# Patient Record
Sex: Male | Born: 1935 | Race: White | Hispanic: No | Marital: Married | Smoking: Former smoker
Health system: Southern US, Community
[De-identification: ages and names within clinical notes are randomized; demographics above are authoritative.]

## PROBLEM LIST (undated history)

## (undated) DIAGNOSIS — I350 Nonrheumatic aortic (valve) stenosis: Secondary | ICD-10-CM

## (undated) DIAGNOSIS — I2510 Atherosclerotic heart disease of native coronary artery without angina pectoris: Secondary | ICD-10-CM

## (undated) DIAGNOSIS — R011 Cardiac murmur, unspecified: Secondary | ICD-10-CM

## (undated) DIAGNOSIS — I10 Essential (primary) hypertension: Secondary | ICD-10-CM

## (undated) DIAGNOSIS — E785 Hyperlipidemia, unspecified: Secondary | ICD-10-CM

## (undated) DIAGNOSIS — I352 Nonrheumatic aortic (valve) stenosis with insufficiency: Secondary | ICD-10-CM

## (undated) DIAGNOSIS — R413 Other amnesia: Secondary | ICD-10-CM

## (undated) HISTORY — DX: Hyperlipidemia, unspecified: E78.5

## (undated) HISTORY — PX: CORONARY ANGIOPLASTY: SHX604

## (undated) HISTORY — DX: Cardiac murmur, unspecified: R01.1

## (undated) HISTORY — PX: CARDIAC CATHETERIZATION: SHX172

## (undated) HISTORY — DX: Nonrheumatic aortic (valve) stenosis with insufficiency: I35.2

## (undated) HISTORY — DX: Essential (primary) hypertension: I10

## (undated) HISTORY — PX: TONSILLECTOMY: SUR1361

## (undated) HISTORY — DX: Atherosclerotic heart disease of native coronary artery without angina pectoris: I25.10

## (undated) HISTORY — DX: Nonrheumatic aortic (valve) stenosis: I35.0

## (undated) HISTORY — DX: Other amnesia: R41.3

---

## 1998-03-23 ENCOUNTER — Encounter: Payer: Self-pay | Admitting: Emergency Medicine

## 1998-03-23 ENCOUNTER — Emergency Department (HOSPITAL_COMMUNITY): Admission: EM | Admit: 1998-03-23 | Discharge: 1998-03-23 | Payer: Self-pay | Admitting: Emergency Medicine

## 2002-07-22 ENCOUNTER — Encounter: Payer: Self-pay | Admitting: Orthopedic Surgery

## 2002-07-22 ENCOUNTER — Observation Stay (HOSPITAL_COMMUNITY): Admission: RE | Admit: 2002-07-22 | Discharge: 2002-07-23 | Payer: Self-pay | Admitting: Orthopedic Surgery

## 2002-09-04 ENCOUNTER — Ambulatory Visit (HOSPITAL_COMMUNITY): Admission: RE | Admit: 2002-09-04 | Discharge: 2002-09-04 | Payer: Self-pay | Admitting: Gastroenterology

## 2004-12-15 ENCOUNTER — Ambulatory Visit: Payer: Self-pay | Admitting: Pulmonary Disease

## 2004-12-30 ENCOUNTER — Ambulatory Visit: Payer: Self-pay | Admitting: Pulmonary Disease

## 2005-11-23 ENCOUNTER — Ambulatory Visit: Payer: Self-pay | Admitting: Pulmonary Disease

## 2007-02-13 ENCOUNTER — Encounter: Payer: Self-pay | Admitting: Pulmonary Disease

## 2007-05-23 ENCOUNTER — Encounter: Payer: Self-pay | Admitting: Pulmonary Disease

## 2007-08-13 ENCOUNTER — Encounter: Payer: Self-pay | Admitting: Pulmonary Disease

## 2007-10-17 ENCOUNTER — Encounter: Payer: Self-pay | Admitting: Pulmonary Disease

## 2007-11-21 ENCOUNTER — Encounter: Payer: Self-pay | Admitting: Pulmonary Disease

## 2008-02-11 ENCOUNTER — Encounter: Payer: Self-pay | Admitting: Pulmonary Disease

## 2008-02-14 ENCOUNTER — Encounter: Payer: Self-pay | Admitting: Pulmonary Disease

## 2008-02-27 ENCOUNTER — Ambulatory Visit: Payer: Self-pay | Admitting: Pulmonary Disease

## 2008-11-20 ENCOUNTER — Encounter: Admission: RE | Admit: 2008-11-20 | Discharge: 2008-11-20 | Payer: Self-pay | Admitting: Cardiology

## 2008-11-20 ENCOUNTER — Encounter: Payer: Self-pay | Admitting: Pulmonary Disease

## 2008-11-28 ENCOUNTER — Inpatient Hospital Stay (HOSPITAL_BASED_OUTPATIENT_CLINIC_OR_DEPARTMENT_OTHER): Admission: RE | Admit: 2008-11-28 | Discharge: 2008-11-28 | Payer: Self-pay | Admitting: Cardiology

## 2008-12-05 ENCOUNTER — Ambulatory Visit: Payer: Self-pay | Admitting: Thoracic Surgery (Cardiothoracic Vascular Surgery)

## 2008-12-15 ENCOUNTER — Ambulatory Visit: Payer: Self-pay | Admitting: Thoracic Surgery (Cardiothoracic Vascular Surgery)

## 2008-12-15 ENCOUNTER — Ambulatory Visit (HOSPITAL_COMMUNITY)
Admission: RE | Admit: 2008-12-15 | Discharge: 2008-12-15 | Payer: Self-pay | Admitting: Thoracic Surgery (Cardiothoracic Vascular Surgery)

## 2008-12-15 ENCOUNTER — Encounter: Payer: Self-pay | Admitting: Thoracic Surgery (Cardiothoracic Vascular Surgery)

## 2008-12-17 ENCOUNTER — Ambulatory Visit: Payer: Self-pay | Admitting: Thoracic Surgery (Cardiothoracic Vascular Surgery)

## 2008-12-17 ENCOUNTER — Encounter: Payer: Self-pay | Admitting: Thoracic Surgery (Cardiothoracic Vascular Surgery)

## 2008-12-17 ENCOUNTER — Inpatient Hospital Stay (HOSPITAL_COMMUNITY)
Admission: RE | Admit: 2008-12-17 | Discharge: 2008-12-21 | Payer: Self-pay | Admitting: Thoracic Surgery (Cardiothoracic Vascular Surgery)

## 2008-12-17 HISTORY — PX: AORTIC VALVE REPLACEMENT: SHX41

## 2009-01-12 ENCOUNTER — Encounter
Admission: RE | Admit: 2009-01-12 | Discharge: 2009-01-12 | Payer: Self-pay | Admitting: Thoracic Surgery (Cardiothoracic Vascular Surgery)

## 2009-01-12 ENCOUNTER — Ambulatory Visit: Payer: Self-pay | Admitting: Thoracic Surgery (Cardiothoracic Vascular Surgery)

## 2009-01-31 ENCOUNTER — Encounter (HOSPITAL_COMMUNITY): Admission: RE | Admit: 2009-01-31 | Discharge: 2009-05-01 | Payer: Self-pay | Admitting: Cardiology

## 2009-02-02 ENCOUNTER — Encounter: Payer: Self-pay | Admitting: Pulmonary Disease

## 2009-03-05 ENCOUNTER — Encounter: Payer: Self-pay | Admitting: Pulmonary Disease

## 2009-05-02 ENCOUNTER — Encounter (HOSPITAL_COMMUNITY): Admission: RE | Admit: 2009-05-02 | Discharge: 2009-06-01 | Payer: Self-pay | Admitting: Cardiology

## 2009-05-11 ENCOUNTER — Encounter: Payer: Self-pay | Admitting: Pulmonary Disease

## 2009-05-25 ENCOUNTER — Ambulatory Visit: Payer: Self-pay | Admitting: Thoracic Surgery (Cardiothoracic Vascular Surgery)

## 2009-05-25 ENCOUNTER — Encounter: Payer: Self-pay | Admitting: Pulmonary Disease

## 2009-05-25 ENCOUNTER — Encounter
Admission: RE | Admit: 2009-05-25 | Discharge: 2009-05-25 | Payer: Self-pay | Admitting: Thoracic Surgery (Cardiothoracic Vascular Surgery)

## 2009-07-28 ENCOUNTER — Encounter: Payer: Self-pay | Admitting: Pulmonary Disease

## 2009-12-04 ENCOUNTER — Ambulatory Visit: Payer: Self-pay | Admitting: Cardiology

## 2009-12-14 ENCOUNTER — Ambulatory Visit: Payer: Self-pay | Admitting: Cardiology

## 2010-03-04 ENCOUNTER — Encounter: Payer: Self-pay | Admitting: Pulmonary Disease

## 2010-03-04 NOTE — Letter (Signed)
Summary: [facility]  [facility]   Imported By: Sherian Rein 02/11/2009 14:51:24  _____________________________________________________________________  External Attachment:    Type:   Image     Comment:   External Document

## 2010-03-04 NOTE — Letter (Signed)
Summary: [facility]   Imported By: Lennie Odor 08/06/2009 14:48:40  _____________________________________________________________________  External Attachment:    Type:   Image     Comment:   External Document

## 2010-03-04 NOTE — Letter (Signed)
Summary: [facility]   Imported By: Lennie Odor 05/26/2009 14:41:20  _____________________________________________________________________  External Attachment:    Type:   Image     Comment:   External Document

## 2010-03-04 NOTE — Letter (Signed)
Summary: [facility]   Imported By: Sherian Rein 03/13/2009 12:36:04  _____________________________________________________________________  External Attachment:    Type:   Image     Comment:   External Document

## 2010-03-04 NOTE — Letter (Signed)
Summary: [facility]  [facility]   Imported By: Sherian Rein 06/05/2009 08:24:36  _____________________________________________________________________  External Attachment:    Type:   Image     Comment:   External Document

## 2010-03-24 NOTE — Consult Note (Signed)
Summary: [facility]   Imported By: Sherian Rein 03/15/2010 14:21:42  _____________________________________________________________________  External Attachment:    Type:   Image     Comment:   External Document

## 2010-03-24 NOTE — Letter (Signed)
Summary: [facility]   Imported By: Sherian Rein 03/15/2010 14:06:40  _____________________________________________________________________  External Attachment:    Type:   Image     Comment:   External Document

## 2010-04-22 ENCOUNTER — Other Ambulatory Visit: Payer: Self-pay | Admitting: *Deleted

## 2010-04-22 DIAGNOSIS — I350 Nonrheumatic aortic (valve) stenosis: Secondary | ICD-10-CM

## 2010-04-22 MED ORDER — METOPROLOL TARTRATE 25 MG PO TABS: ORAL_TABLET | ORAL | Status: DC

## 2010-04-22 NOTE — Telephone Encounter (Signed)
Refilled meds per fax request.  

## 2010-05-05 LAB — CBC
HCT: 25.2 % — ABNORMAL LOW (ref 39.0–52.0)
HCT: 31.8 % — ABNORMAL LOW (ref 39.0–52.0)
Hemoglobin: 11.1 g/dL — ABNORMAL LOW (ref 13.0–17.0)
Hemoglobin: 8.8 g/dL — ABNORMAL LOW (ref 13.0–17.0)
Hemoglobin: 9 g/dL — ABNORMAL LOW (ref 13.0–17.0)
MCHC: 34.7 g/dL (ref 30.0–36.0)
MCHC: 35 g/dL (ref 30.0–36.0)
MCHC: 35.3 g/dL (ref 30.0–36.0)
MCV: 100 fL (ref 78.0–100.0)
MCV: 98.2 fL (ref 78.0–100.0)
MCV: 98.3 fL (ref 78.0–100.0)
MCV: 98.4 fL (ref 78.0–100.0)
Platelets: 139 10*3/uL — ABNORMAL LOW (ref 150–400)
Platelets: 92 10*3/uL — ABNORMAL LOW (ref 150–400)
Platelets: 132 10*3/uL — ABNORMAL LOW (ref 150–400)
RBC: 2.52 MIL/uL — ABNORMAL LOW (ref 4.22–5.81)
RBC: 2.59 MIL/uL — ABNORMAL LOW (ref 4.22–5.81)
RBC: 2.77 MIL/uL — ABNORMAL LOW (ref 4.22–5.81)
RBC: 3.23 MIL/uL — ABNORMAL LOW (ref 4.22–5.81)
RDW: 12.8 % (ref 11.5–15.5)
RDW: 13.1 % (ref 11.5–15.5)
RDW: 13.4 % (ref 11.5–15.5)
RDW: 13.6 % (ref 11.5–15.5)
RDW: 12.8 % (ref 11.5–15.5)
WBC: 11.5 10*3/uL — ABNORMAL HIGH (ref 4.0–10.5)
WBC: 12.7 10*3/uL — ABNORMAL HIGH (ref 4.0–10.5)
WBC: 9.5 10*3/uL (ref 4.0–10.5)
WBC: 8 10*3/uL (ref 4.0–10.5)

## 2010-05-05 LAB — TYPE AND SCREEN: Antibody Screen: NEGATIVE

## 2010-05-05 LAB — MAGNESIUM: Magnesium: 2.6 mg/dL — ABNORMAL HIGH (ref 1.5–2.5)

## 2010-05-05 LAB — POCT I-STAT 4, (NA,K, GLUC, HGB,HCT)
Glucose, Bld: 109 mg/dL — ABNORMAL HIGH (ref 70–99)
Glucose, Bld: 119 mg/dL — ABNORMAL HIGH (ref 70–99)
Glucose, Bld: 99 mg/dL (ref 70–99)
HCT: 31 % — ABNORMAL LOW (ref 39.0–52.0)
Hemoglobin: 11.2 g/dL — ABNORMAL LOW (ref 13.0–17.0)
Hemoglobin: 7.8 g/dL — ABNORMAL LOW (ref 13.0–17.0)
Hemoglobin: 9.5 g/dL — ABNORMAL LOW (ref 13.0–17.0)
Potassium: 3.2 mEq/L — ABNORMAL LOW (ref 3.5–5.1)
Potassium: 4 mEq/L (ref 3.5–5.1)
Potassium: 5.9 mEq/L — ABNORMAL HIGH (ref 3.5–5.1)
Sodium: 134 mEq/L — ABNORMAL LOW (ref 135–145)
Sodium: 134 mEq/L — ABNORMAL LOW (ref 135–145)
Sodium: 140 mEq/L (ref 135–145)
Sodium: 141 mEq/L (ref 135–145)
Sodium: 143 mEq/L (ref 135–145)

## 2010-05-05 LAB — BLOOD GAS, ARTERIAL
Drawn by: 313941
FIO2: 0.21 %
Patient temperature: 98.6
TCO2: 28.6 mmol/L (ref 0–100)
pCO2 arterial: 42.8 mmHg (ref 35.0–45.0)
pH, Arterial: 7.42 (ref 7.350–7.450)

## 2010-05-05 LAB — BASIC METABOLIC PANEL
BUN: 18 mg/dL (ref 6–23)
CO2: 29 mEq/L (ref 19–32)
Calcium: 8.2 mg/dL — ABNORMAL LOW (ref 8.4–10.5)
Calcium: 8.5 mg/dL (ref 8.4–10.5)
Calcium: 8.6 mg/dL (ref 8.4–10.5)
Chloride: 112 mEq/L (ref 96–112)
Creatinine, Ser: 0.96 mg/dL (ref 0.4–1.5)
Creatinine, Ser: 1.11 mg/dL (ref 0.4–1.5)
Creatinine, Ser: 1.17 mg/dL (ref 0.4–1.5)
GFR calc Af Amer: >60 mL/min (ref >60)
GFR calc Af Amer: >60 mL/min (ref >60)
GFR calc non Af Amer: >60 mL/min (ref >60)
GFR calc non Af Amer: >60 mL/min (ref >60)
Glucose, Bld: 142 mg/dL — ABNORMAL HIGH (ref 70–99)
Sodium: 137 mEq/L (ref 135–145)

## 2010-05-05 LAB — POCT I-STAT 3, ART BLOOD GAS (G3+)
Acid-Base Excess: 1 mmol/L (ref 0.0–2.0)
Acid-Base Excess: 4 mmol/L — ABNORMAL HIGH (ref 0.0–2.0)
Bicarbonate: 24.2 mEq/L — ABNORMAL HIGH (ref 20.0–24.0)
Bicarbonate: 24.8 mEq/L — ABNORMAL HIGH (ref 20.0–24.0)
Bicarbonate: 28.4 mEq/L — ABNORMAL HIGH (ref 20.0–24.0)
O2 Saturation: 100 %
O2 Saturation: 100 %
Patient temperature: 34.3
TCO2: 25 mmol/L (ref 0–100)
TCO2: 26 mmol/L (ref 0–100)
TCO2: 30 mmol/L (ref 0–100)
pCO2 arterial: 34 mmHg — ABNORMAL LOW (ref 35.0–45.0)
pCO2 arterial: 42.2 mmHg (ref 35.0–45.0)
pH, Arterial: 7.371 (ref 7.350–7.450)
pH, Arterial: 7.494 — ABNORMAL HIGH (ref 7.350–7.450)

## 2010-05-05 LAB — PROTIME-INR
Prothrombin Time: 17.8 seconds — ABNORMAL HIGH (ref 11.6–15.2)
Prothrombin Time: 13.7 seconds (ref 11.6–15.2)

## 2010-05-05 LAB — GLUCOSE, CAPILLARY
Glucose-Capillary: 111 mg/dL — ABNORMAL HIGH (ref 70–99)
Glucose-Capillary: 124 mg/dL — ABNORMAL HIGH (ref 70–99)
Glucose-Capillary: 125 mg/dL — ABNORMAL HIGH (ref 70–99)
Glucose-Capillary: 93 mg/dL (ref 70–99)

## 2010-05-05 LAB — URINALYSIS, ROUTINE W REFLEX MICROSCOPIC
Glucose, UA: NEGATIVE mg/dL
Protein, ur: NEGATIVE mg/dL
Specific Gravity, Urine: 1.008 (ref 1.005–1.030)
Urobilinogen, UA: 0.2 mg/dL (ref 0.0–1.0)

## 2010-05-05 LAB — PREPARE FRESH FROZEN PLASMA

## 2010-05-05 LAB — COMPREHENSIVE METABOLIC PANEL
AST: 23 U/L (ref 0–37)
Albumin: 4 g/dL (ref 3.5–5.2)
Alkaline Phosphatase: 57 U/L (ref 39–117)
BUN: 14 mg/dL (ref 6–23)
Chloride: 105 mEq/L (ref 96–112)
GFR calc Af Amer: >60 mL/min (ref >60)
Potassium: 4.2 mEq/L (ref 3.5–5.1)
Sodium: 137 mEq/L (ref 135–145)
Total Bilirubin: 0.9 mg/dL (ref 0.3–1.2)
Total Protein: 6.3 g/dL (ref 6.0–8.3)

## 2010-05-05 LAB — POCT I-STAT, CHEM 8
Creatinine, Ser: 0.9 mg/dL (ref 0.4–1.5)
Hemoglobin: 10.2 g/dL — ABNORMAL LOW (ref 13.0–17.0)
Potassium: 4.5 mEq/L (ref 3.5–5.1)
Sodium: 141 mEq/L (ref 135–145)

## 2010-05-05 LAB — ABO/RH: ABO/RH(D): O POS

## 2010-05-05 LAB — HEMOGLOBIN AND HEMATOCRIT, BLOOD: HCT: 24.8 % — ABNORMAL LOW (ref 39.0–52.0)

## 2010-05-05 LAB — PREPARE PLATELETS

## 2010-05-05 LAB — APTT: aPTT: 45 seconds — ABNORMAL HIGH (ref 24–37)

## 2010-05-05 LAB — PLATELET COUNT: Platelets: 90 10*3/uL — ABNORMAL LOW (ref 150–400)

## 2010-05-06 LAB — POCT I-STAT 3, VENOUS BLOOD GAS (G3P V)
Bicarbonate: 27.1 mEq/L — ABNORMAL HIGH (ref 20.0–24.0)
pH, Ven: 7.377 — ABNORMAL HIGH (ref 7.250–7.300)
pO2, Ven: 38 mmHg (ref 30.0–45.0)

## 2010-05-06 LAB — POCT I-STAT 3, ART BLOOD GAS (G3+)
Acid-Base Excess: 4 mmol/L — ABNORMAL HIGH (ref 0.0–2.0)
pH, Arterial: 7.428 (ref 7.350–7.450)

## 2010-05-31 ENCOUNTER — Other Ambulatory Visit: Payer: Self-pay | Admitting: Dermatology

## 2010-06-14 ENCOUNTER — Other Ambulatory Visit: Payer: Self-pay | Admitting: Cardiology

## 2010-06-14 DIAGNOSIS — E7800 Pure hypercholesterolemia, unspecified: Secondary | ICD-10-CM

## 2010-06-14 NOTE — Telephone Encounter (Signed)
escribe request  

## 2010-06-15 NOTE — Assessment & Plan Note (Signed)
OFFICE VISIT   Marvin Wagner, Marvin Wagner  DOB:  Mar 01, 1930                                        May 25, 2009  CHART #:  [id number]   HISTORY:  The patient returns to the office today for follow up status  post aortic valve replacement and coronary artery bypass grafting x2 on  December 17, 2008.  He was last seen here in our office on January 12, 2009.  Since then, he has continued to do remarkably well.  He has  completed the cardiac rehab program and reports excellent progression in  his exercise tolerance.  He states that he still does not feel quite as  strong as he did protect, particularly with respect to upper body  strength, but otherwise he feels terrific and is doing very well.  He  has no limitations whatsoever.  He has never had much trouble with any  pain in his chest, and he is back now enjoying essentially normal  physical activity.  He continues to follow up routinely with Dr.  Patty Sermons and he is scheduled to see him later this summer, at which  time a followup echocardiogram has been scheduled.  He has no  complaints.  The remainder of his review of systems is unremarkable and  notable for the absence of any exertional shortness of breath, chest  pain, tachy palpitations, or dizzy spells.  The remainder of his past  medical history is unchanged.   CURRENT MEDICATIONS:  Aspirin, Lipitor, metoprolol, multivitamin,  calcium and magnesium supplement, vitamin D, vitamin C.   PHYSICAL EXAMINATION:  Notable for a well-appearing gentleman with blood  pressure 115/62, pulse 59 and regular, oxygen saturation 95% on room  air.  Examination of the chest reveals median sternotomy scar that has  healed completely.  The sternum is stable on palpation.  Auscultation  reveals clear breath sounds that are symmetrical bilaterally.  No  wheezes, rales, or rhonchi are noted.  Cardiovascular exam is notable  for regular rate and rhythm.  No murmurs, rubs, or  gallops are  appreciated.  The abdomen is soft, nontender.  The extremities are warm  and well perfused.  There is no lower extremity edema.   IMPRESSION:  The patient had done quite well following aortic valve  replacement and coronary artery bypass grafting.   PLAN:  In the future, the patient will call and return to see [place] as  needed.  All of his questions have been addressed.   Salvatore Decent. Cornelius Moras, M.D.  Electronically Signed   CHO/MEDQ  D:  05/25/2009  T:  05/26/2009  Job:  [id number]   cc:   Cassell Clement, M.D.  Peter M. Swaziland, M.D.  Lonzo Cloud. Kriste Basque, MD

## 2010-06-15 NOTE — Assessment & Plan Note (Signed)
OFFICE VISIT   HAYVEN, FATIMA  DOB:  [birth year]                                        December 15, 2008  CHART #:  [id number]   The patient returns to the office today for further followup with  tentative plans to proceed with elective aortic valve replacement and  coronary artery bypass grafting on Wednesday, December 17, 2008.  He was  originally seen in consultation on December 05, 2008, and a full history  and physical exam and consultation report were dictated at that time.  Since then, he has remained entirely stable clinically and he is now  eager to proceed with surgery.  We again reviewed the indications,  risks, and potential benefits of surgery.  All of his questions have  been addressed.  We plan for surgery first case Wednesday morning.   Salvatore Decent. Cornelius Moras, M.D.  Electronically Signed   CHO/MEDQ  D:  12/15/2008  T:  12/16/2008  Job:  [id number]

## 2010-06-15 NOTE — H&P (Signed)
HISTORY AND PHYSICAL EXAMINATION   December 05, 2008   Re:  Marvin Wagner, Marvin Wagner           DOB:  February 09, 1930   REASON FOR CONSULTATION:  Severe aortic stenosis.   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  He states  that a heart murmur was first noted on physical exam in 1986 when he  underwent routine physical exam at the time of his discharge from the [employer].  Ultimately, he relocated to [place], [place], and  for the last 12 years or so he has been followed by Dr. Ronny Flurry.  The patient has remained otherwise remarkably healthy and physically  active all of his life.  He does admit that over the last year or so he  may have noticed a very slight decrease in his exercise tolerance with  tendency towards mild exertional shortness of breath when he pushes  himself strenuously.  Approximately 2 months ago, while exercising on a  stationary bike, he states that his pulse rate became rapid and  persisted for several hours, to the point where he felt poorly and tired  the rest of the day.  He returned to see Dr. Patty Sermons in followup and  underwent a repeat 2-D echocardiogram on November 20, 2008.  This reveals  severe aortic stenosis with peak and mean transvalvular gradients  estimated 140 and 88 mmHg respectively.  The peak velocity across the  valve was measured 5.92 meters per second with estimated aortic valve  area 0.63 cm2.  There remained normal left ventricular systolic function  with moderate left ventricular hypertrophy.  There was moderate aortic  regurgitation.  During the last year, there was significant increase in  the peak velocity across the aortic valve appreciated.  The patient  subsequently underwent elective left and right heart catheterization by  Dr. Peter Swaziland on November 28, 2008.  This confirmed the presence of  severe aortic stenosis  with mean and peak-to-peak gradient across the  valve measured 58 and 82 mmHg respectively.  The aortic valve area was  estimated 0.42 cm2 based upon thermodilution cardiac output of 2.7 L per  minute.  Cardiac output was estimated 5.2 L per minute using the Fick  method, and aortic valve area was subsequently estimated 0.8 cm2.  Coronary arteriography demonstrated the presence of significant single-  vessel coronary artery disease with 75% stenosis of the proximal left  anterior descending coronary artery involving bifurcation with a large  diagonal branch.  There is normal left ventricular systolic function.  Pulmonary artery pressures were measured 19/6 with pulmonary capillary  wedge pressure 7.  The patient has been referred to consider elective  aortic valve replacement and coronary artery bypass grafting.   REVIEW OF SYSTEMS:  GENERAL:  The patient reports normal appetite.  He  has not been gaining nor losing weight recently.  VITAL SIGNS:  He is 5 feet 10 inches tall and weighs approximately 162  pounds.  CARDIAC:  The patient describes any history of chest pain, chest  tightness, chest pressure either with activity or at rest.  The patient  exercises regularly and has really only noticed a very slight decrease  in his exercise tolerance over the last year.  He denies PND, orthopnea,  or lower extremity edema.  He has not had palpitations or syncope.  RESPIRATORY:  Negative.  The patient denies productive cough,  hemoptysis, or wheezing.  GASTROINTESTINAL:  Negative.  The patient has no difficulty swallowing.  He denies hematochezia, hematemesis, or melena. Bowel function is  regular.  GENITOURINARY:  Negative.  MUSCULOSKELETAL:  Negative.  NEUROLOGIC:  Negative.  HEENT:  Negative.  The patient has good dentition and sees his dentist  on a regular basis.  HEMATOLOGIC:  Negative.   PAST MEDICAL HISTORY:  1. Aortic stenosis.  2. Paroxysmal supraventricular tachycardia.  3.  Diverticulosis.  4. Mitral valve prolapse.  5. Spinal stenosis.   PAST SURGICAL HISTORY:  1. Tonsillectomy at age 40.  2. Back surgery for ruptured disk, L4-5 level in 2004.   FAMILY HISTORY:  Noncontributory.   SOCIAL HISTORY:  The patient is married and lives with his wife here in  [place].  They have 2 grown children and several grandchildren.  The  patient has a remote history of tobacco use, but he quit smoking in  1962.  He denies alcohol consumption.  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  He remains quite active physically and has no  significant limitations.   CURRENT MEDICATIONS:  1. Multivitamin daily.  2. Aspirin 81 mg daily.  3. Lipitor 10 mg daily.  4. Calcium 2 tablets daily.  5. Vitamin D 2000 units daily.  6. Vitamin C 500 mg daily.  7. Psyllium fiber tablets daily.   DRUG ALLERGIES:  Penicillin.   PHYSICAL EXAMINATION:  General:  The patient is a well appearing, thin,  white male who appears somewhat younger than stated age, in no acute  distress.  Vital Signs:  Blood pressure 127/64, pulse 66, and oxygen  saturation 98% on room air.  HEENT:  Unrevealing.  Neck:  Supple.  There  is no cervical nor supraclavicular lymphadenopathy.  There is no jugular  venous distention.  No carotid bruits noted.  Auscultation of the chest  demonstrates clear breath sounds which are symmetrical bilaterally.  No  wheezes or rhonchi are noted.  Cardiovascular:  Notable for regular rate  and rhythm.  There is crescendo-decrescendo grade 4/6 systolic murmur  heard best along the sternal border with radiation up towards the neck  and all across the precordium.  No diastolic murmurs are noted.  Abdomen:  Soft, nondistended, and nontender.  Bowel sounds are present.  Extremities: Warm and well perfused.  There is no lower extremity edema.  There is no sign of venous insufficiency.  Pulses are intact and  palpable bilaterally.  Rectal and GU:  Both deferred.  Neurologic:   Grossly nonfocal and symmetrical throughout.   DIAGNOSTIC TESTS:  Report of 2-D echocardiogram performed at [facility] on November 20, 2008 is reviewed.  By report, this  exam demonstrated the presence of severe aortic stenosis with a peak  velocity across the valve measured 5.92 meters per second.  The peak and  mean transvalvular gradients were 140 and 88 mmHg respectively.  There  is normal left ventricular systolic function with moderate left  ventricular hypertrophy.  There is evidence for only trace mitral  regurgitation.  No other significant abnormalities are noted other than  moderate aortic insufficiency.   Left and right heart catheterization performed by Dr. Peter Swaziland on  November 28, 2008, is reviewed.  This confirms the presence of severe  aortic stenosis with hemodynamic data as discussed previously.  In  addition, there is single-vessel coronary artery disease with a shelf-  like plaque and 70-80% stenosis of the proximal left anterior descending  coronary  artery arriving at takeoff of a large diagonal branch.  There  are otherwise no significant flow-limiting lesions in the coronary  arteries.  There is codominant coronary circulation.  Left ventricular  systolic function appears normal.  There is some mitral regurgitation on  left ventricular ejection.   IMPRESSION:  Severe aortic stenosis with single-vessel coronary artery  disease.  The patient has had very few symptoms, although he does  describe mild exertional fatigue and shortness of breath that has  developed over the last year.  In addition, he had an episode of  prolonged tachy palpitation associated with feeling poorly a couple of  months ago.  By both echo and cath, he has extremely severe aortic  stenosis.  I think it makes sense to proceed with surgery at this  juncture.   PLAN:  I have discussed the options at length with the patient and his  wife here in the office today.   Alternative treatment strategies have  been discussed.  The rationale for proceeding with surgery at this  juncture versus continuing medical therapy has been discussed in detail.  Because of the presence of significant coronary artery disease, I do not  favor use of minimally invasive approach for his surgery.  We have also  discussed alternatives with respect to replacing his aortic valve, and  after considerable discussion, the patient agrees that he would probably  best be treated with bioprosthetic tissue valve.  He understands that  this will come with a small risk for late structural valve deterioration  and failure depending upon his longevity.  However, based upon his age,  I suspect that a tissue valve clearly makes a sense under the  circumstances.  All of his questions have been addressed.  They  understand and accept all potential associated risks of surgery  including, but not limited to risk of death, stroke, myocardial  infarction, congestive heart failure, respiratory failure, pneumonia,  bleeding requiring blood transfusion, arrhythmia, heart block with  bradycardia requiring permanent pacemaker, late complications related to  valve replacement, and late recurrence of coronary artery disease.  All  of his questions have been addressed.  We tentatively plan to proceed  with surgery on Wednesday, December 17, 2008.  The patient and his wife  will return for further followup in consultation on Monday, December 15, 2008 prior to surgery.   Salvatore Decent. Cornelius Moras, M.D.  Electronically Signed   CHO/MEDQ  D:  12/05/2008  T:  12/06/2008  Job:  [id number]   cc:   Cassell Clement, M.D.  Peter M. Swaziland, M.D.

## 2010-06-15 NOTE — Assessment & Plan Note (Signed)
OFFICE VISIT   Marvin Wagner, Marvin Wagner  DOB:  [birth year]                                        January 12, 2009  CHART #:  [id number]   HISTORY:  The patient is a 77 year old gentleman who is seen in routine  office visit following his aortic valve replacement and coronary artery  bypass grafting x2 by Dr. Cornelius Moras on December 17, 2008, for severe aortic  stenosis as well as single-vessel coronary artery disease.  Currently,  he reports that he is overall feeling well.  He does have some  difficulty with his appetite, but he states his weight is holding steady  and he is using some nutritional supplements.  Mostly, he just feels  that things do not taste very good.  He denies shortness of breath or  chest pain.  He denies dizziness or syncopal episodes.  He has been  increasing his activity, primarily walking around the house.  He is  tentatively scheduled to start the cardiac rehab program soon, but he is  uncertain if you wants to do this.  He will go to their orientation this  week.  Overall, he feels as though his progress has been pretty steady.   DIAGNOSTIC TESTS:  Chest x-ray was obtained on today's date; however,  the computer system is having some difficulty and I am currently unable  to evaluate this.   PHYSICAL EXAMINATION:  Vital Signs:  Blood pressure 127/78, pulse 92,  respirations 18, oxygen saturation is 97% on room air.  General:  Well-  developed adult male in no acute distress.  He appears younger than his  stated age.  Pulmonary:  Clear lungs throughout.  Cardiac:  Regular rate  and rhythm.  Soft systolic aortic flow murmur.  Extremities:  No edema.  Incisions are all healing well without evidence of infection.   ASSESSMENT:  The patient was making excellent ongoing progress.  We  discussed resumption of driving and parameters for this.  I also  encouraged him to attend the cardiac rehab program  and he is considering this as described above.   We will see him again in  the office in 4 months and p.r.n.   Rowe Clack, P.A.-C.   Marvin Wagner  D:  01/12/2009  T:  01/13/2009  Job:  [id number]   cc:   Cassell Clement, M.D.  Peter M. Swaziland, M.D.

## 2010-06-18 NOTE — Op Note (Signed)
NAME:  BRENNER, VISCONTI                           ACCOUNT NO.:  [id number]   MEDICAL RECORD NO.:  [id number]                   PATIENT TYPE:  AMB   LOCATION:  DAY                                  FACILITY:  [facility]   PHYSICIAN:  Georges Lynch. Gioffre, M.D.             DATE OF BIRTH:  12-23-1930   DATE OF PROCEDURE:  07/22/2002  DATE OF DISCHARGE:                                 OPERATIVE REPORT   Mr. Anglin is entered with a chief complaint of pain in his anterior right  thigh and weakness of his hip flexors on the right.   PREOPERATIVE DIAGNOSES:  1. Spinal stenosis, L4-5 on the right.  2. Herniated disk at L4-5 on the right with a foraminal component.   POSTOPERATIVE DIAGNOSES:  1. Spinal stenosis, L4-5 on the right.  2. Herniated disk at L4-5 on the right with a foraminal component.   OPERATION:  1. Hemilaminectomy and facetectomy at L4-5 on the right.  2. Microdiskectomy at L4-5 on the right and decompression of the lateral     recess as well.   SURGEON:  Georges Lynch. Darrelyn Hillock, M.D.   ASSISTANT:  Ronnell Guadalajara, M.D.   PROCEDURE:  Under general anesthesia with the patient in a spinal frame, a  sterile prep and draping of the back carried out.  An x-ray was taken to  verify our position.  We had two needles in place.  Once we identified the  L4-5 space, an incision was made on the right paralumbar region.  The  incision was carried down to the lamina between L4 and L5, bleeders  identified and cauterized.  I then utilized the bur to bur down the inferior  part of the lamina of L4 because of the severe growth and shingling effect  of the lamina.  We took another x-ray prior to doing that to verify our  space.  Once this was done we went out and burred out laterally because the  disk was herniated out into the foramen.  He had such a marked overgrowth of  bone and stenosis that we had to utilize the bur to go out and do a partial  facetectomy as well.  Once we were out laterally, we  cauterized lateral  recess veins.  Great care was taken not to injure the L4-5 root.  We had a  nice decompression of the area, and the D'Errico retractor was used to  gently retract the dura.  We identified the root above.  We made sure we  were free from the root below as well.  A cruciate incision was made in the  posterior longitudinal ligament.  We did a complete diskectomy.  We then  went out far laterally because of the foraminal component and utilized a  nerve hook to tease another piece of disk material out from the foramen.  We  thoroughly decompressed the root.  We did a  good foraminotomy as well and we  had good motion of the nerve roots.  Thoroughly irrigated out the area and  some thrombin-soaked Gelfoam was loosely applied for hemostasis purposes.  We had good control of the bleeding.  I then closed the wound in layers in  the  usual fashion, but the superior and inferior part of the deep portions of  the wound were not closed tightly so that if we had any bleeding, we would  have good drainage.  The skin was closed with metal staples.  A sterile  Neosporin dressing was applied.  He had 1 g of IV Ancef preop.                                               Ronald A. Darrelyn Hillock, M.D.    RAG/MEDQ  D:  07/22/2002  T:  07/23/2002  Job:  [id number]

## 2010-06-25 ENCOUNTER — Other Ambulatory Visit: Payer: Self-pay | Admitting: *Deleted

## 2010-06-29 ENCOUNTER — Other Ambulatory Visit: Payer: Self-pay | Admitting: *Deleted

## 2010-06-29 DIAGNOSIS — E7800 Pure hypercholesterolemia, unspecified: Secondary | ICD-10-CM

## 2010-06-30 ENCOUNTER — Encounter: Payer: Self-pay | Admitting: Cardiology

## 2010-06-30 ENCOUNTER — Other Ambulatory Visit (INDEPENDENT_AMBULATORY_CARE_PROVIDER_SITE_OTHER): Payer: Medicare Other | Admitting: *Deleted

## 2010-06-30 DIAGNOSIS — E7800 Pure hypercholesterolemia, unspecified: Secondary | ICD-10-CM

## 2010-06-30 LAB — BASIC METABOLIC PANEL
CO2: 27 mEq/L (ref 19–32)
Chloride: 103 mEq/L (ref 96–112)
Creatinine, Ser: 1 mg/dL (ref 0.4–1.5)
Potassium: 4.4 mEq/L (ref 3.5–5.1)
Sodium: 138 mEq/L (ref 135–145)

## 2010-06-30 LAB — LIPID PANEL
LDL Cholesterol: 42 mg/dL (ref 0–99)
Total CHOL/HDL Ratio: 2
Triglycerides: 56 mg/dL (ref 0.0–149.0)

## 2010-06-30 LAB — HEPATIC FUNCTION PANEL
ALT: 22 U/L (ref 0–53)
Alkaline Phosphatase: 46 U/L (ref 39–117)
Bilirubin, Direct: 0.3 mg/dL (ref 0.0–0.3)
Total Bilirubin: 0.8 mg/dL (ref 0.3–1.2)
Total Protein: 6.4 g/dL (ref 6.0–8.3)

## 2010-07-01 ENCOUNTER — Ambulatory Visit (INDEPENDENT_AMBULATORY_CARE_PROVIDER_SITE_OTHER): Payer: Medicare Other | Admitting: Cardiology

## 2010-07-01 ENCOUNTER — Encounter: Payer: Self-pay | Admitting: Cardiology

## 2010-07-01 DIAGNOSIS — T8201XA Breakdown (mechanical) of heart valve prosthesis, initial encounter: Secondary | ICD-10-CM

## 2010-07-01 DIAGNOSIS — E785 Hyperlipidemia, unspecified: Secondary | ICD-10-CM

## 2010-07-01 DIAGNOSIS — I119 Hypertensive heart disease without heart failure: Secondary | ICD-10-CM

## 2010-07-01 DIAGNOSIS — T8209XA Other mechanical complication of heart valve prosthesis, initial encounter: Secondary | ICD-10-CM

## 2010-07-01 DIAGNOSIS — Z951 Presence of aortocoronary bypass graft: Secondary | ICD-10-CM | POA: Insufficient documentation

## 2010-07-01 DIAGNOSIS — Z9889 Other specified postprocedural states: Secondary | ICD-10-CM

## 2010-07-01 NOTE — Progress Notes (Signed)
Unknown Marvin Wagner Date of Birth:  [birth year] [facility] [address]   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  He has been feeling well since last visit.  He has a previous history of severe aortic stenosis and on 12/17/08 underwent aortic valve replacement using a #25 Edwards pericardial tissue valve.  His surgeon is Dr. Cornelius Moras.  The patient also underwent single vessel coronary bypass graft surgery at the time of his aortic valve replacement postoperatively he finished out the [facility] cardiac rehabilitation program and now exercises faithfully at home uses a stationary bike as well as free weights.  He's had no palpitations no chest pain no shortness of breath no dizziness or syncope.  A post operative echocardiogram done on 07/28/09 showed normal aortic valve prosthetic function with trivial aortic insufficiency and showed moderate LVH with normal systolic and diastolic function and normal pulmonary artery pressure.  His ejection fraction was 55-60%.  Current Outpatient Prescriptions  Medication Sig Dispense Refill  . acetaminophen (TYLENOL) 500 MG tablet Take 500 mg by mouth every 6 (six) hours as needed.        Marland Kitchen aspirin 325 MG tablet Take 325 mg by mouth daily.        . calcium carbonate (OS-CAL) 600 MG TABS Take 600 mg by mouth 2 (two) times daily with a meal.        . Cholecalciferol (VITAMIN D) 2000 UNITS tablet Take 2,000 Units by mouth daily.        Marland Kitchen FIBER PO Take by mouth 2 (two) times daily.        . IRON PO Take by mouth every other day.        Marland Kitchen LIPITOR 10 MG tablet TAKE 1 TABLET BY MOUTH DAILY  90 tablet  2  . metoprolol tartrate (LOPRESSOR) 25 MG tablet Take 1/2 tab by mouth daily   90 tablet  3  . Multiple Vitamin (MULTIVITAMIN) tablet Take 1 tablet by mouth daily.        . Protein POWD  Take by mouth daily.        . vitamin C (ASCORBIC ACID) 500 MG tablet Take 500 mg by mouth daily.          Allergies  Allergen Reactions  . Penicillins     Patient Active Problem List  Diagnoses  . Prosthetic heart valve failure  . Hx of CABG  . Hyperlipidemia  . Benign hypertensive heart disease without heart failure    History  Smoking status  . Former Smoker  . Quit date: 06/30/2006  Smokeless tobacco  . Not on file    History  Alcohol Use No    No family history on file.  Review of Systems: Constitutional: no fever chills diaphoresis or fatigue or change in weight.  Head and neck: no hearing loss, no epistaxis, no photophobia or visual disturbance. Respiratory: No cough, shortness of breath or wheezing. Cardiovascular: No chest pain peripheral edema, palpitations. Gastrointestinal: No abdominal distention, no abdominal pain, no change in bowel habits hematochezia or melena. Genitourinary: No dysuria, no frequency, no urgency, no nocturia. Musculoskeletal:No arthralgias, no back pain, no gait disturbance or myalgias. Neurological: No dizziness, no headaches, no numbness, no seizures, no syncope, no weakness, no tremors. Hematologic: No lymphadenopathy, no easy bruising. Psychiatric:  No confusion, no hallucinations, no sleep disturbance.    Physical Exam: Filed Vitals:   07/01/10 1025  BP: 120/70  Pulse: 52  The general appearance reveals a tall erect gentleman in no distress.Pupils equal and reactive.   Extraocular Movements are full.  There is no scleral icterus.  The mouth and pharynx are normal.  The neck is supple.  The carotids reveal no bruits.  The jugular venous pressure is normal.  The thyroid is not enlarged.  There is no lymphadenopathy.The chest is clear to percussion and auscultation. There are no rales or rhonchi. Expansion of the chest is symmetrical.The heart reveals a soft systolic flow murmur across the prosthetic aortic valve.The abdomen is soft  and nontender. Bowel sounds are normal. The liver and spleen are not enlarged. There Are no abdominal masses. There are no bruits.  Normal extremity without phlebitis or edema.  Pedal pulses are good.The skin is warm and dry.  There is no rash.Strength is normal and symmetrical in all extremities.  There is no lateralizing weakness.  There are no sensory deficits.   Assessment / Plan: Continue on same medication.  Recheck in 6 months for office visit and he'll come in ahead of time for fasting lipid panel hepatic function panel and Basal metabolic panel

## 2010-07-01 NOTE — Assessment & Plan Note (Signed)
The patient has a past history of a bioprosthetic aortic valve replacement on 12/17/08 for severe aortic stenosis.  Dr. Cornelius Moras was his surgeon.  The patient is doing very well postop.  He is not having any chest pain or shortness of breath.  He is very Faithful about exercising regularlyAnd he makes sure that his weight does not go up.He has not been having any dizziness or syncope.  No palpitations.

## 2010-07-01 NOTE — Assessment & Plan Note (Signed)
The patient has had no recurrence of angina pectoris since his coronary bypass graft surgery which was done at the time of his aortic valve replacement on 12/17/08

## 2010-07-01 NOTE — Assessment & Plan Note (Signed)
The patient has been watching his dietary salt intake he has not been having any problems with high blood pressure.He is tolerating his small dose of Lopressor without side effects.

## 2011-02-17 ENCOUNTER — Other Ambulatory Visit: Payer: Self-pay | Admitting: Dermatology

## 2011-02-17 DIAGNOSIS — D485 Neoplasm of uncertain behavior of skin: Secondary | ICD-10-CM | POA: Diagnosis not present

## 2011-02-17 DIAGNOSIS — Z85828 Personal history of other malignant neoplasm of skin: Secondary | ICD-10-CM | POA: Diagnosis not present

## 2011-02-17 DIAGNOSIS — L570 Actinic keratosis: Secondary | ICD-10-CM | POA: Diagnosis not present

## 2011-02-17 DIAGNOSIS — H61009 Unspecified perichondritis of external ear, unspecified ear: Secondary | ICD-10-CM | POA: Diagnosis not present

## 2011-06-03 ENCOUNTER — Telehealth: Payer: Self-pay | Admitting: Cardiology

## 2011-06-03 DIAGNOSIS — E7800 Pure hypercholesterolemia, unspecified: Secondary | ICD-10-CM

## 2011-06-03 NOTE — Telephone Encounter (Signed)
Needs labs, orders put in for visit next week

## 2011-06-03 NOTE — Telephone Encounter (Signed)
New problem:  Patient calling to see if he need lab work prior to appt on 5/10.

## 2011-06-10 ENCOUNTER — Ambulatory Visit (INDEPENDENT_AMBULATORY_CARE_PROVIDER_SITE_OTHER): Payer: Medicare Other | Admitting: Cardiology

## 2011-06-10 ENCOUNTER — Encounter: Payer: Self-pay | Admitting: Cardiology

## 2011-06-10 VITALS — BP 114/64 | HR 60 | Ht 70.0 in | Wt 159.0 lb

## 2011-06-10 DIAGNOSIS — E785 Hyperlipidemia, unspecified: Secondary | ICD-10-CM

## 2011-06-10 DIAGNOSIS — Z951 Presence of aortocoronary bypass graft: Secondary | ICD-10-CM

## 2011-06-10 DIAGNOSIS — E7800 Pure hypercholesterolemia, unspecified: Secondary | ICD-10-CM | POA: Diagnosis not present

## 2011-06-10 DIAGNOSIS — T8201XA Breakdown (mechanical) of heart valve prosthesis, initial encounter: Secondary | ICD-10-CM

## 2011-06-10 DIAGNOSIS — Z954 Presence of other heart-valve replacement: Secondary | ICD-10-CM

## 2011-06-10 DIAGNOSIS — I119 Hypertensive heart disease without heart failure: Secondary | ICD-10-CM

## 2011-06-10 DIAGNOSIS — Z952 Presence of prosthetic heart valve: Secondary | ICD-10-CM

## 2011-06-10 LAB — BASIC METABOLIC PANEL
BUN: 20 mg/dL (ref 6–23)
Calcium: 9.2 mg/dL (ref 8.4–10.5)
GFR: 79.44 mL/min (ref >60.00)
Glucose, Bld: 89 mg/dL (ref 70–99)
Sodium: 139 mEq/L (ref 135–145)

## 2011-06-10 LAB — HEPATIC FUNCTION PANEL
ALT: 18 U/L (ref 0–53)
AST: 23 U/L (ref 0–37)
Bilirubin, Direct: 0.2 mg/dL (ref 0.0–0.3)
Total Bilirubin: 1.1 mg/dL (ref 0.3–1.2)
Total Protein: 6.5 g/dL (ref 6.0–8.3)

## 2011-06-10 LAB — LIPID PANEL
Cholesterol: 112 mg/dL (ref 0–200)
HDL: 64.5 mg/dL (ref >39.00)
VLDL: 10.6 mg/dL (ref 0.0–40.0)

## 2011-06-10 NOTE — Assessment & Plan Note (Signed)
The patient denies any chest pain shortness of breath or palpitations.  No headaches or dizziness

## 2011-06-10 NOTE — Patient Instructions (Signed)
Will obtain labs today and call you with the results  Your physician recommends that you continue on your current medications as directed. Please refer to the Current Medication list given to you today.  Your physician wants you to follow-up in: 6 months You will receive a reminder letter in the mail two months in advance. If you don't receive a letter, please call our office to schedule the follow-up appointment.   

## 2011-06-10 NOTE — Progress Notes (Signed)
Unknown Jim Date of Birth:  03-06-1930 [facility] [address] [place], [place]  [address] [phone]         Fax   [phone]  History of Present Illness: This pleasant 78 year old gentleman is seen for a six-month followup office visit.  He has a past history of severe aortic stenosis.  On 12/17/08 he underwent aortic valve replacement with a pericardial tissue valve by Dr. Cornelius Moras.  He also underwent single-vessel CABG at the time of his aortic valve replacement.  The patient has been doing well.  He exercises on his own.  He rides an exercise bike 6 miles 3 times a week.  He also does other exercises and he does a lot of yard work.  Has been feeling well with no new cardiac symptoms.  Current Outpatient Prescriptions  Medication Sig Dispense Refill  . acetaminophen (TYLENOL) 500 MG tablet Take 500 mg by mouth every 6 (six) hours as needed.        Marland Kitchen aspirin 325 MG tablet Take 325 mg by mouth daily.        . calcium carbonate (OS-CAL) 600 MG TABS Take 600 mg by mouth 2 (two) times daily with a meal.        . Cholecalciferol (VITAMIN D) 2000 UNITS tablet Take 2,000 Units by mouth daily.        Marland Kitchen FIBER PO Take by mouth 2 (two) times daily.        . IRON PO Take by mouth every other day.        Marland Kitchen LIPITOR 10 MG tablet TAKE 1 TABLET BY MOUTH DAILY  90 tablet  2  . metoprolol tartrate (LOPRESSOR) 25 MG tablet Take 1/2 tab by mouth daily   90 tablet  3  . Multiple Vitamin (MULTIVITAMIN) tablet Take 1 tablet by mouth daily.        . Protein POWD Take by mouth daily.        . vitamin C (ASCORBIC ACID) 500 MG tablet Take 500 mg by mouth daily.          Allergies  Allergen Reactions  . Penicillins     Patient Active Problem List  Diagnoses  . Prosthetic heart valve failure  . Hx of CABG  . Hyperlipidemia  . Benign hypertensive heart disease without heart failure    History  Smoking status  . Former Smoker  . Quit date: 06/30/2006  Smokeless tobacco  .  Not on file    History  Alcohol Use No    No family history on file.  Review of Systems: Constitutional: no fever chills diaphoresis or fatigue or change in weight.  Head and neck: no hearing loss, no epistaxis, no photophobia or visual disturbance. Respiratory: No cough, shortness of breath or wheezing. Cardiovascular: No chest pain peripheral edema, palpitations. Gastrointestinal: No abdominal distention, no abdominal pain, no change in bowel habits hematochezia or melena. Genitourinary: No dysuria, no frequency, no urgency, no nocturia. Musculoskeletal:No arthralgias, no back pain, no gait disturbance or myalgias. Neurological: No dizziness, no headaches, no numbness, no seizures, no syncope, no weakness, no tremors. Hematologic: No lymphadenopathy, no easy bruising. Psychiatric: No confusion, no hallucinations, no sleep disturbance.    Physical Exam: Filed Vitals:   06/10/11 1417  BP: 114/64  Pulse: 60   the general appearance reveals a tall  gentleman in no distress.Pupils equal and reactive.   Extraocular Movements are full.  There is no scleral icterus.  The mouth and pharynx  are normal.  The neck is supple.  The carotids reveal no bruits.  The jugular venous pressure is normal.  The thyroid is not enlarged.  There is no lymphadenopathy.  The chest is clear to percussion and auscultation. There are no rales or rhonchi. Expansion of the chest is symmetrical.  Heart reveals a soft systolic ejection murmur across the prosthetic aortic valve.  No diastolic murmur.  No gallop or rub. The abdomen is soft and nontender. Bowel sounds are normal. The liver and spleen are not enlarged. There Are no abdominal masses. There are no bruits.  The pedal pulses are good.  There is no phlebitis or edema.  There is no cyanosis or clubbing. Strength is normal and symmetrical in all extremities.  There is no lateralizing weakness.  There are no sensory deficits.  EKG today shows sinus  bradycardia with first degree AV block and nonspecific T-wave abnormalities.   Assessment / Plan: Continue same medication.  Blood work today pending.  Recheck in 6 months for office visit and fasting lab work

## 2011-06-10 NOTE — Assessment & Plan Note (Signed)
The patient has not been having a symptoms of congestive heart failure.  We talked today about the need for SBE prophylaxis for dental work.

## 2011-06-10 NOTE — Assessment & Plan Note (Signed)
Patient is on Lipitor 10 mg daily for hypercholesterolemia.  He is having no side effects.

## 2011-06-11 NOTE — Progress Notes (Signed)
Quick Note:  Please report to patient. The recent labs are stable. Continue same medication and careful diet. ______ 

## 2011-06-14 ENCOUNTER — Telehealth: Payer: Self-pay | Admitting: *Deleted

## 2011-06-14 NOTE — Telephone Encounter (Signed)
Message copied by Burnell Blanks on Tue Jun 14, 2011  9:18 AM ------      Message from: Cassell Clement      Created: Sat Jun 11, 2011  5:34 PM       Please report to patient.  The recent labs are stable. Continue same medication and careful diet.

## 2011-06-14 NOTE — Telephone Encounter (Signed)
Advised of labs 

## 2011-07-22 ENCOUNTER — Other Ambulatory Visit: Payer: Self-pay | Admitting: Cardiology

## 2011-09-01 DIAGNOSIS — H2510 Age-related nuclear cataract, unspecified eye: Secondary | ICD-10-CM | POA: Diagnosis not present

## 2011-09-30 DIAGNOSIS — Z23 Encounter for immunization: Secondary | ICD-10-CM | POA: Diagnosis not present

## 2011-10-25 ENCOUNTER — Other Ambulatory Visit: Payer: Self-pay | Admitting: Cardiology

## 2011-11-14 ENCOUNTER — Other Ambulatory Visit: Payer: Medicare Other

## 2011-12-14 ENCOUNTER — Other Ambulatory Visit (INDEPENDENT_AMBULATORY_CARE_PROVIDER_SITE_OTHER): Payer: Medicare Other

## 2011-12-14 DIAGNOSIS — E7800 Pure hypercholesterolemia, unspecified: Secondary | ICD-10-CM | POA: Diagnosis not present

## 2011-12-14 LAB — HEPATIC FUNCTION PANEL
Alkaline Phosphatase: 43 U/L (ref 39–117)
Bilirubin, Direct: 0.2 mg/dL (ref 0.0–0.3)
Total Bilirubin: 1.1 mg/dL (ref 0.3–1.2)

## 2011-12-14 LAB — LIPID PANEL
HDL: 60.6 mg/dL (ref >39.00)
LDL Cholesterol: 48 mg/dL (ref 0–99)
Total CHOL/HDL Ratio: 2
Triglycerides: 56 mg/dL (ref 0.0–149.0)
VLDL: 11.2 mg/dL (ref 0.0–40.0)

## 2011-12-14 LAB — BASIC METABOLIC PANEL
Calcium: 9.3 mg/dL (ref 8.4–10.5)
Creatinine, Ser: 1.1 mg/dL (ref 0.4–1.5)
Sodium: 137 mEq/L (ref 135–145)

## 2011-12-14 NOTE — Progress Notes (Signed)
Quick Note:  Please make copy of labs for patient visit. ______ 

## 2011-12-16 ENCOUNTER — Encounter: Payer: Self-pay | Admitting: Cardiology

## 2011-12-16 ENCOUNTER — Ambulatory Visit (INDEPENDENT_AMBULATORY_CARE_PROVIDER_SITE_OTHER): Payer: Medicare Other | Admitting: Cardiology

## 2011-12-16 VITALS — BP 136/70 | HR 76 | Resp 19 | Ht 70.0 in | Wt 157.8 lb

## 2011-12-16 DIAGNOSIS — E7800 Pure hypercholesterolemia, unspecified: Secondary | ICD-10-CM | POA: Diagnosis not present

## 2011-12-16 DIAGNOSIS — Z951 Presence of aortocoronary bypass graft: Secondary | ICD-10-CM

## 2011-12-16 DIAGNOSIS — Z952 Presence of prosthetic heart valve: Secondary | ICD-10-CM

## 2011-12-16 DIAGNOSIS — Z954 Presence of other heart-valve replacement: Secondary | ICD-10-CM

## 2011-12-16 DIAGNOSIS — E785 Hyperlipidemia, unspecified: Secondary | ICD-10-CM

## 2011-12-16 DIAGNOSIS — I119 Hypertensive heart disease without heart failure: Secondary | ICD-10-CM | POA: Diagnosis not present

## 2011-12-16 NOTE — Assessment & Plan Note (Signed)
The patient denies any dizziness or syncope.

## 2011-12-16 NOTE — Patient Instructions (Signed)
Your physician recommends that you continue on your current medications as directed. Please refer to the Current Medication list given to you today.  Your physician wants you to follow-up in: 6 months with fasting labs (lp/bmet/hfp)  You will receive a reminder letter in the mail two months in advance. If you don't receive a letter, please call our office to schedule the follow-up appointment.  

## 2011-12-16 NOTE — Progress Notes (Signed)
Unknown Marvin Wagner Date of Birth:  [birth year] [facility] [address] [place], [place]  [address] [phone]         Fax   [phone]  History of Present Illness: This pleasant 78 year old gentleman is seen for a six-month followup office visit. He has a past history of severe aortic stenosis. On 12/17/08 he underwent aortic valve replacement with a pericardial tissue valve by Dr. Cornelius Moras. He also underwent single-vessel CABG at the time of his aortic valve replacement. The patient has been doing well. He exercises on his own. He rides an exercise bike 6 miles 3 times a week. He also does other exercises and he does a lot of yard work. Has been feeling well with no new cardiac symptoms.  He does a lot of deep knee bends and has strengthened his legs.   Current Outpatient Prescriptions  Medication Sig Dispense Refill  . acetaminophen (TYLENOL) 500 MG tablet Take 500 mg by mouth every 6 (six) hours as needed.        Marland Kitchen aspirin 325 MG tablet Take 325 mg by mouth daily.        . calcium carbonate (OS-CAL) 600 MG TABS Take 600 mg by mouth 2 (two) times daily with a meal.        . Cholecalciferol (VITAMIN D) 2000 UNITS tablet Take 2,000 Units by mouth daily.        Marland Kitchen FIBER PO Take by mouth 2 (two) times daily.        . IRON PO Take by mouth every other day.        Marland Kitchen LIPITOR 10 MG tablet TAKE 1 TABLET BY MOUTH DAILY  90 tablet  2  . metoprolol tartrate (LOPRESSOR) 25 MG tablet TAKE 1/2 TAB BY MOUTH DAILY  90 tablet  0  . Multiple Vitamin (MULTIVITAMIN) tablet Take 1 tablet by mouth daily.        . Protein POWD Take by mouth daily.        . vitamin C (ASCORBIC ACID) 500 MG tablet Take 500 mg by mouth daily.          Allergies  Allergen Reactions  . Penicillins     Patient Active Problem List  Diagnosis  . Hx of CABG  . Hyperlipidemia  . Benign hypertensive heart disease without heart failure    History  Smoking status  . Former Smoker  . Quit date: 06/30/2006    Smokeless tobacco  . Not on file    History  Alcohol Use No    No family history on file.  Review of Systems: Constitutional: no fever chills diaphoresis or fatigue or change in weight.  Head and neck: no hearing loss, no epistaxis, no photophobia or visual disturbance. Respiratory: No cough, shortness of breath or wheezing. Cardiovascular: No chest pain peripheral edema, palpitations. Gastrointestinal: No abdominal distention, no abdominal pain, no change in bowel habits hematochezia or melena. Genitourinary: No dysuria, no frequency, no urgency, no nocturia. Musculoskeletal:No arthralgias, no back pain, no gait disturbance or myalgias. Neurological: No dizziness, no headaches, no numbness, no seizures, no syncope, no weakness, no tremors. Hematologic: No lymphadenopathy, no easy bruising. Psychiatric: No confusion, no hallucinations, no sleep disturbance.    Physical Exam: Filed Vitals:   12/16/11 1019  BP: 136/70  Pulse: 76  Resp: 19   the general appearance reveals a lean elderly gentleman in no distress.The head and neck exam reveals pupils equal and reactive.  Extraocular movements are full.  There  is no scleral icterus.  The mouth and pharynx are normal.  The neck is supple.  The carotids reveal no bruits.  The jugular venous pressure is normal.  The  thyroid is not enlarged.  There is no lymphadenopathy.  The chest is clear to percussion and auscultation.  There are no rales or rhonchi.  Expansion of the chest is symmetrical.  The precordium is quiet.  The first heart sound is normal.  The second heart sound is physiologically split.  There is a soft systolic ejection murmur across the prosthetic aortic valve.  There is no diastolic murmur. There is no abnormal lift or heave.  The abdomen is soft and nontender.  The bowel sounds are normal.  The liver and spleen are not enlarged.  There are no abdominal masses.  There are no abdominal bruits.  Extremities reveal good pedal  pulses.  There is no phlebitis or edema.  There is no cyanosis or clubbing.  Strength is normal and symmetrical in all extremities.  There is no lateralizing weakness.  There are no sensory deficits.  The skin is warm and dry.  There is no rash.     Assessment / Plan:  We reviewed his labs from last week which are excellent.  He will continue same medication.  Recheck in 6 months for followup office visit EKG lipid panel hepatic function panel and basal metabolic panel.  Continue regular exercise

## 2011-12-16 NOTE — Assessment & Plan Note (Signed)
The patient has not been experiencing any chest pain or angina.  No palpitations or shortness of breath.

## 2011-12-16 NOTE — Assessment & Plan Note (Signed)
The patient has not been experiencing any myalgias from the statin therapy.  Blood work remains excellent.  He has not had to really change his diet and he still has a small dose of ice cream each evening

## 2012-01-12 ENCOUNTER — Other Ambulatory Visit: Payer: Self-pay

## 2012-01-12 MED ORDER — METOPROLOL TARTRATE 25 MG PO TABS: ORAL_TABLET | ORAL | Status: DC

## 2012-01-13 ENCOUNTER — Other Ambulatory Visit: Payer: Self-pay

## 2012-01-13 MED ORDER — ATORVASTATIN CALCIUM 10 MG PO TABS: 10.0000 mg | ORAL_TABLET | Freq: Every day | ORAL | Status: DC

## 2012-01-13 MED ORDER — METOPROLOL TARTRATE 25 MG PO TABS: ORAL_TABLET | ORAL | Status: DC

## 2012-01-17 ENCOUNTER — Other Ambulatory Visit: Payer: Self-pay | Admitting: Cardiology

## 2012-01-17 MED ORDER — METOPROLOL TARTRATE 25 MG PO TABS: 12.5000 mg | ORAL_TABLET | Freq: Every day | ORAL | Status: DC

## 2012-02-08 ENCOUNTER — Telehealth: Payer: Self-pay | Admitting: Cardiology

## 2012-02-08 NOTE — Telephone Encounter (Signed)
Pt has a request for a referral to dr Lesia Sago for a mental health evaluation

## 2012-02-08 NOTE — Telephone Encounter (Signed)
Discussed with patient and  Dr. Patty Sermons will have patient come for ov next week. Patient thinks he is getting a little forgetful

## 2012-02-14 ENCOUNTER — Encounter: Payer: Self-pay | Admitting: Cardiology

## 2012-02-14 ENCOUNTER — Ambulatory Visit (INDEPENDENT_AMBULATORY_CARE_PROVIDER_SITE_OTHER): Payer: Medicare Other | Admitting: Cardiology

## 2012-02-14 VITALS — BP 124/70 | HR 77 | Resp 18 | Ht 69.0 in | Wt 158.0 lb

## 2012-02-14 DIAGNOSIS — Z951 Presence of aortocoronary bypass graft: Secondary | ICD-10-CM

## 2012-02-14 DIAGNOSIS — I119 Hypertensive heart disease without heart failure: Secondary | ICD-10-CM

## 2012-02-14 DIAGNOSIS — R413 Other amnesia: Secondary | ICD-10-CM

## 2012-02-14 NOTE — Assessment & Plan Note (Signed)
Blood pressure was remaining stable on current therapy.  No symptoms of CHF.  No dizziness or syncope 

## 2012-02-14 NOTE — Assessment & Plan Note (Signed)
The patient has not had any recurrent chest pain or angina. 

## 2012-02-14 NOTE — Patient Instructions (Addendum)
Will arrange for you to see Dr Anne Hahn, will call you with appointment date and time  Your physician recommends that you continue on your current medications as directed. Please refer to the Current Medication list given to you today.  Keep your regularly scheduled appointment

## 2012-02-14 NOTE — Progress Notes (Signed)
Unknown Jim Date of Birth:  [birth year] [facility] [address] [place], [place]  [address] [phone]         Fax   [phone]  History of Present Illness: This pleasant 79 year old gentleman is seen for a work in office visit. He has a past history of severe aortic stenosis. On 12/17/08 he underwent aortic valve replacement with a pericardial tissue valve by Dr. Cornelius Moras. He also underwent single-vessel CABG at the time of his aortic valve replacement. The patient has been doing well. He exercises on his own. He rides an exercise bike 6 miles 3 times a week. He also does other exercises and he does a lot of yard work. Has been feeling well with no new cardiac symptoms. He does a lot of deep knee bends and has strengthened his legs. He comes in today because of concern about his memory.  It turns out that his wife has developed memory problems and the patient is concerned that he also is having difficulty with his memory he would like to be seen by a neurologist.  He has noted some decrease in short-term memory and he also has been troubled with not being able to call up names of people that he meets on the street that he should know.  He has not been having any problems with confusion.  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  Current Outpatient Prescriptions  Medication Sig Dispense Refill  . aspirin 325 MG tablet Take 325 mg by mouth daily.        Marland Kitchen atorvastatin (LIPITOR) 10 MG tablet Take 1 tablet (10 mg total) by mouth daily.  90 tablet  2  . calcium carbonate (OS-CAL) 600 MG TABS Take 600 mg by mouth 2 (two) times daily with a meal.        . Cholecalciferol (VITAMIN D) 2000 UNITS tablet Take 2,000 Units by mouth daily.        Marland Kitchen FIBER PO Take by mouth 2 (two) times daily.        . IRON PO Take by mouth every other day.        . metoprolol tartrate (LOPRESSOR) 25 MG tablet Take 0.5 tablets (12.5  mg total) by mouth daily.  90 tablet  1  . Multiple Vitamin (MULTIVITAMIN) tablet Take 1 tablet by mouth daily.        . Protein POWD Take by mouth daily.        . vitamin C (ASCORBIC ACID) 500 MG tablet Take 500 mg by mouth daily.        Marland Kitchen acetaminophen (TYLENOL) 500 MG tablet Take 500 mg by mouth every 6 (six) hours as needed.          Allergies  Allergen Reactions  . Penicillins     Patient Active Problem List  Diagnosis  . Hx of CABG  . Hyperlipidemia  . Benign hypertensive heart disease without heart failure    History  Smoking status  . Former Smoker  . Quit date: 06/30/2006  Smokeless tobacco  . Not on file    History  Alcohol Use No    No family history on file.  Review of Systems: Constitutional: no fever chills diaphoresis or fatigue or change in weight.  Head and neck: no hearing loss, no epistaxis, no photophobia or visual disturbance. Respiratory: No cough, shortness of breath or wheezing. Cardiovascular: No chest  pain peripheral edema, palpitations. Gastrointestinal: No abdominal distention, no abdominal pain, no change in bowel habits hematochezia or melena. Genitourinary: No dysuria, no frequency, no urgency, no nocturia. Musculoskeletal:No arthralgias, no back pain, no gait disturbance or myalgias. Neurological: No dizziness, no headaches, no numbness, no seizures, no syncope, no weakness, no tremors. Hematologic: No lymphadenopathy, no easy bruising. Psychiatric: No confusion, no hallucinations, no sleep disturbance.    Physical Exam: Filed Vitals:   02/14/12 1459  BP: 124/70  Pulse: 77  Resp: 18     Assessment / Plan:

## 2012-02-14 NOTE — Assessment & Plan Note (Signed)
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  He is very anxious to be checked so that the problem can be caught early and treated if necessary and would like to have a neurologist check him.  We were referred to Dr. Anne Hahn who will also be seeing the patient's wife soon.

## 2012-02-15 ENCOUNTER — Other Ambulatory Visit: Payer: Self-pay | Admitting: Dermatology

## 2012-02-15 DIAGNOSIS — D044 Carcinoma in situ of skin of scalp and neck: Secondary | ICD-10-CM | POA: Diagnosis not present

## 2012-02-15 DIAGNOSIS — D0420 Carcinoma in situ of skin of unspecified ear and external auricular canal: Secondary | ICD-10-CM | POA: Diagnosis not present

## 2012-02-29 ENCOUNTER — Other Ambulatory Visit: Payer: Self-pay | Admitting: Pulmonary Disease

## 2012-02-29 ENCOUNTER — Other Ambulatory Visit (INDEPENDENT_AMBULATORY_CARE_PROVIDER_SITE_OTHER): Payer: Medicare Other

## 2012-02-29 DIAGNOSIS — F411 Generalized anxiety disorder: Secondary | ICD-10-CM

## 2012-02-29 DIAGNOSIS — E538 Deficiency of other specified B group vitamins: Secondary | ICD-10-CM

## 2012-02-29 DIAGNOSIS — D518 Other vitamin B12 deficiency anemias: Secondary | ICD-10-CM | POA: Insufficient documentation

## 2012-02-29 DIAGNOSIS — F419 Anxiety disorder, unspecified: Secondary | ICD-10-CM

## 2012-02-29 DIAGNOSIS — R413 Other amnesia: Secondary | ICD-10-CM | POA: Diagnosis not present

## 2012-03-01 ENCOUNTER — Other Ambulatory Visit: Payer: Self-pay | Admitting: Neurology

## 2012-03-01 DIAGNOSIS — R413 Other amnesia: Secondary | ICD-10-CM

## 2012-03-08 ENCOUNTER — Ambulatory Visit
Admission: RE | Admit: 2012-03-08 | Discharge: 2012-03-08 | Disposition: A | Discharge status: Home / Self Care | Payer: Medicare Other | Source: Ambulatory Visit | Attending: Neurology | Admitting: Neurology

## 2012-03-08 DIAGNOSIS — R413 Other amnesia: Secondary | ICD-10-CM | POA: Diagnosis not present

## 2012-04-12 ENCOUNTER — Other Ambulatory Visit: Payer: Self-pay | Admitting: *Deleted

## 2012-04-12 MED ORDER — ATORVASTATIN CALCIUM 10 MG PO TABS: 10.0000 mg | ORAL_TABLET | Freq: Every day | ORAL | Status: DC

## 2012-06-11 ENCOUNTER — Other Ambulatory Visit (INDEPENDENT_AMBULATORY_CARE_PROVIDER_SITE_OTHER): Payer: Medicare Other

## 2012-06-11 DIAGNOSIS — E7800 Pure hypercholesterolemia, unspecified: Secondary | ICD-10-CM

## 2012-06-11 LAB — BASIC METABOLIC PANEL
CO2: 30 mEq/L (ref 19–32)
Calcium: 9 mg/dL (ref 8.4–10.5)
Chloride: 102 mEq/L (ref 96–112)
Creatinine, Ser: 0.9 mg/dL (ref 0.4–1.5)
Sodium: 139 mEq/L (ref 135–145)

## 2012-06-11 LAB — HEPATIC FUNCTION PANEL
Albumin: 3.9 g/dL (ref 3.5–5.2)
Alkaline Phosphatase: 42 U/L (ref 39–117)
Total Protein: 6.5 g/dL (ref 6.0–8.3)

## 2012-06-11 LAB — LIPID PANEL
Cholesterol: 117 mg/dL (ref 0–200)
HDL: 60.4 mg/dL (ref >39.00)
LDL Cholesterol: 46 mg/dL (ref 0–99)
Total CHOL/HDL Ratio: 2
Triglycerides: 53 mg/dL (ref 0.0–149.0)

## 2012-06-11 NOTE — Progress Notes (Signed)
Quick Note:  Please make copy of labs for patient visit. ______ 

## 2012-06-13 ENCOUNTER — Ambulatory Visit (INDEPENDENT_AMBULATORY_CARE_PROVIDER_SITE_OTHER): Payer: Medicare Other | Admitting: Cardiology

## 2012-06-13 ENCOUNTER — Encounter: Payer: Self-pay | Admitting: Cardiology

## 2012-06-13 VITALS — BP 116/58 | HR 58 | Ht 69.0 in | Wt 157.6 lb

## 2012-06-13 DIAGNOSIS — E785 Hyperlipidemia, unspecified: Secondary | ICD-10-CM

## 2012-06-13 DIAGNOSIS — I119 Hypertensive heart disease without heart failure: Secondary | ICD-10-CM | POA: Diagnosis not present

## 2012-06-13 DIAGNOSIS — E7800 Pure hypercholesterolemia, unspecified: Secondary | ICD-10-CM | POA: Diagnosis not present

## 2012-06-13 DIAGNOSIS — Z954 Presence of other heart-valve replacement: Secondary | ICD-10-CM | POA: Diagnosis not present

## 2012-06-13 DIAGNOSIS — Z952 Presence of prosthetic heart valve: Secondary | ICD-10-CM | POA: Insufficient documentation

## 2012-06-13 NOTE — Assessment & Plan Note (Signed)
We reviewed his recent labs which show excellent results from his low cholesterol regimen.  He is on low-dose  atorvastatin 10 mg daily and is not having any myalgias.

## 2012-06-13 NOTE — Patient Instructions (Addendum)
Your physician recommends that you continue on your current medications as directed. Please refer to the Current Medication list given to you today.  Your physician wants you to follow-up in: 6 months with fasting labs (lp/bmet/hfp)  You will receive a reminder letter in the mail two months in advance. If you don't receive a letter, please call our office to schedule the follow-up appointment.  

## 2012-06-13 NOTE — Progress Notes (Signed)
Unknown Jim Date of Birth:  01/12/1931 [facility] [address] [place], [place]  [address] [phone]  Fax   [phone]  HPI: This pleasant 79 year old gentleman is seen for a six-month followup office visit. He has a past history of severe aortic stenosis. On 12/17/08 he underwent aortic valve replacement with a pericardial tissue valve by Dr. Cornelius Moras. He also underwent single-vessel CABG at the time of his aortic valve replacement. The patient has been doing well. He exercises on his own. He rides an exercise bike 6 miles 3 times a week. He also does other exercises and he does a lot of yard work. Has been feeling well with no new cardiac symptoms. He does a lot of deep knee bends and has strengthened his legs.  Since last visit he has had no new cardiac symptoms.  Specifically he denies chest pain or shortness of breath.   Current Outpatient Prescriptions  Medication Sig Dispense Refill  . acetaminophen (TYLENOL) 500 MG tablet Take 500 mg by mouth every 6 (six) hours as needed.        Marland Kitchen aspirin 325 MG tablet Take 325 mg by mouth daily.        Marland Kitchen atorvastatin (LIPITOR) 10 MG tablet Take 1 tablet (10 mg total) by mouth daily.  90 tablet  2  . calcium carbonate (OS-CAL) 600 MG TABS Take 600 mg by mouth 2 (two) times daily with a meal.        . Cholecalciferol (VITAMIN D) 2000 UNITS tablet Take 2,000 Units by mouth daily.        Marland Kitchen FIBER PO Take by mouth 2 (two) times daily.        . IRON PO Take by mouth every other day.        . metoprolol tartrate (LOPRESSOR) 25 MG tablet Take 0.5 tablets (12.5 mg total) by mouth daily.  90 tablet  1  . Multiple Vitamin (MULTIVITAMIN) tablet Take 1 tablet by mouth daily.        . Protein POWD Take by mouth daily.        . vitamin C (ASCORBIC ACID) 500 MG tablet Take 500 mg by mouth daily.         No current facility-administered medications for this visit.    Allergies  Allergen Reactions  . Penicillins     Patient Active  Problem List   Diagnosis Date Noted  . Hyperlipidemia 07/01/2010    Priority: High  . Benign hypertensive heart disease without heart failure 07/01/2010    Priority: Medium  . S/P AVR 06/13/2012  . Memory disorder 02/14/2012  . Hx of CABG 07/01/2010    History  Smoking status  . Former Smoker  . Quit date: 06/30/2006  Smokeless tobacco  . Not on file    History  Alcohol Use No    No family history on file.  Review of Systems: The patient denies any heat or cold intolerance.  No weight gain or weight loss.  The patient denies headaches or blurry vision.  There is no cough or sputum production.  The patient denies dizziness.  There is no hematuria or hematochezia.  The patient denies any muscle aches or arthritis.  The patient denies any rash.  The patient denies frequent falling or instability.  There is no history of depression or anxiety.  All other systems were reviewed and are negative.   Physical Exam: Filed Vitals:   06/13/12 0925  BP: 116/58  Pulse: 58  the general appearance reveals a very fit gentleman in no distress.The head and neck exam reveals pupils equal and reactive.  Extraocular movements are full.  There is no scleral icterus.  The mouth and pharynx are normal.  The neck is supple.  The carotids reveal no bruits.  The jugular venous pressure is normal.  The  thyroid is not enlarged.  There is no lymphadenopathy.  The chest is clear to percussion and auscultation.  There are no rales or rhonchi.  Expansion of the chest is symmetrical.  The precordium is quiet.  The first heart sound is normal.  The second heart sound is physiologically split.  There is no gallop rub or click.  There is a soft flow murmur across the prosthetic aortic valve.  There is no diastolic murmur.  There is no abnormal lift or heave.  The abdomen is soft and nontender.  The bowel sounds are normal.  The liver and spleen are not enlarged.  There are no abdominal masses.  There are no abdominal  bruits.  Extremities reveal good pedal pulses.  There is no phlebitis or edema.  There is no cyanosis or clubbing.  Strength is normal and symmetrical in all extremities.  There is no lateralizing weakness.  There are no sensory deficits.  The skin is warm and dry.  There is no rash.      Assessment / Plan:  Continue on same medication.  Recheck in 6 months for office visit lipid panel hepatic function panel and basal metabolic panel.

## 2012-06-13 NOTE — Assessment & Plan Note (Signed)
Blood pressure is remaining stable on current therapy. 

## 2012-06-13 NOTE — Assessment & Plan Note (Signed)
The patient is not having any exertional chest discomfort or dyspnea.  His energy remains excellent.  He has had no fever or chills or constitutional symptoms.Marland Kitchen

## 2012-08-10 DIAGNOSIS — Z86010 Personal history of colonic polyps: Secondary | ICD-10-CM | POA: Diagnosis not present

## 2012-08-10 DIAGNOSIS — Z09 Encounter for follow-up examination after completed treatment for conditions other than malignant neoplasm: Secondary | ICD-10-CM | POA: Diagnosis not present

## 2012-08-10 DIAGNOSIS — K5730 Diverticulosis of large intestine without perforation or abscess without bleeding: Secondary | ICD-10-CM | POA: Diagnosis not present

## 2012-08-28 ENCOUNTER — Other Ambulatory Visit: Payer: Self-pay | Admitting: Dermatology

## 2012-08-28 DIAGNOSIS — C4492 Squamous cell carcinoma of skin, unspecified: Secondary | ICD-10-CM | POA: Diagnosis not present

## 2012-08-28 DIAGNOSIS — C44221 Squamous cell carcinoma of skin of unspecified ear and external auricular canal: Secondary | ICD-10-CM | POA: Diagnosis not present

## 2012-09-04 ENCOUNTER — Encounter: Payer: Self-pay | Admitting: Neurology

## 2012-09-04 DIAGNOSIS — D518 Other vitamin B12 deficiency anemias: Secondary | ICD-10-CM

## 2012-09-05 ENCOUNTER — Ambulatory Visit: Payer: Self-pay | Admitting: Neurology

## 2012-09-13 DIAGNOSIS — H2510 Age-related nuclear cataract, unspecified eye: Secondary | ICD-10-CM | POA: Diagnosis not present

## 2012-09-26 ENCOUNTER — Other Ambulatory Visit: Payer: Self-pay | Admitting: Dermatology

## 2012-09-26 DIAGNOSIS — C44221 Squamous cell carcinoma of skin of unspecified ear and external auricular canal: Secondary | ICD-10-CM | POA: Diagnosis not present

## 2012-10-03 DIAGNOSIS — Z23 Encounter for immunization: Secondary | ICD-10-CM | POA: Diagnosis not present

## 2012-10-11 ENCOUNTER — Encounter: Payer: Self-pay | Admitting: Pulmonary Disease

## 2012-10-22 DIAGNOSIS — C44221 Squamous cell carcinoma of skin of unspecified ear and external auricular canal: Secondary | ICD-10-CM | POA: Diagnosis not present

## 2012-11-30 ENCOUNTER — Other Ambulatory Visit: Payer: Self-pay | Admitting: Cardiology

## 2012-12-14 ENCOUNTER — Other Ambulatory Visit (INDEPENDENT_AMBULATORY_CARE_PROVIDER_SITE_OTHER): Payer: Medicare Other

## 2012-12-14 ENCOUNTER — Ambulatory Visit (INDEPENDENT_AMBULATORY_CARE_PROVIDER_SITE_OTHER): Payer: Medicare Other | Admitting: Cardiology

## 2012-12-14 ENCOUNTER — Encounter: Payer: Self-pay | Admitting: Cardiology

## 2012-12-14 VITALS — BP 108/64 | HR 62 | Ht 69.0 in | Wt 155.0 lb

## 2012-12-14 DIAGNOSIS — I119 Hypertensive heart disease without heart failure: Secondary | ICD-10-CM

## 2012-12-14 DIAGNOSIS — Z954 Presence of other heart-valve replacement: Secondary | ICD-10-CM | POA: Diagnosis not present

## 2012-12-14 DIAGNOSIS — I4892 Unspecified atrial flutter: Secondary | ICD-10-CM | POA: Diagnosis not present

## 2012-12-14 DIAGNOSIS — Z952 Presence of prosthetic heart valve: Secondary | ICD-10-CM

## 2012-12-14 DIAGNOSIS — Z951 Presence of aortocoronary bypass graft: Secondary | ICD-10-CM

## 2012-12-14 DIAGNOSIS — E7800 Pure hypercholesterolemia, unspecified: Secondary | ICD-10-CM | POA: Diagnosis not present

## 2012-12-14 DIAGNOSIS — Z79899 Other long term (current) drug therapy: Secondary | ICD-10-CM

## 2012-12-14 LAB — HEPATIC FUNCTION PANEL
ALT: 19 U/L (ref 0–53)
AST: 24 U/L (ref 0–37)
Albumin: 3.9 g/dL (ref 3.5–5.2)
Alkaline Phosphatase: 43 U/L (ref 39–117)
Bilirubin, Direct: 0.1 mg/dL (ref 0.0–0.3)
Total Protein: 6.5 g/dL (ref 6.0–8.3)

## 2012-12-14 LAB — BASIC METABOLIC PANEL
BUN: 20 mg/dL (ref 6–23)
Calcium: 9.5 mg/dL (ref 8.4–10.5)
Creatinine, Ser: 1 mg/dL (ref 0.4–1.5)

## 2012-12-14 LAB — LIPID PANEL
Cholesterol: 119 mg/dL (ref 0–200)
Triglycerides: 35 mg/dL (ref 0.0–149.0)

## 2012-12-14 LAB — T4, FREE: Free T4: 0.7 ng/dL (ref 0.60–1.60)

## 2012-12-14 MED ORDER — WARFARIN SODIUM 5 MG PO TABS: 5.0000 mg | ORAL_TABLET | Freq: Every day | ORAL | Status: DC

## 2012-12-14 NOTE — Patient Instructions (Signed)
You have been referred to Anticoagulation Clinic.  The patient will need weekly INRs for 4 WEEKS for pending DCCV.   Your physician has recommended you make the following change in your medication: START Coumadin (Warfarin) 5mg  take one by mouth every evening at the same time  Your physician has requested that you have an echocardiogram. Echocardiography is a painless test that uses sound waves to create images of your heart. It provides your doctor with information about the size and shape of your heart and how well your heart's chambers and valves are working. This procedure takes approximately one hour. There are no restrictions for this procedure.  Your physician recommends that you schedule a follow-up appointment in: 1 MONTH with Dr Patty Sermons for an EKG

## 2012-12-14 NOTE — Assessment & Plan Note (Signed)
Blood pressure was remaining stable on current therapy 

## 2012-12-14 NOTE — Progress Notes (Signed)
Unknown Marvin Wagner Date of Birth:  [birth year] [address] [place], [place]  [address] [phone]  Fax   [phone]  HPI: This pleasant 79 year old gentleman is seen for a six-month followup office visit. He has a past history of severe aortic stenosis. On 12/17/08 he underwent aortic valve replacement with a pericardial tissue valve by Dr. Cornelius Moras. He also underwent single-vessel CABG at the time of his aortic valve replacement. The patient has been doing well. He exercises on his own. He rides an exercise bike 6 miles 3 times a week. He also does other exercises and he does a lot of yard work. Has been feeling well with no new cardiac symptoms. He does a lot of deep knee bends and has strengthened his legs.  Since last visit he has had no new cardiac symptoms.  Specifically he denies chest pain or shortness of breath.  He has not had any TIA symptoms.  He has not been aware of any palpitations or irregular heartbeat.  He does not have any past history of atrial flutter fibrillation.   Current Outpatient Prescriptions  Medication Sig Dispense Refill  . acetaminophen (TYLENOL) 500 MG tablet Take 500 mg by mouth every 6 (six) hours as needed.        Marland Kitchen aspirin 325 MG tablet Take 325 mg by mouth daily.        Marland Kitchen atorvastatin (LIPITOR) 10 MG tablet Take 1 tablet (10 mg total) by mouth daily.  90 tablet  2  . calcium carbonate (OS-CAL) 600 MG TABS Take 600 mg by mouth 2 (two) times daily with a meal.        . Cholecalciferol (VITAMIN D) 2000 UNITS tablet Take 2,000 Units by mouth daily.        Marland Kitchen FIBER PO Take by mouth 2 (two) times daily.        . metoprolol tartrate (LOPRESSOR) 25 MG tablet Take 0.5 tablets (12.5 mg total) by mouth daily.  90 tablet  1  . Multiple Vitamin (MULTIVITAMIN) tablet Take 1 tablet by mouth daily.        . Protein POWD Take by mouth daily.        . vitamin C (ASCORBIC ACID) 500 MG tablet Take 500 mg by mouth daily.        Marland Kitchen warfarin (COUMADIN) 5 MG tablet  Take 1 tablet (5 mg total) by mouth daily with supper.  30 tablet  1   No current facility-administered medications for this visit.    Allergies  Allergen Reactions  . Penicillins     Patient Active Problem List   Diagnosis Date Noted  . Hyperlipidemia 07/01/2010    Priority: High  . Benign hypertensive heart disease without heart failure 07/01/2010    Priority: Medium  . Atrial flutter 12/14/2012  . S/P AVR 06/13/2012  . Other vitamin B12 deficiency anemia 02/29/2012  . Memory disorder 02/14/2012  . Hx of CABG 07/01/2010    History  Smoking status  . Former Smoker  . Quit date: 06/30/2006  Smokeless tobacco  . Not on file    History  Alcohol Use No    Family History  Problem Relation Age of Onset  . Ankylosing spondylitis Brother     Review of Systems: The patient denies any heat or cold intolerance.  No weight gain or weight loss.  The patient denies headaches or blurry vision.  There is no cough or sputum production.  The patient denies dizziness.  There is no  hematuria or hematochezia.  The patient denies any muscle aches or arthritis.  The patient denies any rash.  The patient denies frequent falling or instability.  There is no history of depression or anxiety.  All other systems were reviewed and are negative.   Physical Exam: Filed Vitals:   12/14/12 0840  BP: 108/64  Pulse: 62   the general appearance reveals a very fit gentleman in no distress.The head and neck exam reveals pupils equal and reactive.  Extraocular movements are full.  There is no scleral icterus.  The mouth and pharynx are normal.  The neck is supple.  The carotids reveal no bruits.  The jugular venous pressure is normal.  The  thyroid is not enlarged.  There is no lymphadenopathy.  The chest is clear to percussion and auscultation.  There are no rales or rhonchi.  Expansion of the chest is symmetrical.  The precordium is quiet.  The first heart sound is normal.  The second heart sound is  physiologically split.  There is no gallop rub or click.  There is a soft flow murmur across the prosthetic aortic valve.  The pulse is irregular. There is no diastolic murmur.  There is no abnormal lift or heave.  The abdomen is soft and nontender.  The bowel sounds are normal.  The liver and spleen are not enlarged.  There are no abdominal masses.  There are no abdominal bruits.  Extremities reveal good pedal pulses.  There is no phlebitis or edema.  There is no cyanosis or clubbing.  Strength is normal and symmetrical in all extremities.  There is no lateralizing weakness.  There are no sensory deficits.  The skin is warm and dry.  There is no rash.  EKG shows atrial flutter with controlled ventricular response.    Assessment / Plan:  New atrial flutter, asymptomatic and of unknown duration.  Chadsvasc score of 3-4 Plan: Check thyroid function.  Update 2-D echo.  Start Coumadin 5 mg daily and referred to Coumadin clinic.  Consider outpatient cardioversion in about a month after suitable period of anticoagulation.  Office visit and EKG in one month.

## 2012-12-14 NOTE — Assessment & Plan Note (Signed)
The patient is not having any symptoms of CHF.  His exercise tolerance remains excellent.

## 2012-12-14 NOTE — Assessment & Plan Note (Signed)
EKG today shows atrial flutter which is new since January 2014.  The duration of his atrial flutter is not known.  He is asymptomatic and his ventricular response is normal at 62 per minute.  He has a Chadsvasc score of 3 or 4 if one counts hypertension.  We will start him on Coumadin 5 mg daily and referred to Coumadin clinic.  Once he is therapeutic on his Coumadin, his aspirin can be stopped.  We will consider outpatient cardioversion once he has had 4 weeks of adequate protimes.

## 2012-12-15 NOTE — Progress Notes (Signed)
Quick Note:  Please report to patient. The recent labs are stable. Continue same medication and careful diet. Thyroid normal. ______ 

## 2012-12-15 NOTE — Progress Notes (Signed)
Quick Note:  Please report to patient. The recent labs are stable. Continue same medication and careful diet. ______ 

## 2012-12-19 ENCOUNTER — Ambulatory Visit (INDEPENDENT_AMBULATORY_CARE_PROVIDER_SITE_OTHER): Payer: Medicare Other | Admitting: *Deleted

## 2012-12-19 DIAGNOSIS — I4892 Unspecified atrial flutter: Secondary | ICD-10-CM | POA: Diagnosis not present

## 2012-12-19 DIAGNOSIS — Z7901 Long term (current) use of anticoagulants: Secondary | ICD-10-CM | POA: Diagnosis not present

## 2012-12-19 DIAGNOSIS — Z952 Presence of prosthetic heart valve: Secondary | ICD-10-CM

## 2012-12-19 DIAGNOSIS — Z954 Presence of other heart-valve replacement: Secondary | ICD-10-CM

## 2012-12-19 DIAGNOSIS — Z5181 Encounter for therapeutic drug level monitoring: Secondary | ICD-10-CM | POA: Diagnosis not present

## 2012-12-19 LAB — POCT INR: INR: 1.5

## 2012-12-19 NOTE — Patient Instructions (Signed)

## 2012-12-20 ENCOUNTER — Other Ambulatory Visit: Payer: Self-pay

## 2012-12-20 MED ORDER — METOPROLOL TARTRATE 25 MG PO TABS: 12.5000 mg | ORAL_TABLET | Freq: Every day | ORAL | Status: DC

## 2012-12-26 ENCOUNTER — Ambulatory Visit (INDEPENDENT_AMBULATORY_CARE_PROVIDER_SITE_OTHER): Payer: Medicare Other | Admitting: Pharmacist

## 2012-12-26 ENCOUNTER — Other Ambulatory Visit: Payer: Self-pay

## 2012-12-26 DIAGNOSIS — Z7901 Long term (current) use of anticoagulants: Secondary | ICD-10-CM

## 2012-12-26 DIAGNOSIS — Z954 Presence of other heart-valve replacement: Secondary | ICD-10-CM | POA: Diagnosis not present

## 2012-12-26 DIAGNOSIS — I4892 Unspecified atrial flutter: Secondary | ICD-10-CM

## 2012-12-26 DIAGNOSIS — Z952 Presence of prosthetic heart valve: Secondary | ICD-10-CM

## 2012-12-26 LAB — POCT INR: INR: 4

## 2012-12-26 MED ORDER — METOPROLOL TARTRATE 25 MG PO TABS: 12.5000 mg | ORAL_TABLET | Freq: Every day | ORAL | Status: DC

## 2013-01-02 ENCOUNTER — Ambulatory Visit (INDEPENDENT_AMBULATORY_CARE_PROVIDER_SITE_OTHER): Payer: Medicare Other | Admitting: Pharmacist

## 2013-01-02 DIAGNOSIS — Z952 Presence of prosthetic heart valve: Secondary | ICD-10-CM

## 2013-01-02 DIAGNOSIS — Z7901 Long term (current) use of anticoagulants: Secondary | ICD-10-CM | POA: Diagnosis not present

## 2013-01-02 DIAGNOSIS — I4892 Unspecified atrial flutter: Secondary | ICD-10-CM

## 2013-01-02 DIAGNOSIS — Z954 Presence of other heart-valve replacement: Secondary | ICD-10-CM | POA: Diagnosis not present

## 2013-01-02 DIAGNOSIS — Z5181 Encounter for therapeutic drug level monitoring: Secondary | ICD-10-CM

## 2013-01-02 LAB — POCT INR: INR: 2.8

## 2013-01-09 ENCOUNTER — Ambulatory Visit (HOSPITAL_COMMUNITY): Payer: Medicare Other | Attending: Cardiovascular Disease | Admitting: Radiology

## 2013-01-09 ENCOUNTER — Encounter: Payer: Self-pay | Admitting: Cardiovascular Disease

## 2013-01-09 ENCOUNTER — Ambulatory Visit (INDEPENDENT_AMBULATORY_CARE_PROVIDER_SITE_OTHER): Payer: Medicare Other | Admitting: Pharmacist

## 2013-01-09 DIAGNOSIS — Z952 Presence of prosthetic heart valve: Secondary | ICD-10-CM | POA: Diagnosis not present

## 2013-01-09 DIAGNOSIS — I079 Rheumatic tricuspid valve disease, unspecified: Secondary | ICD-10-CM | POA: Diagnosis not present

## 2013-01-09 DIAGNOSIS — I4891 Unspecified atrial fibrillation: Secondary | ICD-10-CM | POA: Insufficient documentation

## 2013-01-09 DIAGNOSIS — Z7901 Long term (current) use of anticoagulants: Secondary | ICD-10-CM | POA: Diagnosis not present

## 2013-01-09 DIAGNOSIS — I359 Nonrheumatic aortic valve disorder, unspecified: Secondary | ICD-10-CM | POA: Insufficient documentation

## 2013-01-09 DIAGNOSIS — I379 Nonrheumatic pulmonary valve disorder, unspecified: Secondary | ICD-10-CM | POA: Diagnosis not present

## 2013-01-09 DIAGNOSIS — I2510 Atherosclerotic heart disease of native coronary artery without angina pectoris: Secondary | ICD-10-CM | POA: Diagnosis not present

## 2013-01-09 DIAGNOSIS — I4892 Unspecified atrial flutter: Secondary | ICD-10-CM

## 2013-01-09 DIAGNOSIS — E785 Hyperlipidemia, unspecified: Secondary | ICD-10-CM | POA: Diagnosis not present

## 2013-01-09 DIAGNOSIS — Z954 Presence of other heart-valve replacement: Secondary | ICD-10-CM | POA: Diagnosis not present

## 2013-01-09 DIAGNOSIS — Z951 Presence of aortocoronary bypass graft: Secondary | ICD-10-CM | POA: Diagnosis not present

## 2013-01-09 LAB — POCT INR: INR: 2.8

## 2013-01-09 NOTE — Progress Notes (Signed)
Echocardiogram performed.  

## 2013-01-16 ENCOUNTER — Ambulatory Visit (INDEPENDENT_AMBULATORY_CARE_PROVIDER_SITE_OTHER): Payer: Medicare Other | Admitting: *Deleted

## 2013-01-16 DIAGNOSIS — Z954 Presence of other heart-valve replacement: Secondary | ICD-10-CM

## 2013-01-16 DIAGNOSIS — I4892 Unspecified atrial flutter: Secondary | ICD-10-CM | POA: Diagnosis not present

## 2013-01-16 DIAGNOSIS — Z951 Presence of aortocoronary bypass graft: Secondary | ICD-10-CM

## 2013-01-16 DIAGNOSIS — Z5181 Encounter for therapeutic drug level monitoring: Secondary | ICD-10-CM

## 2013-01-16 DIAGNOSIS — Z952 Presence of prosthetic heart valve: Secondary | ICD-10-CM

## 2013-01-16 DIAGNOSIS — Z7901 Long term (current) use of anticoagulants: Secondary | ICD-10-CM

## 2013-01-16 MED ORDER — WARFARIN SODIUM 5 MG PO TABS: ORAL_TABLET | ORAL | Status: DC

## 2013-01-21 ENCOUNTER — Other Ambulatory Visit: Payer: Self-pay | Admitting: Cardiology

## 2013-01-22 ENCOUNTER — Ambulatory Visit (INDEPENDENT_AMBULATORY_CARE_PROVIDER_SITE_OTHER): Payer: Medicare Other | Admitting: *Deleted

## 2013-01-22 DIAGNOSIS — I4892 Unspecified atrial flutter: Secondary | ICD-10-CM

## 2013-01-22 DIAGNOSIS — Z954 Presence of other heart-valve replacement: Secondary | ICD-10-CM

## 2013-01-22 DIAGNOSIS — Z952 Presence of prosthetic heart valve: Secondary | ICD-10-CM

## 2013-01-22 DIAGNOSIS — Z7901 Long term (current) use of anticoagulants: Secondary | ICD-10-CM | POA: Diagnosis not present

## 2013-01-22 LAB — POCT INR: INR: 2.5

## 2013-01-23 ENCOUNTER — Ambulatory Visit (INDEPENDENT_AMBULATORY_CARE_PROVIDER_SITE_OTHER): Payer: Medicare Other | Admitting: Cardiology

## 2013-01-23 ENCOUNTER — Encounter: Payer: Self-pay | Admitting: Cardiology

## 2013-01-23 VITALS — BP 110/60 | HR 58 | Ht 70.0 in | Wt 157.0 lb

## 2013-01-23 DIAGNOSIS — Z954 Presence of other heart-valve replacement: Secondary | ICD-10-CM

## 2013-01-23 DIAGNOSIS — Z951 Presence of aortocoronary bypass graft: Secondary | ICD-10-CM

## 2013-01-23 DIAGNOSIS — I119 Hypertensive heart disease without heart failure: Secondary | ICD-10-CM

## 2013-01-23 DIAGNOSIS — Z952 Presence of prosthetic heart valve: Secondary | ICD-10-CM

## 2013-01-23 DIAGNOSIS — I4892 Unspecified atrial flutter: Secondary | ICD-10-CM | POA: Diagnosis not present

## 2013-01-23 NOTE — Assessment & Plan Note (Signed)
Blood pressure is remaining stable on current therapy.  No headaches.  No dizziness or syncope.

## 2013-01-23 NOTE — Assessment & Plan Note (Signed)
The patient is not having any symptoms of angina pectoris

## 2013-01-23 NOTE — Assessment & Plan Note (Signed)
The patient is not aware as to when he is in normal sinus rhythm and when he is in atrial flutter.  For this reason he will need to stay on lifelong anticoagulation.  Today he is in normal sinus rhythm

## 2013-01-23 NOTE — Patient Instructions (Signed)
Your physician recommends that you continue on your current medications as directed. Please refer to the Current Medication list given to you today.  Your physician wants you to follow-up in: 4 month ov/ekg  You will receive a reminder letter in the mail two months in advance. If you don't receive a letter, please call our office to schedule the follow-up appointment.  

## 2013-01-23 NOTE — Progress Notes (Signed)
Unknown Marvin Wagner Date of Birth:  04-Nov-1930 [address] [place], [place]  [address] [phone]  Fax   [phone]  HPI: This pleasant 79 year old gentleman is seen for a six-week followup office visit.  At his last office visit he was found to be in asymptomatic atrial flutter with controlled ventricular response.  He was started on warfarin at that time.  Our intent was to arrange for cardioversion at today's visit.  However his EKG today shows that he is back in normal sinus rhythm. He has a past history of severe aortic stenosis. On 12/17/08 he underwent aortic valve replacement with a pericardial tissue valve by Dr. Cornelius Moras. He also underwent single-vessel CABG at the time of his aortic valve replacement. The patient has been doing well. He exercises on his own. He rides an exercise bike 6 miles 3 times a week. He also does other exercises and he does a lot of yard work. Has been feeling well with no new cardiac symptoms. He does a lot of deep knee bends and has strengthened his legs.  Since last visit he has had no new cardiac symptoms.  Specifically he denies chest pain or shortness of breath.  He has not had any TIA symptoms.  He has not been aware of any palpitations or irregular heartbeat.  He does not have any past history of atrial flutter fibrillation.   Current Outpatient Prescriptions  Medication Sig Dispense Refill  . acetaminophen (TYLENOL) 500 MG tablet Take 500 mg by mouth every 6 (six) hours as needed.        Marland Kitchen atorvastatin (LIPITOR) 10 MG tablet TAKE 1 TABLET DAILY  90 tablet  1  . calcium carbonate (OS-CAL) 600 MG TABS Take 600 mg by mouth 2 (two) times daily with a meal.        . Cholecalciferol (VITAMIN D) 2000 UNITS tablet Take 2,000 Units by mouth daily.        Marland Kitchen FIBER PO Take by mouth 2 (two) times daily.        . metoprolol tartrate (LOPRESSOR) 25 MG tablet Take 0.5 tablets (12.5 mg total) by mouth daily.  90 tablet  1  . Multiple Vitamin (MULTIVITAMIN)  tablet Take 1 tablet by mouth daily.        . Protein POWD Take by mouth daily.        . vitamin C (ASCORBIC ACID) 500 MG tablet Take 500 mg by mouth daily.        Marland Kitchen warfarin (COUMADIN) 5 MG tablet Take as directed by coumadin clinic  40 tablet  3   No current facility-administered medications for this visit.    Allergies  Allergen Reactions  . Penicillins     Patient Active Problem List   Diagnosis Date Noted  . Hyperlipidemia 07/01/2010    Priority: High  . Benign hypertensive heart disease without heart failure 07/01/2010    Priority: Medium  . Long term (current) use of anticoagulants 12/19/2012  . Atrial flutter 12/14/2012  . S/P AVR 06/13/2012  . Other vitamin B12 deficiency anemia 02/29/2012  . Memory disorder 02/14/2012  . Hx of CABG 07/01/2010    History  Smoking status  . Former Smoker  . Quit date: 06/30/2006  Smokeless tobacco  . Not on file    History  Alcohol Use No    Family History  Problem Relation Age of Onset  . Ankylosing spondylitis Brother     Review of Systems: The patient denies any  heat or cold intolerance.  No weight gain or weight loss.  The patient denies headaches or blurry vision.  There is no cough or sputum production.  The patient denies dizziness.  There is no hematuria or hematochezia.  The patient denies any muscle aches or arthritis.  The patient denies any rash.  The patient denies frequent falling or instability.  There is no history of depression or anxiety.  All other systems were reviewed and are negative.   Physical Exam: Filed Vitals:   01/23/13 0902  BP: 110/60  Pulse: 58   the general appearance reveals a very fit gentleman in no distress.The head and neck exam reveals pupils equal and reactive.  Extraocular movements are full.  There is no scleral icterus.  The mouth and pharynx are normal.  The neck is supple.  The carotids reveal no bruits.  The jugular venous pressure is normal.  The  thyroid is not enlarged.   There is no lymphadenopathy.  The chest is clear to percussion and auscultation.  There are no rales or rhonchi.  Expansion of the chest is symmetrical.  The precordium is quiet.  The first heart sound is normal.  The second heart sound is physiologically split.  There is no gallop rub or click.  There is a soft flow murmur across the prosthetic aortic valve.  The pulse is irregular. There is no diastolic murmur.  There is no abnormal lift or heave.  The abdomen is soft and nontender.  The bowel sounds are normal.  The liver and spleen are not enlarged.  There are no abdominal masses.  There are no abdominal bruits.  Extremities reveal good pedal pulses.  There is no phlebitis or edema.  There is no cyanosis or clubbing.  Strength is normal and symmetrical in all extremities.  There is no lateralizing weakness.  There are no sensory deficits.  The skin is warm and dry.  There is no rash.  EKG shows normal sinus rhythm at 58 per minute with first degree AV block.  Since last tracing, atrial flutter is no longer seen.    Assessment / Plan:  Paroxysmal atrial flutter.  Presently in normal sinus rhythm again.  Chadsvasc score of 3-4.  He will need lifelong anticoagulation Plan: Recheck in 4 months for office visit and EKG.  Continue current medication.

## 2013-01-23 NOTE — Assessment & Plan Note (Signed)
The patient is not having any symptoms of CHF 

## 2013-01-29 ENCOUNTER — Ambulatory Visit (INDEPENDENT_AMBULATORY_CARE_PROVIDER_SITE_OTHER): Payer: Medicare Other | Admitting: Pharmacist

## 2013-01-29 DIAGNOSIS — Z7901 Long term (current) use of anticoagulants: Secondary | ICD-10-CM | POA: Diagnosis not present

## 2013-01-29 DIAGNOSIS — Z954 Presence of other heart-valve replacement: Secondary | ICD-10-CM

## 2013-01-29 DIAGNOSIS — I4892 Unspecified atrial flutter: Secondary | ICD-10-CM

## 2013-01-29 DIAGNOSIS — Z952 Presence of prosthetic heart valve: Secondary | ICD-10-CM

## 2013-01-29 DIAGNOSIS — Z5181 Encounter for therapeutic drug level monitoring: Secondary | ICD-10-CM | POA: Diagnosis not present

## 2013-01-29 LAB — POCT INR: INR: 2.4

## 2013-02-19 ENCOUNTER — Other Ambulatory Visit: Payer: Self-pay | Admitting: Dermatology

## 2013-02-19 DIAGNOSIS — L820 Inflamed seborrheic keratosis: Secondary | ICD-10-CM | POA: Diagnosis not present

## 2013-02-19 DIAGNOSIS — L570 Actinic keratosis: Secondary | ICD-10-CM | POA: Diagnosis not present

## 2013-02-19 DIAGNOSIS — D485 Neoplasm of uncertain behavior of skin: Secondary | ICD-10-CM | POA: Diagnosis not present

## 2013-02-26 ENCOUNTER — Ambulatory Visit (INDEPENDENT_AMBULATORY_CARE_PROVIDER_SITE_OTHER): Payer: Medicare Other | Admitting: *Deleted

## 2013-02-26 DIAGNOSIS — Z954 Presence of other heart-valve replacement: Secondary | ICD-10-CM

## 2013-02-26 DIAGNOSIS — Z7901 Long term (current) use of anticoagulants: Secondary | ICD-10-CM

## 2013-02-26 DIAGNOSIS — I4892 Unspecified atrial flutter: Secondary | ICD-10-CM | POA: Diagnosis not present

## 2013-02-26 DIAGNOSIS — Z952 Presence of prosthetic heart valve: Secondary | ICD-10-CM

## 2013-02-26 DIAGNOSIS — Z5181 Encounter for therapeutic drug level monitoring: Secondary | ICD-10-CM

## 2013-02-26 LAB — POCT INR: INR: 1.8

## 2013-03-12 ENCOUNTER — Ambulatory Visit (INDEPENDENT_AMBULATORY_CARE_PROVIDER_SITE_OTHER): Payer: Medicare Other | Admitting: *Deleted

## 2013-03-12 DIAGNOSIS — I4892 Unspecified atrial flutter: Secondary | ICD-10-CM

## 2013-03-12 DIAGNOSIS — Z7901 Long term (current) use of anticoagulants: Secondary | ICD-10-CM

## 2013-03-12 DIAGNOSIS — Z954 Presence of other heart-valve replacement: Secondary | ICD-10-CM | POA: Diagnosis not present

## 2013-03-12 DIAGNOSIS — Z5181 Encounter for therapeutic drug level monitoring: Secondary | ICD-10-CM | POA: Diagnosis not present

## 2013-03-12 DIAGNOSIS — Z952 Presence of prosthetic heart valve: Secondary | ICD-10-CM

## 2013-03-12 LAB — POCT INR: INR: 2

## 2013-04-04 ENCOUNTER — Ambulatory Visit (INDEPENDENT_AMBULATORY_CARE_PROVIDER_SITE_OTHER): Payer: Medicare Other | Admitting: Pharmacist

## 2013-04-04 DIAGNOSIS — I4892 Unspecified atrial flutter: Secondary | ICD-10-CM | POA: Diagnosis not present

## 2013-04-04 DIAGNOSIS — Z5181 Encounter for therapeutic drug level monitoring: Secondary | ICD-10-CM | POA: Diagnosis not present

## 2013-04-04 DIAGNOSIS — Z7901 Long term (current) use of anticoagulants: Secondary | ICD-10-CM | POA: Diagnosis not present

## 2013-04-04 DIAGNOSIS — Z952 Presence of prosthetic heart valve: Secondary | ICD-10-CM

## 2013-04-04 DIAGNOSIS — Z954 Presence of other heart-valve replacement: Secondary | ICD-10-CM

## 2013-04-04 LAB — POCT INR: INR: 4.6

## 2013-04-18 ENCOUNTER — Ambulatory Visit (INDEPENDENT_AMBULATORY_CARE_PROVIDER_SITE_OTHER): Payer: Medicare Other | Admitting: Pharmacist

## 2013-04-18 DIAGNOSIS — I4892 Unspecified atrial flutter: Secondary | ICD-10-CM | POA: Diagnosis not present

## 2013-04-18 DIAGNOSIS — Z954 Presence of other heart-valve replacement: Secondary | ICD-10-CM

## 2013-04-18 DIAGNOSIS — Z5181 Encounter for therapeutic drug level monitoring: Secondary | ICD-10-CM

## 2013-04-18 DIAGNOSIS — Z7901 Long term (current) use of anticoagulants: Secondary | ICD-10-CM | POA: Diagnosis not present

## 2013-04-18 DIAGNOSIS — Z952 Presence of prosthetic heart valve: Secondary | ICD-10-CM

## 2013-04-18 LAB — POCT INR: INR: 2.1

## 2013-04-25 ENCOUNTER — Ambulatory Visit (INDEPENDENT_AMBULATORY_CARE_PROVIDER_SITE_OTHER): Payer: Medicare Other | Admitting: Family Medicine

## 2013-04-25 ENCOUNTER — Encounter: Payer: Self-pay | Admitting: Family Medicine

## 2013-04-25 VITALS — BP 130/62 | HR 62 | Wt 160.0 lb

## 2013-04-25 DIAGNOSIS — I4892 Unspecified atrial flutter: Secondary | ICD-10-CM | POA: Diagnosis not present

## 2013-04-25 DIAGNOSIS — Z7901 Long term (current) use of anticoagulants: Secondary | ICD-10-CM

## 2013-04-25 DIAGNOSIS — E785 Hyperlipidemia, unspecified: Secondary | ICD-10-CM

## 2013-04-25 DIAGNOSIS — Z23 Encounter for immunization: Secondary | ICD-10-CM | POA: Diagnosis not present

## 2013-04-25 DIAGNOSIS — Z87898 Personal history of other specified conditions: Secondary | ICD-10-CM

## 2013-04-25 NOTE — Progress Notes (Signed)
Pre visit review using our clinic review tool, if applicable. No additional management support is needed unless otherwise documented below in the visit note. 

## 2013-04-25 NOTE — Progress Notes (Signed)
   Subjective:    Patient ID: Marvin Wagner, male    DOB: May 14, 1930, 80 y.o.   MRN: [id number]  HPI Patient seen to establish care. Past medical history reviewed. His chronic problems include history of CAD with previous coronary artery bypass graft. He's had aortic valve replacement reportedly secondary to aortic stenosis history. Other problems include history of hyperlipidemia, transient atrial flutter. He is maintained on chronic Coumadin. He is generally very active with things like gardening. 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 Nonsmoker.  Echocardiogram back in December ejection fraction 60-65%. Denies any recent chest pains. No dizziness. No bleeding complications. He had labs done last November with lipids and hepatic panel which were normal. Denies any recent falls.  He relates he's had elevated PSAs in the past with negative prostate biopsies.  Past Medical History  Diagnosis Date  . Aortic stenosis     SEVERE  . Heart murmur   . Hypertension   . Hyperlipidemia   . Aortic insufficiency and aortic stenosis   . Mild memory disturbance   . CAD (coronary artery disease)    Past Surgical History  Procedure Laterality Date  . Cardiac catheterization    . Coronary angioplasty      NORMAL LEFT VENTRICULAR SIZEAND CONTRACTILITY WITH NORMAL SYSTOLIC FUNCTION. EF 65%  . Aortic valve replacement  12/17/08  . Tonsillectomy      reports that he quit smoking about 6 years ago. He does not have any smokeless tobacco history on file. He reports that he does not drink alcohol or use illicit drugs. family history includes Ankylosing spondylitis in his brother. Allergies  Allergen Reactions  . Penicillins       Review of Systems  Constitutional: Negative for fever, chills, fatigue and unexpected weight change.  Eyes: Negative for visual disturbance.  Respiratory: Negative for cough, chest tightness and shortness of breath.     Cardiovascular: Negative for chest pain, palpitations and leg swelling.  Endocrine: Negative for polydipsia and polyuria.  Genitourinary: Negative for dysuria.  Neurological: Negative for dizziness, syncope, weakness, light-headedness and headaches.       Objective:   Physical Exam  Constitutional: He is oriented to person, place, and time. He appears well-developed and well-nourished.  Neck: Neck supple. No thyromegaly present.  Cardiovascular:  Occasional premature beats otherwise normal  Pulmonary/Chest: Effort normal and breath sounds normal. No respiratory distress. He has no wheezes. He has no rales.  Musculoskeletal: He exhibits no edema.  Lymphadenopathy:    He has no cervical adenopathy.  Neurological: He is alert and oriented to person, place, and time.  Psychiatric: He has a normal mood and affect. His behavior is normal.          Assessment & Plan:  #1 history of CAD with previous CABG and aortic valve replacement. He is followed regularly by cardiology. #2 history of hyperlipidemia- on atorvastatin. Recent lipids at goal #3 history of transient atrial fib/flutter. Currently appears to be in sinus rhythm #4 history of reported elevated PSA with negative biopsies. We've recommended no further PSAs at his age #5 health maintenance. Prevnar 13 given. Continue yearly flu vaccine

## 2013-05-09 ENCOUNTER — Ambulatory Visit (INDEPENDENT_AMBULATORY_CARE_PROVIDER_SITE_OTHER): Payer: Medicare Other | Admitting: Pharmacist Clinician (PhC)/ Clinical Pharmacy Specialist

## 2013-05-09 DIAGNOSIS — I4892 Unspecified atrial flutter: Secondary | ICD-10-CM

## 2013-05-09 DIAGNOSIS — Z5181 Encounter for therapeutic drug level monitoring: Secondary | ICD-10-CM | POA: Diagnosis not present

## 2013-05-09 DIAGNOSIS — Z954 Presence of other heart-valve replacement: Secondary | ICD-10-CM

## 2013-05-09 DIAGNOSIS — Z7901 Long term (current) use of anticoagulants: Secondary | ICD-10-CM | POA: Diagnosis not present

## 2013-05-09 DIAGNOSIS — Z952 Presence of prosthetic heart valve: Secondary | ICD-10-CM

## 2013-05-09 LAB — POCT INR: INR: 2.1

## 2013-05-22 ENCOUNTER — Other Ambulatory Visit: Payer: Self-pay | Admitting: Dermatology

## 2013-05-22 DIAGNOSIS — L723 Sebaceous cyst: Secondary | ICD-10-CM | POA: Diagnosis not present

## 2013-05-22 DIAGNOSIS — D485 Neoplasm of uncertain behavior of skin: Secondary | ICD-10-CM | POA: Diagnosis not present

## 2013-06-06 ENCOUNTER — Ambulatory Visit (INDEPENDENT_AMBULATORY_CARE_PROVIDER_SITE_OTHER): Payer: Medicare Other

## 2013-06-06 DIAGNOSIS — I4892 Unspecified atrial flutter: Secondary | ICD-10-CM | POA: Diagnosis not present

## 2013-06-06 DIAGNOSIS — Z954 Presence of other heart-valve replacement: Secondary | ICD-10-CM | POA: Diagnosis not present

## 2013-06-06 DIAGNOSIS — Z7901 Long term (current) use of anticoagulants: Secondary | ICD-10-CM

## 2013-06-06 DIAGNOSIS — Z952 Presence of prosthetic heart valve: Secondary | ICD-10-CM

## 2013-06-06 DIAGNOSIS — Z5181 Encounter for therapeutic drug level monitoring: Secondary | ICD-10-CM

## 2013-06-06 LAB — POCT INR: INR: 2.3

## 2013-06-12 ENCOUNTER — Other Ambulatory Visit: Payer: Self-pay | Admitting: Cardiology

## 2013-06-14 ENCOUNTER — Other Ambulatory Visit: Payer: Self-pay | Admitting: Cardiology

## 2013-07-18 ENCOUNTER — Ambulatory Visit (INDEPENDENT_AMBULATORY_CARE_PROVIDER_SITE_OTHER): Payer: Medicare Other | Admitting: *Deleted

## 2013-07-18 DIAGNOSIS — Z954 Presence of other heart-valve replacement: Secondary | ICD-10-CM | POA: Diagnosis not present

## 2013-07-18 DIAGNOSIS — Z7901 Long term (current) use of anticoagulants: Secondary | ICD-10-CM

## 2013-07-18 DIAGNOSIS — Z5181 Encounter for therapeutic drug level monitoring: Secondary | ICD-10-CM | POA: Diagnosis not present

## 2013-07-18 DIAGNOSIS — I4892 Unspecified atrial flutter: Secondary | ICD-10-CM

## 2013-07-18 DIAGNOSIS — Z952 Presence of prosthetic heart valve: Secondary | ICD-10-CM

## 2013-07-18 LAB — POCT INR: INR: 2.5

## 2013-08-02 ENCOUNTER — Other Ambulatory Visit: Payer: Self-pay | Admitting: Cardiology

## 2013-08-28 ENCOUNTER — Other Ambulatory Visit: Payer: Self-pay | Admitting: Cardiology

## 2013-08-29 ENCOUNTER — Ambulatory Visit (INDEPENDENT_AMBULATORY_CARE_PROVIDER_SITE_OTHER): Payer: Medicare Other | Admitting: *Deleted

## 2013-08-29 DIAGNOSIS — Z5181 Encounter for therapeutic drug level monitoring: Secondary | ICD-10-CM | POA: Diagnosis not present

## 2013-08-29 DIAGNOSIS — Z7901 Long term (current) use of anticoagulants: Secondary | ICD-10-CM | POA: Diagnosis not present

## 2013-08-29 DIAGNOSIS — Z954 Presence of other heart-valve replacement: Secondary | ICD-10-CM

## 2013-08-29 DIAGNOSIS — I4892 Unspecified atrial flutter: Secondary | ICD-10-CM | POA: Diagnosis not present

## 2013-08-29 DIAGNOSIS — Z952 Presence of prosthetic heart valve: Secondary | ICD-10-CM

## 2013-08-29 LAB — POCT INR: INR: 2.5

## 2013-09-11 ENCOUNTER — Other Ambulatory Visit: Payer: Self-pay | Admitting: Cardiology

## 2013-09-17 ENCOUNTER — Other Ambulatory Visit: Payer: Self-pay | Admitting: Cardiology

## 2013-09-17 NOTE — Telephone Encounter (Signed)
Refill has been done on August 12th and confirmed by Pharmacy

## 2013-09-26 DIAGNOSIS — H2510 Age-related nuclear cataract, unspecified eye: Secondary | ICD-10-CM | POA: Diagnosis not present

## 2013-10-04 DIAGNOSIS — Z23 Encounter for immunization: Secondary | ICD-10-CM | POA: Diagnosis not present

## 2013-10-10 ENCOUNTER — Ambulatory Visit (INDEPENDENT_AMBULATORY_CARE_PROVIDER_SITE_OTHER): Payer: Medicare Other | Admitting: *Deleted

## 2013-10-10 DIAGNOSIS — Z7901 Long term (current) use of anticoagulants: Secondary | ICD-10-CM

## 2013-10-10 DIAGNOSIS — Z954 Presence of other heart-valve replacement: Secondary | ICD-10-CM

## 2013-10-10 DIAGNOSIS — Z5181 Encounter for therapeutic drug level monitoring: Secondary | ICD-10-CM

## 2013-10-10 DIAGNOSIS — Z952 Presence of prosthetic heart valve: Secondary | ICD-10-CM

## 2013-10-10 DIAGNOSIS — I4892 Unspecified atrial flutter: Secondary | ICD-10-CM | POA: Diagnosis not present

## 2013-10-10 LAB — POCT INR: INR: 2.3

## 2013-10-17 ENCOUNTER — Ambulatory Visit (INDEPENDENT_AMBULATORY_CARE_PROVIDER_SITE_OTHER): Payer: Medicare Other | Admitting: Cardiology

## 2013-10-17 VITALS — BP 130/66 | HR 70 | Ht 69.0 in | Wt 147.0 lb

## 2013-10-17 DIAGNOSIS — I119 Hypertensive heart disease without heart failure: Secondary | ICD-10-CM | POA: Diagnosis not present

## 2013-10-17 DIAGNOSIS — Z954 Presence of other heart-valve replacement: Secondary | ICD-10-CM | POA: Diagnosis not present

## 2013-10-17 DIAGNOSIS — Z951 Presence of aortocoronary bypass graft: Secondary | ICD-10-CM

## 2013-10-17 DIAGNOSIS — Z952 Presence of prosthetic heart valve: Secondary | ICD-10-CM

## 2013-10-17 DIAGNOSIS — R634 Abnormal weight loss: Secondary | ICD-10-CM

## 2013-10-17 DIAGNOSIS — R413 Other amnesia: Secondary | ICD-10-CM

## 2013-10-17 DIAGNOSIS — I4892 Unspecified atrial flutter: Secondary | ICD-10-CM

## 2013-10-17 LAB — LIPID PANEL
CHOLESTEROL: 111 mg/dL (ref 0–200)
HDL: 43.1 mg/dL (ref >39.00)
LDL CALC: 53 mg/dL (ref 0–99)
NonHDL: 67.9
TRIGLYCERIDES: 75 mg/dL (ref 0.0–149.0)
Total CHOL/HDL Ratio: 3
VLDL: 15 mg/dL (ref 0.0–40.0)

## 2013-10-17 LAB — HEPATIC FUNCTION PANEL
ALK PHOS: 49 U/L (ref 39–117)
ALT: 15 U/L (ref 0–53)
AST: 26 U/L (ref 0–37)
Albumin: 3.7 g/dL (ref 3.5–5.2)
BILIRUBIN DIRECT: 0 mg/dL (ref 0.0–0.3)
TOTAL PROTEIN: 6.8 g/dL (ref 6.0–8.3)
Total Bilirubin: 0.5 mg/dL (ref 0.2–1.2)

## 2013-10-17 LAB — CBC WITH DIFFERENTIAL/PLATELET
Basophils Absolute: 0 10*3/uL (ref 0.0–0.1)
Basophils Relative: 0.4 % (ref 0.0–3.0)
EOS PCT: 0.5 % (ref 0.0–5.0)
Eosinophils Absolute: 0 10*3/uL (ref 0.0–0.7)
HCT: 37.1 % — ABNORMAL LOW (ref 39.0–52.0)
Hemoglobin: 12.6 g/dL — ABNORMAL LOW (ref 13.0–17.0)
LYMPHS PCT: 15.1 % (ref 12.0–46.0)
Lymphs Abs: 1.2 10*3/uL (ref 0.7–4.0)
MCHC: 34.1 g/dL (ref 30.0–36.0)
MCV: 99.4 fl (ref 78.0–100.0)
MONOS PCT: 6.8 % (ref 3.0–12.0)
Monocytes Absolute: 0.5 10*3/uL (ref 0.1–1.0)
NEUTROS PCT: 77.2 % — AB (ref 43.0–77.0)
Neutro Abs: 6.1 10*3/uL (ref 1.4–7.7)
Platelets: 216 10*3/uL (ref 150.0–400.0)
RBC: 3.73 Mil/uL — ABNORMAL LOW (ref 4.22–5.81)
RDW: 12.8 % (ref 11.5–15.5)
WBC: 8 10*3/uL (ref 4.0–10.5)

## 2013-10-17 LAB — BASIC METABOLIC PANEL
BUN: 21 mg/dL (ref 6–23)
CALCIUM: 8.9 mg/dL (ref 8.4–10.5)
CO2: 30 mEq/L (ref 19–32)
CREATININE: 0.9 mg/dL (ref 0.4–1.5)
Chloride: 102 mEq/L (ref 96–112)
GFR: 89.53 mL/min (ref >60.00)
GLUCOSE: 95 mg/dL (ref 70–99)
POTASSIUM: 4.3 meq/L (ref 3.5–5.1)
Sodium: 140 mEq/L (ref 135–145)

## 2013-10-17 LAB — TSH: TSH: 0.38 u[IU]/mL (ref 0.35–4.50)

## 2013-10-17 NOTE — Assessment & Plan Note (Addendum)
Patient has problems with his memory.  We discussed this with him in January 2014.  The plan was for him to have seen Dr. Jannifer Franklin who also sees his wife.

## 2013-10-17 NOTE — Assessment & Plan Note (Signed)
The patient has had unintentional weight loss of 10 pounds since we last saw him.  He has not had any change in bowel habits.  No melena.  No fever chills or night sweats.  No cough. He has not been using his exercise bicycle as regularly and his weight loss may be due in part to loss of muscle mass.  We are checking full lab work today including CBC and thyroid function.

## 2013-10-17 NOTE — Assessment & Plan Note (Signed)
Blood pressure is remaining stable on current medication.  The patient has not had any symptoms of heart failure.

## 2013-10-17 NOTE — Patient Instructions (Signed)
Will obtain labs today and call you with the results (LP/BMET/HFP/CBC/TSH)  Your physician recommends that you continue on your current medications as directed. Please refer to the Current Medication list given to you today.  Your physician wants you to follow-up in: [place] will receive a reminder letter in the mail two months in advance. If you don't receive a letter, please call our office to schedule the follow-up appointment.

## 2013-10-17 NOTE — Progress Notes (Signed)
Marvin Wagner Date of Birth:  January 10, 1931 [facility] [address] [place], [place]  [address] [phone]        Fax   [phone]   History of Present Illness: This pleasant 80 year old gentleman is seen for a scheduled followup office visit. He has a past history of severe aortic stenosis. On 12/17/08 he underwent aortic valve replacement with a pericardial tissue valve by Dr. Roxy Wagner. He also underwent single-vessel CABG at the time of his aortic valve replacement. The patient has been doing well. He exercises on his own. He rides an exercise bike 6 miles 3 times a week. He also does other exercises and he does a lot of yard work. Has been feeling well with no new cardiac symptoms. He does a lot of deep knee bends and has strengthened his legs. Since last visit he has had no new cardiac symptoms. Specifically he denies chest pain or shortness of breath. He has not had any TIA symptoms. He has not been aware of any palpitations or irregular heartbeat. He does not have any past history of atrial flutter fibrillation.  In November 2014 the patient was found to be in asymptomatic atrial flutter with controlled ventricular response and has been on warfarin since that time.  Outpatient cardioversion was planned.  However when the patient returned on 01/23/13 to discuss cardioversion it was noted that he had gone back into normal rhythm on his own.  He has continued on warfarin.  He himself is not aware of when he goes in and out of atrial flutter.   Current Outpatient Prescriptions  Medication Sig Dispense Refill  . acetaminophen (TYLENOL) 500 MG tablet Take 500 mg by mouth every 6 (six) hours as needed.        Marland Kitchen atorvastatin (LIPITOR) 10 MG tablet TAKE 1 TABLET DAILY (NEEDS APPOINTMENT WITH DOCTOR Marvin Wagner)  30 tablet  0  . calcium carbonate (OS-CAL) 600 MG TABS Take 600 mg by mouth 2 (two) times daily with a meal.        . Cholecalciferol (VITAMIN D) 2000 UNITS tablet Take  2,000 Units by mouth daily.        Marland Kitchen FIBER PO Take by mouth 2 (two) times daily.        . metoprolol tartrate (LOPRESSOR) 25 MG tablet Take 0.5 tablets (12.5 mg total) by mouth daily.  90 tablet  1  . Multiple Vitamin (MULTIVITAMIN) tablet Take 1 tablet by mouth daily.        . Protein POWD Take by mouth daily.        . vitamin C (ASCORBIC ACID) 500 MG tablet Take 500 mg by mouth daily.        Marland Kitchen warfarin (COUMADIN) 5 MG tablet Take as directed by Anticoagulation clinic.  Pt takes up to 1 1/2 tablets daily.  40 tablet  1   No current facility-administered medications for this visit.    Allergies  Allergen Reactions  . Penicillins     Patient Active Problem List   Diagnosis Date Noted  . Hyperlipidemia 07/01/2010    Priority: High  . Benign hypertensive heart disease without heart failure 07/01/2010    Priority: Medium  . Weight loss, unintentional 10/17/2013  . History of elevated PSA 04/25/2013  . Encounter for therapeutic drug monitoring 02/26/2013  . Long term (current) use of anticoagulants 12/19/2012  . Atrial flutter 12/14/2012  . S/P AVR 06/13/2012  . Other vitamin B12 deficiency anemia 02/29/2012  .  Memory disorder 02/14/2012  . Hx of CABG 07/01/2010    History  Smoking status  . Former Smoker  . Quit date: 06/30/2006  Smokeless tobacco  . Not on file    History  Alcohol Use No    Family History  Problem Relation Age of Onset  . Ankylosing spondylitis Brother     Review of Systems: Constitutional: no fever chills diaphoresis or fatigue or change in weight.  Head and neck: no hearing loss, no epistaxis, no photophobia or visual disturbance. Respiratory: No cough, shortness of breath or wheezing. Cardiovascular: No chest pain peripheral edema, palpitations. Gastrointestinal: No abdominal distention, no abdominal pain, no change in bowel habits hematochezia or melena. Genitourinary: No dysuria, no frequency, no urgency, no nocturia. Musculoskeletal:No  arthralgias, no back pain, no gait disturbance or myalgias. Neurological: No dizziness, no headaches, no numbness, no seizures, no syncope, no weakness, no tremors. Hematologic: No lymphadenopathy, no easy bruising. Psychiatric: No confusion, no hallucinations, no sleep disturbance.    Physical Exam: Filed Vitals:   10/17/13 1112  BP: 130/66  Pulse: 70  The patient appears to be in no distress.  Head and neck exam reveals that the pupils are equal and reactive.  The extraocular movements are full.  There is no scleral icterus.  Mouth and pharynx are benign.  No lymphadenopathy.  No carotid bruits.  The jugular venous pressure is normal.  Thyroid is not enlarged or tender.  Chest is clear to percussion and auscultation.  No rales or rhonchi.  Expansion of the chest is symmetrical.  Heart reveals no abnormal lift or heave.  First and second heart sounds are normal.  The pulse is regular.  There is no  gallop rub or click.  There is a faint systolic flow murmur across the aortic prosthetic valve.  No diastolic murmur.  The abdomen is soft and nontender.  Bowel sounds are normoactive.  There is no hepatosplenomegaly or mass.  There are no abdominal bruits.  Extremities reveal no phlebitis or edema.  Pedal pulses are good.  There is no cyanosis or clubbing.  Neurologic exam is normal strength and no lateralizing weakness.  No sensory deficits.  Decreased memory noted.  Integument reveals no rash    Assessment / Plan: 1.  Paroxysmal atrial flutter.  On long-term Coumadin.  Chadssvasc score of 3-4 2. hypertensive cardiovascular disease without heart failure. 3. history of CABG on 12/17/08.  No recurrent chest pain 4. history of aortic valve replacement with pericardial tissue valve on 12/17/08 5. unintentional weight loss 6. memory disorder  Disposition: Continue current medication.  We will get lab work to evaluate weight loss. Recheck in 4 months for office visit.

## 2013-10-17 NOTE — Assessment & Plan Note (Signed)
The patient has not been aware of any irregularity of his pulse.  He has not had any TIA symptoms.  He remains on warfarin.

## 2013-10-18 ENCOUNTER — Telehealth: Payer: Self-pay | Admitting: Cardiology

## 2013-10-18 NOTE — Telephone Encounter (Signed)
NA or voicemail

## 2013-10-18 NOTE — Progress Notes (Signed)
Quick Note:  Please report to patient. The recent labs are stable. Continue same medication and careful diet. ______ 

## 2013-10-18 NOTE — Telephone Encounter (Signed)
**Note De-Identified  Obfuscation** The pt is unclear about OV instructions from Dr Mare Ferrari on 9/17. I went over his instructions with him and he verbalized understanding. He is requesting a call back from [facility], Dr Bobbye Riggs nurse.

## 2013-10-18 NOTE — Telephone Encounter (Signed)
°

## 2013-10-21 NOTE — Telephone Encounter (Signed)
Left message to call back  

## 2013-10-21 NOTE — Telephone Encounter (Signed)
F/u ° ° °Pt returning your call °

## 2013-10-21 NOTE — Telephone Encounter (Signed)
Message copied by Earvin Hansen on Mon Oct 21, 2013 11:38 AM ------      Message from: Darlin Coco      Created: Fri Oct 18, 2013  2:23 PM       Please report to patient.  The recent labs are stable. Continue same medication and careful diet. ------

## 2013-10-21 NOTE — Telephone Encounter (Signed)
Advised patient of lab results and to continue same dose of medications

## 2013-10-30 ENCOUNTER — Other Ambulatory Visit: Payer: Self-pay | Admitting: Cardiology

## 2013-11-15 ENCOUNTER — Other Ambulatory Visit: Payer: Self-pay

## 2013-11-21 ENCOUNTER — Telehealth: Payer: Self-pay | Admitting: Family Medicine

## 2013-11-21 NOTE — Telephone Encounter (Signed)
Ok to have INR done at [place] [facility]. Pt needs to be scheduled for a coumadin appt for the same day
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
## 2013-11-22 ENCOUNTER — Ambulatory Visit (INDEPENDENT_AMBULATORY_CARE_PROVIDER_SITE_OTHER): Payer: Medicare Other | Admitting: Family

## 2013-11-22 ENCOUNTER — Ambulatory Visit (INDEPENDENT_AMBULATORY_CARE_PROVIDER_SITE_OTHER): Payer: Medicare Other | Admitting: Family Medicine

## 2013-11-22 ENCOUNTER — Encounter: Payer: Self-pay | Admitting: Family Medicine

## 2013-11-22 VITALS — BP 124/66 | HR 60 | Temp 98.0°F | Wt 146.0 lb

## 2013-11-22 DIAGNOSIS — R413 Other amnesia: Secondary | ICD-10-CM

## 2013-11-22 DIAGNOSIS — Z5181 Encounter for therapeutic drug level monitoring: Secondary | ICD-10-CM

## 2013-11-22 DIAGNOSIS — Z952 Presence of prosthetic heart valve: Secondary | ICD-10-CM

## 2013-11-22 DIAGNOSIS — I4892 Unspecified atrial flutter: Secondary | ICD-10-CM

## 2013-11-22 LAB — POCT INR: INR: 1.9

## 2013-11-22 MED ORDER — MEMANTINE HCL 28 X 5 MG & 21 X 10 MG PO TABS: ORAL_TABLET | ORAL | Status: DC

## 2013-11-22 NOTE — Patient Instructions (Signed)
Memantine Tablets  What is this medicine?  MEMANTINE (MEM an teen) is used to treat dementia caused by Alzheimer's disease.  This medicine may be used for other purposes; ask your health care provider or pharmacist if you have questions.  COMMON BRAND NAME(S): Namenda  What should I tell my health care provider before I take this medicine?  They need to know if you have any of these conditions:  -difficulty passing urine  -kidney disease  -liver disease  -seizures  -an unusual or allergic reaction to memantine, other medicines, foods, dyes, or preservatives  -pregnant or trying to get pregnant  -breast-feeding  How should I use this medicine?  Take this medicine by mouth with a glass of water. Follow the directions on the prescription label. You may take this medicine with or without food. Take your doses at regular intervals. Do not take your medicine more often than directed. Continue to take your medicine even if you feel better. Do not stop taking except on the advice of your doctor or health care professional.  Talk to your pediatrician regarding the use of this medicine in children. Special care may be needed.  Overdosage: If you think you have taken too much of this medicine contact a poison control center or emergency room at once.  NOTE: This medicine is only for you. Do not share this medicine with others.  What if I miss a dose?  If you miss a dose, take it as soon as you can. If it is almost time for your next dose, take only that dose. Do not take double or extra doses. If you do not take your medicine for several days, contact your health care provider. Your dose may need to be changed.  What may interact with this medicine?  -acetazolamide  -amantadine  -cimetidine  -dextromethorphan  -dofetilide  -hydrochlorothiazide  -ketamine  -metformin  -methazolamide  -quinidine  -ranitidine  -sodium bicarbonate  -triamterene  This list may not describe all possible interactions. Give your health care provider a  list of all the medicines, herbs, non-prescription drugs, or dietary supplements you use. Also tell them if you smoke, drink alcohol, or use illegal drugs. Some items may interact with your medicine.  What should I watch for while using this medicine?  Visit your doctor or health care professional for regular checks on your progress. Check with your doctor or health care professional if there is no improvement in your symptoms or if they get worse.  You may get drowsy or dizzy. Do not drive, use machinery, or do anything that needs mental alertness until you know how this drug affects you. Do not stand or sit up quickly, especially if you are an older patient. This reduces the risk of dizzy or fainting spells. Alcohol can make you more drowsy and dizzy. Avoid alcoholic drinks.  What side effects may I notice from receiving this medicine?  Side effects that you should report to your doctor or health care professional as soon as possible:  -allergic reactions like skin rash, itching or hives, swelling of the face, lips, or tongue  -agitation or a feeling of restlessness  -depressed mood  -dizziness  -hallucinations  -redness, blistering, peeling or loosening of the skin, including inside the mouth  -seizures  -vomiting  Side effects that usually do not require medical attention (report to your doctor or health care professional if they continue or are bothersome):  -constipation  -diarrhea  -headache  -nausea  -trouble sleeping  This   list may not describe all possible side effects. Call your doctor for medical advice about side effects. You may report side effects to FDA at 1-800-FDA-1088.  Where should I keep my medicine?  Keep out of the reach of children.  Store at room temperature between 15 degrees and 30 degrees C (59 degrees and 86 degrees F). Throw away any unused medicine after the expiration date.  NOTE: This sheet is a summary. It may not cover all possible information. If you have questions about this  medicine, talk to your doctor, pharmacist, or health care provider.  © 2015, [employer]. (2012-11-05 14:10:42)

## 2013-11-22 NOTE — Progress Notes (Signed)
Pre visit review using our clinic review tool, if applicable. No additional management support is needed unless otherwise documented below in the visit note. 

## 2013-11-22 NOTE — Progress Notes (Signed)
Subjective:    Patient ID: Marvin Wagner, male    DOB: [birth year], 80 y.o.   MRN: [id number]  HPI 80 year old male with past medical history pertinent for memory disorder and history of CABG presents today with his daughter for short term memory loss issues.  He says that he forgets things easily unless he writes them down.  Patient is still driving and states a few times he has needed to pull over on the side of the road to remember how to get to his destination.  States his appetite is still good and that he takes his medications as prescribed.  His daughter is worried about him driving, spending money online that he does not recall and remembering medication regimens.      Review of Systems  Constitutional: Negative for fever, chills, activity change, appetite change and fatigue.       Has lost weight in the past year.   Respiratory: Negative for cough and shortness of breath.   Cardiovascular: Negative for chest pain.  Gastrointestinal: Negative for nausea, vomiting, abdominal pain, diarrhea and constipation.  Musculoskeletal: Negative for arthralgias, gait problem and joint swelling.  Neurological: Negative for dizziness, speech difficulty, weakness, light-headedness, numbness and headaches.  Psychiatric/Behavioral: Positive for confusion. Negative for sleep disturbance.   Past Medical History  Diagnosis Date  . Aortic stenosis     SEVERE  . Heart murmur   . Hypertension   . Hyperlipidemia   . Aortic insufficiency and aortic stenosis   . Mild memory disturbance   . CAD (coronary artery disease)    Family History  Problem Relation Age of Onset  . Ankylosing spondylitis Brother    History   Social History  . Marital Status: Married    Spouse Name: N/A    Number of Children: 2  . Years of Education: Masters   Occupational History  . Retired    Social History Main Topics  . Smoking status: Former Smoker    Quit date: 06/30/2006  . Smokeless tobacco: Not on file  .  Alcohol Use: No  . Drug Use: No  . Sexual Activity:    Other Topics Concern  . Not on file   Social History Narrative  . No narrative on file       Objective:   Physical Exam  Nursing note and vitals reviewed. Constitutional: He is oriented to person, place, and time. He appears well-developed. No distress.  Thin appearing man for his tall height.   Cardiovascular: Normal rate, regular rhythm and normal heart sounds.   No murmur heard. Pulmonary/Chest: Effort normal and breath sounds normal. No respiratory distress. He has no wheezes.  Neurological: He is alert and oriented to person, place, and time. No cranial nerve deficit.  Skin: He is not diaphoretic.  Psychiatric: He has a normal mood and affect. His behavior is normal. Judgment and thought content normal.          Assessment & Plan:  Dementia- Patient was started on memantine tablet pack.  He has had slow progressing declines in memory.  He agrees that he would like to try a medication for his symptoms.  He has had an MRI in the past to rule out other causes.  He scored a 26/30 on MMS and was able to name 20 animals in 60 seconds (previousl scored 27/30 on MMS and named 20 animals).     Joseph Art PA-S  As above. Pt had workup per neurology in 2014 (reviewed)  with normal B12 and TSH and MRI brain no acute findings.  Gradual loss of short term memory and do suspect early Alzheimers.  Spoke with pt and daughter and  Have recommended trial of Namenda -starter pack and re-evaluate in one month.  They agree.   Family looking at possible other living arrangements for husband and wife in near future with recent health changes.  Carolann Littler MD

## 2013-11-22 NOTE — Patient Instructions (Signed)
Take an extra 1/2 tab today only. Then continue taking 1 tablet every day except 1.5 tablets on Wednesdays.    Anticoagulation Dose Instructions as of 11/22/2013     Dorene Grebe Tue Wed Thu Fri Sat   New Dose 5 mg 5 mg 5 mg 7.5 mg 5 mg 5 mg 5 mg    Description       Take an extra 1/2 tab today only. Then continue taking 1 tablet every day except 1.5 tablets on Wednesdays.

## 2013-11-22 NOTE — Telephone Encounter (Signed)
sd coum appt made.

## 2013-12-21 ENCOUNTER — Other Ambulatory Visit: Payer: Self-pay | Admitting: Cardiology

## 2013-12-23 ENCOUNTER — Ambulatory Visit (INDEPENDENT_AMBULATORY_CARE_PROVIDER_SITE_OTHER): Payer: Medicare Other | Admitting: Family Medicine

## 2013-12-23 ENCOUNTER — Ambulatory Visit (INDEPENDENT_AMBULATORY_CARE_PROVIDER_SITE_OTHER): Payer: Medicare Other | Admitting: Family

## 2013-12-23 ENCOUNTER — Encounter: Payer: Self-pay | Admitting: Family Medicine

## 2013-12-23 VITALS — BP 126/66 | HR 64 | Temp 97.4°F | Wt 149.0 lb

## 2013-12-23 DIAGNOSIS — Z954 Presence of other heart-valve replacement: Secondary | ICD-10-CM

## 2013-12-23 DIAGNOSIS — I4892 Unspecified atrial flutter: Secondary | ICD-10-CM | POA: Diagnosis not present

## 2013-12-23 DIAGNOSIS — Z5181 Encounter for therapeutic drug level monitoring: Secondary | ICD-10-CM | POA: Diagnosis not present

## 2013-12-23 DIAGNOSIS — R413 Other amnesia: Secondary | ICD-10-CM | POA: Diagnosis not present

## 2013-12-23 DIAGNOSIS — Z952 Presence of prosthetic heart valve: Secondary | ICD-10-CM

## 2013-12-23 LAB — POCT INR: INR: 1.2

## 2013-12-23 MED ORDER — MEMANTINE HCL 10 MG PO TABS: 10.0000 mg | ORAL_TABLET | Freq: Two times a day (BID) | ORAL | Status: DC

## 2013-12-23 NOTE — Patient Instructions (Signed)
Finish out starter pack for [employer] and then get prescription filled for Namenda 10 mg twice daily.

## 2013-12-23 NOTE — Progress Notes (Signed)
   Subjective:    Patient ID: Marvin Wagner, male    DOB: 1930/08/29, 80 y.o.   MRN: [id number]  HPI Patient seen for follow-up regarding cognitive impairment. Recent MMSE 26 out of 30 (27/30 several months prior). He had prior MRI scan as well as TSH and B12 were normal. We started Namenda starter pack but unfortunately was recently process of moving to assisted living facility and he has not taken his medicines consistently. Started back a few days ago. He denies any specific side effects. He did recently acquire pillbox to help with taking medications  Past Medical History  Diagnosis Date  . Aortic stenosis     SEVERE  . Heart murmur   . Hypertension   . Hyperlipidemia   . Aortic insufficiency and aortic stenosis   . Mild memory disturbance   . CAD (coronary artery disease)    Past Surgical History  Procedure Laterality Date  . Cardiac catheterization    . Coronary angioplasty      NORMAL LEFT VENTRICULAR SIZEAND CONTRACTILITY WITH NORMAL SYSTOLIC FUNCTION. EF 65%  . Aortic valve replacement  12/17/08  . Tonsillectomy      reports that he quit smoking about 7 years ago. He does not have any smokeless tobacco history on file. He reports that he does not drink alcohol or use illicit drugs. family history includes Ankylosing spondylitis in his brother. Allergies  Allergen Reactions  . Penicillins       Review of Systems  Constitutional: Negative for fever and chills.  Respiratory: Negative for cough and shortness of breath.        Objective:   Physical Exam  Constitutional: He appears well-developed and well-nourished. No distress.  Neck: Neck supple.  Cardiovascular: Normal rate and regular rhythm.   Pulmonary/Chest: Effort normal and breath sounds normal. No respiratory distress. He has no wheezes. He has no rales.          Assessment & Plan:  Cognitive impairment. Suspect early Alzheimer's dementia. Patient has not been taking Namenda regularly and will get  started back on his starter pack and then transition to Namenda 10 mg twice daily. We recommended a six-month follow-up and repeat MMSE at that point

## 2013-12-23 NOTE — Patient Instructions (Signed)
Take 10 mg today and tomorrow.  Then continue taking 1 tablet every day except 1.5 tablets on Wednesdays. Recheck in 10 days.   Anticoagulation Dose Instructions as of 12/23/2013      Marvin Wagner Tue Wed Thu Fri Sat   New Dose 5 mg 5 mg 5 mg 7.5 mg 5 mg 5 mg 5 mg    Description        Take 10 mg today and tomorrow.  Then continue taking 1 tablet every day except 1.5 tablets on Wednesdays. Recheck in 10 days.

## 2013-12-23 NOTE — Progress Notes (Signed)
Pre visit review using our clinic review tool, if applicable. No additional management support is needed unless otherwise documented below in the visit note. 

## 2013-12-25 ENCOUNTER — Ambulatory Visit: Payer: Medicare Other | Admitting: Family

## 2014-01-02 ENCOUNTER — Ambulatory Visit (INDEPENDENT_AMBULATORY_CARE_PROVIDER_SITE_OTHER): Payer: Medicare Other | Admitting: Family

## 2014-01-02 DIAGNOSIS — Z5181 Encounter for therapeutic drug level monitoring: Secondary | ICD-10-CM

## 2014-01-02 DIAGNOSIS — Z954 Presence of other heart-valve replacement: Secondary | ICD-10-CM | POA: Diagnosis not present

## 2014-01-02 DIAGNOSIS — I4892 Unspecified atrial flutter: Secondary | ICD-10-CM

## 2014-01-02 DIAGNOSIS — Z952 Presence of prosthetic heart valve: Secondary | ICD-10-CM

## 2014-01-02 LAB — POCT INR: INR: 1.1

## 2014-01-02 MED ORDER — WARFARIN SODIUM 5 MG PO TABS: ORAL_TABLET | ORAL | Status: DC

## 2014-01-02 NOTE — Patient Instructions (Signed)
Take 10 mg today and tomorrow.  Then continue taking 1 tablet every day except 1.5 tablets on Wednesdays and Fridays. Recheck in 2 week .   Anticoagulation Dose Instructions as of 01/02/2014      Dorene Grebe Tue Wed Thu Fri Sat   New Dose 5 mg 5 mg 5 mg 7.5 mg 5 mg 7.5 mg 5 mg    Description        Take 10 mg today and tomorrow.  Then continue taking 1 tablet every day except 1.5 tablets on Wednesdays and Fridays. Recheck in 2 week .

## 2014-01-16 ENCOUNTER — Ambulatory Visit (INDEPENDENT_AMBULATORY_CARE_PROVIDER_SITE_OTHER): Payer: Medicare Other | Admitting: Family

## 2014-01-16 DIAGNOSIS — Z5181 Encounter for therapeutic drug level monitoring: Secondary | ICD-10-CM

## 2014-01-16 DIAGNOSIS — I4892 Unspecified atrial flutter: Secondary | ICD-10-CM

## 2014-01-16 DIAGNOSIS — Z952 Presence of prosthetic heart valve: Secondary | ICD-10-CM

## 2014-01-16 DIAGNOSIS — Z954 Presence of other heart-valve replacement: Secondary | ICD-10-CM

## 2014-01-16 LAB — POCT INR: INR: 1.2

## 2014-01-16 NOTE — Patient Instructions (Signed)
Anticoagulation Dose Instructions as of 01/16/2014      Dorene Grebe Tue Wed Thu Fri Sat   New Dose 5 mg 5 mg 5 mg 7.5 mg 5 mg 7.5 mg 5 mg    Description        Take 10 mg today and tomorrow.  Then continue taking 1 tablet every day except 1.5 tablets on Wednesdays and Fridays. Recheck in 10 days

## 2014-01-27 ENCOUNTER — Ambulatory Visit (INDEPENDENT_AMBULATORY_CARE_PROVIDER_SITE_OTHER): Payer: Medicare Other | Admitting: Family

## 2014-01-27 DIAGNOSIS — Z5181 Encounter for therapeutic drug level monitoring: Secondary | ICD-10-CM

## 2014-01-27 DIAGNOSIS — Z954 Presence of other heart-valve replacement: Secondary | ICD-10-CM

## 2014-01-27 DIAGNOSIS — Z952 Presence of prosthetic heart valve: Secondary | ICD-10-CM

## 2014-01-27 LAB — POCT INR: INR: 2.1

## 2014-01-27 NOTE — Patient Instructions (Signed)
Continue taking 1 tablet every day except 1.5 tablets on Wednesdays and Fridays. Recheck in 3 weeks   Anticoagulation Dose Instructions as of 01/27/2014      Dorene Grebe Tue Wed Thu Fri Sat   New Dose 5 mg 5 mg 5 mg 7.5 mg 5 mg 7.5 mg 5 mg    Description        Continue taking 1 tablet every day except 1.5 tablets on Wednesdays and Fridays. Recheck in 3 weeks

## 2014-02-17 ENCOUNTER — Ambulatory Visit: Payer: Medicare Other | Admitting: Family

## 2014-02-20 ENCOUNTER — Ambulatory Visit (INDEPENDENT_AMBULATORY_CARE_PROVIDER_SITE_OTHER): Payer: Medicare Other | Admitting: Family

## 2014-02-20 DIAGNOSIS — I4892 Unspecified atrial flutter: Secondary | ICD-10-CM

## 2014-02-20 DIAGNOSIS — Z954 Presence of other heart-valve replacement: Secondary | ICD-10-CM

## 2014-02-20 DIAGNOSIS — Z952 Presence of prosthetic heart valve: Secondary | ICD-10-CM

## 2014-02-20 DIAGNOSIS — Z5181 Encounter for therapeutic drug level monitoring: Secondary | ICD-10-CM | POA: Diagnosis not present

## 2014-02-20 LAB — POCT INR: INR: 1.6

## 2014-02-20 MED ORDER — WARFARIN SODIUM 5 MG PO TABS: ORAL_TABLET | ORAL | Status: DC

## 2014-02-20 NOTE — Patient Instructions (Signed)
Today take 2 pills. Continue taking 1 tablet every day except 1.5 tablets on Wednesdays and Fridays. Recheck in 3 weeks   Anticoagulation Dose Instructions as of 02/20/2014      Dorene Grebe Tue Wed Thu Fri Sat   New Dose 5 mg 5 mg 5 mg 7.5 mg 5 mg 7.5 mg 5 mg    Description        Today take 2 pills. Continue taking 1 tablet every day except 1.5 tablets on Wednesdays and Fridays. Recheck in 3 weeks

## 2014-02-26 ENCOUNTER — Telehealth: Payer: Self-pay | Admitting: Family Medicine

## 2014-02-26 NOTE — Telephone Encounter (Signed)
Pt request refill of the following: warfarin (COUMADIN) 5 MG tablet  Pt said he does have enough med to last him till his next visit 03/13/14   Phamacy: [facility]

## 2014-02-26 NOTE — Telephone Encounter (Signed)
Wafarin was just filled on 02/20/14 with 2 refills

## 2014-02-26 NOTE — Telephone Encounter (Signed)
Left message that pt has refills left

## 2014-03-08 ENCOUNTER — Other Ambulatory Visit: Payer: Self-pay | Admitting: Cardiology

## 2014-03-13 ENCOUNTER — Other Ambulatory Visit: Payer: Self-pay | Admitting: General Practice

## 2014-03-13 ENCOUNTER — Ambulatory Visit: Payer: Medicare Other

## 2014-03-13 ENCOUNTER — Ambulatory Visit (INDEPENDENT_AMBULATORY_CARE_PROVIDER_SITE_OTHER): Payer: Medicare Other | Admitting: General Practice

## 2014-03-13 DIAGNOSIS — Z5181 Encounter for therapeutic drug level monitoring: Secondary | ICD-10-CM | POA: Diagnosis not present

## 2014-03-13 DIAGNOSIS — Z954 Presence of other heart-valve replacement: Secondary | ICD-10-CM | POA: Diagnosis not present

## 2014-03-13 DIAGNOSIS — Z952 Presence of prosthetic heart valve: Secondary | ICD-10-CM

## 2014-03-13 LAB — POCT INR: INR: 1.8

## 2014-03-13 MED ORDER — WARFARIN SODIUM 5 MG PO TABS: ORAL_TABLET | ORAL | Status: DC

## 2014-03-13 NOTE — Progress Notes (Signed)
Pre visit review using our clinic review tool, if applicable. No additional management support is needed unless otherwise documented below in the visit note. 

## 2014-03-31 ENCOUNTER — Ambulatory Visit: Payer: Medicare Other | Admitting: Cardiology

## 2014-04-03 ENCOUNTER — Ambulatory Visit: Payer: Medicare Other | Admitting: Family Medicine

## 2014-04-03 ENCOUNTER — Ambulatory Visit: Payer: Medicare Other

## 2014-04-03 ENCOUNTER — Encounter: Payer: Self-pay | Admitting: Cardiology

## 2014-05-01 ENCOUNTER — Ambulatory Visit (INDEPENDENT_AMBULATORY_CARE_PROVIDER_SITE_OTHER): Payer: Medicare Other | Admitting: General Practice

## 2014-05-01 DIAGNOSIS — Z5181 Encounter for therapeutic drug level monitoring: Secondary | ICD-10-CM

## 2014-05-01 DIAGNOSIS — Z952 Presence of prosthetic heart valve: Secondary | ICD-10-CM

## 2014-05-01 DIAGNOSIS — Z954 Presence of other heart-valve replacement: Secondary | ICD-10-CM | POA: Diagnosis not present

## 2014-05-01 LAB — POCT INR: INR: 1.4

## 2014-05-01 NOTE — Progress Notes (Signed)
Pre visit review using our clinic review tool, if applicable. No additional management support is needed unless otherwise documented below in the visit note. 

## 2014-05-15 ENCOUNTER — Ambulatory Visit: Payer: Medicare Other

## 2014-05-20 ENCOUNTER — Other Ambulatory Visit: Payer: Self-pay | Admitting: Cardiology

## 2014-05-21 ENCOUNTER — Other Ambulatory Visit: Payer: Self-pay | Admitting: Dermatology

## 2014-05-21 DIAGNOSIS — L570 Actinic keratosis: Secondary | ICD-10-CM | POA: Diagnosis not present

## 2014-05-21 DIAGNOSIS — D485 Neoplasm of uncertain behavior of skin: Secondary | ICD-10-CM | POA: Diagnosis not present

## 2014-05-21 DIAGNOSIS — D044 Carcinoma in situ of skin of scalp and neck: Secondary | ICD-10-CM | POA: Diagnosis not present

## 2014-05-21 DIAGNOSIS — D0460 Carcinoma in situ of skin of unspecified upper limb, including shoulder: Secondary | ICD-10-CM | POA: Diagnosis not present

## 2014-05-21 DIAGNOSIS — D0462 Carcinoma in situ of skin of left upper limb, including shoulder: Secondary | ICD-10-CM | POA: Diagnosis not present

## 2014-06-20 ENCOUNTER — Encounter: Payer: Self-pay | Admitting: Cardiology

## 2014-06-20 ENCOUNTER — Ambulatory Visit (INDEPENDENT_AMBULATORY_CARE_PROVIDER_SITE_OTHER): Payer: Medicare Other | Admitting: Cardiology

## 2014-06-20 VITALS — BP 128/64 | HR 69 | Ht 70.5 in | Wt 149.8 lb

## 2014-06-20 DIAGNOSIS — R002 Palpitations: Secondary | ICD-10-CM | POA: Diagnosis not present

## 2014-06-20 NOTE — Progress Notes (Signed)
Cardiology Office Note   Date:  06/20/2014   ID:  Marvin Wagner, DOB [birth year], MRN [id number]  PCP:  Eulas Post, MD  Cardiologist: Darlin Coco MD  No chief complaint on file.     History of Present Illness: Marvin Wagner is a 81 y.o. male who presents for a six-month follow-up office visit  This pleasant 81 year old gentleman is seen for a scheduled followup office visit. He has a past history of severe aortic stenosis. On 12/17/08 he underwent aortic valve replacement with a pericardial tissue valve by Dr. Roxy Manns. He also underwent single-vessel CABG at the time of his aortic valve replacement. The patient has been doing well. He exercises on his own. He rides an exercise bike 6 miles 3 times a week. Marland Kitchen Has been feeling well with no new cardiac symptoms. He does a lot of deep knee bends and has strengthened his legs. Since last visit he has had no new cardiac symptoms. Specifically he denies chest pain or shortness of breath. He has not had any TIA symptoms. He has not been aware of any palpitations or irregular heartbeat. He does not have any past history of atrial flutter fibrillation. In November 2014 the patient was found to be in asymptomatic atrial flutter with controlled ventricular response and has been on warfarin since that time. Outpatient cardioversion was planned. However when the patient returned on 01/23/13 to discuss cardioversion it was noted that he had gone back into normal rhythm on his own. He has continued on warfarin. He himself is not aware of when he goes in and out of atrial flutter. Since we last saw him his wife has had several falls.  They have moved to a retirement home on [address]. Since last visit the patient has not had any recurrence of his atrial fibrillation.  He remains on warfarin.  His warfarin is checked at Dr. Erick Blinks office.  Past Medical History  Diagnosis Date  . Aortic stenosis     SEVERE  . Heart murmur   .  Hypertension   . Hyperlipidemia   . Aortic insufficiency and aortic stenosis   . Mild memory disturbance   . CAD (coronary artery disease)     Past Surgical History  Procedure Laterality Date  . Cardiac catheterization    . Coronary angioplasty      NORMAL LEFT VENTRICULAR SIZEAND CONTRACTILITY WITH NORMAL SYSTOLIC FUNCTION. EF 65%  . Aortic valve replacement  12/17/08  . Tonsillectomy       Current Outpatient Prescriptions  Medication Sig Dispense Refill  . acetaminophen (TYLENOL) 500 MG tablet Take 500 mg by mouth every 6 (six) hours as needed (for pain).     Marland Kitchen atorvastatin (LIPITOR) 10 MG tablet Take 10 mg by mouth daily.    . calcium carbonate (OS-CAL) 600 MG TABS Take 600 mg by mouth 2 (two) times daily with a meal.      . Cholecalciferol (VITAMIN D) 2000 UNITS tablet Take 2,000 Units by mouth daily.      . memantine (NAMENDA TITRATION PAK) tablet pack 5 mg/day for =1 week; 5 mg twice daily for =1 week; 15 mg/day given in 5 mg and 10 mg separated doses for =1 week; then 10 mg twice daily 49 tablet 12  . metoprolol tartrate (LOPRESSOR) 25 MG tablet Take 0.5 tablets (12.5 mg total) by mouth daily. 45 tablet 0  . Multiple Vitamin (MULTIVITAMIN) tablet Take 1 tablet by mouth daily.      Marland Kitchen  vitamin C (ASCORBIC ACID) 500 MG tablet Take 500 mg by mouth daily.      Marland Kitchen warfarin (COUMADIN) 5 MG tablet Take as directed by anticoagulation clinic 120 tablet 0   No current facility-administered medications for this visit.    Allergies:   Penicillins    Social History:  The patient  reports that he quit smoking about 7 years ago. He does not have any smokeless tobacco history on file. He reports that he does not drink alcohol or use illicit drugs.   Family History:  The patient's family history includes Ankylosing spondylitis in his brother.    ROS:  Please see the history of present illness.   Otherwise, review of systems are positive for none.   All other systems are reviewed and  negative.    PHYSICAL EXAM: VS:  BP 128/64 mmHg  Pulse 69  Ht 5' 10.5" (1.791 m)  Wt 149 lb 12.8 oz (67.949 kg)  BMI 21.18 kg/m2 , BMI Body mass index is 21.18 kg/(m^2). GEN: Well nourished, well developed, in no acute distress HEENT: normal Neck: no JVD, carotid bruits, or masses Cardiac: RRR; no murmurs, rubs, or gallops,no edema  Respiratory:  clear to auscultation bilaterally, normal work of breathing GI: soft, nontender, nondistended, + BS MS: no deformity or atrophy Skin: warm and dry, no rash Neuro:  Strength and sensation are intact Psych: euthymic mood, full affect   EKG:  EKG is ordered today. The ekg ordered today demonstrates normal sinus rhythm with frequent PACs and PVCs.  Left axis deviation.  No ischemic changes.   Recent Labs: 10/17/2013: ALT 15; BUN 21; Creatinine 0.9; Hemoglobin 12.6*; Platelets 216.0; Potassium 4.3; Sodium 140; TSH 0.38    Lipid Panel    Component Value Date/Time   CHOL 111 10/17/2013 1202   TRIG 75.0 10/17/2013 1202   HDL 43.10 10/17/2013 1202   CHOLHDL 3 10/17/2013 1202   VLDL 15.0 10/17/2013 1202   LDLCALC 53 10/17/2013 1202      Wt Readings from Last 3 Encounters:  06/20/14 149 lb 12.8 oz (67.949 kg)  12/23/13 149 lb (67.586 kg)  11/22/13 146 lb (66.225 kg)      ASSESSMENT AND PLAN:  1. Paroxysmal atrial flutter. On long-term Coumadin. Chadssvasc score of 3-4 2. hypertensive cardiovascular disease without heart failure. 3. history of CABG on 12/17/08. No recurrent chest pain 4. history of aortic valve replacement with pericardial tissue valve on 12/17/08 5. unintentional weight loss, which has stabilized.  No change in weight since November 2015 6. memory disorder  Disposition: Continue current medication. Recheck in 6 months for office visit and EKG.   Current medicines are reviewed at length with the patient today.  The patient does not have concerns regarding medicines.  The following changes have been made:   no change  Labs/ tests ordered today include:   Orders Placed This Encounter  Procedures  . EKG 12-Lead    Orlan Leavens MD 06/20/2014 5:23 PM    [facility] [place], [place], [place]  [address] Phone: [phone]; Fax: [phone]

## 2014-06-20 NOTE — Patient Instructions (Signed)
Medication Instructions:  Your physician recommends that you continue on your current medications as directed. Please refer to the Current Medication list given to you today.  Labwork: NONE  Testing/Procedures: NONE  Follow-Up: Your physician wants you to follow-up in: 6 MONTH OV/EKG  You will receive a reminder letter in the mail two months in advance. If you don't receive a letter, please call our office to schedule the follow-up appointment.    

## 2014-06-23 ENCOUNTER — Ambulatory Visit (INDEPENDENT_AMBULATORY_CARE_PROVIDER_SITE_OTHER): Payer: Medicare Other | Admitting: Family Medicine

## 2014-06-23 ENCOUNTER — Encounter: Payer: Self-pay | Admitting: Family Medicine

## 2014-06-23 VITALS — BP 134/70 | HR 60 | Temp 97.6°F | Wt 148.0 lb

## 2014-06-23 DIAGNOSIS — Z7901 Long term (current) use of anticoagulants: Secondary | ICD-10-CM

## 2014-06-23 DIAGNOSIS — R413 Other amnesia: Secondary | ICD-10-CM | POA: Diagnosis not present

## 2014-06-23 LAB — POCT INR: INR: 2.1

## 2014-06-23 MED ORDER — DONEPEZIL HCL 5 MG PO TABS: 5.0000 mg | ORAL_TABLET | Freq: Every day | ORAL | Status: DC

## 2014-06-23 NOTE — Progress Notes (Signed)
Pre visit review using our clinic review tool, if applicable. No additional management support is needed unless otherwise documented below in the visit note. 

## 2014-06-23 NOTE — Progress Notes (Signed)
   Subjective:    Patient ID: Marvin Wagner, male    DOB: 07/29/1930, 81 y.o.   MRN: [id number]  HPI Patient has history of CAD, hyperlipidemia, atrial flutter on chronic Coumadin, and memory impairment. He is on Coumadin and no INR since and of March. No recent bleeding complications. Saw cardiologist recently. No change in medications. Denies any recent chest pains  Patient had 26 out of 30 Mini-Mental Status exam score last fall. We started Namenda but he does not have any good recollection and apparently is not taking this currently. He cannot recall whether this helped or not. He is currently living with his wife and assisted living complex. He has no specific complaints today. Previous MRI brain, B12, and TSH normal  Past Medical History  Diagnosis Date  . Aortic stenosis     SEVERE  . Heart murmur   . Hypertension   . Hyperlipidemia   . Aortic insufficiency and aortic stenosis   . Mild memory disturbance   . CAD (coronary artery disease)    Past Surgical History  Procedure Laterality Date  . Cardiac catheterization    . Coronary angioplasty      NORMAL LEFT VENTRICULAR SIZEAND CONTRACTILITY WITH NORMAL SYSTOLIC FUNCTION. EF 65%  . Aortic valve replacement  12/17/08  . Tonsillectomy      reports that he quit smoking about 7 years ago. He does not have any smokeless tobacco history on file. He reports that he does not drink alcohol or use illicit drugs. family history includes Ankylosing spondylitis in his brother. Allergies  Allergen Reactions  . Penicillins       Review of Systems  Constitutional: Negative for fatigue.  Eyes: Negative for visual disturbance.  Respiratory: Negative for cough, chest tightness and shortness of breath.   Cardiovascular: Negative for chest pain, palpitations and leg swelling.  Neurological: Negative for dizziness, syncope, weakness, light-headedness and headaches.       Objective:   Physical Exam  Constitutional: He appears  well-developed and well-nourished.  Neck: Neck supple. No thyromegaly present.  Cardiovascular: Normal rate.   Irregular rhythm, rate controlled  Pulmonary/Chest: Effort normal and breath sounds normal. No respiratory distress. He has no wheezes. He has no rales.  Musculoskeletal: He exhibits no edema.  Lymphadenopathy:    He has no cervical adenopathy.  Neurological: He is alert.          Assessment & Plan:  #1 cognitive impairment. Probable dementia. Repeat MMSE. Consider trial of Aricept- MMSE repeat 23 out of 30. Start Aricept 5 mg once daily. Reassess here 1 month. Titrate to 10 mg at that point if no side effects #2 history of atrial flutter on chronic Coumadin. Recheck INR

## 2014-06-30 ENCOUNTER — Other Ambulatory Visit: Payer: Self-pay | Admitting: Family Medicine

## 2014-07-01 ENCOUNTER — Other Ambulatory Visit: Payer: Self-pay | Admitting: General Practice

## 2014-07-01 MED ORDER — WARFARIN SODIUM 5 MG PO TABS: ORAL_TABLET | ORAL | Status: DC

## 2014-07-15 ENCOUNTER — Encounter: Payer: Self-pay | Admitting: Family Medicine

## 2014-07-15 ENCOUNTER — Ambulatory Visit (INDEPENDENT_AMBULATORY_CARE_PROVIDER_SITE_OTHER): Payer: Medicare Other | Admitting: Family Medicine

## 2014-07-15 VITALS — BP 120/74 | HR 84 | Temp 97.7°F | Wt 144.0 lb

## 2014-07-15 DIAGNOSIS — J029 Acute pharyngitis, unspecified: Secondary | ICD-10-CM | POA: Diagnosis not present

## 2014-07-15 MED ORDER — DONEPEZIL HCL 10 MG PO TABS: 10.0000 mg | ORAL_TABLET | Freq: Every day | ORAL | Status: DC

## 2014-07-15 NOTE — Patient Instructions (Signed)
Try over the counter Mucinex twice daily and stay well hydrated. Follow up for any fever or increased shortness of breath.

## 2014-07-15 NOTE — Progress Notes (Signed)
   Subjective:    Patient ID: Marvin Wagner, male    DOB: Feb 26, 1930, 81 y.o.   MRN: [id number]  HPI Patient seen with about 3 day history of some pressure behind his right ear, mild sore throat, postnasal drainage, and occasional cough. No fever. Sore throat symptoms are somewhat intermittent. No dysphagia.  He has dementia and we recently started Aricept 5 mg daily. Tolerating without side effects.  Past Medical History  Diagnosis Date  . Aortic stenosis     SEVERE  . Heart murmur   . Hypertension   . Hyperlipidemia   . Aortic insufficiency and aortic stenosis   . Mild memory disturbance   . CAD (coronary artery disease)    Past Surgical History  Procedure Laterality Date  . Cardiac catheterization    . Coronary angioplasty      NORMAL LEFT VENTRICULAR SIZEAND CONTRACTILITY WITH NORMAL SYSTOLIC FUNCTION. EF 65%  . Aortic valve replacement  12/17/08  . Tonsillectomy      reports that he quit smoking about 8 years ago. He does not have any smokeless tobacco history on file. He reports that he does not drink alcohol or use illicit drugs. family history includes Ankylosing spondylitis in his brother. Allergies  Allergen Reactions  . Penicillins       Review of Systems  Constitutional: Negative for fever.  HENT: Positive for congestion and sore throat. Negative for trouble swallowing.   Respiratory: Positive for cough.   Neurological: Negative for weakness.       Objective:   Physical Exam  Constitutional: He appears well-developed and well-nourished.  HENT:  Right Ear: External ear normal.  Left Ear: External ear normal.  Mouth/Throat: Oropharynx is clear and moist.  Neck: Neck supple.  Cardiovascular: Normal rate and regular rhythm.   Pulmonary/Chest: Effort normal and breath sounds normal. No respiratory distress. He has no wheezes. He has no rales.  Lymphadenopathy:    He has no cervical adenopathy.          Assessment & Plan:  #1 sore throat. Probably  postnasal drip related. Oropharynx appears normal. Try over-the-counter Mucinex and stay well-hydrated. Observe for now. Follow-up for fever or shortness of breath. #2-dementia.   In 2 weeks titrate Aricept to 10 mg daily. Prescription written. Reassess 2 months

## 2014-07-15 NOTE — Progress Notes (Signed)
Pre visit review using our clinic review tool, if applicable. No additional management support is needed unless otherwise documented below in the visit note. 

## 2014-07-16 ENCOUNTER — Other Ambulatory Visit: Payer: Self-pay

## 2014-07-16 MED ORDER — ATORVASTATIN CALCIUM 10 MG PO TABS: 10.0000 mg | ORAL_TABLET | Freq: Every day | ORAL | Status: DC

## 2014-07-16 MED ORDER — WARFARIN SODIUM 5 MG PO TABS: ORAL_TABLET | ORAL | Status: DC

## 2014-07-16 MED ORDER — METOPROLOL TARTRATE 25 MG PO TABS: 12.5000 mg | ORAL_TABLET | Freq: Every day | ORAL | Status: DC

## 2014-07-16 MED ORDER — DONEPEZIL HCL 10 MG PO TABS: 10.0000 mg | ORAL_TABLET | Freq: Every day | ORAL | Status: DC

## 2014-07-17 DIAGNOSIS — L570 Actinic keratosis: Secondary | ICD-10-CM | POA: Diagnosis not present

## 2014-07-17 DIAGNOSIS — D044 Carcinoma in situ of skin of scalp and neck: Secondary | ICD-10-CM | POA: Diagnosis not present

## 2014-07-17 DIAGNOSIS — D0460 Carcinoma in situ of skin of unspecified upper limb, including shoulder: Secondary | ICD-10-CM | POA: Diagnosis not present

## 2014-07-24 ENCOUNTER — Ambulatory Visit (INDEPENDENT_AMBULATORY_CARE_PROVIDER_SITE_OTHER): Payer: Medicare Other | Admitting: General Practice

## 2014-07-24 ENCOUNTER — Ambulatory Visit: Payer: Medicare Other | Admitting: Family Medicine

## 2014-07-24 DIAGNOSIS — Z5181 Encounter for therapeutic drug level monitoring: Secondary | ICD-10-CM | POA: Diagnosis not present

## 2014-07-24 DIAGNOSIS — I4892 Unspecified atrial flutter: Secondary | ICD-10-CM

## 2014-07-24 DIAGNOSIS — Z952 Presence of prosthetic heart valve: Secondary | ICD-10-CM

## 2014-07-24 LAB — POCT INR: INR: 4.5

## 2014-07-24 NOTE — Progress Notes (Signed)
Pre visit review using our clinic review tool, if applicable. No additional management support is needed unless otherwise documented below in the visit note. 

## 2014-07-25 ENCOUNTER — Other Ambulatory Visit: Payer: Self-pay | Admitting: Cardiology

## 2014-07-25 ENCOUNTER — Other Ambulatory Visit: Payer: Self-pay | Admitting: Family Medicine

## 2014-07-28 ENCOUNTER — Other Ambulatory Visit: Payer: Self-pay | Admitting: General Practice

## 2014-07-28 ENCOUNTER — Other Ambulatory Visit: Payer: Self-pay

## 2014-07-28 MED ORDER — WARFARIN SODIUM 5 MG PO TABS: ORAL_TABLET | ORAL | Status: DC

## 2014-08-07 ENCOUNTER — Ambulatory Visit (INDEPENDENT_AMBULATORY_CARE_PROVIDER_SITE_OTHER): Payer: Medicare Other | Admitting: General Practice

## 2014-08-07 DIAGNOSIS — Z952 Presence of prosthetic heart valve: Secondary | ICD-10-CM

## 2014-08-07 DIAGNOSIS — Z5181 Encounter for therapeutic drug level monitoring: Secondary | ICD-10-CM

## 2014-08-07 DIAGNOSIS — Z954 Presence of other heart-valve replacement: Secondary | ICD-10-CM | POA: Diagnosis not present

## 2014-08-07 LAB — POCT INR: INR: 1.2

## 2014-08-07 NOTE — Progress Notes (Signed)
Pre visit review using our clinic review tool, if applicable. No additional management support is needed unless otherwise documented below in the visit note. 

## 2014-08-11 ENCOUNTER — Ambulatory Visit: Payer: Medicare Other

## 2014-08-17 ENCOUNTER — Other Ambulatory Visit: Payer: Self-pay | Admitting: Cardiology

## 2014-08-18 ENCOUNTER — Ambulatory Visit (INDEPENDENT_AMBULATORY_CARE_PROVIDER_SITE_OTHER): Payer: Medicare Other | Admitting: General Practice

## 2014-08-18 DIAGNOSIS — I4892 Unspecified atrial flutter: Secondary | ICD-10-CM | POA: Diagnosis not present

## 2014-08-18 DIAGNOSIS — Z5181 Encounter for therapeutic drug level monitoring: Secondary | ICD-10-CM

## 2014-08-18 DIAGNOSIS — Z954 Presence of other heart-valve replacement: Secondary | ICD-10-CM | POA: Diagnosis not present

## 2014-08-18 DIAGNOSIS — Z952 Presence of prosthetic heart valve: Secondary | ICD-10-CM

## 2014-08-18 DIAGNOSIS — L570 Actinic keratosis: Secondary | ICD-10-CM | POA: Diagnosis not present

## 2014-08-18 LAB — POCT INR: INR: 1.6

## 2014-08-18 NOTE — Progress Notes (Signed)
Pre visit review using our clinic review tool, if applicable. No additional management support is needed unless otherwise documented below in the visit note. 

## 2014-09-08 ENCOUNTER — Ambulatory Visit (INDEPENDENT_AMBULATORY_CARE_PROVIDER_SITE_OTHER): Payer: Medicare Other | Admitting: General Practice

## 2014-09-08 ENCOUNTER — Other Ambulatory Visit: Payer: Self-pay | Admitting: Family Medicine

## 2014-09-08 DIAGNOSIS — I4892 Unspecified atrial flutter: Secondary | ICD-10-CM | POA: Diagnosis not present

## 2014-09-08 DIAGNOSIS — Z5181 Encounter for therapeutic drug level monitoring: Secondary | ICD-10-CM | POA: Diagnosis not present

## 2014-09-08 DIAGNOSIS — Z952 Presence of prosthetic heart valve: Secondary | ICD-10-CM

## 2014-09-08 DIAGNOSIS — Z954 Presence of other heart-valve replacement: Secondary | ICD-10-CM

## 2014-09-08 LAB — POCT INR: INR: 2

## 2014-09-08 NOTE — Progress Notes (Signed)
Pre visit review using our clinic review tool, if applicable. No additional management support is needed unless otherwise documented below in the visit note. 

## 2014-09-08 NOTE — Progress Notes (Signed)
Agree with recommendations.  

## 2014-09-15 ENCOUNTER — Telehealth: Payer: Self-pay | Admitting: Family Medicine

## 2014-09-15 MED ORDER — DONEPEZIL HCL 10 MG PO TABS: 10.0000 mg | ORAL_TABLET | Freq: Every day | ORAL | Status: DC

## 2014-09-15 NOTE — Telephone Encounter (Signed)
Rx sent to pharmacy   

## 2014-09-15 NOTE — Telephone Encounter (Signed)
Mr. Reasoner said he is out of Aricept.  He wants to know if Dr. Elease Hashimoto wants him to keep taking it, and if so, he needs it called in.   Pharmacy- [facility] in [place]

## 2014-09-23 ENCOUNTER — Encounter: Payer: Self-pay | Admitting: Family Medicine

## 2014-09-23 ENCOUNTER — Ambulatory Visit (INDEPENDENT_AMBULATORY_CARE_PROVIDER_SITE_OTHER): Payer: Medicare Other | Admitting: Family Medicine

## 2014-09-23 VITALS — BP 128/70 | HR 66 | Temp 97.6°F | Wt 141.0 lb

## 2014-09-23 DIAGNOSIS — R413 Other amnesia: Secondary | ICD-10-CM | POA: Diagnosis not present

## 2014-09-23 MED ORDER — MEMANTINE HCL 28 X 5 MG & 21 X 10 MG PO TABS: ORAL_TABLET | ORAL | Status: DC

## 2014-09-23 NOTE — Progress Notes (Signed)
   Subjective:    Patient ID: Marvin Wagner, male    DOB: Jun 24, 1930, 81 y.o.   MRN: [id number]  HPI Patient seen for follow-up regarding cognitive impairment. He has history of dementia and is currently on Aricept 10 mg daily. This was titrated up in May. He has not seen much improvement. Still struggles with short-term memory. He denies any side effects from medication. Previous B12 and thyroid function testing normal. He remains on Coumadin for atrial fibrillation and history of aortic valve replacement. Overall doing well. No chest pains. No shortness of breath. He and his wife are currently living in assisted living facility He is still driving and not having difficulty with short distances in familiar places  Past Medical History  Diagnosis Date  . Aortic stenosis     SEVERE  . Heart murmur   . Hypertension   . Hyperlipidemia   . Aortic insufficiency and aortic stenosis   . Mild memory disturbance   . CAD (coronary artery disease)    Past Surgical History  Procedure Laterality Date  . Cardiac catheterization    . Coronary angioplasty      NORMAL LEFT VENTRICULAR SIZEAND CONTRACTILITY WITH NORMAL SYSTOLIC FUNCTION. EF 65%  . Aortic valve replacement  12/17/08  . Tonsillectomy      reports that he quit smoking about 8 years ago. He does not have any smokeless tobacco history on file. He reports that he does not drink alcohol or use illicit drugs. family history includes Ankylosing spondylitis in his brother. Allergies  Allergen Reactions  . Penicillins       Review of Systems  Constitutional: Negative for appetite change, fatigue and unexpected weight change.  Eyes: Negative for visual disturbance.  Respiratory: Negative for cough, chest tightness and shortness of breath.   Cardiovascular: Negative for chest pain, palpitations and leg swelling.  Neurological: Negative for dizziness, syncope, weakness, light-headedness and headaches.       Objective:   Physical Exam    Constitutional: He is oriented to person, place, and time. He appears well-developed and well-nourished.  Cardiovascular:  Irregular heart rhythm rate controlled  Pulmonary/Chest: Effort normal and breath sounds normal. No respiratory distress. He has no wheezes. He has no rales.  Neurological: He is alert and oriented to person, place, and time. No cranial nerve deficit.          Assessment & Plan:  Cognitive impairment. Suspect Alzheimer's dementia. Add Namenda starter pack and reassess one month. Repeat MMSE at that time.

## 2014-09-23 NOTE — Progress Notes (Signed)
Pre visit review using our clinic review tool, if applicable. No additional management support is needed unless otherwise documented below in the visit note. 

## 2014-09-26 ENCOUNTER — Ambulatory Visit (INDEPENDENT_AMBULATORY_CARE_PROVIDER_SITE_OTHER): Payer: Medicare Other | Admitting: Family Medicine

## 2014-09-26 ENCOUNTER — Encounter: Payer: Self-pay | Admitting: Family Medicine

## 2014-09-26 VITALS — BP 130/68 | HR 58 | Temp 97.5°F | Wt 140.0 lb

## 2014-09-26 DIAGNOSIS — R634 Abnormal weight loss: Secondary | ICD-10-CM | POA: Diagnosis not present

## 2014-09-26 DIAGNOSIS — H1132 Conjunctival hemorrhage, left eye: Secondary | ICD-10-CM

## 2014-09-26 DIAGNOSIS — R1111 Vomiting without nausea: Secondary | ICD-10-CM | POA: Diagnosis not present

## 2014-09-26 LAB — HEPATIC FUNCTION PANEL
ALBUMIN: 4.1 g/dL (ref 3.5–5.2)
ALT: 14 U/L (ref 0–53)
AST: 18 U/L (ref 0–37)
Alkaline Phosphatase: 56 U/L (ref 39–117)
Bilirubin, Direct: 0.2 mg/dL (ref 0.0–0.3)
TOTAL PROTEIN: 6.8 g/dL (ref 6.0–8.3)
Total Bilirubin: 0.9 mg/dL (ref 0.2–1.2)

## 2014-09-26 LAB — CBC WITH DIFFERENTIAL/PLATELET
BASOS ABS: 0 10*3/uL (ref 0.0–0.1)
Basophils Relative: 0.2 % (ref 0.0–3.0)
Eosinophils Absolute: 0 10*3/uL (ref 0.0–0.7)
Eosinophils Relative: 0.3 % (ref 0.0–5.0)
HCT: 38.3 % — ABNORMAL LOW (ref 39.0–52.0)
Hemoglobin: 13 g/dL (ref 13.0–17.0)
LYMPHS ABS: 1.3 10*3/uL (ref 0.7–4.0)
Lymphocytes Relative: 15.9 % (ref 12.0–46.0)
MCHC: 33.9 g/dL (ref 30.0–36.0)
MCV: 96.2 fl (ref 78.0–100.0)
MONO ABS: 0.6 10*3/uL (ref 0.1–1.0)
MONOS PCT: 7.1 % (ref 3.0–12.0)
NEUTROS ABS: 6.3 10*3/uL (ref 1.4–7.7)
NEUTROS PCT: 76.5 % (ref 43.0–77.0)
Platelets: 138 10*3/uL — ABNORMAL LOW (ref 150.0–400.0)
RBC: 3.98 Mil/uL — AB (ref 4.22–5.81)
RDW: 13.5 % (ref 11.5–15.5)
WBC: 8.2 10*3/uL (ref 4.0–10.5)

## 2014-09-26 LAB — BASIC METABOLIC PANEL
BUN: 15 mg/dL (ref 6–23)
CHLORIDE: 100 meq/L (ref 96–112)
CO2: 35 meq/L — AB (ref 19–32)
Calcium: 9.5 mg/dL (ref 8.4–10.5)
Creatinine, Ser: 0.74 mg/dL (ref 0.40–1.50)
GFR: 107.67 mL/min (ref >60.00)
GLUCOSE: 114 mg/dL — AB (ref 70–99)
Potassium: 3.7 mEq/L (ref 3.5–5.1)
SODIUM: 140 meq/L (ref 135–145)

## 2014-09-26 LAB — LIPASE: LIPASE: 10 U/L — AB (ref 11.0–59.0)

## 2014-09-26 LAB — TSH: TSH: 1.62 u[IU]/mL (ref 0.35–4.50)

## 2014-09-26 NOTE — Progress Notes (Signed)
Subjective:    Patient ID: Marvin Wagner, male    DOB: [birth year], 81 y.o.   MRN: [id number]  HPI Patient seen as a work in with one-day history of vomiting without nausea. He has past history of coronary artery disease, hyperlipidemia, hypertension, cognitive impairment, history of aortic valve replacement, atrial flutter. Maintained on chronic Coumadin. He was seen here since a few days ago and we added Namenda to his Aricept for cognitive impairment. Yesterday morning he had one episode of vomiting without any preceding nausea without any abdominal pain whatsoever. He was then doing well until around noon today when he had second episode of vomiting again without nausea. He has been keeping down some fluids and crackers since then. He's not had any true diarrhea but has had some loose stools. No bloody stools. No abdominal distention.  He also apparently woke up this morning with left subconjunctival hemorrhage. No visual impairment. He does take Coumadin which has been therapeutic.  Past Medical History  Diagnosis Date  . Aortic stenosis     SEVERE  . Heart murmur   . Hypertension   . Hyperlipidemia   . Aortic insufficiency and aortic stenosis   . Mild memory disturbance   . CAD (coronary artery disease)    Past Surgical History  Procedure Laterality Date  . Cardiac catheterization    . Coronary angioplasty      NORMAL LEFT VENTRICULAR SIZEAND CONTRACTILITY WITH NORMAL SYSTOLIC FUNCTION. EF 65%  . Aortic valve replacement  12/17/08  . Tonsillectomy      reports that he quit smoking about 8 years ago. He does not have any smokeless tobacco history on file. He reports that he does not drink alcohol or use illicit drugs. family history includes Ankylosing spondylitis in his brother. Allergies  Allergen Reactions  . Penicillins      Review of Systems  Constitutional: Positive for appetite change.  HENT: Negative for trouble swallowing.   Respiratory: Negative for cough and  shortness of breath.   Cardiovascular: Negative for chest pain.  Gastrointestinal: Positive for vomiting. Negative for nausea, abdominal pain, constipation, blood in stool and abdominal distention.  Genitourinary: Negative for dysuria.  Neurological: Negative for headaches.  Hematological: Negative for adenopathy.  Psychiatric/Behavioral: Negative for confusion.       Objective:   Physical Exam  Constitutional: He appears well-developed and well-nourished.  HENT:  Mouth/Throat: Oropharynx is clear and moist.  Eyes:  He has prominent left subconjunctival hemorrhage. Pupils equal round reactive to light. Extraocular movements normal  Neck: Neck supple.  Cardiovascular: Normal rate.   Pulmonary/Chest: Effort normal and breath sounds normal. No respiratory distress. He has no wheezes. He has no rales.  Abdominal: Soft. Bowel sounds are normal. He exhibits no distension and no mass. There is no tenderness. There is no rebound and no guarding.  Musculoskeletal: He exhibits no edema.          Assessment & Plan:  #1 vomiting. He does not appear dehydrated. He does have some loose stool and this may be viral. Nonfocal exam. No evidence for bowel obstruction. Doubt pancreatitis. He does also have some weight loss gradually the past few months. Obtain further screening labs with hepatic panel, CBC, basic metabolic panel, TSH, lipase. Increase liquids. Advance diet slowly as tolerated. We do not suspect this is related to recent Namenda but he'll hold over the weekend until symptoms have fully resolved #2 left subconjunctival hemorrhage. Possibly related to recent vomiting. No visual changes. Reassurance and observe.  He does not have any evidence for other bleeding complications related to Coumadin

## 2014-09-26 NOTE — Patient Instructions (Signed)
Subconjunctival Hemorrhage A subconjunctival hemorrhage is a bright red patch covering a portion of the white of the eye. The white part of the eye is called the sclera, and it is covered by a thin membrane called the conjunctiva. This membrane is clear, except for tiny blood vessels that you can see with the naked eye. When your eye is irritated or inflamed and becomes red, it is because the vessels in the conjunctiva are swollen. Sometimes, a blood vessel in the conjunctiva can break and bleed. When this occurs, the blood builds up between the conjunctiva and the sclera, and spreads out to create a red area. The red spot may be very small at first. It may then spread to cover a larger part of the surface of the eye, or even all of the visible white part of the eye. In almost all cases, the blood will go away and the eye will become white again. Before completely dissolving, however, the red area may spread. It may also become brownish-yellow in color before going away. If a lot of blood collects under the conjunctiva, it may look like a bulge on the surface of the eye. This looks scary, but it will also eventually flatten out and go away. Subconjunctival hemorrhages do not cause pain, but if swollen, may cause a feeling of irritation. There is no effect on vision.  CAUSES   The most common cause is mild trauma (rubbing the eye, irritation).  Subconjunctival hemorrhages can happen because of coughing or straining (lifting heavy objects), vomiting, or sneezing.  In some cases, your doctor may want to check your blood pressure. High blood pressure can also cause a subconjunctival hemorrhage.  Severe trauma or blunt injuries.  Diseases that affect blood clotting (hemophilia, leukemia).  Abnormalities of blood vessels behind the eye (carotid cavernous sinus fistula).  Tumors behind the eye.  Certain drugs (aspirin, Coumadin, heparin).  Recent eye surgery. HOME CARE INSTRUCTIONS   Do not worry  about the appearance of your eye. You may continue your usual activities.  Often, follow-up is not necessary. SEEK MEDICAL CARE IF:   Your eye becomes painful.  The bleeding does not disappear within 3 weeks.  Bleeding occurs elsewhere, for example, under the skin, in the mouth, or in the other eye.  You have recurring subconjunctival hemorrhages. SEEK IMMEDIATE MEDICAL CARE IF:   Your vision changes or you have difficulty seeing.  You develop a severe headache, persistent vomiting, confusion, or abnormal drowsiness (lethargy).  Your eye seems to bulge or protrude from the eye socket.  You notice the sudden appearance of bruises or have spontaneous bleeding elsewhere on your body. Document Released: 01/17/2005 Document Revised: 06/03/2013 Document Reviewed: 12/15/2008 [facility] Patient Information 2015 [place], [place]. This information is not intended to replace advice given to you by your health care provider. Make sure you discuss any questions you have with your health care provider.  Continue with clear liquids and slowly advance diet as tolerated Hold Namenda until symptoms resolved -and then consider starting back next week if better.

## 2014-09-26 NOTE — Progress Notes (Signed)
Pre visit review using our clinic review tool, if applicable. No additional management support is needed unless otherwise documented below in the visit note. 

## 2014-10-03 DIAGNOSIS — Z23 Encounter for immunization: Secondary | ICD-10-CM | POA: Diagnosis not present

## 2014-10-09 ENCOUNTER — Ambulatory Visit: Payer: Medicare Other

## 2014-10-24 ENCOUNTER — Ambulatory Visit: Payer: Medicare Other | Admitting: Family Medicine

## 2014-10-27 ENCOUNTER — Other Ambulatory Visit: Payer: Self-pay | Admitting: General Practice

## 2014-10-27 ENCOUNTER — Ambulatory Visit (INDEPENDENT_AMBULATORY_CARE_PROVIDER_SITE_OTHER): Payer: Medicare Other | Admitting: General Practice

## 2014-10-27 ENCOUNTER — Encounter: Payer: Self-pay | Admitting: Family Medicine

## 2014-10-27 ENCOUNTER — Ambulatory Visit (INDEPENDENT_AMBULATORY_CARE_PROVIDER_SITE_OTHER): Payer: Medicare Other | Admitting: Family Medicine

## 2014-10-27 VITALS — BP 120/80 | HR 85 | Temp 98.1°F | Ht 70.5 in | Wt 140.5 lb

## 2014-10-27 DIAGNOSIS — R413 Other amnesia: Secondary | ICD-10-CM

## 2014-10-27 DIAGNOSIS — Z5181 Encounter for therapeutic drug level monitoring: Secondary | ICD-10-CM

## 2014-10-27 DIAGNOSIS — Z954 Presence of other heart-valve replacement: Secondary | ICD-10-CM | POA: Diagnosis not present

## 2014-10-27 DIAGNOSIS — Z952 Presence of prosthetic heart valve: Secondary | ICD-10-CM

## 2014-10-27 LAB — POCT INR: INR: 1.1

## 2014-10-27 MED ORDER — WARFARIN SODIUM 5 MG PO TABS: ORAL_TABLET | ORAL | Status: DC

## 2014-10-27 MED ORDER — MEMANTINE HCL 10 MG PO TABS
10.0000 mg | ORAL_TABLET | Freq: Two times a day (BID) | ORAL | Status: DC
Start: 2014-10-27 — End: 2015-03-11

## 2014-10-27 NOTE — Progress Notes (Signed)
Pre visit review using our clinic review tool, if applicable. No additional management support is needed unless otherwise documented below in the visit note. 

## 2014-10-27 NOTE — Progress Notes (Signed)
Agree with Coumadin management 

## 2014-10-27 NOTE — Progress Notes (Signed)
   Subjective:    Patient ID: Marvin Wagner, male    DOB: 1930-08-27, 81 y.o.   MRN: [id number]  HPI Patient seen for follow-up regarding dementia. He takes Aricept and we recently started Namenda. However, he is still using titration pack. He had developed GI bug with nausea and vomiting and stopped taking Namenda for some time. He states he is about halfway through the titration pack at this time. He has not had any obvious side effects. Appetite is stable and weight is stable. He is trying some nutritional supplements such as Boost.  Past Medical History  Diagnosis Date  . Aortic stenosis     SEVERE  . Heart murmur   . Hypertension   . Hyperlipidemia   . Aortic insufficiency and aortic stenosis   . Mild memory disturbance   . CAD (coronary artery disease)    Past Surgical History  Procedure Laterality Date  . Cardiac catheterization    . Coronary angioplasty      NORMAL LEFT VENTRICULAR SIZEAND CONTRACTILITY WITH NORMAL SYSTOLIC FUNCTION. EF 65%  . Aortic valve replacement  12/17/08  . Tonsillectomy      reports that he quit smoking about 8 years ago. He does not have any smokeless tobacco history on file. He reports that he does not drink alcohol or use illicit drugs. family history includes Ankylosing spondylitis in his brother. Allergies  Allergen Reactions  . Penicillins       Review of Systems  Constitutional: Negative for appetite change and unexpected weight change.  Respiratory: Negative for shortness of breath.   Cardiovascular: Negative for chest pain.  Neurological: Negative for dizziness and headaches.  Psychiatric/Behavioral: Negative for dysphoric mood and agitation.       Objective:   Physical Exam  Constitutional: He is oriented to person, place, and time. He appears well-developed and well-nourished.  Cardiovascular: Normal rate and regular rhythm.   Pulmonary/Chest: Effort normal and breath sounds normal. No respiratory distress. He has no wheezes.  He has no rales.  Neurological: He is alert and oriented to person, place, and time. No cranial nerve deficit.          Assessment & Plan:  Cognitive impairment with probable Alzheimer's dementia. Continue Aricept. We had planned MMSE today after getting on to [facility] but at this point he still titrating up. Finish out titration pack and start Namenda 10 mg twice daily. Reassess 2 months and plan repeat mental status evaluation then

## 2014-11-03 ENCOUNTER — Ambulatory Visit: Payer: Medicare Other

## 2014-12-03 ENCOUNTER — Ambulatory Visit (INDEPENDENT_AMBULATORY_CARE_PROVIDER_SITE_OTHER): Payer: Medicare Other | Admitting: Family Medicine

## 2014-12-03 ENCOUNTER — Encounter: Payer: Self-pay | Admitting: Family Medicine

## 2014-12-03 VITALS — BP 120/80 | HR 61 | Temp 97.2°F | Wt 141.0 lb

## 2014-12-03 DIAGNOSIS — I959 Hypotension, unspecified: Secondary | ICD-10-CM | POA: Insufficient documentation

## 2014-12-03 DIAGNOSIS — I4892 Unspecified atrial flutter: Secondary | ICD-10-CM | POA: Diagnosis not present

## 2014-12-03 NOTE — Patient Instructions (Signed)
Hold the Lopressor today and tomorrow restart on Friday by taking a half a tablet every other day  Set up a follow-up appointment with Dr. Elease Hashimoto next week for reevaluation

## 2014-12-03 NOTE — Progress Notes (Signed)
   Subjective:    Patient ID: Marvin Wagner, male    DOB: [birth year], 81 y.o.   MRN: [id number]  HPI Marvin Wagner is a 81 year old married male nonsmoker who comes in today for evaluation of what he calls vertigo  He said this morning he stood up and felt lightheaded. He had no vertigo. His blood pressure is 120/80. He's on 12.5 mg of a beta blocker daily because of a history of a flutter. He also has a history of dementia and is on Aricept and Namenda. He finds it difficult to recall any fax of his medical history. He's also had a valve replacement.   Review of Systems Review of systems otherwise negative    Objective:   Physical Exam  Well-developed well-nourished thin male no acute distress vital signs stable he is afebrile BP 120/80 pulse is 70 with an occasional skip      Assessment & Plan:  It sounds like he had an episode of hypotension. We'll decrease his beta blocker in half to 6 mg daily follow-up with Dr. Elease Hashimoto in one week

## 2014-12-03 NOTE — Progress Notes (Signed)
Pre visit review using our clinic review tool, if applicable. No additional management support is needed unless otherwise documented below in the visit note. 

## 2014-12-10 ENCOUNTER — Ambulatory Visit (INDEPENDENT_AMBULATORY_CARE_PROVIDER_SITE_OTHER): Payer: Medicare Other | Admitting: Family Medicine

## 2014-12-10 ENCOUNTER — Encounter: Payer: Self-pay | Admitting: Family Medicine

## 2014-12-10 VITALS — BP 122/78 | HR 66 | Temp 97.7°F | Resp 16 | Ht 70.5 in | Wt 143.2 lb

## 2014-12-10 DIAGNOSIS — R42 Dizziness and giddiness: Secondary | ICD-10-CM

## 2014-12-10 DIAGNOSIS — R413 Other amnesia: Secondary | ICD-10-CM | POA: Diagnosis not present

## 2014-12-10 DIAGNOSIS — I4892 Unspecified atrial flutter: Secondary | ICD-10-CM | POA: Diagnosis not present

## 2014-12-10 NOTE — Progress Notes (Signed)
   Subjective:    Patient ID: Marvin Wagner, male    DOB: 1930-11-27, 81 y.o.   MRN: [id number]  HPI   Patient seen last week for acute visit for transient dizziness. He describes some lightheadedness. No clear vertigo. It was suspected that he might have had hypotension though this was not confirmed. His blood pressure was stable on visit here. His Lopressor was decreased from 25-12.5 mg. He's had no episodes of dizziness whatsoever since then. He does have some dementia and history is somewhat unreliable. He is drinking fluids well. Denies any recent nausea, vomiting, or diarrhea. He states he is taking his Coumadin regularly for atrial flutter history but most recent INR 1.1 over one month ago. He apparently is not scheduled at this time for follow-up INR.  Past Medical History  Diagnosis Date  . Aortic stenosis     SEVERE  . Heart murmur   . Hypertension   . Hyperlipidemia   . Aortic insufficiency and aortic stenosis   . Mild memory disturbance   . CAD (coronary artery disease)    Past Surgical History  Procedure Laterality Date  . Cardiac catheterization    . Coronary angioplasty      NORMAL LEFT VENTRICULAR SIZEAND CONTRACTILITY WITH NORMAL SYSTOLIC FUNCTION. EF 65%  . Aortic valve replacement  12/17/08  . Tonsillectomy      reports that he quit smoking about 8 years ago. He does not have any smokeless tobacco history on file. He reports that he does not drink alcohol or use illicit drugs. family history includes Ankylosing spondylitis in his brother. Allergies  Allergen Reactions  . Penicillins       Review of Systems  Constitutional: Negative for fever, appetite change, fatigue and unexpected weight change.  Eyes: Negative for visual disturbance.  Respiratory: Negative for cough, chest tightness and shortness of breath.   Cardiovascular: Negative for chest pain, palpitations and leg swelling.  Gastrointestinal: Negative for nausea, vomiting and abdominal pain.    Genitourinary: Negative for dysuria.  Neurological: Negative for dizziness, syncope, weakness, light-headedness and headaches.  Psychiatric/Behavioral: Negative for agitation.       Objective:   Physical Exam  Constitutional: He appears well-developed and well-nourished.  HENT:  Head: Normocephalic and atraumatic.  Mouth/Throat: Oropharynx is clear and moist.  Neck: Neck supple. No thyromegaly present.  Cardiovascular: Normal rate.   Irregular rhythm but rate controlled  Pulmonary/Chest: Effort normal and breath sounds normal. No respiratory distress. He has no wheezes. He has no rales.  Musculoskeletal: He exhibits no edema.  Neurological: He is alert. No cranial nerve deficit.  Psychiatric: He has a normal mood and affect. His behavior is normal.          Assessment & Plan:  #1 recent lightheadedness. No evidence whatsoever for orthostasis today. Standing blood pressure 124/78. Stay well-hydrated. Continue current dose of Lopressor. #2 atrial flutter history. Patient is encouraged to take his Coumadin regularly. We scheduled for him to have repeat INR next Monday #3 dementia. I have significant concerns regarding him and his wife and their ability to take medications as prescribed. Will check with her daughter who has power of attorney who lives in [place]

## 2014-12-10 NOTE — Progress Notes (Signed)
Pre visit review using our clinic review tool, if applicable. No additional management support is needed unless otherwise documented below in the visit note. 

## 2014-12-15 ENCOUNTER — Encounter: Payer: Self-pay | Admitting: Cardiology

## 2014-12-15 ENCOUNTER — Ambulatory Visit: Payer: Medicare Other

## 2014-12-22 ENCOUNTER — Ambulatory Visit: Payer: Medicare Other

## 2014-12-29 ENCOUNTER — Ambulatory Visit: Payer: Medicare Other

## 2014-12-29 ENCOUNTER — Ambulatory Visit (INDEPENDENT_AMBULATORY_CARE_PROVIDER_SITE_OTHER): Payer: Medicare Other | Admitting: General Practice

## 2014-12-29 ENCOUNTER — Other Ambulatory Visit: Payer: Self-pay | Admitting: General Practice

## 2014-12-29 DIAGNOSIS — Z954 Presence of other heart-valve replacement: Secondary | ICD-10-CM

## 2014-12-29 DIAGNOSIS — Z952 Presence of prosthetic heart valve: Secondary | ICD-10-CM

## 2014-12-29 DIAGNOSIS — Z5181 Encounter for therapeutic drug level monitoring: Secondary | ICD-10-CM

## 2014-12-29 LAB — POCT INR: INR: 1.2

## 2014-12-29 MED ORDER — WARFARIN SODIUM 5 MG PO TABS: ORAL_TABLET | ORAL | Status: DC

## 2014-12-29 NOTE — Progress Notes (Signed)
Pre visit review using our clinic review tool, if applicable. No additional management support is needed unless otherwise documented below in the visit note. 

## 2015-01-01 ENCOUNTER — Telehealth: Payer: Self-pay | Admitting: Family Medicine

## 2015-01-01 ENCOUNTER — Other Ambulatory Visit: Payer: Medicare Other

## 2015-01-01 ENCOUNTER — Ambulatory Visit: Payer: Medicare Other | Admitting: Cardiology

## 2015-01-01 NOTE — Telephone Encounter (Signed)
Patient came in and stated his new pharmacy is [facility] on [address].  He wasn't sure what all to do about getting his meds switched over as well.

## 2015-01-02 NOTE — Telephone Encounter (Signed)
Spoke with patient and he will call back with list of medication he will need, or have [facility] fax a request.

## 2015-01-05 ENCOUNTER — Ambulatory Visit: Payer: Medicare Other

## 2015-01-06 DIAGNOSIS — L309 Dermatitis, unspecified: Secondary | ICD-10-CM | POA: Diagnosis not present

## 2015-01-06 DIAGNOSIS — L570 Actinic keratosis: Secondary | ICD-10-CM | POA: Diagnosis not present

## 2015-01-27 ENCOUNTER — Telehealth: Payer: Self-pay | Admitting: Family Medicine

## 2015-01-27 NOTE — Telephone Encounter (Signed)
Sharyn Lull, the pt's daughter called saying she's extremely concerned about his memory loss. On Christmas Eve relatives went to visit he and his wife. That afternoon he and his wife drove three hours to visit the same relative. When that relative made the comment, "Wow! Twice in one day huh?" The reply of Mr. Heino and his wife was, "What are you talking about we haven't seen you in months." She realizes she doesn't have POA or anything but she's wondering if something can be done regarding this and would like a phone call if possible.  Aldrich Redburn ph# [phone] Thank you.

## 2015-01-27 NOTE — Telephone Encounter (Signed)
Noted. Dr. Elease Hashimoto will have to provide intervention.

## 2015-01-29 ENCOUNTER — Ambulatory Visit: Payer: Medicare Other

## 2015-02-23 ENCOUNTER — Ambulatory Visit (INDEPENDENT_AMBULATORY_CARE_PROVIDER_SITE_OTHER): Payer: Medicare Other | Admitting: General Practice

## 2015-02-23 ENCOUNTER — Ambulatory Visit: Payer: Medicare Other

## 2015-02-23 DIAGNOSIS — Z5181 Encounter for therapeutic drug level monitoring: Secondary | ICD-10-CM

## 2015-02-23 DIAGNOSIS — I4892 Unspecified atrial flutter: Secondary | ICD-10-CM

## 2015-02-23 DIAGNOSIS — Z952 Presence of prosthetic heart valve: Secondary | ICD-10-CM

## 2015-02-23 DIAGNOSIS — Z954 Presence of other heart-valve replacement: Secondary | ICD-10-CM | POA: Diagnosis not present

## 2015-02-23 LAB — POCT INR: INR: 1

## 2015-02-23 NOTE — Progress Notes (Signed)
Pre visit review using our clinic review tool, if applicable. No additional management support is needed unless otherwise documented below in the visit note. INR is low.  Medication is not being consumed.  Patient did not realize why he was coming to the coumadin clinic and was not sure which  Medication is the coumadin.  I gave patient written instructions and attempted to call patient's daughter to see if she could intervene to make sure patient is getting his medicine.  I was unable to reach her.  Patient has a lot of social graces and wants me to believe that things are OK.  Dr. Elease Hashimoto has been notified of the situation.

## 2015-02-26 ENCOUNTER — Other Ambulatory Visit: Payer: Medicare Other

## 2015-02-26 ENCOUNTER — Ambulatory Visit: Payer: Medicare Other | Admitting: Cardiology

## 2015-03-02 ENCOUNTER — Ambulatory Visit: Payer: Medicare Other

## 2015-03-03 ENCOUNTER — Encounter: Payer: Self-pay | Admitting: Cardiology

## 2015-03-04 ENCOUNTER — Ambulatory Visit (INDEPENDENT_AMBULATORY_CARE_PROVIDER_SITE_OTHER): Payer: Medicare Other | Admitting: General Practice

## 2015-03-04 ENCOUNTER — Ambulatory Visit (INDEPENDENT_AMBULATORY_CARE_PROVIDER_SITE_OTHER): Payer: Medicare Other | Admitting: Family Medicine

## 2015-03-04 ENCOUNTER — Ambulatory Visit: Payer: Medicare Other | Admitting: Family Medicine

## 2015-03-04 VITALS — BP 100/80 | HR 74 | Temp 97.8°F | Ht 70.5 in | Wt 143.4 lb

## 2015-03-04 DIAGNOSIS — F0390 Unspecified dementia without behavioral disturbance: Secondary | ICD-10-CM | POA: Insufficient documentation

## 2015-03-04 DIAGNOSIS — Z5181 Encounter for therapeutic drug level monitoring: Secondary | ICD-10-CM | POA: Diagnosis not present

## 2015-03-04 DIAGNOSIS — Z954 Presence of other heart-valve replacement: Secondary | ICD-10-CM

## 2015-03-04 DIAGNOSIS — F0391 Unspecified dementia with behavioral disturbance: Secondary | ICD-10-CM | POA: Diagnosis not present

## 2015-03-04 DIAGNOSIS — I4892 Unspecified atrial flutter: Secondary | ICD-10-CM

## 2015-03-04 DIAGNOSIS — Z952 Presence of prosthetic heart valve: Secondary | ICD-10-CM

## 2015-03-04 LAB — POCT INR: INR: 1.3

## 2015-03-04 NOTE — Progress Notes (Signed)
Pre visit review using our clinic review tool, if applicable. No additional management support is needed unless otherwise documented below in the visit note. 

## 2015-03-04 NOTE — Progress Notes (Signed)
Pre visit review using our clinic review tool, if applicable. No additional management support is needed unless otherwise documented below in the visit note.  INR is low today.  Not surprising due to memory issues.  Pt states that he and wife will be having help in the home 4 hours daily and that this person will help them get their medications on time.  Boosted patient for 2 days and continued same dosage.  Re-check in 1 week.  I did discuss the risks involved with not taking medication.  Pt verbalized understanding and thanked me for being concerned.

## 2015-03-04 NOTE — Progress Notes (Signed)
   Subjective:    Patient ID: Marvin Wagner, male    DOB: 11-04-1930, 82 y.o.   MRN: [id number]  HPI    Patient is here today accompanied by wife to discuss chronic care issues. Both he and his wife have advanced dementia. His wife just left the hospital following a fall. We have had some major concerns recently regarding safety in terms of proper administration medications and also concerns regarding their ability take care of themselves. We have initiated referral to Adult Protective Services and that is underway. We have strongly advised that they get a higher level of care and the family is looking into that now. This was communicated last week to the hospital physician caring for her.  They currently do not have power of attorney and basically refused to allow their daughters' intervention and assistance.   Husband has still been driving and we've had some concerns with that and have asked that he undergo driving evaluation  He has had difficulty with medications.  For example, he had recent INR of 1.0 indicating that he had not been taking his coumadin.  Past Medical History  Diagnosis Date  . Aortic stenosis     SEVERE  . Heart murmur   . Hypertension   . Hyperlipidemia   . Aortic insufficiency and aortic stenosis   . Mild memory disturbance   . CAD (coronary artery disease)    Past Surgical History  Procedure Laterality Date  . Cardiac catheterization    . Coronary angioplasty      NORMAL LEFT VENTRICULAR SIZEAND CONTRACTILITY WITH NORMAL SYSTOLIC FUNCTION. EF 65%  . Aortic valve replacement  12/17/08  . Tonsillectomy      reports that he quit smoking about 8 years ago. He does not have any smokeless tobacco history on file. He reports that he does not drink alcohol or use illicit drugs. family history includes Ankylosing spondylitis in his brother. Allergies  Allergen Reactions  . Penicillins       Review of Systems  Constitutional: Negative for fever and chills.    Respiratory: Negative for shortness of breath.   Cardiovascular: Negative for chest pain.  Gastrointestinal: Negative for abdominal pain.  Genitourinary: Negative for dysuria.  Musculoskeletal: Negative for back pain.  Neurological: Negative for headaches.       Objective:   Physical Exam  Constitutional: He appears well-developed and well-nourished.  HENT:  Mouth/Throat: Oropharynx is clear and moist.  Neck: Neck supple.  Cardiovascular: Normal rate.   Regular rhythm rate controlled  Pulmonary/Chest: Effort normal and breath sounds normal. No respiratory distress. He has no wheezes. He has no rales.  Abdominal: There is no tenderness.  Musculoskeletal: He exhibits no edema.  Neurological: He is alert.          Assessment & Plan:  Advanced dementia. We have significant concerns regarding several issues with his current situation. In my medical opinion, neither he nor his wife can safely manage their affairs.  He is needing assistance with medications and increased care in general. We have highly advised at least assisted level of care versus their current "independent living". Family are aware of our concerns. We have already initiated Adult [employer] social work intervention. We've recommended driving evaluation.  I spent over 25 minutes with patient and his wife and another friend of the family and over 50% of this time was directly counseling them regarding our recommendation for additional services.

## 2015-03-11 ENCOUNTER — Telehealth: Payer: Self-pay | Admitting: Family Medicine

## 2015-03-11 ENCOUNTER — Ambulatory Visit (INDEPENDENT_AMBULATORY_CARE_PROVIDER_SITE_OTHER): Payer: Medicare Other | Admitting: General Practice

## 2015-03-11 DIAGNOSIS — Z5181 Encounter for therapeutic drug level monitoring: Secondary | ICD-10-CM

## 2015-03-11 DIAGNOSIS — Z952 Presence of prosthetic heart valve: Secondary | ICD-10-CM

## 2015-03-11 DIAGNOSIS — Z954 Presence of other heart-valve replacement: Secondary | ICD-10-CM

## 2015-03-11 LAB — POCT INR: INR: 1.1

## 2015-03-11 MED ORDER — MEMANTINE HCL 10 MG PO TABS: 10.0000 mg | ORAL_TABLET | Freq: Two times a day (BID) | ORAL | Status: DC

## 2015-03-11 MED ORDER — ATORVASTATIN CALCIUM 10 MG PO TABS: 10.0000 mg | ORAL_TABLET | Freq: Every day | ORAL | Status: DC

## 2015-03-11 NOTE — Addendum Note (Signed)
Addended by: Elio Forget on: 03/11/2015 01:15 PM   Modules accepted: Orders

## 2015-03-11 NOTE — Telephone Encounter (Signed)
Marvin Wagner is calling pt needs refill lipitor 10 mg #90 ,namenda 10 mg #180 .[facility] [address]

## 2015-03-11 NOTE — Progress Notes (Signed)
Pre visit review using our clinic review tool, if applicable. No additional management support is needed unless otherwise documented below in the visit note. INR is low today due to patient not taking medication.  Pt now has a care giver that will be helping him and his wife with their medications.  Boosted patient for 2 days and then continued patient on current dosage.  Re-check in 1 week.  Instructions faxed to Butch Penny, Therapist, sports at [facility].

## 2015-03-11 NOTE — Patient Instructions (Signed)
2/8 - 10 mg 2/9 - 10 mg 2/10 - 7.5 mg 2/11 - 5 mg 2/12 - 5 mg 2/13 - 7.5 mg 2/14 - 5 mg 2/15 - Re-check INR

## 2015-03-11 NOTE — Telephone Encounter (Signed)
Pt has an appt today and donna would like to know how pt is taking his coumadin. Please fax order [phone]

## 2015-03-11 NOTE — Telephone Encounter (Signed)
Spoke with Butch Penny, RN and coordinated efforts so that patient will be sure to get his medications in a timely manner.  Donna's # is [phone] Ext: 4.

## 2015-03-11 NOTE — Telephone Encounter (Signed)
Medication filled for patient.  

## 2015-03-12 ENCOUNTER — Encounter: Payer: Self-pay | Admitting: Cardiology

## 2015-03-12 ENCOUNTER — Other Ambulatory Visit (INDEPENDENT_AMBULATORY_CARE_PROVIDER_SITE_OTHER): Payer: Medicare Other

## 2015-03-12 ENCOUNTER — Ambulatory Visit (INDEPENDENT_AMBULATORY_CARE_PROVIDER_SITE_OTHER): Payer: Medicare Other | Admitting: Cardiology

## 2015-03-12 VITALS — BP 130/62 | HR 50 | Ht 69.0 in | Wt 143.8 lb

## 2015-03-12 DIAGNOSIS — Z954 Presence of other heart-valve replacement: Secondary | ICD-10-CM | POA: Diagnosis not present

## 2015-03-12 DIAGNOSIS — I4892 Unspecified atrial flutter: Secondary | ICD-10-CM

## 2015-03-12 DIAGNOSIS — Z951 Presence of aortocoronary bypass graft: Secondary | ICD-10-CM

## 2015-03-12 DIAGNOSIS — I119 Hypertensive heart disease without heart failure: Secondary | ICD-10-CM | POA: Diagnosis not present

## 2015-03-12 DIAGNOSIS — Z952 Presence of prosthetic heart valve: Secondary | ICD-10-CM

## 2015-03-12 LAB — BASIC METABOLIC PANEL
BUN: 17 mg/dL (ref 7–25)
CALCIUM: 9.5 mg/dL (ref 8.6–10.3)
CHLORIDE: 100 mmol/L (ref 98–110)
CO2: 28 mmol/L (ref 20–31)
CREATININE: 1.02 mg/dL (ref 0.70–1.11)
Glucose, Bld: 90 mg/dL (ref 65–99)
Potassium: 4.5 mmol/L (ref 3.5–5.3)
Sodium: 138 mmol/L (ref 135–146)

## 2015-03-12 LAB — LIPID PANEL
CHOL/HDL RATIO: 3 ratio (ref <=5.0)
Cholesterol: 187 mg/dL (ref 125–200)
HDL: 63 mg/dL (ref >=40)
LDL CALC: 105 mg/dL (ref <130)
TRIGLYCERIDES: 93 mg/dL (ref <150)
VLDL: 19 mg/dL (ref <30)

## 2015-03-12 LAB — HEPATIC FUNCTION PANEL
ALBUMIN: 4.2 g/dL (ref 3.6–5.1)
ALK PHOS: 49 U/L (ref 40–115)
ALT: 11 U/L (ref 9–46)
AST: 17 U/L (ref 10–35)
Bilirubin, Direct: 0.1 mg/dL (ref <=0.2)
Indirect Bilirubin: 0.4 mg/dL (ref 0.2–1.2)
TOTAL PROTEIN: 6.9 g/dL (ref 6.1–8.1)
Total Bilirubin: 0.5 mg/dL (ref 0.2–1.2)

## 2015-03-12 MED ORDER — ATORVASTATIN CALCIUM 10 MG PO TABS: 10.0000 mg | ORAL_TABLET | Freq: Every day | ORAL | Status: DC

## 2015-03-12 MED ORDER — MEMANTINE HCL 10 MG PO TABS: 10.0000 mg | ORAL_TABLET | Freq: Two times a day (BID) | ORAL | Status: DC

## 2015-03-12 NOTE — Telephone Encounter (Signed)
Medication  sent to wrong pharm. The medication needs to be sent to [facility] [address]

## 2015-03-12 NOTE — Patient Instructions (Signed)
Medication Instructions:  Your physician recommends that you continue on your current medications as directed. Please refer to the Current Medication list given to you today.  Labwork: Lp/bmet/hfp  Testing/Procedures: none  Follow-Up: Your physician wants you to follow-up in: 6 month ov with Dr Sallyanne Kuster  You will receive a reminder letter in the mail two months in advance. If you don't receive a letter, please call our office to schedule the follow-up appointment.  If you need a refill on your cardiac medications before your next appointment, please call your pharmacy.

## 2015-03-12 NOTE — Addendum Note (Signed)
Addended by: Elio Forget on: 03/12/2015 04:24 PM   Modules accepted: Orders

## 2015-03-12 NOTE — Progress Notes (Signed)
Cardiology Office Note   Date:  03/12/2015   ID:  Marvin Wagner, DOB 11/11/1930, MRN [id number]  PCP:  Eulas Post, MD  Cardiologist: Darlin Coco MD  No chief complaint on file.     History of Present Illness: Marvin Wagner is a 82 y.o. male who presents for Six-month follow-up visit  He has a past history of severe aortic stenosis. On 12/17/08 he underwent aortic valve replacement with a pericardial tissue valve by Dr. Roxy Manns. He also underwent single-vessel CABG at the time of his aortic valve replacement. The patient has been doing well. He exercises on his own. . . Has been feeling well with no new cardiac symptoms. He does a lot of deep knee bends and has strengthened his legs. Since last visit he has had no new cardiac symptoms. Specifically he denies chest pain or shortness of breath. He has not had any TIA symptoms. He has not been aware of any palpitations or irregular heartbeat. He Did not have any past history of atrial flutter fibrillation. In November 2014 the patient was found to be in asymptomatic atrial flutter with controlled ventricular response and has been on warfarin since that time. Outpatient cardioversion was planned. However when the patient returned on 01/23/13 to discuss cardioversion it was noted that he had gone back into normal rhythm on his own. He has continued on warfarin. He himself is not aware of when he goes in and out of atrial flutter. Since we last saw him his wife has had several falls. They have moved to a retirement home [place] [address]. Since last visit the patient has not had any recurrence of his atrial fibrillation. He remains on warfarin. His warfarin is checked at Dr. Erick Blinks office. Previously we had been concern with nonintentional weight loss.  However his weight has now stabilized.  He states that he is eating well.  He has wife are living at a retirement center called [facility] and they were very pleased  with the food.  Past Medical History  Diagnosis Date  . Aortic stenosis     SEVERE  . Heart murmur   . Hypertension   . Hyperlipidemia   . Aortic insufficiency and aortic stenosis   . Mild memory disturbance   . CAD (coronary artery disease)     Past Surgical History  Procedure Laterality Date  . Cardiac catheterization    . Coronary angioplasty      NORMAL LEFT VENTRICULAR SIZEAND CONTRACTILITY WITH NORMAL SYSTOLIC FUNCTION. EF 65%  . Aortic valve replacement  12/17/08  . Tonsillectomy       Current Outpatient Prescriptions  Medication Sig Dispense Refill  . acetaminophen (TYLENOL) 500 MG tablet Take 500 mg by mouth every 6 (six) hours as needed (for pain).     Marland Kitchen atorvastatin (LIPITOR) 10 MG tablet Take 1 tablet (10 mg total) by mouth daily. 90 tablet 2  . calcium carbonate (OS-CAL) 600 MG TABS Take 600 mg by mouth 2 (two) times daily with a meal.      . Cholecalciferol (VITAMIN D) 2000 UNITS tablet Take 2,000 Units by mouth daily.      Marland Kitchen donepezil (ARICEPT) 10 MG tablet Take 1 tablet (10 mg total) by mouth at bedtime. 30 tablet 5  . memantine (NAMENDA) 10 MG tablet Take 1 tablet (10 mg total) by mouth 2 (two) times daily. 60 tablet 11  . memantine (NAMENDA) 10 MG tablet Take 1 tablet (10 mg total) by mouth 2 (  two) times daily. 180 tablet 2  . metoprolol tartrate (LOPRESSOR) 25 MG tablet Take 12.5 mg by mouth daily.    . Multiple Vitamin (MULTIVITAMIN) tablet Take 1 tablet by mouth daily.      . vitamin C (ASCORBIC ACID) 500 MG tablet Take 500 mg by mouth daily.      Marland Kitchen warfarin (COUMADIN) 5 MG tablet Take as directed by anticoagulation clinic 40 tablet 3   No current facility-administered medications for this visit.    Allergies:   Penicillins    Social History:  The patient  reports that he quit smoking about 8 years ago. He does not have any smokeless tobacco history on file. He reports that he does not drink alcohol or use illicit drugs.   Family History:  The  patient's family history includes Ankylosing spondylitis in his brother.    ROS:  Please see the history of present illness.   Otherwise, review of systems are positive for none.   All other systems are reviewed and negative.    PHYSICAL EXAM: VS:  BP 130/62 mmHg  Pulse 50  Ht 5\' 9"  (1.753 m)  Wt 143 lb 12.8 oz (65.227 kg)  BMI 21.23 kg/m2 , BMI Body mass index is 21.23 kg/(m^2). GEN: Well nourished, well developed, in no acute distress HEENT: normal Neck: no JVD, carotid bruits, or masses Cardiac: RRR; There is a grade 2/6 systolic murmur across the prosthetic aortic valve.  There is no diastolic murmur.  There is no rubs, or gallops,no edema  Respiratory:  clear to auscultation bilaterally, normal work of breathing GI: soft, nontender, nondistended, + BS MS: no deformity or atrophy Skin: warm and dry, no rash Neuro:  Strength and sensation are intact Psych: euthymic mood, full affect   EKG:  EKG is ordered today. The ekg ordered today demonstrates Sinus bradycardia at 50 bpm.  Occasional PVCs.  There is left axis deviation.   Recent Labs: 09/26/2014: ALT 14; BUN 15; Creatinine, Ser 0.74; Hemoglobin 13.0; Platelets 138.0*; Potassium 3.7; Sodium 140; TSH 1.62    Lipid Panel    Component Value Date/Time   CHOL 111 10/17/2013 1202   TRIG 75.0 10/17/2013 1202   HDL 43.10 10/17/2013 1202   CHOLHDL 3 10/17/2013 1202   VLDL 15.0 10/17/2013 1202   LDLCALC 53 10/17/2013 1202      Wt Readings from Last 3 Encounters:  03/12/15 143 lb 12.8 oz (65.227 kg)  03/04/15 143 lb 6.4 oz (65.046 kg)  12/10/14 143 lb 3.2 oz (64.955 kg)        ASSESSMENT AND PLAN:  1. Paroxysmal atrial flutter. On long-term Coumadin. Chadssvasc score of 3-4 2. hypertensive cardiovascular disease without heart failure. 3. history of CABG on 12/17/08. No recurrent chest pain 4. history of aortic valve replacement with pericardial tissue valve on 12/17/08 5. unintentional weight loss, which has  stabilized. No change in weight since November 2015 6. memory disorder  Disposition: Continue current medication. Recheck in 6 months for office visit and EKG.   Current medicines are reviewed at length with the patient today.  The patient does not have concerns regarding medicines.  The following changes have been made:  no change  Labs/ tests ordered today include:   Orders Placed This Encounter  Procedures  . Lipid panel  . Hepatic function panel  . Basic metabolic panel  . EKG 12-Lead     Disposition:  We are checking fasting lab work today.  He will return in 6 months for follow-up  office visit and EKG with Dr. Sallyanne Kuster.  Berna Spare MD 03/12/2015 1:35 PM    [facility] [place], [place], [place]  [address] Phone: [phone]; Fax: [phone]

## 2015-03-13 NOTE — Progress Notes (Signed)
Quick Note:  Please report to patient. The recent labs are stable. Continue same medication and careful diet. ______ 

## 2015-03-18 ENCOUNTER — Ambulatory Visit (INDEPENDENT_AMBULATORY_CARE_PROVIDER_SITE_OTHER): Payer: Medicare Other | Admitting: General Practice

## 2015-03-18 DIAGNOSIS — Z954 Presence of other heart-valve replacement: Secondary | ICD-10-CM | POA: Diagnosis not present

## 2015-03-18 DIAGNOSIS — I4892 Unspecified atrial flutter: Secondary | ICD-10-CM | POA: Diagnosis not present

## 2015-03-18 DIAGNOSIS — Z5181 Encounter for therapeutic drug level monitoring: Secondary | ICD-10-CM

## 2015-03-18 DIAGNOSIS — Z952 Presence of prosthetic heart valve: Secondary | ICD-10-CM

## 2015-03-18 LAB — POCT INR: INR: 1.8

## 2015-03-18 NOTE — Progress Notes (Signed)
Pre visit review using our clinic review tool, if applicable. No additional management support is needed unless otherwise documented below in the visit note. 

## 2015-04-01 ENCOUNTER — Ambulatory Visit (INDEPENDENT_AMBULATORY_CARE_PROVIDER_SITE_OTHER): Payer: Medicare Other | Admitting: Family Medicine

## 2015-04-01 ENCOUNTER — Encounter: Payer: Self-pay | Admitting: Family Medicine

## 2015-04-01 ENCOUNTER — Ambulatory Visit: Payer: Medicare Other

## 2015-04-01 VITALS — BP 130/70 | HR 58 | Temp 97.2°F | Wt 144.5 lb

## 2015-04-01 DIAGNOSIS — F0390 Unspecified dementia without behavioral disturbance: Secondary | ICD-10-CM

## 2015-04-01 DIAGNOSIS — Z954 Presence of other heart-valve replacement: Secondary | ICD-10-CM

## 2015-04-01 DIAGNOSIS — Z952 Presence of prosthetic heart valve: Secondary | ICD-10-CM

## 2015-04-01 DIAGNOSIS — Z5181 Encounter for therapeutic drug level monitoring: Secondary | ICD-10-CM

## 2015-04-01 LAB — POCT INR: INR: 1.3

## 2015-04-01 NOTE — Progress Notes (Signed)
Pre visit review using our clinic review tool, if applicable. No additional management support is needed unless otherwise documented below in the visit note. 

## 2015-04-01 NOTE — Progress Notes (Signed)
   Subjective:    Patient ID: Marvin Wagner, male    DOB: Jul 09, 1930, 82 y.o.   MRN: [id number]  HPI  patient seen for routine medical follow-up  He has history of fairly advanced dementia , hyperlipidemia , CAD , atrial flutter , and history of aortic valve replacement. We have had recent challenges with appropriate medication taking and contacted Adult Protective Services. They currently have assistance with [name]- for assistance with medications and transportation.  Patient and his wife are somewhat agitated with the fact that they do not feel they need 8 hour supervision during the day. He denies any recent falls. No chest pains. No appetite changes.  Past Medical History  Diagnosis Date  . Aortic stenosis     SEVERE  . Heart murmur   . Hypertension   . Hyperlipidemia   . Aortic insufficiency and aortic stenosis   . Mild memory disturbance   . CAD (coronary artery disease)    Past Surgical History  Procedure Laterality Date  . Cardiac catheterization    . Coronary angioplasty      NORMAL LEFT VENTRICULAR SIZEAND CONTRACTILITY WITH NORMAL SYSTOLIC FUNCTION. EF 65%  . Aortic valve replacement  12/17/08  . Tonsillectomy      reports that he quit smoking about 8 years ago. He does not have any smokeless tobacco history on file. He reports that he does not drink alcohol or use illicit drugs. family history includes Ankylosing spondylitis in his brother. Allergies  Allergen Reactions  . Penicillins       Review of Systems  Constitutional: Negative for fatigue.  Eyes: Negative for visual disturbance.  Respiratory: Negative for cough, chest tightness and shortness of breath.   Cardiovascular: Negative for chest pain, palpitations and leg swelling.  Neurological: Negative for dizziness, syncope, weakness, light-headedness and headaches.       Objective:   Physical Exam  Constitutional: He appears well-developed and well-nourished.  Cardiovascular: Normal rate.     Irregular rhythm  Pulmonary/Chest: Effort normal and breath sounds normal. No respiratory distress. He has no wheezes. He has no rales.  Musculoskeletal: He exhibits no edema.  Neurological: He is alert.          Assessment & Plan:   Alzheimer's dementia. Continue Aricept and Namenda. We do not feel it is safe for him to drive at this point nor to take his medications without supervision and we have acquired help for both.

## 2015-04-02 ENCOUNTER — Telehealth: Payer: Self-pay | Admitting: Family Medicine

## 2015-04-02 ENCOUNTER — Ambulatory Visit (INDEPENDENT_AMBULATORY_CARE_PROVIDER_SITE_OTHER): Payer: Medicare Other | Admitting: General Practice

## 2015-04-02 DIAGNOSIS — I4892 Unspecified atrial flutter: Secondary | ICD-10-CM

## 2015-04-02 DIAGNOSIS — Z954 Presence of other heart-valve replacement: Secondary | ICD-10-CM

## 2015-04-02 DIAGNOSIS — Z952 Presence of prosthetic heart valve: Secondary | ICD-10-CM

## 2015-04-02 DIAGNOSIS — Z5181 Encounter for therapeutic drug level monitoring: Secondary | ICD-10-CM

## 2015-04-02 NOTE — Telephone Encounter (Signed)
Patient wants a call back to discuss an issue.

## 2015-04-02 NOTE — Telephone Encounter (Signed)
Faxed Butch Penny new instructions for coumadin dosing this morning.

## 2015-04-02 NOTE — Telephone Encounter (Addendum)
Pt saw Dr Elease Hashimoto yesterday and did his coum then, so he missed his 2:30 appt with you.  Butch Penny with [name] would like to know if there are any changes to his dose. Looks like coum results did not get here until after 5pm. Thank you Jenny Reichmann!  Fax: [phone]

## 2015-04-02 NOTE — Progress Notes (Signed)
I have reviewed and agree with the plan. 

## 2015-04-02 NOTE — Telephone Encounter (Signed)
Spoke with patient to let him know that we have faxed his new coumadin results to [facility].

## 2015-04-02 NOTE — Progress Notes (Signed)
Pre visit review using our clinic review tool, if applicable. No additional management support is needed unless otherwise documented below in the visit note. Pt is low today.  Patient is having memory issues but has a care-taker that comes in the home a couple times daily to help with meals and medications.  I faxed orders to Butch Penny, RN @ [facility] for coumadin dosing.

## 2015-04-06 ENCOUNTER — Ambulatory Visit (INDEPENDENT_AMBULATORY_CARE_PROVIDER_SITE_OTHER): Payer: Medicare Other | Admitting: General Practice

## 2015-04-06 DIAGNOSIS — I4892 Unspecified atrial flutter: Secondary | ICD-10-CM | POA: Diagnosis not present

## 2015-04-06 DIAGNOSIS — Z5181 Encounter for therapeutic drug level monitoring: Secondary | ICD-10-CM

## 2015-04-06 DIAGNOSIS — Z952 Presence of prosthetic heart valve: Secondary | ICD-10-CM

## 2015-04-06 DIAGNOSIS — Z954 Presence of other heart-valve replacement: Secondary | ICD-10-CM

## 2015-04-06 LAB — POCT INR: INR: 1.8

## 2015-04-06 NOTE — Progress Notes (Signed)
Pre visit review using our clinic review tool, if applicable. No additional management support is needed unless otherwise documented below in the visit note. 

## 2015-04-07 ENCOUNTER — Telehealth: Payer: Self-pay | Admitting: Family Medicine

## 2015-04-07 NOTE — Telephone Encounter (Signed)
Patient came in today stating that he feels like he is a prisoner in his own home. Patient states that his biggest problem is traveling. Patient states he does not feel comfortable with drivers and they inexperience. Patient states he usually rides in the backseat, and the ride is very rough and cant navigate the place and does not want to expose his family to the drivers. Patient states that they will not let him drive and he has not had a mishap in the 70 years that he has been driving. He states that he legal to drive. Patient states that he is not willing to let them drive him around. And he very capable of driving. They appreciate the help with the medication, and the only value service they are receiving. Patient states that they are very self sufficient. Patient also wants a RX for his scalp and his headaches.

## 2015-04-07 NOTE — Telephone Encounter (Signed)
We cannot support him driving at this time secondary to advanced dementia.  Adult Protective Services has been very helpful in getting some assistance in this and I am not surprised that he is not receptive to this.

## 2015-04-08 NOTE — Telephone Encounter (Signed)
Adult protective services are handling this.

## 2015-04-30 ENCOUNTER — Telehealth: Payer: Self-pay | Admitting: General Practice

## 2015-04-30 ENCOUNTER — Ambulatory Visit (INDEPENDENT_AMBULATORY_CARE_PROVIDER_SITE_OTHER): Payer: Medicare Other | Admitting: Family Medicine

## 2015-04-30 ENCOUNTER — Encounter: Payer: Self-pay | Admitting: Family Medicine

## 2015-04-30 ENCOUNTER — Ambulatory Visit (INDEPENDENT_AMBULATORY_CARE_PROVIDER_SITE_OTHER): Payer: Medicare Other | Admitting: General Practice

## 2015-04-30 VITALS — BP 140/80 | HR 69 | Temp 98.5°F | Ht 70.5 in | Wt 145.8 lb

## 2015-04-30 DIAGNOSIS — F0390 Unspecified dementia without behavioral disturbance: Secondary | ICD-10-CM

## 2015-04-30 DIAGNOSIS — E785 Hyperlipidemia, unspecified: Secondary | ICD-10-CM

## 2015-04-30 DIAGNOSIS — Z5181 Encounter for therapeutic drug level monitoring: Secondary | ICD-10-CM

## 2015-04-30 DIAGNOSIS — Z954 Presence of other heart-valve replacement: Secondary | ICD-10-CM

## 2015-04-30 DIAGNOSIS — I4892 Unspecified atrial flutter: Secondary | ICD-10-CM | POA: Diagnosis not present

## 2015-04-30 DIAGNOSIS — Z952 Presence of prosthetic heart valve: Secondary | ICD-10-CM

## 2015-04-30 NOTE — Telephone Encounter (Signed)
Dosing instructions for coumadin faxed to Butch Penny, RN @ [facility] @ [phone].

## 2015-04-30 NOTE — Progress Notes (Signed)
   Subjective:    Patient ID: Marvin Wagner, male    DOB: [birth year], 82 y.o.   MRN: [id number]  HPI   Patient for medical follow-up.  Chronic problems include history of aortic valve replacement, atrial flutter, chronic anticoagulation, hypertension, history of CAD, hyperlipidemia, dementia.   He and his wife live in assisted living. He has recently been having other people transport him. He's had difficulties with compliance with medication. We had substantial concerns regarding several safety issues especially with regard to medication and had Adult Protective Services go out recently. They're currently getting assistance with medications on a daily basis though he and his wife are resistant to this idea. He is also very resistant idea of giving up his driving. He remains on Namenda and Aricept. Coumadin followed through Coumadin clinic. Patient states his appetite and weight are stable. History limited reliability from patient.  Past Medical History  Diagnosis Date  . Aortic stenosis     SEVERE  . Heart murmur   . Hypertension   . Hyperlipidemia   . Aortic insufficiency and aortic stenosis   . Mild memory disturbance   . CAD (coronary artery disease)    Past Surgical History  Procedure Laterality Date  . Cardiac catheterization    . Coronary angioplasty      NORMAL LEFT VENTRICULAR SIZEAND CONTRACTILITY WITH NORMAL SYSTOLIC FUNCTION. EF 65%  . Aortic valve replacement  12/17/08  . Tonsillectomy      reports that he quit smoking about 8 years ago. He does not have any smokeless tobacco history on file. He reports that he does not drink alcohol or use illicit drugs. family history includes Ankylosing spondylitis in his brother. Allergies  Allergen Reactions  . Penicillins      Review of Systems  Constitutional: Negative for appetite change, fatigue and unexpected weight change.  Eyes: Negative for visual disturbance.  Respiratory: Negative for cough, chest tightness and  shortness of breath.   Cardiovascular: Negative for chest pain, palpitations and leg swelling.  Gastrointestinal: Negative for abdominal pain and blood in stool.  Genitourinary: Negative for hematuria.  Musculoskeletal: Negative for gait problem.  Neurological: Negative for dizziness, syncope, weakness, light-headedness and headaches.       Objective:   Physical Exam  Constitutional: He appears well-developed and well-nourished.  Neck: Neck supple.  Cardiovascular: Normal rate.   Irregular rhythm but rate controlled  Pulmonary/Chest: Effort normal and breath sounds normal. No respiratory distress. He has no wheezes. He has no rales.  Musculoskeletal: He exhibits no edema.  Neurological: He is alert.  Psychiatric:  MMSE 26/30          Assessment & Plan:   #1  Dementia. Repeat MMSE 26 out of 30 which is somewhat surprising since previous score was 22. We've recommend he continue Namenda and Aricept. Do feel he would benefit from continued oversight of medications-  especially with being on Coumadin   #2 hx of atrial flutter.  INR better to goal since oversight of medications in place  #3 hyperlipidemia.  Recent lipids reviewed with pt.

## 2015-04-30 NOTE — Progress Notes (Signed)
Pre visit review using our clinic review tool, if applicable. No additional management support is needed unless otherwise documented below in the visit note. 

## 2015-05-01 ENCOUNTER — Other Ambulatory Visit: Payer: Self-pay | Admitting: Cardiology

## 2015-05-04 ENCOUNTER — Other Ambulatory Visit: Payer: Self-pay | Admitting: General Practice

## 2015-05-04 ENCOUNTER — Ambulatory Visit: Payer: Medicare Other

## 2015-05-04 MED ORDER — WARFARIN SODIUM 5 MG PO TABS: ORAL_TABLET | ORAL | Status: DC

## 2015-05-18 ENCOUNTER — Telehealth: Payer: Self-pay | Admitting: Family Medicine

## 2015-05-18 NOTE — Telephone Encounter (Signed)
Pt would like to see if dr burchette  Can call him. They would like to hire a full time nurse to assist with his wife. They have a hearing come up this Wednesday. Pt daughter is trying get her mother reassigned.

## 2015-05-20 ENCOUNTER — Other Ambulatory Visit: Payer: Self-pay | Admitting: Family Medicine

## 2015-05-20 MED ORDER — DONEPEZIL HCL 10 MG PO TABS: 10.0000 mg | ORAL_TABLET | Freq: Every day | ORAL | Status: DC

## 2015-05-20 NOTE — Telephone Encounter (Signed)
Butch Penny from [facility] is working on getting them.

## 2015-05-20 NOTE — Telephone Encounter (Signed)
Pt has a pending appt tomorrow. Have you seen any records from [facility]?

## 2015-05-20 NOTE — Telephone Encounter (Signed)
I have not seen any records 

## 2015-05-20 NOTE — Telephone Encounter (Signed)
Pt  wife has an appointment

## 2015-05-27 ENCOUNTER — Ambulatory Visit (INDEPENDENT_AMBULATORY_CARE_PROVIDER_SITE_OTHER): Payer: Medicare Other | Admitting: General Practice

## 2015-05-27 DIAGNOSIS — Z954 Presence of other heart-valve replacement: Secondary | ICD-10-CM | POA: Diagnosis not present

## 2015-05-27 DIAGNOSIS — Z5181 Encounter for therapeutic drug level monitoring: Secondary | ICD-10-CM

## 2015-05-27 DIAGNOSIS — Z952 Presence of prosthetic heart valve: Secondary | ICD-10-CM

## 2015-05-27 DIAGNOSIS — I4892 Unspecified atrial flutter: Secondary | ICD-10-CM | POA: Diagnosis not present

## 2015-05-27 LAB — POCT INR: INR: 1.7

## 2015-05-27 NOTE — Progress Notes (Signed)
Pre visit review using our clinic review tool, if applicable. No additional management support is needed unless otherwise documented below in the visit note. 

## 2015-06-22 ENCOUNTER — Ambulatory Visit (INDEPENDENT_AMBULATORY_CARE_PROVIDER_SITE_OTHER): Payer: Medicare Other | Admitting: General Practice

## 2015-06-22 DIAGNOSIS — Z952 Presence of prosthetic heart valve: Secondary | ICD-10-CM

## 2015-06-22 DIAGNOSIS — Z5181 Encounter for therapeutic drug level monitoring: Secondary | ICD-10-CM

## 2015-06-22 DIAGNOSIS — Z954 Presence of other heart-valve replacement: Secondary | ICD-10-CM | POA: Diagnosis not present

## 2015-06-22 LAB — POCT INR: INR: 1.8

## 2015-06-22 NOTE — Progress Notes (Signed)
Pre visit review using our clinic review tool, if applicable. No additional management support is needed unless otherwise documented below in the visit note. 

## 2015-07-20 ENCOUNTER — Ambulatory Visit (INDEPENDENT_AMBULATORY_CARE_PROVIDER_SITE_OTHER): Payer: Medicare Other | Admitting: General Practice

## 2015-07-20 DIAGNOSIS — I4892 Unspecified atrial flutter: Secondary | ICD-10-CM

## 2015-07-20 DIAGNOSIS — Z954 Presence of other heart-valve replacement: Secondary | ICD-10-CM | POA: Diagnosis not present

## 2015-07-20 DIAGNOSIS — Z5181 Encounter for therapeutic drug level monitoring: Secondary | ICD-10-CM | POA: Diagnosis not present

## 2015-07-20 LAB — POCT INR: INR: 2

## 2015-07-20 NOTE — Progress Notes (Signed)
Pre visit review using our clinic review tool, if applicable. No additional management support is needed unless otherwise documented below in the visit note. 

## 2015-08-07 ENCOUNTER — Telehealth: Payer: Self-pay | Admitting: Family Medicine

## 2015-08-07 MED ORDER — METOPROLOL TARTRATE 25 MG PO TABS: 12.5000 mg | ORAL_TABLET | Freq: Every day | ORAL | Status: DC

## 2015-08-07 NOTE — Telephone Encounter (Signed)
Medication refilled for pt. Per [facility] pts medications go to [facility].

## 2015-08-07 NOTE — Telephone Encounter (Signed)
Pt needs refill on metoprolol tartrate 25 mg #90 w.refills takes a half daily . [facility]

## 2015-08-17 ENCOUNTER — Ambulatory Visit (INDEPENDENT_AMBULATORY_CARE_PROVIDER_SITE_OTHER): Payer: Medicare Other | Admitting: General Practice

## 2015-08-17 DIAGNOSIS — Z5181 Encounter for therapeutic drug level monitoring: Secondary | ICD-10-CM

## 2015-08-17 DIAGNOSIS — Z952 Presence of prosthetic heart valve: Secondary | ICD-10-CM

## 2015-08-17 DIAGNOSIS — Z954 Presence of other heart-valve replacement: Secondary | ICD-10-CM | POA: Diagnosis not present

## 2015-08-17 LAB — POCT INR: INR: 1.4

## 2015-08-17 NOTE — Progress Notes (Signed)
Pre visit review using our clinic review tool, if applicable. No additional management support is needed unless otherwise documented below in the visit note. 

## 2015-08-20 ENCOUNTER — Telehealth: Payer: Self-pay | Admitting: Family Medicine

## 2015-08-20 ENCOUNTER — Telehealth: Payer: Self-pay | Admitting: General Practice

## 2015-08-20 NOTE — Telephone Encounter (Signed)
Results of INR sent to Butch Penny, RN @ [facility] @ [phone]. (2nd fax)

## 2015-08-20 NOTE — Telephone Encounter (Signed)
Pt has protime on 08/17/15 and donna would like a results fax to her at [phone]

## 2015-08-24 ENCOUNTER — Telehealth: Payer: Self-pay | Admitting: General Practice

## 2015-08-24 NOTE — Telephone Encounter (Signed)
Marvin Wagner never received fax please refax

## 2015-08-24 NOTE — Telephone Encounter (Signed)
3rd Fax sent to Butch Penny, RN @ [facility].

## 2015-09-07 ENCOUNTER — Ambulatory Visit (INDEPENDENT_AMBULATORY_CARE_PROVIDER_SITE_OTHER): Payer: Medicare Other | Admitting: General Practice

## 2015-09-07 DIAGNOSIS — Z952 Presence of prosthetic heart valve: Secondary | ICD-10-CM

## 2015-09-07 DIAGNOSIS — Z5181 Encounter for therapeutic drug level monitoring: Secondary | ICD-10-CM

## 2015-09-07 DIAGNOSIS — Z954 Presence of other heart-valve replacement: Secondary | ICD-10-CM | POA: Diagnosis not present

## 2015-09-07 LAB — POCT INR: INR: 1.7

## 2015-09-25 ENCOUNTER — Other Ambulatory Visit: Payer: Self-pay | Admitting: Family Medicine

## 2015-09-28 NOTE — Telephone Encounter (Signed)
Rx refill sent to pharmacy. 

## 2015-10-12 ENCOUNTER — Ambulatory Visit: Payer: Medicare Other | Admitting: General Practice

## 2015-10-12 ENCOUNTER — Ambulatory Visit (INDEPENDENT_AMBULATORY_CARE_PROVIDER_SITE_OTHER): Payer: Medicare Other | Admitting: General Practice

## 2015-10-12 DIAGNOSIS — Z5181 Encounter for therapeutic drug level monitoring: Secondary | ICD-10-CM

## 2015-10-12 DIAGNOSIS — Z952 Presence of prosthetic heart valve: Secondary | ICD-10-CM

## 2015-10-12 DIAGNOSIS — I4892 Unspecified atrial flutter: Secondary | ICD-10-CM

## 2015-10-12 DIAGNOSIS — Z954 Presence of other heart-valve replacement: Secondary | ICD-10-CM | POA: Diagnosis not present

## 2015-10-12 LAB — POCT INR: INR: 2.1

## 2015-11-02 ENCOUNTER — Other Ambulatory Visit: Payer: Self-pay | Admitting: Cardiovascular Disease

## 2015-11-02 MED ORDER — METOPROLOL TARTRATE 25 MG PO TABS: 12.5000 mg | ORAL_TABLET | Freq: Every day | ORAL | 1 refills | Status: DC

## 2015-11-06 ENCOUNTER — Other Ambulatory Visit: Payer: Self-pay | Admitting: Family Medicine

## 2015-11-09 ENCOUNTER — Other Ambulatory Visit: Payer: Self-pay | Admitting: General Practice

## 2015-11-09 ENCOUNTER — Ambulatory Visit (INDEPENDENT_AMBULATORY_CARE_PROVIDER_SITE_OTHER): Payer: Medicare Other | Admitting: General Practice

## 2015-11-09 DIAGNOSIS — Z5181 Encounter for therapeutic drug level monitoring: Secondary | ICD-10-CM | POA: Diagnosis not present

## 2015-11-09 DIAGNOSIS — Z952 Presence of prosthetic heart valve: Secondary | ICD-10-CM

## 2015-11-09 LAB — POCT INR: INR: 1.9

## 2015-11-09 MED ORDER — WARFARIN SODIUM 5 MG PO TABS
ORAL_TABLET | ORAL | 3 refills | Status: DC
Start: 2015-11-09 — End: 2016-02-27

## 2015-12-01 ENCOUNTER — Ambulatory Visit: Payer: Medicare Other | Admitting: Family Medicine

## 2015-12-01 DIAGNOSIS — Z0289 Encounter for other administrative examinations: Secondary | ICD-10-CM

## 2015-12-07 ENCOUNTER — Ambulatory Visit (INDEPENDENT_AMBULATORY_CARE_PROVIDER_SITE_OTHER): Payer: Medicare Other | Admitting: General Practice

## 2015-12-07 DIAGNOSIS — Z5181 Encounter for therapeutic drug level monitoring: Secondary | ICD-10-CM

## 2015-12-07 DIAGNOSIS — Z952 Presence of prosthetic heart valve: Secondary | ICD-10-CM

## 2015-12-07 DIAGNOSIS — I4892 Unspecified atrial flutter: Secondary | ICD-10-CM

## 2015-12-07 LAB — POCT INR: INR: 2.1

## 2015-12-07 NOTE — Patient Instructions (Signed)
Pre visit review using our clinic review tool, if applicable. No additional management support is needed unless otherwise documented below in the visit note. 

## 2015-12-22 ENCOUNTER — Encounter: Payer: Medicare Other | Admitting: Family Medicine

## 2015-12-22 DIAGNOSIS — Z0289 Encounter for other administrative examinations: Secondary | ICD-10-CM

## 2015-12-25 ENCOUNTER — Other Ambulatory Visit: Payer: Self-pay | Admitting: Family Medicine

## 2016-01-04 ENCOUNTER — Ambulatory Visit (INDEPENDENT_AMBULATORY_CARE_PROVIDER_SITE_OTHER): Payer: Medicare Other | Admitting: General Practice

## 2016-01-04 DIAGNOSIS — Z5181 Encounter for therapeutic drug level monitoring: Secondary | ICD-10-CM

## 2016-01-04 DIAGNOSIS — Z952 Presence of prosthetic heart valve: Secondary | ICD-10-CM

## 2016-01-04 LAB — POCT INR: INR: 2.4

## 2016-01-04 NOTE — Patient Instructions (Signed)
Pre visit review using our clinic review tool, if applicable. No additional management support is needed unless otherwise documented below in the visit note. 

## 2016-02-06 ENCOUNTER — Emergency Department (HOSPITAL_COMMUNITY): Payer: Medicare Other

## 2016-02-06 ENCOUNTER — Encounter (HOSPITAL_COMMUNITY): Payer: Self-pay | Admitting: Emergency Medicine

## 2016-02-06 ENCOUNTER — Emergency Department (HOSPITAL_COMMUNITY)
Admission: EM | Admit: 2016-02-06 | Discharge: 2016-02-07 | Disposition: A | Discharge status: Home / Self Care | Payer: Medicare Other | Attending: Emergency Medicine | Admitting: Emergency Medicine

## 2016-02-06 DIAGNOSIS — Y9289 Other specified places as the place of occurrence of the external cause: Secondary | ICD-10-CM | POA: Diagnosis not present

## 2016-02-06 DIAGNOSIS — R410 Disorientation, unspecified: Secondary | ICD-10-CM | POA: Insufficient documentation

## 2016-02-06 DIAGNOSIS — W19XXXA Unspecified fall, initial encounter: Secondary | ICD-10-CM

## 2016-02-06 DIAGNOSIS — I10 Essential (primary) hypertension: Secondary | ICD-10-CM | POA: Insufficient documentation

## 2016-02-06 DIAGNOSIS — Z87891 Personal history of nicotine dependence: Secondary | ICD-10-CM | POA: Diagnosis not present

## 2016-02-06 DIAGNOSIS — Y999 Unspecified external cause status: Secondary | ICD-10-CM | POA: Diagnosis not present

## 2016-02-06 DIAGNOSIS — Z7901 Long term (current) use of anticoagulants: Secondary | ICD-10-CM | POA: Insufficient documentation

## 2016-02-06 DIAGNOSIS — I2510 Atherosclerotic heart disease of native coronary artery without angina pectoris: Secondary | ICD-10-CM | POA: Insufficient documentation

## 2016-02-06 DIAGNOSIS — Z043 Encounter for examination and observation following other accident: Secondary | ICD-10-CM | POA: Diagnosis not present

## 2016-02-06 DIAGNOSIS — Y939 Activity, unspecified: Secondary | ICD-10-CM | POA: Diagnosis not present

## 2016-02-06 DIAGNOSIS — Z955 Presence of coronary angioplasty implant and graft: Secondary | ICD-10-CM | POA: Insufficient documentation

## 2016-02-06 DIAGNOSIS — S0990XA Unspecified injury of head, initial encounter: Secondary | ICD-10-CM | POA: Diagnosis not present

## 2016-02-06 DIAGNOSIS — W1830XA Fall on same level, unspecified, initial encounter: Secondary | ICD-10-CM | POA: Diagnosis not present

## 2016-02-06 DIAGNOSIS — S199XXA Unspecified injury of neck, initial encounter: Secondary | ICD-10-CM | POA: Diagnosis not present

## 2016-02-06 DIAGNOSIS — R05 Cough: Secondary | ICD-10-CM | POA: Insufficient documentation

## 2016-02-06 DIAGNOSIS — R0989 Other specified symptoms and signs involving the circulatory and respiratory systems: Secondary | ICD-10-CM | POA: Diagnosis not present

## 2016-02-06 DIAGNOSIS — R402441 Other coma, without documented Glasgow coma scale score, or with partial score reported, in the field [EMT or ambulance]: Secondary | ICD-10-CM | POA: Diagnosis not present

## 2016-02-06 LAB — COMPREHENSIVE METABOLIC PANEL
ALBUMIN: 4 g/dL (ref 3.5–5.0)
ALK PHOS: 48 U/L (ref 38–126)
ALT: 30 U/L (ref 17–63)
AST: 46 U/L — ABNORMAL HIGH (ref 15–41)
Anion gap: 12 (ref 5–15)
BILIRUBIN TOTAL: 0.8 mg/dL (ref 0.3–1.2)
BUN: 28 mg/dL — ABNORMAL HIGH (ref 6–20)
CALCIUM: 9.1 mg/dL (ref 8.9–10.3)
CO2: 24 mmol/L (ref 22–32)
Chloride: 104 mmol/L (ref 101–111)
Creatinine, Ser: 1.18 mg/dL (ref 0.61–1.24)
GFR, EST NON AFRICAN AMERICAN: 55 mL/min — AB (ref >60)
Glucose, Bld: 95 mg/dL (ref 65–99)
POTASSIUM: 4 mmol/L (ref 3.5–5.1)
Sodium: 140 mmol/L (ref 135–145)
TOTAL PROTEIN: 6.7 g/dL (ref 6.5–8.1)

## 2016-02-06 LAB — CBG MONITORING, ED: GLUCOSE-CAPILLARY: 95 mg/dL (ref 65–99)

## 2016-02-06 LAB — CBC
HEMATOCRIT: 38.8 % — AB (ref 39.0–52.0)
HEMOGLOBIN: 13.2 g/dL (ref 13.0–17.0)
MCH: 33.2 pg (ref 26.0–34.0)
MCHC: 34 g/dL (ref 30.0–36.0)
MCV: 97.5 fL (ref 78.0–100.0)
Platelets: 87 10*3/uL — ABNORMAL LOW (ref 150–400)
RBC: 3.98 MIL/uL — AB (ref 4.22–5.81)
RDW: 13.2 % (ref 11.5–15.5)
WBC: 9.1 10*3/uL (ref 4.0–10.5)

## 2016-02-06 LAB — PROTIME-INR
INR: 2.39
PROTHROMBIN TIME: 26.5 s — AB (ref 11.4–15.2)

## 2016-02-06 MED ORDER — SODIUM CHLORIDE 0.9 % IV BOLUS (SEPSIS)
500.0000 mL | Freq: Once | INTRAVENOUS | Status: AC
  Administered 2016-02-07: 500 mL via INTRAVENOUS

## 2016-02-06 NOTE — ED Notes (Signed)
Checked CB[facility] 95, RN Anderson Malta informed

## 2016-02-06 NOTE — ED Notes (Signed)
Patient transported to CT 

## 2016-02-06 NOTE — ED Notes (Signed)
Pt states he rolled out of bed which sits 12" off the ground. Pt denies any pain or injuries. Pt is currently at is baseline per his daughter.  Pt's daughter states the pt rolled out of bed this morning and she got him off the floor. Daughter states she went to visit her father again this evening and he again rolled off the bed onto the floor. Daughter states he is currently at his baseline, pt has dementia.

## 2016-02-06 NOTE — ED Triage Notes (Signed)
Pt was found on the floor by his bed by his daughter at [facility], unknown time on floor.  At first pt was altered and confused however by the time he arrived at the hospital he was at baseline.  Pt does have a hx of dementia and afib.  No obvious signs of injuries however he is on blood thinners.  Pt does report he has been developing a cough and chest congestion.

## 2016-02-06 NOTE — ED Provider Notes (Signed)
[facility] DEPT Provider Note   CSN: [id number] Arrival date & time: 02/06/16  2120 By signing my name below, I, Georgette Shell, attest that this documentation has been prepared under the direction and in the presence of Ezequiel Essex, MD. Electronically Signed: Georgette Shell, ED Scribe. 02/06/16. 11:44 PM.  History   Chief Complaint Chief Complaint  Patient presents with  . Fall   The history is provided by the patient and a relative. No language interpreter was used.   HPI Comments: Marvin Wagner is a 83 y.o. male with h/o CAD, HTN, A-fib, and dementia, who presents to the Emergency Department from [facility] for medical evaluation s/p unwitnessed mechanical fall earlier today. Daughter reports that she came to see pt at his home and saw him on the floor. She notes that the caregiver at his facility stated that he also fell once prior today. Pt is unable to recall any of his falls and denies any pain at this time. Per daughter, pt has had increased confusion the past couple of days worse than at baseline. Pt endorses a productive cough that began 2-3 weeks ago with associated chest congestion. Pt is ambulatory with a cane. Daughter reports that pt has history of aortic valve replacement and is on Coumadin. Pt has been compliant with his medications. Pt is not a smoker. He denies h/o asthma, COPD, or CHF. Pt further denies fever, abdominal pain, hip pain, nausea, vomiting, chest pain, shortness of breath, or any other associated symptoms.   Past Medical History:  Diagnosis Date  . Aortic insufficiency and aortic stenosis   . Aortic stenosis    SEVERE  . CAD (coronary artery disease)   . Heart murmur   . Hyperlipidemia   . Hypertension   . Mild memory disturbance     Patient Active Problem List   Diagnosis Date Noted  . Dementia 03/04/2015  . Hypotension 12/03/2014  . Weight loss, unintentional 10/17/2013  . History of elevated PSA 04/25/2013  . Encounter for therapeutic drug  monitoring 02/26/2013  . Long term current use of anticoagulant therapy 12/19/2012  . Atrial flutter ([place]) 12/14/2012  . S/P AVR 06/13/2012  . Other vitamin B12 deficiency anemia 02/29/2012  . Memory disorder 02/14/2012  . Hx of CABG 07/01/2010  . Hyperlipidemia 07/01/2010  . Benign hypertensive heart disease without heart failure 07/01/2010    Past Surgical History:  Procedure Laterality Date  . AORTIC VALVE REPLACEMENT  12/17/08  . CARDIAC CATHETERIZATION    . CORONARY ANGIOPLASTY     NORMAL LEFT VENTRICULAR SIZEAND CONTRACTILITY WITH NORMAL SYSTOLIC FUNCTION. EF 65%  . TONSILLECTOMY         Home Medications    Prior to Admission medications   Medication Sig Start Date End Date Taking? Authorizing Provider  acetaminophen (TYLENOL) 500 MG tablet Take 500 mg by mouth every 6 (six) hours as needed (for pain).     Historical Provider, MD  atorvastatin (LIPITOR) 10 MG tablet TAKE   1 TABLET (10MG ) BY MOUTH   DAILY 09/28/15   Eulas Post, MD  calcium carbonate (OS-CAL) 600 MG TABS Take 600 mg by mouth 2 (two) times daily with a meal.      Historical Provider, MD  Cholecalciferol (VITAMIN D) 2000 UNITS tablet Take 2,000 Units by mouth daily.      Historical Provider, MD  donepezil (ARICEPT) 10 MG tablet TAKE 1 TABLET (10 MG TOTAL) BY MOUTH AT BEDTIME. 12/28/15   Eulas Post, MD  memantine [facility])  10 MG tablet TAKE   1 TABLET (10MG ) BY MOUTH TWICE DAILY 09/28/15   Eulas Post, MD  metoprolol tartrate (LOPRESSOR) 25 MG tablet Take 0.5 tablets (12.5 mg total) by mouth daily. 11/02/15   Mihai Croitoru, MD  Multiple Vitamin (MULTIVITAMIN) tablet Take 1 tablet by mouth daily.      Historical Provider, MD  vitamin C (ASCORBIC ACID) 500 MG tablet Take 500 mg by mouth daily.      Historical Provider, MD  warfarin (COUMADIN) 5 MG tablet Take as directed by anticoagulation clinic 11/09/15   Eulas Post, MD    Family History Family History  Problem Relation Age of Onset    . Ankylosing spondylitis Brother     Social History Social History  Substance Use Topics  . Smoking status: Former Smoker    Quit date: 06/30/2006  . Smokeless tobacco: Not on file  . Alcohol use No     Allergies   Penicillins   Review of Systems Review of Systems 10 Systems reviewed and all are negative for acute change except as noted in the HPI. Physical Exam Updated Vital Signs BP (!) 117/51   Pulse 64   Temp 98.8 F (37.1 C) (Oral)   Resp 14   SpO2 96%   Physical Exam  Constitutional: He is oriented to person, place, and time. He appears well-developed and well-nourished. No distress.  Somnolent but arousable to voice.  HENT:  Head: Normocephalic and atraumatic.  Mouth/Throat: Oropharynx is clear and moist. Mucous membranes are dry. No oropharyngeal exudate.  Eyes: Conjunctivae and EOM are normal. Pupils are equal, round, and reactive to light.  Neck: Normal range of motion. Neck supple.  No meningismus.  Cardiovascular: Normal rate, regular rhythm, normal heart sounds and intact distal pulses.   No murmur heard. Mechanical click on aortic valve.  Pulmonary/Chest: Effort normal. No respiratory distress. He has decreased breath sounds in the left upper field, the left middle field and the left lower field. He has rhonchi.  Decreased breath sounds on the left with scattered rhonchi.   Abdominal: Soft. There is no tenderness. There is no rebound and no guarding.  Musculoskeletal: Normal range of motion. He exhibits no edema or tenderness.  Full ROM of hips without pain. No C-spine tenderness.  Neurological: He is alert and oriented to person, place, and time. No cranial nerve deficit. He exhibits normal muscle tone. Coordination normal.   5/5 strength throughout. CN 2-12 intact.Equal grip strength.   Skin: Skin is warm.  Psychiatric: He has a normal mood and affect. His behavior is normal.  Nursing note and vitals reviewed.    ED Treatments / Results   DIAGNOSTIC STUDIES: Oxygen Saturation is 96% on RA, adequate by my interpretation.    COORDINATION OF CARE: 11:14 PM Discussed treatment plan with pt at bedside which includes CXR and urinalysis and pt agreed to plan.  Labs (all labs ordered are listed, but only abnormal results are displayed) Labs Reviewed  CBC - Abnormal; Notable for the following:       Result Value   RBC 3.98 (*)    HCT 38.8 (*)    All other components within normal limits  PROTIME-INR - Abnormal; Notable for the following:    Prothrombin Time 26.5 (*)    All other components within normal limits  COMPREHENSIVE METABOLIC PANEL  CBG MONITORING, ED    EKG  EKG Interpretation  Date/Time:  Saturday February 06 2016 22:04:40 EST Ventricular Rate:  71 PR Interval:  QRS Duration: 97 QT Interval:  348 QTC Calculation: 379 R Axis:   -71 Text Interpretation:  Normal sinus rhythm Left anterior fascicular block Borderline T wave abnormalities No significant change was found Confirmed by Wyvonnia Dusky  MD, Karelyn Brisby [phone]) on 02/06/2016 11:03:17 PM       Radiology Dg Chest 2 View  Result Date: 02/07/2016 CLINICAL DATA:  Fall with cough and congestion EXAM: CHEST  2 VIEW COMPARISON:  05/05/2009 FINDINGS: Post sternotomy changes and valvular replacement. The lungs are hyperinflated. No acute infiltrate or effusion. No pneumothorax. Cardiomediastinal silhouette stable with atherosclerosis. Possible right second rib deformity. IMPRESSION: 1. No acute infiltrate effusion or pneumothorax. 2. Questionable rib Electronically Signed   By: Donavan Foil M.D.   On: 02/07/2016 00:59   Ct Head Wo Contrast  Result Date: 02/07/2016 CLINICAL DATA:  Unwitnessed fall from bed today.  Anticoagulated. EXAM: CT HEAD WITHOUT CONTRAST CT CERVICAL SPINE WITHOUT CONTRAST TECHNIQUE: Multidetector CT imaging of the head and cervical spine was performed following the standard protocol without intravenous contrast. Multiplanar CT image reconstructions  of the cervical spine were also generated. COMPARISON:  None. FINDINGS: CT HEAD FINDINGS Brain: There is no intracranial hemorrhage, mass or evidence of acute infarction. There is moderate generalized atrophy. There is moderate chronic microvascular ischemic change. There is no significant extra-axial fluid collection. No acute intracranial findings are evident. Vascular: No hyperdense vessel or unexpected calcification. Skull: Normal. Negative for fracture or focal lesion. Sinuses/Orbits: No acute finding. Other: None. CT CERVICAL SPINE FINDINGS Alignment: Normal. Skull base and vertebrae: No acute fracture. No primary bone lesion or focal pathologic process. Soft tissues and spinal canal: No prevertebral fluid or swelling. No visible canal hematoma. Disc levels: Moderate degenerative cervical disc disease, greatest at C5-6 and C6-7. The facet articulations are intact and well preserved. Upper chest: Negative. Other: None IMPRESSION: 1. No acute intracranial findings. There is moderate generalized atrophy and chronic appearing white matter hypodensities which likely represent small vessel ischemic disease. 2. Negative for acute cervical spine fracture Electronically Signed   By: Andreas Newport M.D.   On: 02/07/2016 00:07   Ct Cervical Spine Wo Contrast  Result Date: 02/07/2016 CLINICAL DATA:  Unwitnessed fall from bed today.  Anticoagulated. EXAM: CT HEAD WITHOUT CONTRAST CT CERVICAL SPINE WITHOUT CONTRAST TECHNIQUE: Multidetector CT imaging of the head and cervical spine was performed following the standard protocol without intravenous contrast. Multiplanar CT image reconstructions of the cervical spine were also generated. COMPARISON:  None. FINDINGS: CT HEAD FINDINGS Brain: There is no intracranial hemorrhage, mass or evidence of acute infarction. There is moderate generalized atrophy. There is moderate chronic microvascular ischemic change. There is no significant extra-axial fluid collection. No acute  intracranial findings are evident. Vascular: No hyperdense vessel or unexpected calcification. Skull: Normal. Negative for fracture or focal lesion. Sinuses/Orbits: No acute finding. Other: None. CT CERVICAL SPINE FINDINGS Alignment: Normal. Skull base and vertebrae: No acute fracture. No primary bone lesion or focal pathologic process. Soft tissues and spinal canal: No prevertebral fluid or swelling. No visible canal hematoma. Disc levels: Moderate degenerative cervical disc disease, greatest at C5-6 and C6-7. The facet articulations are intact and well preserved. Upper chest: Negative. Other: None IMPRESSION: 1. No acute intracranial findings. There is moderate generalized atrophy and chronic appearing white matter hypodensities which likely represent small vessel ischemic disease. 2. Negative for acute cervical spine fracture Electronically Signed   By: Andreas Newport M.D.   On: 02/07/2016 00:07    Procedures Procedures (including critical care  time)  Medications Ordered in ED Medications - No data to display   Initial Impression / Assessment and Plan / ED Course  I have reviewed the triage vital signs and the nursing notes.  Pertinent labs & imaging results that were available during my care of the patient were reviewed by me and considered in my medical decision making (see chart for details).  Clinical Course   Patient from independent living after falling twice today and being found on the ground. He does not recall falling and denies any dizziness. He has dementia at baseline history is unreliable. Daughter feels he is more confused than usual. He has a moist cough. Sleepy but arousable, oriented and answers questions appropriately.  CT obtained given his Coumadin use history and is negative. EKG unchanged. Labs reassuring.  Chest x-ray shows no pneumonia. Patient still with rhonchi in the left side. We'll give nebulizer. He is ambulatory without desaturation. He is oriented 3. INR  is therapeutic.UA negative. Breath sounds improved on recheck.   Daughter feels patient is at his baseline. He denies any pain or shortness of breath.  Daughter is comfortable with him returning to facility. Return precautions discussed.  Final Clinical Impressions(s) / ED Diagnoses   Final diagnoses:  Fall, initial encounter    New Prescriptions New Prescriptions   No medications on file   I personally performed the services described in this documentation, which was scribed in my presence. The recorded information has been reviewed and is accurate.    Ezequiel Essex, MD 02/07/16 [phone]

## 2016-02-07 ENCOUNTER — Emergency Department (HOSPITAL_COMMUNITY): Payer: Medicare Other

## 2016-02-07 DIAGNOSIS — R410 Disorientation, unspecified: Secondary | ICD-10-CM | POA: Diagnosis not present

## 2016-02-07 DIAGNOSIS — R05 Cough: Secondary | ICD-10-CM | POA: Diagnosis not present

## 2016-02-07 LAB — URINALYSIS, ROUTINE W REFLEX MICROSCOPIC
Bacteria, UA: NONE SEEN
Bilirubin Urine: NEGATIVE
Glucose, UA: NEGATIVE mg/dL
Hgb urine dipstick: NEGATIVE
KETONES UR: 5 mg/dL — AB
LEUKOCYTES UA: NEGATIVE
Nitrite: NEGATIVE
PROTEIN: 30 mg/dL — AB
SQUAMOUS EPITHELIAL / LPF: NONE SEEN
Specific Gravity, Urine: 1.024 (ref 1.005–1.030)
pH: 5 (ref 5.0–8.0)

## 2016-02-07 MED ORDER — IPRATROPIUM-ALBUTEROL 0.5-2.5 (3) MG/3ML IN SOLN
3.0000 mL | Freq: Once | RESPIRATORY_TRACT | Status: AC
  Administered 2016-02-07: 3 mL via RESPIRATORY_TRACT
  Filled 2016-02-07: qty 3

## 2016-02-07 NOTE — ED Notes (Signed)
Pt ambulated from A5 around nurses station and back to A5. Pt O2 stats remain at 93% to 94%, pulse is 100 to 101. Pt states he is weak while ambulating.

## 2016-02-07 NOTE — Discharge Instructions (Signed)
Your testing is negative for serious injury. Followup with your doctor. Return to the ED if you develop new or worsening symtpoms.

## 2016-02-08 ENCOUNTER — Ambulatory Visit (INDEPENDENT_AMBULATORY_CARE_PROVIDER_SITE_OTHER): Payer: Medicare Other | Admitting: General Practice

## 2016-02-08 DIAGNOSIS — Z952 Presence of prosthetic heart valve: Secondary | ICD-10-CM

## 2016-02-08 DIAGNOSIS — Z5181 Encounter for therapeutic drug level monitoring: Secondary | ICD-10-CM

## 2016-02-08 LAB — POCT INR: INR: 2.7

## 2016-02-08 NOTE — Patient Instructions (Signed)
Pre visit review using our clinic review tool, if applicable. No additional management support is needed unless otherwise documented below in the visit note. 

## 2016-02-12 ENCOUNTER — Telehealth: Payer: Self-pay | Admitting: Family Medicine

## 2016-02-12 NOTE — Telephone Encounter (Signed)
[facility] need a copy of the pts INR that was done on 02/08/16 to be faxed to [phone].

## 2016-02-15 ENCOUNTER — Other Ambulatory Visit: Payer: Self-pay | Admitting: Family Medicine

## 2016-02-21 ENCOUNTER — Emergency Department (HOSPITAL_COMMUNITY)
Admission: EM | Admit: 2016-02-21 | Discharge: 2016-02-21 | Disposition: A | Discharge status: Home / Self Care | Payer: Medicare Other | Attending: Emergency Medicine | Admitting: Emergency Medicine

## 2016-02-21 ENCOUNTER — Encounter (HOSPITAL_COMMUNITY): Payer: Self-pay | Admitting: Emergency Medicine

## 2016-02-21 DIAGNOSIS — T45515A Adverse effect of anticoagulants, initial encounter: Secondary | ICD-10-CM

## 2016-02-21 DIAGNOSIS — Z87891 Personal history of nicotine dependence: Secondary | ICD-10-CM | POA: Insufficient documentation

## 2016-02-21 DIAGNOSIS — N3001 Acute cystitis with hematuria: Secondary | ICD-10-CM | POA: Insufficient documentation

## 2016-02-21 DIAGNOSIS — I10 Essential (primary) hypertension: Secondary | ICD-10-CM | POA: Insufficient documentation

## 2016-02-21 DIAGNOSIS — D688 Other specified coagulation defects: Secondary | ICD-10-CM | POA: Insufficient documentation

## 2016-02-21 DIAGNOSIS — Z951 Presence of aortocoronary bypass graft: Secondary | ICD-10-CM | POA: Insufficient documentation

## 2016-02-21 DIAGNOSIS — Z7901 Long term (current) use of anticoagulants: Secondary | ICD-10-CM | POA: Insufficient documentation

## 2016-02-21 DIAGNOSIS — D6832 Hemorrhagic disorder due to extrinsic circulating anticoagulants: Secondary | ICD-10-CM

## 2016-02-21 DIAGNOSIS — F4489 Other dissociative and conversion disorders: Secondary | ICD-10-CM | POA: Diagnosis not present

## 2016-02-21 DIAGNOSIS — I6789 Other cerebrovascular disease: Secondary | ICD-10-CM | POA: Diagnosis not present

## 2016-02-21 DIAGNOSIS — I2510 Atherosclerotic heart disease of native coronary artery without angina pectoris: Secondary | ICD-10-CM | POA: Insufficient documentation

## 2016-02-21 LAB — URINALYSIS, ROUTINE W REFLEX MICROSCOPIC
BACTERIA UA: NONE SEEN
Bilirubin Urine: NEGATIVE
Glucose, UA: NEGATIVE mg/dL
KETONES UR: 80 mg/dL — AB
Leukocytes, UA: NEGATIVE
Nitrite: NEGATIVE
PH: 5 (ref 5.0–8.0)
PROTEIN: 30 mg/dL — AB
SQUAMOUS EPITHELIAL / LPF: NONE SEEN
Specific Gravity, Urine: 1.025 (ref 1.005–1.030)

## 2016-02-21 LAB — CBC
HEMATOCRIT: 38 % — AB (ref 39.0–52.0)
Hemoglobin: 13.4 g/dL (ref 13.0–17.0)
MCH: 32.5 pg (ref 26.0–34.0)
MCHC: 35.3 g/dL (ref 30.0–36.0)
MCV: 92.2 fL (ref 78.0–100.0)
Platelets: 239 10*3/uL (ref 150–400)
RBC: 4.12 MIL/uL — ABNORMAL LOW (ref 4.22–5.81)
RDW: 12.5 % (ref 11.5–15.5)
WBC: 15.9 10*3/uL — ABNORMAL HIGH (ref 4.0–10.5)

## 2016-02-21 LAB — BASIC METABOLIC PANEL
Anion gap: 9 (ref 5–15)
BUN: 23 mg/dL — AB (ref 6–20)
CALCIUM: 8.6 mg/dL — AB (ref 8.9–10.3)
CO2: 27 mmol/L (ref 22–32)
Chloride: 104 mmol/L (ref 101–111)
Creatinine, Ser: 0.94 mg/dL (ref 0.61–1.24)
GFR calc Af Amer: >60 mL/min (ref >60)
GFR calc non Af Amer: >60 mL/min (ref >60)
GLUCOSE: 109 mg/dL — AB (ref 65–99)
POTASSIUM: 4.2 mmol/L (ref 3.5–5.1)
Sodium: 140 mmol/L (ref 135–145)

## 2016-02-21 LAB — PROTIME-INR
INR: 6.45
Prothrombin Time: 58.7 seconds — ABNORMAL HIGH (ref 11.4–15.2)

## 2016-02-21 MED ORDER — CEFTRIAXONE SODIUM 1 G IJ SOLR
1.0000 g | Freq: Once | INTRAMUSCULAR | Status: AC
  Administered 2016-02-21: 1 g via INTRAVENOUS
  Filled 2016-02-21: qty 10

## 2016-02-21 MED ORDER — CEPHALEXIN 500 MG PO CAPS: 500.0000 mg | ORAL_CAPSULE | Freq: Three times a day (TID) | ORAL | 0 refills | Status: DC

## 2016-02-21 NOTE — ED Notes (Signed)
INR 6.45

## 2016-02-21 NOTE — ED Triage Notes (Signed)
Pt sent by [facility] for some increased confusion. Pt's home health RN reported that patient with new urinary incontinence. Pt with hx of dementia. Pt with chronic back pain.

## 2016-02-21 NOTE — ED Notes (Signed)
PT GIVEN A URINAL TO COLLECT A URINE SAMPLE.

## 2016-02-21 NOTE — ED Notes (Signed)
PT DISCHARGED. INSTRUCTIONS AND PRESCRIPTION GIVEN. AAOX2. PT IN NO APPARENT DISTRESS OR PAIN. THE OPPORTUNITY TO ASK QUESTIONS WAS PROVIDED.

## 2016-02-21 NOTE — Discharge Instructions (Addendum)
It was our pleasure to provide your ER care today - we hope that you feel better.  The lab work shows a possible urine infection - take keflex (antibiotic) as prescribed.  Drink plenty of fluids.  Your blood is overly thin on the coumadin (INR 6.45) - hold your coumadin dose today and tomorrow - contact your doctor tomorrow AM to discuss the INR, when to re-start your coumadin, and subsequent dosing of your coumadin, and recheck of INR.  Also, given dementia and fall risk, discuss with your doctor whether they feel benefit of continued coumadin therapy outweigh the risk.  We have made referral for home health nurse and social worker - they should be able to work with your and your doctors office should you wish to pursue extended care facility placement in the future.   Return to ER if worse, new symptoms, fevers, new or severe pain, other concern.

## 2016-02-21 NOTE — ED Provider Notes (Addendum)
[facility] DEPT Provider Note   CSN: [id number] Arrival date & time: 02/21/16  1039     History   Chief Complaint Chief Complaint  Patient presents with  . confusion  . Urinary Incontinence    HPI Marvin Wagner is a 83 y.o. male.  Patient w whx dementia, cad, htn, presents from ecf, pt noted to be more confused and with urinary incontinence today. Patient limited historian, dementia - level 5 caveat. Patient denies any c/o, states he feels fine. No headache. Denies chest pain or sob. No abd pain. Denies nv. Denies dysuria or gu c/o. Denies fever or chills. Denies trauma, fall or any type of pain.    The history is provided by the patient and the EMS personnel. The history is limited by the condition of the patient.    Past Medical History:  Diagnosis Date  . Aortic insufficiency and aortic stenosis   . Aortic stenosis    SEVERE  . CAD (coronary artery disease)   . Heart murmur   . Hyperlipidemia   . Hypertension   . Mild memory disturbance     Patient Active Problem List   Diagnosis Date Noted  . Dementia 03/04/2015  . Hypotension 12/03/2014  . Weight loss, unintentional 10/17/2013  . History of elevated PSA 04/25/2013  . Encounter for therapeutic drug monitoring 02/26/2013  . Long term current use of anticoagulant therapy 12/19/2012  . Atrial flutter ([place]) 12/14/2012  . S/P AVR 06/13/2012  . Other vitamin B12 deficiency anemia 02/29/2012  . Memory disorder 02/14/2012  . Hx of CABG 07/01/2010  . Hyperlipidemia 07/01/2010  . Benign hypertensive heart disease without heart failure 07/01/2010    Past Surgical History:  Procedure Laterality Date  . AORTIC VALVE REPLACEMENT  12/17/08  . CARDIAC CATHETERIZATION    . CORONARY ANGIOPLASTY     NORMAL LEFT VENTRICULAR SIZEAND CONTRACTILITY WITH NORMAL SYSTOLIC FUNCTION. EF 65%  . TONSILLECTOMY         Home Medications    Prior to Admission medications   Medication Sig Start Date End Date Taking?  Authorizing Provider  acetaminophen (TYLENOL) 500 MG tablet Take 500 mg by mouth every 6 (six) hours as needed (for pain).     Historical Provider, MD  atorvastatin (LIPITOR) 10 MG tablet TAKE   1 TABLET (10MG ) BY MOUTH   DAILY 02/15/16   Eulas Post, MD  calcium carbonate (OS-CAL) 600 MG TABS Take 600 mg by mouth 2 (two) times daily with a meal.      Historical Provider, MD  Cholecalciferol (VITAMIN D) 2000 UNITS tablet Take 2,000 Units by mouth daily.      Historical Provider, MD  donepezil (ARICEPT) 10 MG tablet TAKE 1 TABLET (10 MG TOTAL) BY MOUTH AT BEDTIME. 12/28/15   Eulas Post, MD  memantine (NAMENDA) 10 MG tablet TAKE   1 TABLET (10MG ) BY MOUTH TWICE DAILY 09/28/15   Eulas Post, MD  metoprolol tartrate (LOPRESSOR) 25 MG tablet Take 0.5 tablets (12.5 mg total) by mouth daily. 11/02/15   Mihai Croitoru, MD  Multiple Vitamin (MULTIVITAMIN) tablet Take 1 tablet by mouth daily.      Historical Provider, MD  vitamin C (ASCORBIC ACID) 500 MG tablet Take 500 mg by mouth daily.      Historical Provider, MD  warfarin (COUMADIN) 5 MG tablet Take as directed by anticoagulation clinic 11/09/15   Eulas Post, MD    Family History Family History  Problem Relation Age of Onset  .  Ankylosing spondylitis Brother     Social History Social History  Substance Use Topics  . Smoking status: Former Smoker    Quit date: 06/30/2006  . Smokeless tobacco: Not on file  . Alcohol use No     Allergies   Penicillins   Review of Systems Review of Systems  Constitutional: Negative for fever.  HENT: Negative for sore throat.   Eyes: Negative for redness.  Respiratory: Negative for shortness of breath.   Cardiovascular: Negative for chest pain.  Gastrointestinal: Negative for abdominal pain.  Genitourinary: Negative for flank pain.  Musculoskeletal: Negative for back pain and neck pain.  Skin: Negative for rash.  Neurological: Negative for headaches.  Hematological: Does not  bruise/bleed easily.  Psychiatric/Behavioral: Negative for confusion.     Physical Exam Updated Vital Signs BP 136/73 (BP Location: Left Arm)   Pulse 61   Temp 97.3 F (36.3 C) (Oral)   Resp 18   SpO2 94%   Physical Exam  Constitutional: He appears well-developed and well-nourished. No distress.  HENT:  Head: Atraumatic.  Mouth/Throat: Oropharynx is clear and moist.  Eyes: Conjunctivae are normal. Pupils are equal, round, and reactive to light.  Neck: Neck supple. No tracheal deviation present.  No bruits.   Cardiovascular: Normal rate and intact distal pulses.   Murmur heard. Pulmonary/Chest: Effort normal and breath sounds normal. No accessory muscle usage. No respiratory distress.  Abdominal: Soft. Bowel sounds are normal. He exhibits no distension. There is no tenderness.  Genitourinary:  Genitourinary Comments: No cva tenderness  Musculoskeletal: He exhibits no edema.  Neurological: He is alert.  Speech clear/fluent. Motor intact bil, stre 5/5. sens grossly intact.   Skin: Skin is warm and dry. He is not diaphoretic.  Psychiatric: He has a normal mood and affect.  Nursing note and vitals reviewed.    ED Treatments / Results  Labs (all labs ordered are listed, but only abnormal results are displayed) Results for orders placed or performed during the hospital encounter of [id number]  Basic metabolic panel  Result Value Ref Range   Sodium 140 135 - 145 mmol/L   Potassium 4.2 3.5 - 5.1 mmol/L   Chloride 104 101 - 111 mmol/L   CO2 27 22 - 32 mmol/L   Glucose, Bld 109 (H) 65 - 99 mg/dL   BUN 23 (H) 6 - 20 mg/dL   Creatinine, Ser 0.94 0.61 - 1.24 mg/dL   Calcium 8.6 (L) 8.9 - 10.3 mg/dL   GFR calc non Af Amer >60 >60 mL/min   GFR calc Af Amer >60 >60 mL/min   Anion gap 9 5 - 15  CBC  Result Value Ref Range   WBC 15.9 (H) 4.0 - 10.5 K/uL   RBC 4.12 (L) 4.22 - 5.81 MIL/uL   Hemoglobin 13.4 13.0 - 17.0 g/dL   HCT 38.0 (L) 39.0 - 52.0 %   MCV 92.2 78.0 - 100.0 fL    MCH 32.5 26.0 - 34.0 pg   MCHC 35.3 30.0 - 36.0 g/dL   RDW 12.5 11.5 - 15.5 %   Platelets 239 150 - 400 K/uL  Protime-INR  Result Value Ref Range   Prothrombin Time 58.7 (H) 11.4 - 15.2 seconds   INR 6.45 (HH)   Urinalysis, Routine w reflex microscopic  Result Value Ref Range   Color, Urine AMBER (A) YELLOW   APPearance HAZY (A) CLEAR   Specific Gravity, Urine 1.025 1.005 - 1.030   pH 5.0 5.0 - 8.0   Glucose, UA NEGATIVE  NEGATIVE mg/dL   Hgb urine dipstick LARGE (A) NEGATIVE   Bilirubin Urine NEGATIVE NEGATIVE   Ketones, ur 80 (A) NEGATIVE mg/dL   Protein, ur 30 (A) NEGATIVE mg/dL   Nitrite NEGATIVE NEGATIVE   Leukocytes, UA NEGATIVE NEGATIVE   RBC / HPF TOO NUMEROUS TO COUNT 0 - 5 RBC/hpf   WBC, UA 6-30 0 - 5 WBC/hpf   Bacteria, UA NONE SEEN NONE SEEN   Squamous Epithelial / LPF NONE SEEN NONE SEEN   Mucous PRESENT     EKG  EKG Interpretation None       Radiology No results found.  Procedures Procedures (including critical care time)  Medications Ordered in ED Medications - No data to display   Initial Impression / Assessment and Plan / ED Course  I have reviewed the triage vital signs and the nursing notes.  Pertinent labs & imaging results that were available during my care of the patient were reviewed by me and considered in my medical decision making (see chart for details).  Reviewed nursing notes and prior charts for additional history.   Labs sent.  ?UA on labs, with many rbcs, and 6-30 wbc.   INR high, will have coumadin held today and tomorrow - pt/ecf to contact pcp tomororw AM to discuss INR, and subsequent coumadin dosing.  Recheck pt, remains awake and alert. Denies c/o, no pain. No faintness or dizziness.  Daughter in ED - discussed pt - she indicates currently in independent living, and that patient and spouse do not want a higher level of care, and are each others POA.  She inquired about options.  Patient/spouse do not want ECF.   Discussed that we can make home health referral for home health aide, rn and sw - and that possibly home health agency and pcp can work together should pt/spouse be agreeable to ECF placement in future.     Final Clinical Impressions(s) / ED Diagnoses   Final diagnoses:  None    New Prescriptions New Prescriptions   No medications on file     Lajean Saver, MD 02/21/16 [place], MD 02/21/16 [phone]

## 2016-02-22 ENCOUNTER — Telehealth: Payer: Self-pay | Admitting: Family Medicine

## 2016-02-22 ENCOUNTER — Ambulatory Visit (INDEPENDENT_AMBULATORY_CARE_PROVIDER_SITE_OTHER): Payer: Medicare Other | Admitting: General Practice

## 2016-02-22 NOTE — Telephone Encounter (Signed)
Pt INR is out of whack and they have stopped his coumadin and would like to know what to do.

## 2016-02-22 NOTE — Patient Instructions (Signed)
Pre visit review using our clinic review tool, if applicable. No additional management support is needed unless otherwise documented below in the visit note. INR is high today.  Pt dc'd from ER yesterday, 1/21 with a UTI.  INR was 6.45.  Coumadin is being held through Thursday.  Will re-check INR on Friday at the [place] office.

## 2016-02-24 ENCOUNTER — Inpatient Hospital Stay (HOSPITAL_COMMUNITY)
Admission: EM | Admit: 2016-02-24 | Discharge: 2016-02-27 | DRG: 605 | Disposition: A | Discharge status: Skilled Nursing Facility | Payer: Medicare Other | Attending: Internal Medicine | Admitting: Internal Medicine

## 2016-02-24 ENCOUNTER — Encounter (HOSPITAL_COMMUNITY): Payer: Self-pay | Admitting: *Deleted

## 2016-02-24 ENCOUNTER — Emergency Department (HOSPITAL_COMMUNITY): Payer: Medicare Other

## 2016-02-24 ENCOUNTER — Ambulatory Visit: Payer: Medicare Other | Admitting: Family Medicine

## 2016-02-24 DIAGNOSIS — G309 Alzheimer's disease, unspecified: Secondary | ICD-10-CM | POA: Diagnosis not present

## 2016-02-24 DIAGNOSIS — E876 Hypokalemia: Secondary | ICD-10-CM | POA: Diagnosis present

## 2016-02-24 DIAGNOSIS — Z7901 Long term (current) use of anticoagulants: Secondary | ICD-10-CM | POA: Diagnosis not present

## 2016-02-24 DIAGNOSIS — R296 Repeated falls: Secondary | ICD-10-CM | POA: Diagnosis not present

## 2016-02-24 DIAGNOSIS — Z952 Presence of prosthetic heart valve: Secondary | ICD-10-CM

## 2016-02-24 DIAGNOSIS — Z87891 Personal history of nicotine dependence: Secondary | ICD-10-CM

## 2016-02-24 DIAGNOSIS — J90 Pleural effusion, not elsewhere classified: Secondary | ICD-10-CM | POA: Diagnosis not present

## 2016-02-24 DIAGNOSIS — S0990XA Unspecified injury of head, initial encounter: Secondary | ICD-10-CM | POA: Diagnosis not present

## 2016-02-24 DIAGNOSIS — Z951 Presence of aortocoronary bypass graft: Secondary | ICD-10-CM

## 2016-02-24 DIAGNOSIS — R630 Anorexia: Secondary | ICD-10-CM | POA: Diagnosis present

## 2016-02-24 DIAGNOSIS — S20212A Contusion of left front wall of thorax, initial encounter: Secondary | ICD-10-CM | POA: Diagnosis not present

## 2016-02-24 DIAGNOSIS — E785 Hyperlipidemia, unspecified: Secondary | ICD-10-CM | POA: Diagnosis present

## 2016-02-24 DIAGNOSIS — S3991XA Unspecified injury of abdomen, initial encounter: Secondary | ICD-10-CM | POA: Diagnosis not present

## 2016-02-24 DIAGNOSIS — F0390 Unspecified dementia without behavioral disturbance: Secondary | ICD-10-CM | POA: Diagnosis present

## 2016-02-24 DIAGNOSIS — R011 Cardiac murmur, unspecified: Secondary | ICD-10-CM | POA: Diagnosis present

## 2016-02-24 DIAGNOSIS — R531 Weakness: Secondary | ICD-10-CM | POA: Diagnosis present

## 2016-02-24 DIAGNOSIS — R278 Other lack of coordination: Secondary | ICD-10-CM | POA: Diagnosis not present

## 2016-02-24 DIAGNOSIS — Z9861 Coronary angioplasty status: Secondary | ICD-10-CM | POA: Diagnosis not present

## 2016-02-24 DIAGNOSIS — Z880 Allergy status to penicillin: Secondary | ICD-10-CM | POA: Diagnosis not present

## 2016-02-24 DIAGNOSIS — I10 Essential (primary) hypertension: Secondary | ICD-10-CM | POA: Diagnosis present

## 2016-02-24 DIAGNOSIS — I4891 Unspecified atrial fibrillation: Secondary | ICD-10-CM | POA: Diagnosis present

## 2016-02-24 DIAGNOSIS — R319 Hematuria, unspecified: Secondary | ICD-10-CM | POA: Diagnosis not present

## 2016-02-24 DIAGNOSIS — R791 Abnormal coagulation profile: Secondary | ICD-10-CM | POA: Diagnosis present

## 2016-02-24 DIAGNOSIS — R404 Transient alteration of awareness: Secondary | ICD-10-CM | POA: Diagnosis not present

## 2016-02-24 DIAGNOSIS — D62 Acute posthemorrhagic anemia: Secondary | ICD-10-CM

## 2016-02-24 DIAGNOSIS — R509 Fever, unspecified: Secondary | ICD-10-CM | POA: Diagnosis not present

## 2016-02-24 DIAGNOSIS — L8990 Pressure ulcer of unspecified site, unspecified stage: Secondary | ICD-10-CM | POA: Insufficient documentation

## 2016-02-24 DIAGNOSIS — R259 Unspecified abnormal involuntary movements: Secondary | ICD-10-CM | POA: Diagnosis not present

## 2016-02-24 DIAGNOSIS — N400 Enlarged prostate without lower urinary tract symptoms: Secondary | ICD-10-CM | POA: Diagnosis present

## 2016-02-24 DIAGNOSIS — T45511A Poisoning by anticoagulants, accidental (unintentional), initial encounter: Secondary | ICD-10-CM

## 2016-02-24 DIAGNOSIS — I4892 Unspecified atrial flutter: Secondary | ICD-10-CM | POA: Diagnosis not present

## 2016-02-24 DIAGNOSIS — D649 Anemia, unspecified: Secondary | ICD-10-CM | POA: Diagnosis not present

## 2016-02-24 DIAGNOSIS — R2681 Unsteadiness on feet: Secondary | ICD-10-CM | POA: Diagnosis not present

## 2016-02-24 DIAGNOSIS — M6281 Muscle weakness (generalized): Secondary | ICD-10-CM | POA: Diagnosis not present

## 2016-02-24 DIAGNOSIS — I352 Nonrheumatic aortic (valve) stenosis with insufficiency: Secondary | ICD-10-CM | POA: Diagnosis present

## 2016-02-24 DIAGNOSIS — W06XXXA Fall from bed, initial encounter: Secondary | ICD-10-CM | POA: Diagnosis present

## 2016-02-24 DIAGNOSIS — I700 Atherosclerosis of aorta: Secondary | ICD-10-CM | POA: Diagnosis present

## 2016-02-24 DIAGNOSIS — N390 Urinary tract infection, site not specified: Secondary | ICD-10-CM

## 2016-02-24 DIAGNOSIS — T148XXA Other injury of unspecified body region, initial encounter: Secondary | ICD-10-CM

## 2016-02-24 DIAGNOSIS — M5137 Other intervertebral disc degeneration, lumbosacral region: Secondary | ICD-10-CM | POA: Diagnosis present

## 2016-02-24 DIAGNOSIS — F0280 Dementia in other diseases classified elsewhere without behavioral disturbance: Secondary | ICD-10-CM | POA: Diagnosis not present

## 2016-02-24 DIAGNOSIS — M79622 Pain in left upper arm: Secondary | ICD-10-CM | POA: Diagnosis not present

## 2016-02-24 DIAGNOSIS — S20302D Unspecified superficial injuries of left front wall of thorax, subsequent encounter: Secondary | ICD-10-CM | POA: Diagnosis not present

## 2016-02-24 DIAGNOSIS — I2510 Atherosclerotic heart disease of native coronary artery without angina pectoris: Secondary | ICD-10-CM | POA: Diagnosis present

## 2016-02-24 DIAGNOSIS — R0789 Other chest pain: Secondary | ICD-10-CM | POA: Diagnosis not present

## 2016-02-24 LAB — URINALYSIS, ROUTINE W REFLEX MICROSCOPIC
Bilirubin Urine: NEGATIVE
Glucose, UA: NEGATIVE mg/dL
HGB URINE DIPSTICK: NEGATIVE
Ketones, ur: 5 mg/dL — AB
Leukocytes, UA: NEGATIVE
Nitrite: NEGATIVE
PROTEIN: 30 mg/dL — AB
SQUAMOUS EPITHELIAL / LPF: NONE SEEN
Specific Gravity, Urine: 1.028 (ref 1.005–1.030)
pH: 5 (ref 5.0–8.0)

## 2016-02-24 LAB — PROTIME-INR
INR: 7.51
PROTHROMBIN TIME: 66.2 s — AB (ref 11.4–15.2)

## 2016-02-24 LAB — CBC WITH DIFFERENTIAL/PLATELET
Basophils Absolute: 0 10*3/uL (ref 0.0–0.1)
Basophils Relative: 0 %
Eosinophils Absolute: 0 10*3/uL (ref 0.0–0.7)
Eosinophils Relative: 0 %
HEMATOCRIT: 30.3 % — AB (ref 39.0–52.0)
HEMOGLOBIN: 10.5 g/dL — AB (ref 13.0–17.0)
LYMPHS ABS: 1 10*3/uL (ref 0.7–4.0)
LYMPHS PCT: 6 %
MCH: 32.6 pg (ref 26.0–34.0)
MCHC: 34.7 g/dL (ref 30.0–36.0)
MCV: 94.1 fL (ref 78.0–100.0)
MONO ABS: 2.1 10*3/uL — AB (ref 0.1–1.0)
MONOS PCT: 12 %
NEUTROS ABS: 14.8 10*3/uL — AB (ref 1.7–7.7)
Neutrophils Relative %: 82 %
Platelets: 221 10*3/uL (ref 150–400)
RBC: 3.22 MIL/uL — ABNORMAL LOW (ref 4.22–5.81)
RDW: 12.8 % (ref 11.5–15.5)
WBC: 17.9 10*3/uL — ABNORMAL HIGH (ref 4.0–10.5)

## 2016-02-24 LAB — COMPREHENSIVE METABOLIC PANEL
ALBUMIN: 3.1 g/dL — AB (ref 3.5–5.0)
ALT: 18 U/L (ref 17–63)
AST: 27 U/L (ref 15–41)
Alkaline Phosphatase: 58 U/L (ref 38–126)
Anion gap: 7 (ref 5–15)
BUN: 41 mg/dL — AB (ref 6–20)
CHLORIDE: 104 mmol/L (ref 101–111)
CO2: 27 mmol/L (ref 22–32)
CREATININE: 0.98 mg/dL (ref 0.61–1.24)
Calcium: 7.8 mg/dL — ABNORMAL LOW (ref 8.9–10.3)
GFR calc Af Amer: >60 mL/min (ref >60)
GLUCOSE: 129 mg/dL — AB (ref 65–99)
POTASSIUM: 4.1 mmol/L (ref 3.5–5.1)
Sodium: 138 mmol/L (ref 135–145)
Total Bilirubin: 1 mg/dL (ref 0.3–1.2)
Total Protein: 5.9 g/dL — ABNORMAL LOW (ref 6.5–8.1)

## 2016-02-24 LAB — INFLUENZA PANEL BY PCR (TYPE A & B)
Influenza A By PCR: NEGATIVE
Influenza B By PCR: NEGATIVE

## 2016-02-24 LAB — I-STAT CG4 LACTIC ACID, ED
LACTIC ACID, VENOUS: 1.24 mmol/L (ref 0.5–1.9)
LACTIC ACID, VENOUS: 1 mmol/L (ref 0.5–1.9)

## 2016-02-24 LAB — MRSA PCR SCREENING: MRSA BY PCR: NEGATIVE

## 2016-02-24 LAB — ABO/RH
ABO/RH(D): O POS
ABO/RH(D): O POS

## 2016-02-24 MED ORDER — ENSURE ENLIVE PO LIQD
237.0000 mL | Freq: Two times a day (BID) | ORAL | Status: DC
  Administered 2016-02-25 – 2016-02-27 (×3): 237 mL via ORAL

## 2016-02-24 MED ORDER — SODIUM CHLORIDE 0.9 % IV SOLN
INTRAVENOUS | Status: DC
  Administered 2016-02-24 – 2016-02-26 (×3): via INTRAVENOUS

## 2016-02-24 MED ORDER — VANCOMYCIN HCL 500 MG IV SOLR
500.0000 mg | Freq: Two times a day (BID) | INTRAVENOUS | Status: DC
  Administered 2016-02-25 – 2016-02-27 (×5): 500 mg via INTRAVENOUS
  Filled 2016-02-24 (×7): qty 500

## 2016-02-24 MED ORDER — SODIUM CHLORIDE 0.9 % IV SOLN: 10.0000 mL/h | Freq: Once | INTRAVENOUS | Status: DC

## 2016-02-24 MED ORDER — IOPAMIDOL (ISOVUE-300) INJECTION 61%
INTRAVENOUS | Status: AC
  Filled 2016-02-24: qty 100

## 2016-02-24 MED ORDER — PHYTONADIONE 5 MG PO TABS
2.5000 mg | ORAL_TABLET | Freq: Once | ORAL | Status: AC
  Administered 2016-02-24: 2.5 mg via ORAL
  Filled 2016-02-24: qty 1

## 2016-02-24 MED ORDER — VANCOMYCIN HCL IN DEXTROSE 1-5 GM/200ML-% IV SOLN
1000.0000 mg | INTRAVENOUS | Status: AC
  Administered 2016-02-24: 1000 mg via INTRAVENOUS
  Filled 2016-02-24: qty 200

## 2016-02-24 MED ORDER — DEXTROSE 5 % IV SOLN
1.0000 g | INTRAVENOUS | Status: AC
  Administered 2016-02-24: 1 g via INTRAVENOUS
  Filled 2016-02-24: qty 1

## 2016-02-24 MED ORDER — IOPAMIDOL (ISOVUE-300) INJECTION 61%
100.0000 mL | Freq: Once | INTRAVENOUS | Status: AC | PRN
  Administered 2016-02-24: 100 mL via INTRAVENOUS

## 2016-02-24 MED ORDER — DEXTROSE 5 % IV SOLN
1.0000 g | Freq: Two times a day (BID) | INTRAVENOUS | Status: DC
  Administered 2016-02-25 – 2016-02-27 (×5): 1 g via INTRAVENOUS
  Filled 2016-02-24 (×6): qty 1

## 2016-02-24 MED ORDER — SODIUM CHLORIDE 0.9% FLUSH
3.0000 mL | Freq: Two times a day (BID) | INTRAVENOUS | Status: DC
  Administered 2016-02-25: 3 mL via INTRAVENOUS

## 2016-02-24 NOTE — ED Provider Notes (Signed)
[facility] DEPT Provider Note   CSN: [id number] Arrival date & time: 02/24/16  1247     History   Chief Complaint Chief Complaint  Patient presents with  . Weakness   Level V caveat due to dementia and altered mental status. HPI Marvin Wagner is a 83 y.o. male.  HPI Patient brought in for altered mental status. Seen 3 days ago and diagnosed with urinary tract infection. Had been a little more confused at that time but now is not getting out of bed. Had a fall around 2 weeks ago. He has been doing worse. Decreased oral intake. Reportedly complaining of pain in the left flank but has not really been talking much. He is on Coumadin. His INR level was high. Has bruising in his left flank. No reported fevers.  Past Medical History:  Diagnosis Date  . Aortic insufficiency and aortic stenosis   . Aortic stenosis    SEVERE  . CAD (coronary artery disease)   . Heart murmur   . Hyperlipidemia   . Hypertension   . Mild memory disturbance     Patient Active Problem List   Diagnosis Date Noted  . Hematoma of chest wall, left, initial encounter 02/24/2016  . Anemia 02/24/2016  . Fever 02/24/2016  . Supratherapeutic INR 02/24/2016  . Dementia 03/04/2015  . Hypotension 12/03/2014  . Weight loss, unintentional 10/17/2013  . History of elevated PSA 04/25/2013  . Encounter for therapeutic drug monitoring 02/26/2013  . Long term current use of anticoagulant therapy 12/19/2012  . Atrial flutter ([place]) 12/14/2012  . S/P AVR 06/13/2012  . Other vitamin B12 deficiency anemia 02/29/2012  . Memory disorder 02/14/2012  . Hx of CABG 07/01/2010  . Hyperlipidemia 07/01/2010  . Benign hypertensive heart disease without heart failure 07/01/2010    Past Surgical History:  Procedure Laterality Date  . AORTIC VALVE REPLACEMENT  12/17/08  . CARDIAC CATHETERIZATION    . CORONARY ANGIOPLASTY     NORMAL LEFT VENTRICULAR SIZEAND CONTRACTILITY WITH NORMAL SYSTOLIC FUNCTION. EF 65%  .  TONSILLECTOMY         Home Medications    Prior to Admission medications   Medication Sig Start Date End Date Taking? Authorizing Provider  acetaminophen (TYLENOL) 500 MG tablet Take 500 mg by mouth every 6 (six) hours as needed (for pain).    Yes Historical Provider, MD  atorvastatin (LIPITOR) 40 MG tablet Take 40 mg by mouth daily.   Yes Historical Provider, MD  calcium carbonate (OS-CAL) 600 MG TABS Take 600 mg by mouth 2 (two) times daily with a meal.     Yes Historical Provider, MD  cephALEXin (KEFLEX) 500 MG capsule Take 1 capsule (500 mg total) by mouth 3 (three) times daily. 02/21/16  Yes Lajean Saver, MD  Cholecalciferol (VITAMIN D) 2000 UNITS tablet Take 2,000 Units by mouth daily.     Yes Historical Provider, MD  donepezil (ARICEPT) 10 MG tablet TAKE 1 TABLET (10 MG TOTAL) BY MOUTH AT BEDTIME. 12/28/15  Yes Eulas Post, MD  memantine (NAMENDA) 10 MG tablet TAKE   1 TABLET (10MG ) BY MOUTH TWICE DAILY 09/28/15  Yes Eulas Post, MD  metoprolol tartrate (LOPRESSOR) 25 MG tablet Take 0.5 tablets (12.5 mg total) by mouth daily. 11/02/15  Yes Mihai Croitoru, MD  Multiple Vitamin (MULTIVITAMIN) tablet Take 1 tablet by mouth daily.     Yes Historical Provider, MD  vitamin C (ASCORBIC ACID) 500 MG tablet Take 500 mg by mouth daily.  Yes Historical Provider, MD  warfarin (COUMADIN) 5 MG tablet Take as directed by anticoagulation clinic 11/09/15  Yes Eulas Post, MD  atorvastatin (LIPITOR) 10 MG tablet TAKE   1 TABLET (10MG ) BY MOUTH   DAILY Patient not taking: Reported on 02/24/2016 02/15/16   Eulas Post, MD    Family History Family History  Problem Relation Age of Onset  . Ankylosing spondylitis Brother     Social History Social History  Substance Use Topics  . Smoking status: Former Smoker    Quit date: 06/30/2006  . Smokeless tobacco: Not on file  . Alcohol use No     Allergies   Penicillins   Review of Systems Review of Systems  Unable to  perform ROS: Dementia  Constitutional: Positive for appetite change. Negative for fever.  Respiratory: Negative for shortness of breath.   Cardiovascular: Negative for chest pain.  Gastrointestinal: Negative for abdominal pain.  Genitourinary: Positive for flank pain.  Musculoskeletal: Negative for back pain.     Physical Exam Updated Vital Signs BP 136/74   Pulse 96   Temp 98.8 F (37.1 C) (Axillary)   Resp 16   Ht 5\' 10"  (1.778 m)   Wt 155 lb (70.3 kg)   SpO2 98%   BMI 22.24 kg/m   Physical Exam  Constitutional: He appears well-developed.  HENT:  Head: Normocephalic.  Eyes:  Pupils are somewhat constricted bilaterally  Neck: Neck supple.  Cardiovascular: Normal rate.   Pulmonary/Chest: Effort normal. He has no wheezes.  Fullness left anterior chest wall  Abdominal: There is no tenderness.  May be mild fullness in left lower abdomen. Left flank tenderness ecchymosis and some swelling. Goes from lower ribs down to lower flank area.  Musculoskeletal: He exhibits no tenderness.  Neurological:  Patient is initially sleeping. Then eventually woke up after some stimulation. Still confused and not really able answer my questions.  Skin: Skin is warm.     ED Treatments / Results  Labs (all labs ordered are listed, but only abnormal results are displayed) Labs Reviewed  COMPREHENSIVE METABOLIC PANEL - Abnormal; Notable for the following:       Result Value   Glucose, Bld 129 (*)    BUN 41 (*)    Calcium 7.8 (*)    Total Protein 5.9 (*)    Albumin 3.1 (*)    All other components within normal limits  PROTIME-INR - Abnormal; Notable for the following:    Prothrombin Time 66.2 (*)    INR 7.51 (*)    All other components within normal limits  CBC WITH DIFFERENTIAL/PLATELET - Abnormal; Notable for the following:    WBC 17.9 (*)    RBC 3.22 (*)    Hemoglobin 10.5 (*)    HCT 30.3 (*)    Neutro Abs 14.8 (*)    Monocytes Absolute 2.1 (*)    All other components within  normal limits  URINALYSIS, ROUTINE W REFLEX MICROSCOPIC - Abnormal; Notable for the following:    Ketones, ur 5 (*)    Protein, ur 30 (*)    Bacteria, UA FEW (*)    All other components within normal limits  MRSA PCR SCREENING  CULTURE, BLOOD (ROUTINE X 2)  CULTURE, BLOOD (ROUTINE X 2)  URINE CULTURE  INFLUENZA PANEL BY PCR (TYPE A & B)  CBC WITH DIFFERENTIAL/PLATELET  PROTIME-INR  COMPREHENSIVE METABOLIC PANEL  I-STAT CG4 LACTIC ACID, ED  I-STAT CG4 LACTIC ACID, ED  PREPARE FRESH FROZEN PLASMA  ABO/RH  ABO/RH    EKG  EKG Interpretation  Date/Time:  Wednesday February 24 2016 14:34:36 EST Ventricular Rate:  86 PR Interval:    QRS Duration: 53 QT Interval:  429 QTC Calculation: 514 R Axis:   -37 Text Interpretation:  Sinus rhythm Left axis deviation Low voltage, extremity leads Prolonged QT interval Confirmed by Alvino Chapel  MD, Bradley Bostelman [phone]) on 02/24/2016 3:37:17 PM       Radiology Ct Head Wo Contrast  Result Date: 02/24/2016 CLINICAL DATA:  Lethargy. Patient fell 2 weeks ago hitting left side. EXAM: CT HEAD WITHOUT CONTRAST TECHNIQUE: Contiguous axial images were obtained from the base of the skull through the vertex without intravenous contrast. COMPARISON:  02/06/2016 CT FINDINGS: BRAIN: There is mild-to-moderate sulcal and ventricular prominence consistent with superficial and central atrophy. No intraparenchymal hemorrhage, mass effect nor midline shift. Periventricular and subcortical white matter hypodensities consistent with chronic small vessel ischemic disease are identified. No acute large vascular territory infarcts. No abnormal extra-axial fluid collections. Basal cisterns are not effaced and midline. VASCULAR: Moderate calcific atherosclerosis of the carotid siphons. SKULL: No skull fracture. No significant scalp soft tissue swelling. SINUSES/ORBITS: The mastoid air-cells are clear. The included paranasal sinuses are well-aerated.The included ocular globes and  orbital contents are non-suspicious. OTHER: None. IMPRESSION: Mild to moderate superficial and central atrophy with chronic small vessel ischemic disease of periventricular and subcortical white matter. No acute intracranial abnormality. Electronically Signed   By: Ashley Royalty M.D.   On: 02/24/2016 17:48   Ct Chest W Contrast  Result Date: 02/24/2016 CLINICAL DATA:  Pt comes from [facility] ([facility]isted [facility] Staff sts pt was very lethargic today, pt dry, very thirsty per EMS. Chronic back pain per EMS. Pt had fall two weeks ago hit left side EXAM: CT CHEST, ABDOMEN, AND PELVIS WITH CONTRAST TECHNIQUE: Multidetector CT imaging of the chest, abdomen and pelvis was performed following the standard protocol during bolus administration of intravenous contrast. CONTRAST:  152mL ISOVUE-300 IOPAMIDOL (ISOVUE-300) INJECTION 61% COMPARISON:  None. FINDINGS: CT CHEST FINDINGS Cardiovascular: No significant vascular findings. Normal heart size. No pericardial effusion. Thoracic aorta is normal in caliber. No thoracic aortic dissection. Thoracic aortic atherosclerosis. Prior CABG. Mediastinum/Nodes: No enlarged mediastinal, hilar, or axillary lymph nodes. Thyroid gland, trachea, and esophagus demonstrate no significant findings. Lungs/Pleura: Small left pleural effusion. No focal consolidation or pneumothorax. Musculoskeletal: No acute osseous abnormality. Large heterogeneous mass measuring 9.5 x 7.6 x 13.4 cm deep to the left pectoralis major muscle and may be centered within the left pectoralis minor muscle. There is expansion of the intercostal muscle between the left second and third rib adjacent to the mass resulting in a convex margin along the pleural surface and a similar of fact on the adjacent third and fourth intercostal space. There is expansion of the left serratus anterior muscle with adjacent inflammatory changes. There is expansion of the left latissimus muscle with surrounding inflammatory changes.  Small amount hemorrhagic fluid tracks along the left lateral chest wall and into the left lateral abdominal wall. CT ABDOMEN PELVIS FINDINGS Hepatobiliary: No focal liver abnormality is seen. No gallstones, gallbladder wall thickening, or biliary dilatation. Pancreas: Unremarkable. No pancreatic ductal dilatation or surrounding inflammatory changes. Spleen: Normal in size without focal abnormality. Adrenals/Urinary Tract: Adrenal glands are unremarkable. Kidneys are normal, without renal calculi, focal lesion, or hydronephrosis. Bladder is unremarkable. Stomach/Bowel: Stomach is within normal limits. Extensive sigmoid diverticulosis without evidence of diverticulitis. No evidence of bowel wall thickening, distention, or inflammatory changes. Vascular/Lymphatic: Normal caliber abdominal  aorta with atherosclerosis. Reproductive: Prostate enlargement impressing on the bladder base. Other: No abdominal wall hernia or abnormality. No abdominopelvic ascites. Musculoskeletal: Severe degenerative disc disease with disc height loss at L4-5 and L5-S1 with bilateral facet arthropathy. Generalized osteopenia. No acute osseous abnormality. IMPRESSION: 1. Large heterogeneous mass measuring 9.5 x 7.6 x 13.4 cm deep to the left pectoralis major muscle and may be centered within the left pectoralis minor muscle likely reflecting a large intramuscular hematoma. Expansion of the intercostal muscle between the left second and third rib adjacent to the mass resulting in a convex margin along the pleural surface and a similar affect on the adjacent third and fourth intercostal muscle likely also reflecting an intramuscular hematoma. 2. Expansion of the left serratus anterior and latissimus muscles with adjacent inflammatory changes consistent with intramuscular hematomas. 3. Close clinical follow-up is recommended to document complete resolution. In the absence of resolution, follow-up imaging is recommended to evaluate for in the  underlying pathology. Electronically Signed   By: Kathreen Devoid   On: 02/24/2016 17:54   Ct Abdomen Pelvis W Contrast  Result Date: 02/24/2016 CLINICAL DATA:  Pt comes from [facility] ([place]). Staff sts pt was very lethargic today, pt dry, very thirsty per EMS. Chronic back pain per EMS. Pt had fall two weeks ago hit left side EXAM: CT CHEST, ABDOMEN, AND PELVIS WITH CONTRAST TECHNIQUE: Multidetector CT imaging of the chest, abdomen and pelvis was performed following the standard protocol during bolus administration of intravenous contrast. CONTRAST:  151mL ISOVUE-300 IOPAMIDOL (ISOVUE-300) INJECTION 61% COMPARISON:  None. FINDINGS: CT CHEST FINDINGS Cardiovascular: No significant vascular findings. Normal heart size. No pericardial effusion. Thoracic aorta is normal in caliber. No thoracic aortic dissection. Thoracic aortic atherosclerosis. Prior CABG. Mediastinum/Nodes: No enlarged mediastinal, hilar, or axillary lymph nodes. Thyroid gland, trachea, and esophagus demonstrate no significant findings. Lungs/Pleura: Small left pleural effusion. No focal consolidation or pneumothorax. Musculoskeletal: No acute osseous abnormality. Large heterogeneous mass measuring 9.5 x 7.6 x 13.4 cm deep to the left pectoralis major muscle and may be centered within the left pectoralis minor muscle. There is expansion of the intercostal muscle between the left second and third rib adjacent to the mass resulting in a convex margin along the pleural surface and a similar of fact on the adjacent third and fourth intercostal space. There is expansion of the left serratus anterior muscle with adjacent inflammatory changes. There is expansion of the left latissimus muscle with surrounding inflammatory changes. Small amount hemorrhagic fluid tracks along the left lateral chest wall and into the left lateral abdominal wall. CT ABDOMEN PELVIS FINDINGS Hepatobiliary: No focal liver abnormality is seen. No gallstones,  gallbladder wall thickening, or biliary dilatation. Pancreas: Unremarkable. No pancreatic ductal dilatation or surrounding inflammatory changes. Spleen: Normal in size without focal abnormality. Adrenals/Urinary Tract: Adrenal glands are unremarkable. Kidneys are normal, without renal calculi, focal lesion, or hydronephrosis. Bladder is unremarkable. Stomach/Bowel: Stomach is within normal limits. Extensive sigmoid diverticulosis without evidence of diverticulitis. No evidence of bowel wall thickening, distention, or inflammatory changes. Vascular/Lymphatic: Normal caliber abdominal aorta with atherosclerosis. Reproductive: Prostate enlargement impressing on the bladder base. Other: No abdominal wall hernia or abnormality. No abdominopelvic ascites. Musculoskeletal: Severe degenerative disc disease with disc height loss at L4-5 and L5-S1 with bilateral facet arthropathy. Generalized osteopenia. No acute osseous abnormality. IMPRESSION: 1. Large heterogeneous mass measuring 9.5 x 7.6 x 13.4 cm deep to the left pectoralis major muscle and may be centered within the left pectoralis minor muscle likely reflecting  a large intramuscular hematoma. Expansion of the intercostal muscle between the left second and third rib adjacent to the mass resulting in a convex margin along the pleural surface and a similar affect on the adjacent third and fourth intercostal muscle likely also reflecting an intramuscular hematoma. 2. Expansion of the left serratus anterior and latissimus muscles with adjacent inflammatory changes consistent with intramuscular hematomas. 3. Close clinical follow-up is recommended to document complete resolution. In the absence of resolution, follow-up imaging is recommended to evaluate for in the underlying pathology. Electronically Signed   By: Kathreen Devoid   On: 02/24/2016 17:54    Procedures Procedures (including critical care time)  Medications Ordered in ED Medications  ceFEPIme (MAXIPIME) 1 g  in dextrose 5 % 50 mL IVPB (not administered)  vancomycin (VANCOCIN) 500 mg in sodium chloride 0.9 % 100 mL IVPB (not administered)  iopamidol (ISOVUE-300) 61 % injection (not administered)  0.9 %  sodium chloride infusion (not administered)  sodium chloride flush (NS) 0.9 % injection 3 mL (not administered)  0.9 %  sodium chloride infusion ( Intravenous New Bag/Given 02/24/16 2145)  feeding supplement (ENSURE ENLIVE) (ENSURE ENLIVE) liquid 237 mL (not administered)  donepezil (ARICEPT) tablet 10 mg (not administered)  metoprolol tartrate (LOPRESSOR) tablet 12.5 mg (not administered)  memantine (NAMENDA) tablet 10 mg (not administered)  acetaminophen (TYLENOL) tablet 500 mg (not administered)  ceFEPIme (MAXIPIME) 1 g in dextrose 5 % 50 mL IVPB (0 g Intravenous Stopped 02/24/16 1722)  vancomycin (VANCOCIN) IVPB 1000 mg/200 mL premix (0 mg Intravenous Stopped 02/24/16 2013)  iopamidol (ISOVUE-300) 61 % injection 100 mL (100 mLs Intravenous Contrast Given 02/24/16 1724)  phytonadione (VITAMIN K) tablet 2.5 mg (2.5 mg Oral Given 02/24/16 2040)     Initial Impression / Assessment and Plan / ED Course  I have reviewed the triage vital signs and the nursing notes.  Pertinent labs & imaging results that were available during my care of the patient were reviewed by me and considered in my medical decision making (see chart for details).     Patient with fever. Being treated for UTI. Has anemia and hematomas Intramuscularly on his chest  Wall. Also Coumadin toxic. Will give some FFP and vitamin K after discussion with Dr. Olevia Bowens. Admit to internal medicine.  Final Clinical Impressions(s) / ED Diagnoses   Final diagnoses:  Anemia, unspecified type  Poisoning by warfarin sodium, accidental or unintentional, initial encounter  Fever, unspecified fever cause  Intramuscular hematoma    New Prescriptions Current Discharge Medication List       Davonna Belling, MD 02/25/16 [phone]

## 2016-02-24 NOTE — Progress Notes (Signed)
Pharmacy Antibiotic Note  Marvin Wagner is a 83 y.o. male admitted on 02/24/2016 with sepsis, UTI.  He was diagnosed with UTI on 02/21/16 and started on Keflex.  Pharmacy has been consulted for Cefepime and Vancomycin dosing.  Noted PCN allergy (unspecified reaction), but patient has tolerated Ceftriaxone on 02/21/16 and outpatient cephalexin.  SCr 0.98, CrCl ~ 57 ml/min WBC 17.9 Lactic acid 1.24  Plan:  Cefepime 1g IV q12h  Vancomycin 1g IV x1 then 500mg  IV q12h.  Measure Vanc trough at steady state.  Follow up renal fxn, culture results, and clinical course.  Height: 5\' 10"  (177.8 cm) Weight: 155 lb (70.3 kg) IBW/kg (Calculated) : 73  Temp (24hrs), Avg:99.3 F (37.4 C), Min:97.6 F (36.4 C), Max:101 F (38.3 C)   Recent Labs Lab 02/21/16 1145 02/24/16 1436 02/24/16 1448  WBC 15.9* 17.9*  --   CREATININE 0.94 0.98  --   LATICACIDVEN  --   --  1.24    Estimated Creatinine Clearance: 56.8 mL/min (by C-G formula based on SCr of 0.98 mg/dL).    Allergies  Allergen Reactions  . Penicillins     Antimicrobials this admission: 1/24 Vancomycin >>  1/24 Cefepime >>    Dose adjustments this admission:   Microbiology results: 1/24 BCx: sent 1/24 UCx: sent   Thank you for allowing pharmacy to be a part of this patient's care.  Gretta Arab PharmD, BCPS Pager [phone] 02/24/2016 3:41 PM

## 2016-02-24 NOTE — ED Notes (Signed)
Bed: WA07 Expected date:  Expected time:  Means of arrival:  Comments: EMS-dehydration

## 2016-02-24 NOTE — ED Triage Notes (Signed)
Pt comes from [facility] (Assisted Living).  Staff sts pt was very lethargic today, pt dry, very thirsty per EMS. Chronic back pain per EMS.  Pt had fall two weeks ago hit left side (has bruise).  VSS.  CBG: 121.

## 2016-02-24 NOTE — H&P (Signed)
History and Physical    LUZ EATHERLY [id number] DOB: 03/29/1930 DOA: 02/24/2016  PCP: Eulas Post, MD   Patient coming from: Home.  Chief Complaint: Weakness.  HPI: Marvin Wagner is a 83 y.o. male with medical history significant of aortic insufficiency, aortic stenosis, CAD, hyperlipidemia, hypertension, memory disturbance who is being brought to the emergency department from [facility] assisted living for evaluation of lethargy, decreased appetite and fluid intake. The patient had a fall about two weeks ago and has an extensive area of ecchymosis on his left lower chest wall and right upper flank areas.   ED Course: The patient's INR was 7.51, hemoglobin decreased from 13.4 on 02/21/2016 to 10.5 today. WBC 17.9 and platelets 221. Lactic acid 1.24, sodium 138 potassium 4.1, chloride 104, bicarbonate 27, BUN 41, creatinine 0.98 and glucose 129. His influenza and MRSA by PCR were negative. He received FFP and vitamin K in the emergency department.  CT scan of the chest showed large hematoma in the left pectoral area. Please see full report for further detail.  Review of Systems: As per HPI otherwise 10 point review of systems negative.    Past Medical History:  Diagnosis Date  . Aortic insufficiency and aortic stenosis   . Aortic stenosis    SEVERE  . CAD (coronary artery disease)   . Heart murmur   . Hyperlipidemia   . Hypertension   . Mild memory disturbance     Past Surgical History:  Procedure Laterality Date  . AORTIC VALVE REPLACEMENT  12/17/08  . CARDIAC CATHETERIZATION    . CORONARY ANGIOPLASTY     NORMAL LEFT VENTRICULAR SIZEAND CONTRACTILITY WITH NORMAL SYSTOLIC FUNCTION. EF 65%  . TONSILLECTOMY       reports that he quit smoking about 9 years ago. He does not have any smokeless tobacco history on file. He reports that he does not drink alcohol or use drugs.  Allergies  Allergen Reactions  . Penicillins Other (See Comments)    Has patient had a  PCN reaction causing immediate rash, facial/tongue/throat swelling, SOB or lightheadedness with hypotension: Unknown Has patient had a PCN reaction causing severe rash involving mucus membranes or skin necrosis: Unknown Has patient had a PCN reaction that required hospitalization: Unknown Has patient had a PCN reaction occurring within the last 10 years: Unknown If all of the above answers are "NO", then may proceed with Cephalosporin use.     Family History  Problem Relation Age of Onset  . Ankylosing spondylitis Brother     Prior to Admission medications   Medication Sig Start Date End Date Taking? Authorizing Provider  acetaminophen (TYLENOL) 500 MG tablet Take 500 mg by mouth every 6 (six) hours as needed (for pain).    Yes Historical Provider, MD  atorvastatin (LIPITOR) 40 MG tablet Take 40 mg by mouth daily.   Yes Historical Provider, MD  calcium carbonate (OS-CAL) 600 MG TABS Take 600 mg by mouth 2 (two) times daily with a meal.     Yes Historical Provider, MD  cephALEXin (KEFLEX) 500 MG capsule Take 1 capsule (500 mg total) by mouth 3 (three) times daily. 02/21/16  Yes Lajean Saver, MD  Cholecalciferol (VITAMIN D) 2000 UNITS tablet Take 2,000 Units by mouth daily.     Yes Historical Provider, MD  donepezil (ARICEPT) 10 MG tablet TAKE 1 TABLET (10 MG TOTAL) BY MOUTH AT BEDTIME. 12/28/15  Yes Eulas Post, MD  memantine (NAMENDA) 10 MG tablet TAKE   1 TABLET (10MG )  BY MOUTH TWICE DAILY 09/28/15  Yes Eulas Post, MD  metoprolol tartrate (LOPRESSOR) 25 MG tablet Take 0.5 tablets (12.5 mg total) by mouth daily. 11/02/15  Yes Mihai Croitoru, MD  Multiple Vitamin (MULTIVITAMIN) tablet Take 1 tablet by mouth daily.     Yes Historical Provider, MD  vitamin C (ASCORBIC ACID) 500 MG tablet Take 500 mg by mouth daily.     Yes Historical Provider, MD  warfarin (COUMADIN) 5 MG tablet Take as directed by anticoagulation clinic 11/09/15  Yes Eulas Post, MD  atorvastatin (LIPITOR) 10  MG tablet TAKE   1 TABLET (10MG ) BY MOUTH   DAILY Patient not taking: Reported on 02/24/2016 02/15/16   Eulas Post, MD    Physical Exam:  Constitutional: NAD, calm, comfortable Vitals:   02/24/16 1440 02/24/16 1521 02/24/16 1642 02/24/16 1806  BP:  127/57 139/74 135/74  Pulse:  90 94 90  Resp:  17 17 18   Temp: 101 F (38.3 C)     TempSrc: Rectal     SpO2:  94% 95% 96%  Weight:      Height:       Eyes: PERRL, lids and conjunctivae normal ENMT: Mucous membranes are moist. Posterior pharynx clear of any exudate or lesions. Neck: normal, supple, no masses, no thyromegaly Respiratory: Decreased breath sounds in bases, but no wheezing, no crackles. Normal respiratory effort. No accessory muscle use.  Cardiovascular: Regular rate and rhythm, no murmurs / rubs / gallops. No extremity edema. 2+ pedal pulses. No carotid bruits.  Abdomen: Soft, left upper quadrant tenderness, no guarding/rebound/masses palpated. No hepatosplenomegaly. Bowel sounds positive.  Musculoskeletal: no clubbing / cyanosis. Good ROM, no contractures. Normal muscle tone.  Skin: Large area of ecchymosis in left chest wall and pectoral areas. Neurologic: Supple extremities. Generalized weakness. Psychiatric:  Alert and oriented x 2. Lethargic.    Labs on Admission: I have personally reviewed following labs and imaging studies  CBC:  Recent Labs Lab 02/21/16 1145 02/24/16 1436  WBC 15.9* 17.9*  NEUTROABS  --  14.8*  HGB 13.4 10.5*  HCT 38.0* 30.3*  MCV 92.2 94.1  PLT 239 [id number]   Basic Metabolic Panel:  Recent Labs Lab 02/21/16 1145 02/24/16 1436  NA 140 138  K 4.2 4.1  CL 104 104  CO2 27 27  GLUCOSE 109* 129*  BUN 23* 41*  CREATININE 0.94 0.98  CALCIUM 8.6* 7.8*   GFR: Estimated Creatinine Clearance: 56.8 mL/min (by C-G formula based on SCr of 0.98 mg/dL). Liver Function Tests:  Recent Labs Lab 02/24/16 1436  AST 27  ALT 18  ALKPHOS 58  BILITOT 1.0  PROT 5.9*  ALBUMIN 3.1*   No  results for input(s): LIPASE, AMYLASE in the last 168 hours. No results for input(s): AMMONIA in the last 168 hours. Coagulation Profile:  Recent Labs Lab 02/21/16 1145 02/24/16 1436  INR 6.45* 7.51*   Cardiac Enzymes: No results for input(s): CKTOTAL, CKMB, CKMBINDEX, TROPONINI in the last 168 hours. BNP (last 3 results) No results for input(s): PROBNP in the last 8760 hours. HbA1C: No results for input(s): HGBA1C in the last 72 hours. CBG: No results for input(s): GLUCAP in the last 168 hours. Lipid Profile: No results for input(s): CHOL, HDL, LDLCALC, TRIG, CHOLHDL, LDLDIRECT in the last 72 hours. Thyroid Function Tests: No results for input(s): TSH, T4TOTAL, FREET4, T3FREE, THYROIDAB in the last 72 hours. Anemia Panel: No results for input(s): VITAMINB12, FOLATE, FERRITIN, TIBC, IRON, RETICCTPCT in the last 72 hours. Urine  analysis:    Component Value Date/Time   COLORURINE YELLOW 02/24/2016 1641   APPEARANCEUR CLEAR 02/24/2016 1641   LABSPEC 1.028 02/24/2016 1641   PHURINE 5.0 02/24/2016 1641   GLUCOSEU NEGATIVE 02/24/2016 1641   HGBUR NEGATIVE 02/24/2016 1641   BILIRUBINUR NEGATIVE 02/24/2016 1641   KETONESUR 5 (A) 02/24/2016 1641   PROTEINUR 30 (A) 02/24/2016 1641   UROBILINOGEN 0.2 12/15/2008 1405   NITRITE NEGATIVE 02/24/2016 1641   LEUKOCYTESUR NEGATIVE 02/24/2016 1641   Radiological Exams on Admission: Ct Head Wo Contrast  Result Date: 02/24/2016 CLINICAL DATA:  Lethargy. Patient fell 2 weeks ago hitting left side. EXAM: CT HEAD WITHOUT CONTRAST TECHNIQUE: Contiguous axial images were obtained from the base of the skull through the vertex without intravenous contrast. COMPARISON:  02/06/2016 CT FINDINGS: BRAIN: There is mild-to-moderate sulcal and ventricular prominence consistent with superficial and central atrophy. No intraparenchymal hemorrhage, mass effect nor midline shift. Periventricular and subcortical white matter hypodensities consistent with chronic  small vessel ischemic disease are identified. No acute large vascular territory infarcts. No abnormal extra-axial fluid collections. Basal cisterns are not effaced and midline. VASCULAR: Moderate calcific atherosclerosis of the carotid siphons. SKULL: No skull fracture. No significant scalp soft tissue swelling. SINUSES/ORBITS: The mastoid air-cells are clear. The included paranasal sinuses are well-aerated.The included ocular globes and orbital contents are non-suspicious. OTHER: None. IMPRESSION: Mild to moderate superficial and central atrophy with chronic small vessel ischemic disease of periventricular and subcortical white matter. No acute intracranial abnormality. Electronically Signed   By: Ashley Royalty M.D.   On: 02/24/2016 17:48   Ct Chest W Contrast  Result Date: 02/24/2016 CLINICAL DATA:  Pt comes from [facility] (Assisted Living). Staff sts pt was very lethargic today, pt dry, very thirsty per EMS. Chronic back pain per EMS. Pt had fall two weeks ago hit left side EXAM: CT CHEST, ABDOMEN, AND PELVIS WITH CONTRAST TECHNIQUE: Multidetector CT imaging of the chest, abdomen and pelvis was performed following the standard protocol during bolus administration of intravenous contrast. CONTRAST:  14mL ISOVUE-300 IOPAMIDOL (ISOVUE-300) INJECTION 61% COMPARISON:  None. FINDINGS: CT CHEST FINDINGS Cardiovascular: No significant vascular findings. Normal heart size. No pericardial effusion. Thoracic aorta is normal in caliber. No thoracic aortic dissection. Thoracic aortic atherosclerosis. Prior CABG. Mediastinum/Nodes: No enlarged mediastinal, hilar, or axillary lymph nodes. Thyroid gland, trachea, and esophagus demonstrate no significant findings. Lungs/Pleura: Small left pleural effusion. No focal consolidation or pneumothorax. Musculoskeletal: No acute osseous abnormality. Large heterogeneous mass measuring 9.5 x 7.6 x 13.4 cm deep to the left pectoralis major muscle and may be centered within the  left pectoralis minor muscle. There is expansion of the intercostal muscle between the left second and third rib adjacent to the mass resulting in a convex margin along the pleural surface and a similar of fact on the adjacent third and fourth intercostal space. There is expansion of the left serratus anterior muscle with adjacent inflammatory changes. There is expansion of the left latissimus muscle with surrounding inflammatory changes. Small amount hemorrhagic fluid tracks along the left lateral chest wall and into the left lateral abdominal wall. CT ABDOMEN PELVIS FINDINGS Hepatobiliary: No focal liver abnormality is seen. No gallstones, gallbladder wall thickening, or biliary dilatation. Pancreas: Unremarkable. No pancreatic ductal dilatation or surrounding inflammatory changes. Spleen: Normal in size without focal abnormality. Adrenals/Urinary Tract: Adrenal glands are unremarkable. Kidneys are normal, without renal calculi, focal lesion, or hydronephrosis. Bladder is unremarkable. Stomach/Bowel: Stomach is within normal limits. Extensive sigmoid diverticulosis without evidence of diverticulitis. No  evidence of bowel wall thickening, distention, or inflammatory changes. Vascular/Lymphatic: Normal caliber abdominal aorta with atherosclerosis. Reproductive: Prostate enlargement impressing on the bladder base. Other: No abdominal wall hernia or abnormality. No abdominopelvic ascites. Musculoskeletal: Severe degenerative disc disease with disc height loss at L4-5 and L5-S1 with bilateral facet arthropathy. Generalized osteopenia. No acute osseous abnormality. IMPRESSION: 1. Large heterogeneous mass measuring 9.5 x 7.6 x 13.4 cm deep to the left pectoralis major muscle and may be centered within the left pectoralis minor muscle likely reflecting a large intramuscular hematoma. Expansion of the intercostal muscle between the left second and third rib adjacent to the mass resulting in a convex margin along the  pleural surface and a similar affect on the adjacent third and fourth intercostal muscle likely also reflecting an intramuscular hematoma. 2. Expansion of the left serratus anterior and latissimus muscles with adjacent inflammatory changes consistent with intramuscular hematomas. 3. Close clinical follow-up is recommended to document complete resolution. In the absence of resolution, follow-up imaging is recommended to evaluate for in the underlying pathology. Electronically Signed   By: Kathreen Devoid   On: 02/24/2016 17:54   Ct Abdomen Pelvis W Contrast  Result Date: 02/24/2016 CLINICAL DATA:  Pt comes from [facility] ([place]). Staff sts pt was very lethargic today, pt dry, very thirsty per EMS. Chronic back pain per EMS. Pt had fall two weeks ago hit left side EXAM: CT CHEST, ABDOMEN, AND PELVIS WITH CONTRAST TECHNIQUE: Multidetector CT imaging of the chest, abdomen and pelvis was performed following the standard protocol during bolus administration of intravenous contrast. CONTRAST:  13mL ISOVUE-300 IOPAMIDOL (ISOVUE-300) INJECTION 61% COMPARISON:  None. FINDINGS: CT CHEST FINDINGS Cardiovascular: No significant vascular findings. Normal heart size. No pericardial effusion. Thoracic aorta is normal in caliber. No thoracic aortic dissection. Thoracic aortic atherosclerosis. Prior CABG. Mediastinum/Nodes: No enlarged mediastinal, hilar, or axillary lymph nodes. Thyroid gland, trachea, and esophagus demonstrate no significant findings. Lungs/Pleura: Small left pleural effusion. No focal consolidation or pneumothorax. Musculoskeletal: No acute osseous abnormality. Large heterogeneous mass measuring 9.5 x 7.6 x 13.4 cm deep to the left pectoralis major muscle and may be centered within the left pectoralis minor muscle. There is expansion of the intercostal muscle between the left second and third rib adjacent to the mass resulting in a convex margin along the pleural surface and a similar of fact on  the adjacent third and fourth intercostal space. There is expansion of the left serratus anterior muscle with adjacent inflammatory changes. There is expansion of the left latissimus muscle with surrounding inflammatory changes. Small amount hemorrhagic fluid tracks along the left lateral chest wall and into the left lateral abdominal wall. CT ABDOMEN PELVIS FINDINGS Hepatobiliary: No focal liver abnormality is seen. No gallstones, gallbladder wall thickening, or biliary dilatation. Pancreas: Unremarkable. No pancreatic ductal dilatation or surrounding inflammatory changes. Spleen: Normal in size without focal abnormality. Adrenals/Urinary Tract: Adrenal glands are unremarkable. Kidneys are normal, without renal calculi, focal lesion, or hydronephrosis. Bladder is unremarkable. Stomach/Bowel: Stomach is within normal limits. Extensive sigmoid diverticulosis without evidence of diverticulitis. No evidence of bowel wall thickening, distention, or inflammatory changes. Vascular/Lymphatic: Normal caliber abdominal aorta with atherosclerosis. Reproductive: Prostate enlargement impressing on the bladder base. Other: No abdominal wall hernia or abnormality. No abdominopelvic ascites. Musculoskeletal: Severe degenerative disc disease with disc height loss at L4-5 and L5-S1 with bilateral facet arthropathy. Generalized osteopenia. No acute osseous abnormality. IMPRESSION: 1. Large heterogeneous mass measuring 9.5 x 7.6 x 13.4 cm deep to the left pectoralis major  muscle and may be centered within the left pectoralis minor muscle likely reflecting a large intramuscular hematoma. Expansion of the intercostal muscle between the left second and third rib adjacent to the mass resulting in a convex margin along the pleural surface and a similar affect on the adjacent third and fourth intercostal muscle likely also reflecting an intramuscular hematoma. 2. Expansion of the left serratus anterior and latissimus muscles with adjacent  inflammatory changes consistent with intramuscular hematomas. 3. Close clinical follow-up is recommended to document complete resolution. In the absence of resolution, follow-up imaging is recommended to evaluate for in the underlying pathology. Electronically Signed   By: Kathreen Devoid   On: 02/24/2016 17:54    EKG: Independently reviewed Vent. rate 86 BPM PR interval * ms QRS duration 53 ms QT/QTc 429/514 ms P-R-T axes * -37 -59 Sinus rhythm Left axis deviation Low voltage, extremity leads Prolonged QT interval  Assessment/Plan Principal Problem:   Hematoma of chest wall, left, initial encounter Active Problems:   Supratherapeutic INR Received FFP and vitamin K in the emergency department. Follow-up PT/INR in a.m.    Fever Unknown source. Continue IV antibiotics. Follow-up cultures and sensitivity.    Hx of CABG/CAD On metoprolol and atorvastatin. Warfarin has been held due to hematoma.    Hyperlipidemia On atorvastatin    Atrial flutter (HCC) Continue metoprolol for rate control. Warfarin has been held due to hematoma.    Dementia Continue Aricept and Namenda. Supportive care.    Anemia Monitor hematocrit and hemoglobin       DVT prophylaxis: SCDs. Code Status: Full code. Family Communication:  Disposition Plan: Admit for warfarin therapy reversal and IV antibiotics for 2-3 days. Consults called:  Admission status: Inpatient/telemetry.   Reubin Milan MD [facility] Pager [phone]  If 7PM-7AM, please contact night-coverage [email or web address] Password [facility]  02/24/2016, 7:53 PM

## 2016-02-24 NOTE — ED Notes (Signed)
Unable to collect  I stat patient is not in the room

## 2016-02-25 ENCOUNTER — Encounter (HOSPITAL_COMMUNITY): Payer: Self-pay | Admitting: *Deleted

## 2016-02-25 DIAGNOSIS — G309 Alzheimer's disease, unspecified: Secondary | ICD-10-CM

## 2016-02-25 DIAGNOSIS — L8990 Pressure ulcer of unspecified site, unspecified stage: Secondary | ICD-10-CM | POA: Insufficient documentation

## 2016-02-25 DIAGNOSIS — I4892 Unspecified atrial flutter: Secondary | ICD-10-CM

## 2016-02-25 DIAGNOSIS — S20212A Contusion of left front wall of thorax, initial encounter: Principal | ICD-10-CM

## 2016-02-25 DIAGNOSIS — F0280 Dementia in other diseases classified elsewhere without behavioral disturbance: Secondary | ICD-10-CM

## 2016-02-25 LAB — URINALYSIS, MICROSCOPIC (REFLEX)

## 2016-02-25 LAB — CBC WITH DIFFERENTIAL/PLATELET
Basophils Absolute: 0 10*3/uL (ref 0.0–0.1)
Basophils Relative: 0 %
EOS ABS: 0.1 10*3/uL (ref 0.0–0.7)
EOS PCT: 1 %
HCT: 23.9 % — ABNORMAL LOW (ref 39.0–52.0)
Hemoglobin: 8.3 g/dL — ABNORMAL LOW (ref 13.0–17.0)
LYMPHS PCT: 12 %
Lymphs Abs: 1.5 10*3/uL (ref 0.7–4.0)
MCH: 32.8 pg (ref 26.0–34.0)
MCHC: 34.7 g/dL (ref 30.0–36.0)
MCV: 94.5 fL (ref 78.0–100.0)
MONO ABS: 1.6 10*3/uL — AB (ref 0.1–1.0)
Monocytes Relative: 13 %
Neutro Abs: 9.1 10*3/uL — ABNORMAL HIGH (ref 1.7–7.7)
Neutrophils Relative %: 74 %
PLATELETS: 173 10*3/uL (ref 150–400)
RBC: 2.53 MIL/uL — AB (ref 4.22–5.81)
RDW: 12.9 % (ref 11.5–15.5)
WBC: 12.3 10*3/uL — AB (ref 4.0–10.5)

## 2016-02-25 LAB — BLOOD CULTURE ID PANEL (REFLEXED)
Acinetobacter baumannii: NOT DETECTED
CANDIDA PARAPSILOSIS: NOT DETECTED
CANDIDA TROPICALIS: NOT DETECTED
Candida albicans: NOT DETECTED
Candida glabrata: NOT DETECTED
Candida krusei: NOT DETECTED
Enterobacter cloacae complex: NOT DETECTED
Enterobacteriaceae species: NOT DETECTED
Enterococcus species: NOT DETECTED
Escherichia coli: NOT DETECTED
HAEMOPHILUS INFLUENZAE: NOT DETECTED
KLEBSIELLA OXYTOCA: NOT DETECTED
KLEBSIELLA PNEUMONIAE: NOT DETECTED
Listeria monocytogenes: NOT DETECTED
METHICILLIN RESISTANCE: DETECTED — AB
Neisseria meningitidis: NOT DETECTED
PROTEUS SPECIES: NOT DETECTED
Pseudomonas aeruginosa: NOT DETECTED
SERRATIA MARCESCENS: NOT DETECTED
STAPHYLOCOCCUS AUREUS BCID: NOT DETECTED
STAPHYLOCOCCUS SPECIES: DETECTED — AB
STREPTOCOCCUS PYOGENES: NOT DETECTED
Streptococcus agalactiae: NOT DETECTED
Streptococcus pneumoniae: NOT DETECTED
Streptococcus species: NOT DETECTED

## 2016-02-25 LAB — URINALYSIS, ROUTINE W REFLEX MICROSCOPIC
LEUKOCYTES UA: NEGATIVE
Nitrite: NEGATIVE

## 2016-02-25 LAB — COMPREHENSIVE METABOLIC PANEL WITH GFR
ALT: 29 U/L (ref 17–63)
AST: 46 U/L — ABNORMAL HIGH (ref 15–41)
Albumin: 2.9 g/dL — ABNORMAL LOW (ref 3.5–5.0)
Alkaline Phosphatase: 57 U/L (ref 38–126)
Anion gap: 4 — ABNORMAL LOW (ref 5–15)
BUN: 31 mg/dL — ABNORMAL HIGH (ref 6–20)
CO2: 31 mmol/L (ref 22–32)
Calcium: 8 mg/dL — ABNORMAL LOW (ref 8.9–10.3)
Chloride: 105 mmol/L (ref 101–111)
Creatinine, Ser: 0.73 mg/dL (ref 0.61–1.24)
GFR calc Af Amer: >60 mL/min
GFR calc non Af Amer: >60 mL/min
Glucose, Bld: 113 mg/dL — ABNORMAL HIGH (ref 65–99)
Potassium: 3.4 mmol/L — ABNORMAL LOW (ref 3.5–5.1)
Sodium: 140 mmol/L (ref 135–145)
Total Bilirubin: 1.4 mg/dL — ABNORMAL HIGH (ref 0.3–1.2)
Total Protein: 5.7 g/dL — ABNORMAL LOW (ref 6.5–8.1)

## 2016-02-25 LAB — PROTIME-INR
INR: 2.43
Prothrombin Time: 26.8 s — ABNORMAL HIGH (ref 11.4–15.2)

## 2016-02-25 LAB — URINE CULTURE: Culture: NO GROWTH

## 2016-02-25 MED ORDER — ACETAMINOPHEN 500 MG PO TABS
500.0000 mg | ORAL_TABLET | Freq: Four times a day (QID) | ORAL | Status: DC | PRN
  Administered 2016-02-25: 500 mg via ORAL
  Filled 2016-02-25: qty 1

## 2016-02-25 MED ORDER — MEMANTINE HCL 10 MG PO TABS
10.0000 mg | ORAL_TABLET | Freq: Two times a day (BID) | ORAL | Status: DC
  Administered 2016-02-25 – 2016-02-27 (×5): 10 mg via ORAL
  Filled 2016-02-25 (×5): qty 1

## 2016-02-25 MED ORDER — DONEPEZIL HCL 10 MG PO TABS
10.0000 mg | ORAL_TABLET | Freq: Every day | ORAL | Status: DC
  Administered 2016-02-25 – 2016-02-26 (×2): 10 mg via ORAL
  Filled 2016-02-25 (×4): qty 1

## 2016-02-25 MED ORDER — METOPROLOL TARTRATE 25 MG PO TABS
12.5000 mg | ORAL_TABLET | Freq: Every day | ORAL | Status: DC
  Administered 2016-02-25 – 2016-02-27 (×3): 12.5 mg via ORAL
  Filled 2016-02-25 (×3): qty 1

## 2016-02-25 NOTE — Evaluation (Signed)
Physical Therapy Evaluation Patient Details Name: Marvin Wagner MRN: [id number] DOB: [birth year] Today's Date: 02/25/2016   History of Present Illness  83 yo male   from [place] with history of Aortic stenosis, HTN, CAD, memory disturbance. The patient  was admitted 02/24/16 with lethargy, decreased intake, INR 7.51. Patient has history of fall 2 weeks prior. CT chest shows large hematoma of left chest wall.  Clinical Impression  The patient  Was pleasant and able to  Mobilize to sitting and standing with RW and walk 5' to recliner. No family present to discuss prior level and caregiver support. Pt admitted with above diagnosis. Pt currently with functional limitations due to the deficits listed below (see PT Problem List). Pt will benefit from skilled PT to increase their independence and safety with mobility to allow discharge to the venue listed below.  . Anticipate the patient will be able to ambulate farther with assistance.     Follow Up Recommendations SNF;Supervision/Assistance - 24 hour (depends if patient has 24/7 caregivers)    Equipment Recommendations  None recommended by PT    Recommendations for Other Services       Precautions / Restrictions Precautions Precautions: Fall Precaution Comments: left chest wall and left shoulder with noted edema and discomfort to move the arm, although he was able to push up on the RW.      Mobility  Bed Mobility Overal bed mobility: Needs Assistance Bed Mobility: Supine to Sit     Supine to sit: HOB elevated;Mod assist     General bed mobility comments: extra time to prepare to sit up. Bed pad utilized to assist buttocks. the patient used  rail.   Transfers Overall transfer level: Needs assistance Equipment used: Rolling walker (2 wheeled) Transfers: Sit to/from Stand Sit to Stand: Min assist;From elevated surface         General transfer comment: steady assist from bed raised holding onto the RW. Cues for safety.  Stood x 2 trials.   Ambulation/Gait Ambulation/Gait assistance: Min assist Ambulation Distance (Feet): 5 Feet Assistive device: Rolling walker (2 wheeled) Gait Pattern/deviations: Step-to pattern Gait velocity: decreased   General Gait Details: 5' forward and backward with the RW. Then able to walk x 5' to the recliner. Cues to reach back to recliner.  Stairs            Wheelchair Mobility    Modified Rankin (Stroke Patients Only)       Balance Overall balance assessment: Needs assistance;History of Falls Sitting-balance support: Feet supported;No upper extremity supported Sitting balance-Leahy Scale: Fair     Standing balance support: During functional activity;Bilateral upper extremity supported Standing balance-Leahy Scale: Poor                               Pertinent Vitals/Pain Pain Assessment: Faces Faces Pain Scale: Hurts even more Pain Location: Left shoulder with elevation. Pain Descriptors / Indicators: Grimacing;Guarding Pain Intervention(s): Monitored during session    Home Living Family/patient expects to be discharged to:: Assisted living [facility] Living Arrangements: Spouse/significant other               Additional Comments: no family present  and patient was unable to provide what amount assistance that he requires.     Prior Function           Comments: unknown     Hand Dominance        Extremity/Trunk Assessment  Upper Extremity Assessment Upper Extremity Assessment: LUE deficits/detail LUE Deficits / Details: tolerated shoulder elevation to about 80 degrees with assistance, unable to place and hold at that level.    Lower Extremity Assessment Lower Extremity Assessment: Generalized weakness    Cervical / Trunk Assessment Cervical / Trunk Assessment: Kyphotic  Communication   Communication: No difficulties  Cognition Arousal/Alertness: Awake/alert Behavior During Therapy: WFL for tasks  assessed/performed Overall Cognitive Status: No family/caregiver present to determine baseline cognitive functioning Area of Impairment: Orientation;Following commands Orientation Level: Place;Time;Situation     Following Commands: Follows one step commands inconsistently;Follows one step commands with increased time       General Comments: The patient stated that he had neck surgery yesterday due to a fall.    General Comments      Exercises     Assessment/Plan    PT Assessment Patient needs continued PT services  PT Problem List Decreased strength;Decreased activity tolerance;Decreased mobility;Decreased cognition;Decreased knowledge of use of DME;Decreased safety awareness;Decreased knowledge of precautions          PT Treatment Interventions DME instruction;Gait training;Functional mobility training;Therapeutic activities;Therapeutic exercise;Patient/family education    PT Goals (Current goals can be found in the Care Plan section)  Acute Rehab PT Goals Patient Stated Goal: agreed to get into recliner. " I have not been up" PT Goal Formulation: Patient unable to participate in goal setting Time For Goal Achievement: 04/07/16 Potential to Achieve Goals: Fair    Frequency Min 3X/week   Barriers to discharge   no family present    Co-evaluation               End of Session Equipment Utilized During Treatment: Gait belt Activity Tolerance: Patient tolerated treatment well Patient left: in chair;with call bell/phone within reach;with chair alarm set Nurse Communication: Mobility status         Time: [id number] PT Time Calculation (min) (ACUTE ONLY): 26 min   Charges:   PT Evaluation $PT Eval Low Complexity: 1 Procedure PT Treatments $Gait Training: 8-22 mins   PT G Codes:        Claretha Cooper 02/25/2016, 4:44 PM

## 2016-02-25 NOTE — Progress Notes (Signed)
PHARMACY - PHYSICIAN COMMUNICATION CRITICAL VALUE ALERT - BLOOD CULTURE IDENTIFICATION (BCID)  Results for orders placed or performed during the hospital encounter of 02/24/16  Blood Culture ID Panel (Reflexed) (Collected: 02/24/2016  3:09 PM)  Result Value Ref Range   Enterococcus species NOT DETECTED NOT DETECTED   Listeria monocytogenes NOT DETECTED NOT DETECTED   Staphylococcus species DETECTED (A) NOT DETECTED   Staphylococcus aureus NOT DETECTED NOT DETECTED   Methicillin resistance DETECTED (A) NOT DETECTED   Streptococcus species NOT DETECTED NOT DETECTED   Streptococcus agalactiae NOT DETECTED NOT DETECTED   Streptococcus pneumoniae NOT DETECTED NOT DETECTED   Streptococcus pyogenes NOT DETECTED NOT DETECTED   Acinetobacter baumannii NOT DETECTED NOT DETECTED   Enterobacteriaceae species NOT DETECTED NOT DETECTED   Enterobacter cloacae complex NOT DETECTED NOT DETECTED   Escherichia coli NOT DETECTED NOT DETECTED   Klebsiella oxytoca NOT DETECTED NOT DETECTED   Klebsiella pneumoniae NOT DETECTED NOT DETECTED   Proteus species NOT DETECTED NOT DETECTED   Serratia marcescens NOT DETECTED NOT DETECTED   Haemophilus influenzae NOT DETECTED NOT DETECTED   Neisseria meningitidis NOT DETECTED NOT DETECTED   Pseudomonas aeruginosa NOT DETECTED NOT DETECTED   Candida albicans NOT DETECTED NOT DETECTED   Candida glabrata NOT DETECTED NOT DETECTED   Candida krusei NOT DETECTED NOT DETECTED   Candida parapsilosis NOT DETECTED NOT DETECTED   Candida tropicalis NOT DETECTED NOT DETECTED    Name of physician (or Provider) Contacted: Rizwan  Changes to prescribed antibiotics required: none  Peggyann Juba, PharmD, BCPS Pager: [phone] 02/25/2016  6:19 PM

## 2016-02-25 NOTE — Care Management Note (Signed)
Case Management Note  Patient Details  Name: Marvin Wagner MRN: [id number] Date of Birth: 1930/04/18  Subjective/Objective:  83 y.o. M admitted from [facility] where he suffered a fall 2 weeks ago and is now admitted with Hematoma Chest Wall and Acute Blood loss Anemia. Daughter has talked with MD and expressed her concern that he may need to go to a higher level of care. CSW is following for disposition.                   Action/Plan:CM will sign off for now but will be available should additional discharge needs arise or disposition change.    Expected Discharge Date:   (unknown)               Expected Discharge Plan:  Skilled Nursing Facility  In-House Referral:  Clinical Social Work  Discharge planning Services  CM Consult  Post Acute Care Choice:  NA Choice offered to:  Patient, Adult Children  DME Arranged:  N/A DME Agency:     HH Arranged:  NA [place] Agency:  NA  Status of Service:  In process, will continue to follow  If discussed at Long Length of Stay Meetings, dates discussed:    Additional Comments:  Delrae Sawyers, RN 02/25/2016, 2:13 PM

## 2016-02-25 NOTE — NC FL2 (Signed)
[place] LEVEL OF CARE SCREENING TOOL     IDENTIFICATION  Patient Name: Marvin Wagner Birthdate: 1930/03/23 Sex: male Admission Date (Current Location): 02/24/2016  [facility] and [place] Number:  Herbalist and Address:  [facility],  [place] [address], [place]      Provider Number: [id number]  Attending Physician Name and Address:  Debbe Odea, MD  Relative Name and Phone Number:       Current Level of Care: Hospital Recommended Level of Care: [place] Prior Approval Number:    Date Approved/Denied:   PASRR Number:   [id number]  Discharge Plan: SNF    Current Diagnoses: Patient Active Problem List   Diagnosis Date Noted  . Pressure injury of skin 02/25/2016  . Hematoma of chest wall, left, initial encounter 02/24/2016  . Anemia 02/24/2016  . Fever 02/24/2016  . Supratherapeutic INR 02/24/2016  . Dementia 03/04/2015  . Hypotension 12/03/2014  . Weight loss, unintentional 10/17/2013  . History of elevated PSA 04/25/2013  . Encounter for therapeutic drug monitoring 02/26/2013  . Long term current use of anticoagulant therapy 12/19/2012  . Atrial flutter ([place]) 12/14/2012  . S/P AVR 06/13/2012  . Other vitamin B12 deficiency anemia 02/29/2012  . Memory disorder 02/14/2012  . Hx of CABG 07/01/2010  . Hyperlipidemia 07/01/2010  . Benign hypertensive heart disease without heart failure 07/01/2010    Orientation RESPIRATION BLADDER Height & Weight   self Normal Continent Weight: 155 lb (70.3 kg) Height:  5\' 10"  (177.8 cm)  BEHAVIORAL SYMPTOMS/MOOD NEUROLOGICAL BOWEL NUTRITION STATUS      Continent Diet  AMBULATORY STATUS COMMUNICATION OF NEEDS Skin   Extensive Assist Verbally Normal                       Personal Care Assistance Level of Assistance  Bathing, Dressing Bathing Assistance: Limited assistance   Dressing Assistance: Limited assistance     Functional Limitations Info              SPECIAL CARE FACTORS FREQUENCY  PT (By licensed PT), OT (By licensed OT)     PT Frequency: 5 OT Frequency: 5            Contractures      Additional Factors Info  Code Status, Allergies Code Status Info: Fullcode Allergies Info: Penicillins           Current Medications (02/25/2016):  This is the current hospital active medication list Current Facility-Administered Medications  Medication Dose Route Frequency Provider Last Rate Last Dose  . 0.9 %  sodium chloride infusion  10 mL/hr Intravenous Once Davonna Belling, MD      . 0.9 %  sodium chloride infusion   Intravenous Continuous Reubin Milan, MD 75 mL/hr at 02/24/16 2145    . acetaminophen (TYLENOL) tablet 500 mg  500 mg Oral Q6H PRN Reubin Milan, MD   500 mg at 02/25/16 0953  . ceFEPIme (MAXIPIME) 1 g in dextrose 5 % 50 mL IVPB  1 g Intravenous Q12H Christine E Shade, RPH   1 g at 02/25/16 0602  . donepezil (ARICEPT) tablet 10 mg  10 mg Oral QHS Reubin Milan, MD      . feeding supplement (ENSURE ENLIVE) (ENSURE ENLIVE) liquid 237 mL  237 mL Oral BID BM Reubin Milan, MD   237 mL at 02/25/16 1000  . memantine (NAMENDA) tablet 10 mg  10 mg Oral BID Gerri Lins  Olevia Bowens, MD   10 mg at 02/25/16 0953  . metoprolol tartrate (LOPRESSOR) tablet 12.5 mg  12.5 mg Oral Daily Reubin Milan, MD   12.5 mg at 02/25/16 0953  . sodium chloride flush (NS) 0.9 % injection 3 mL  3 mL Intravenous Q12H Reubin Milan, MD      . vancomycin [facility]) 500 mg in sodium chloride 0.9 % 100 mL IVPB  500 mg Intravenous Q12H Randa Spike, RPH   500 mg at 02/25/16 [id number]     Discharge Medications: Please see discharge summary for a list of discharge medications.  Relevant Imaging Results:  Relevant Lab Results:   Additional Information SSN: [id number]  Standley Brooking, LCSW

## 2016-02-25 NOTE — Progress Notes (Addendum)
PROGRESS NOTE    Marvin Wagner   [id number]  DOB: [birth year]  DOA: 02/24/2016 PCP: Eulas Post, MD   Brief Narrative:  Marvin Wagner is a 83 y.o. male with medical history significant of aortic insufficiency, aortic stenosis, CAD, hyperlipidemia, hypertension, memory disturbance who is being brought to the emergency department from [facility] assisted living for evaluation of lethargy, decreased appetite and fluid intake. The patient had a fall about two weeks ago and has an extensive area of ecchymosis on his left lower chest wall and right upper flank areas. INR noted to be 7.51. He has chest wall hematomas noted on CT of the chest. Vit K and FFP given in ER.   Subjective: Pain in left upper chest wall. No other complaints.   Assessment & Plan:   Principal Problem:   Hematoma of chest wall, left with Acute blood loss anemia - traumatic due to fall in setting of elevated INR -Hb drop 13.2 >> 8.3 today - FFP and Vit K given and INR now down to 2.43  Active Problems:   Fever/ UTI with hematuria - temp 101 on admission- he was on Keflex as outpt for a UTI - now on Vanc and Cefepime- f/u cultures and continue current antibiotics   Hypokalemia - replacing   A-fib - hold Coumadin due to chest wall hematomas - cont Lopressor    Hx of CABG    Hyperlipidemia    Dementia - very poor short term memory- cont Namenda and Aricept   DVT prophylaxis: SCDs Code Status: Full code Family Communication: daughter Disposition Plan: likely will need SNF Consultants:    Procedures:    Antimicrobials:  Anti-infectives    Start     Dose/Rate Route Frequency Ordered Stop   02/25/16 0600  ceFEPIme (MAXIPIME) 1 g in dextrose 5 % 50 mL IVPB     1 g 100 mL/hr over 30 Minutes Intravenous Every 12 hours 02/24/16 1607     02/25/16 0600  vancomycin (VANCOCIN) 500 mg in sodium chloride 0.9 % 100 mL IVPB     500 mg 100 mL/hr over 60 Minutes Intravenous Every 12 hours  02/24/16 1607     02/24/16 1615  ceFEPIme (MAXIPIME) 1 g in dextrose 5 % 50 mL IVPB     1 g 100 mL/hr over 30 Minutes Intravenous STAT 02/24/16 1605 02/24/16 1722   02/24/16 1615  vancomycin (VANCOCIN) IVPB 1000 mg/200 mL premix     1,000 mg 200 mL/hr over 60 Minutes Intravenous STAT 02/24/16 1605 02/24/16 2013       Objective: Vitals:   02/25/16 0055 02/25/16 0255 02/25/16 0330 02/25/16 0608  BP: 138/73 (!) 146/70 (!) 141/61 (!) 135/59  Pulse: 93 90 87 99  Resp: 16 16 16 16   Temp: 99.3 F (37.4 C) 97.8 F (36.6 C) 98 F (36.7 C) 97.9 F (36.6 C)  TempSrc: Axillary Oral Oral Axillary  SpO2: 99% 98% 99% 96%  Weight:      Height:        Intake/Output Summary (Last 24 hours) at 02/25/16 1300 Last data filed at 02/25/16 1000  Gross per 24 hour  Intake          2093.75 ml  Output              175 ml  Net          1918.75 ml   Filed Weights   02/24/16 1256  Weight: 70.3 kg (155 lb)    Examination: General  exam: Appears comfortable  HEENT: PERRLA, oral mucosa moist, no sclera icterus or thrush Chest wall: 5 x 5 in area of swelling and tenderness in left upper chest wall, extensive ecchymosis in left lower chest wall.  Respiratory system: Clear to auscultation. Respiratory effort normal. Cardiovascular system: S1 & S2 heard, RRR.  No murmurs  Gastrointestinal system: Abdomen soft, non-tender, nondistended. Normal bowel sound. No organomegaly Central nervous system: Alert and oriented. No focal neurological deficits. Extremities: No cyanosis, clubbing or edema Skin: No rashes or ulcers Psychiatry:  Mood & affect appropriate.     Data Reviewed: I have personally reviewed following labs and imaging studies  CBC:  Recent Labs Lab 02/21/16 1145 02/24/16 1436  WBC 15.9* 17.9*  NEUTROABS  --  14.8*  HGB 13.4 10.5*  HCT 38.0* 30.3*  MCV 92.2 94.1  PLT 239 [id number]   Basic Metabolic Panel:  Recent Labs Lab 02/21/16 1145 02/24/16 1436 02/25/16 0818  NA 140 138 140   K 4.2 4.1 3.4*  CL 104 104 105  CO2 27 27 31   GLUCOSE 109* 129* 113*  BUN 23* 41* 31*  CREATININE 0.94 0.98 0.73  CALCIUM 8.6* 7.8* 8.0*   GFR: Estimated Creatinine Clearance: 69.6 mL/min (by C-G formula based on SCr of 0.73 mg/dL). Liver Function Tests:  Recent Labs Lab 02/24/16 1436 02/25/16 0818  AST 27 46*  ALT 18 29  ALKPHOS 58 57  BILITOT 1.0 1.4*  PROT 5.9* 5.7*  ALBUMIN 3.1* 2.9*   No results for input(s): LIPASE, AMYLASE in the last 168 hours. No results for input(s): AMMONIA in the last 168 hours. Coagulation Profile:  Recent Labs Lab 02/21/16 1145 02/24/16 1436 02/25/16 0818  INR 6.45* 7.51* 2.43   Cardiac Enzymes: No results for input(s): CKTOTAL, CKMB, CKMBINDEX, TROPONINI in the last 168 hours. BNP (last 3 results) No results for input(s): PROBNP in the last 8760 hours. HbA1C: No results for input(s): HGBA1C in the last 72 hours. CBG: No results for input(s): GLUCAP in the last 168 hours. Lipid Profile: No results for input(s): CHOL, HDL, LDLCALC, TRIG, CHOLHDL, LDLDIRECT in the last 72 hours. Thyroid Function Tests: No results for input(s): TSH, T4TOTAL, FREET4, T3FREE, THYROIDAB in the last 72 hours. Anemia Panel: No results for input(s): VITAMINB12, FOLATE, FERRITIN, TIBC, IRON, RETICCTPCT in the last 72 hours. Urine analysis:    Component Value Date/Time   COLORURINE RED (A) 02/25/2016 0301   APPEARANCEUR TURBID (A) 02/25/2016 0301   LABSPEC  02/25/2016 0301    TEST NOT REPORTED DUE TO COLOR INTERFERENCE OF URINE PIGMENT   PHURINE  02/25/2016 0301    TEST NOT REPORTED DUE TO COLOR INTERFERENCE OF URINE PIGMENT   GLUCOSEU (A) 02/25/2016 0301    TEST NOT REPORTED DUE TO COLOR INTERFERENCE OF URINE PIGMENT   HGBUR (A) 02/25/2016 0301    TEST NOT REPORTED DUE TO COLOR INTERFERENCE OF URINE PIGMENT   BILIRUBINUR (A) 02/25/2016 0301    TEST NOT REPORTED DUE TO COLOR INTERFERENCE OF URINE PIGMENT   KETONESUR (A) 02/25/2016 0301    TEST NOT  REPORTED DUE TO COLOR INTERFERENCE OF URINE PIGMENT   PROTEINUR (A) 02/25/2016 0301    TEST NOT REPORTED DUE TO COLOR INTERFERENCE OF URINE PIGMENT   UROBILINOGEN 0.2 12/15/2008 1405   NITRITE NEGATIVE 02/25/2016 0301   LEUKOCYTESUR NEGATIVE 02/25/2016 0301   Sepsis Labs: @LABRCNTIP (procalcitonin:4,lacticidven:4) ) Recent Results (from the past 240 hour(s))  MRSA PCR Screening     Status: None   Collection Time: 02/24/16  9:27 PM  Result Value Ref Range Status   MRSA by PCR NEGATIVE NEGATIVE Final    Comment:        The GeneXpert MRSA Assay (FDA approved for NASAL specimens only), is one component of a comprehensive MRSA colonization surveillance program. It is not intended to diagnose MRSA infection nor to guide or monitor treatment for MRSA infections.          Radiology Studies: Ct Head Wo Contrast  Result Date: 02/24/2016 CLINICAL DATA:  Lethargy. Patient fell 2 weeks ago hitting left side. EXAM: CT HEAD WITHOUT CONTRAST TECHNIQUE: Contiguous axial images were obtained from the base of the skull through the vertex without intravenous contrast. COMPARISON:  02/06/2016 CT FINDINGS: BRAIN: There is mild-to-moderate sulcal and ventricular prominence consistent with superficial and central atrophy. No intraparenchymal hemorrhage, mass effect nor midline shift. Periventricular and subcortical white matter hypodensities consistent with chronic small vessel ischemic disease are identified. No acute large vascular territory infarcts. No abnormal extra-axial fluid collections. Basal cisterns are not effaced and midline. VASCULAR: Moderate calcific atherosclerosis of the carotid siphons. SKULL: No skull fracture. No significant scalp soft tissue swelling. SINUSES/ORBITS: The mastoid air-cells are clear. The included paranasal sinuses are well-aerated.The included ocular globes and orbital contents are non-suspicious. OTHER: None. IMPRESSION: Mild to moderate superficial and central  atrophy with chronic small vessel ischemic disease of periventricular and subcortical white matter. No acute intracranial abnormality. Electronically Signed   By: Ashley Royalty M.D.   On: 02/24/2016 17:48   Ct Chest W Contrast  Result Date: 02/24/2016 CLINICAL DATA:  Pt comes from [facility] (Assisted Living[facility] Staff sts pt was very lethargic today, pt dry, very thirsty per EMS. Chronic back pain per EMS. Pt had fall two weeks ago hit left side EXAM: CT CHEST, ABDOMEN, AND PELVIS WITH CONTRAST TECHNIQUE: Multidetector CT imaging of the chest, abdomen and pelvis was performed following the standard protocol during bolus administration of intravenous contrast. CONTRAST:  171mL ISOVUE-300 IOPAMIDOL (ISOVUE-300) INJECTION 61% COMPARISON:  None. FINDINGS: CT CHEST FINDINGS Cardiovascular: No significant vascular findings. Normal heart size. No pericardial effusion. Thoracic aorta is normal in caliber. No thoracic aortic dissection. Thoracic aortic atherosclerosis. Prior CABG. Mediastinum/Nodes: No enlarged mediastinal, hilar, or axillary lymph nodes. Thyroid gland, trachea, and esophagus demonstrate no significant findings. Lungs/Pleura: Small left pleural effusion. No focal consolidation or pneumothorax. Musculoskeletal: No acute osseous abnormality. Large heterogeneous mass measuring 9.5 x 7.6 x 13.4 cm deep to the left pectoralis major muscle and may be centered within the left pectoralis minor muscle. There is expansion of the intercostal muscle between the left second and third rib adjacent to the mass resulting in a convex margin along the pleural surface and a similar of fact on the adjacent third and fourth intercostal space. There is expansion of the left serratus anterior muscle with adjacent inflammatory changes. There is expansion of the left latissimus muscle with surrounding inflammatory changes. Small amount hemorrhagic fluid tracks along the left lateral chest wall and into the left lateral  abdominal wall. CT ABDOMEN PELVIS FINDINGS Hepatobiliary: No focal liver abnormality is seen. No gallstones, gallbladder wall thickening, or biliary dilatation. Pancreas: Unremarkable. No pancreatic ductal dilatation or surrounding inflammatory changes. Spleen: Normal in size without focal abnormality. Adrenals/Urinary Tract: Adrenal glands are unremarkable. Kidneys are normal, without renal calculi, focal lesion, or hydronephrosis. Bladder is unremarkable. Stomach/Bowel: Stomach is within normal limits. Extensive sigmoid diverticulosis without evidence of diverticulitis. No evidence of bowel wall thickening, distention, or inflammatory changes. Vascular/Lymphatic: Normal caliber  abdominal aorta with atherosclerosis. Reproductive: Prostate enlargement impressing on the bladder base. Other: No abdominal wall hernia or abnormality. No abdominopelvic ascites. Musculoskeletal: Severe degenerative disc disease with disc height loss at L4-5 and L5-S1 with bilateral facet arthropathy. Generalized osteopenia. No acute osseous abnormality. IMPRESSION: 1. Large heterogeneous mass measuring 9.5 x 7.6 x 13.4 cm deep to the left pectoralis major muscle and may be centered within the left pectoralis minor muscle likely reflecting a large intramuscular hematoma. Expansion of the intercostal muscle between the left second and third rib adjacent to the mass resulting in a convex margin along the pleural surface and a similar affect on the adjacent third and fourth intercostal muscle likely also reflecting an intramuscular hematoma. 2. Expansion of the left serratus anterior and latissimus muscles with adjacent inflammatory changes consistent with intramuscular hematomas. 3. Close clinical follow-up is recommended to document complete resolution. In the absence of resolution, follow-up imaging is recommended to evaluate for in the underlying pathology. Electronically Signed   By: Kathreen Devoid   On: 02/24/2016 17:54   Ct Abdomen  Pelvis W Contrast  Result Date: 02/24/2016 CLINICAL DATA:  Pt comes from [facility] ([place]). Staff sts pt was very lethargic today, pt dry, very thirsty per EMS. Chronic back pain per EMS. Pt had fall two weeks ago hit left side EXAM: CT CHEST, ABDOMEN, AND PELVIS WITH CONTRAST TECHNIQUE: Multidetector CT imaging of the chest, abdomen and pelvis was performed following the standard protocol during bolus administration of intravenous contrast. CONTRAST:  142mL ISOVUE-300 IOPAMIDOL (ISOVUE-300) INJECTION 61% COMPARISON:  None. FINDINGS: CT CHEST FINDINGS Cardiovascular: No significant vascular findings. Normal heart size. No pericardial effusion. Thoracic aorta is normal in caliber. No thoracic aortic dissection. Thoracic aortic atherosclerosis. Prior CABG. Mediastinum/Nodes: No enlarged mediastinal, hilar, or axillary lymph nodes. Thyroid gland, trachea, and esophagus demonstrate no significant findings. Lungs/Pleura: Small left pleural effusion. No focal consolidation or pneumothorax. Musculoskeletal: No acute osseous abnormality. Large heterogeneous mass measuring 9.5 x 7.6 x 13.4 cm deep to the left pectoralis major muscle and may be centered within the left pectoralis minor muscle. There is expansion of the intercostal muscle between the left second and third rib adjacent to the mass resulting in a convex margin along the pleural surface and a similar of fact on the adjacent third and fourth intercostal space. There is expansion of the left serratus anterior muscle with adjacent inflammatory changes. There is expansion of the left latissimus muscle with surrounding inflammatory changes. Small amount hemorrhagic fluid tracks along the left lateral chest wall and into the left lateral abdominal wall. CT ABDOMEN PELVIS FINDINGS Hepatobiliary: No focal liver abnormality is seen. No gallstones, gallbladder wall thickening, or biliary dilatation. Pancreas: Unremarkable. No pancreatic ductal dilatation  or surrounding inflammatory changes. Spleen: Normal in size without focal abnormality. Adrenals/Urinary Tract: Adrenal glands are unremarkable. Kidneys are normal, without renal calculi, focal lesion, or hydronephrosis. Bladder is unremarkable. Stomach/Bowel: Stomach is within normal limits. Extensive sigmoid diverticulosis without evidence of diverticulitis. No evidence of bowel wall thickening, distention, or inflammatory changes. Vascular/Lymphatic: Normal caliber abdominal aorta with atherosclerosis. Reproductive: Prostate enlargement impressing on the bladder base. Other: No abdominal wall hernia or abnormality. No abdominopelvic ascites. Musculoskeletal: Severe degenerative disc disease with disc height loss at L4-5 and L5-S1 with bilateral facet arthropathy. Generalized osteopenia. No acute osseous abnormality. IMPRESSION: 1. Large heterogeneous mass measuring 9.5 x 7.6 x 13.4 cm deep to the left pectoralis major muscle and may be centered within the left pectoralis minor muscle likely  reflecting a large intramuscular hematoma. Expansion of the intercostal muscle between the left second and third rib adjacent to the mass resulting in a convex margin along the pleural surface and a similar affect on the adjacent third and fourth intercostal muscle likely also reflecting an intramuscular hematoma. 2. Expansion of the left serratus anterior and latissimus muscles with adjacent inflammatory changes consistent with intramuscular hematomas. 3. Close clinical follow-up is recommended to document complete resolution. In the absence of resolution, follow-up imaging is recommended to evaluate for in the underlying pathology. Electronically Signed   By: Kathreen Devoid   On: 02/24/2016 17:54      Scheduled Meds: . sodium chloride  10 mL/hr Intravenous Once  . ceFEPime (MAXIPIME) IV  1 g Intravenous Q12H  . donepezil  10 mg Oral QHS  . feeding supplement (ENSURE ENLIVE)  237 mL Oral BID BM  . memantine  10 mg Oral  BID  . metoprolol tartrate  12.5 mg Oral Daily  . sodium chloride flush  3 mL Intravenous Q12H  . vancomycin  500 mg Intravenous Q12H   Continuous Infusions: . sodium chloride 75 mL/hr at 02/24/16 2145     LOS: 1 day    Time spent in minutes: 43    [place], MD [facility] Pager: [email or web address] Password [email or web address] 02/25/2016, 1:00 PM

## 2016-02-25 NOTE — Progress Notes (Signed)
Pt urine dark, noted blood on bed pad. Daughter at bedside states "I've never seen his urine that dark." Pt does not report difficulty urinating or pain with urination. Urine sample collected, on-call paged and order placed for repeat urinalysis.  Will continue to monitor closely.

## 2016-02-26 ENCOUNTER — Ambulatory Visit: Payer: Medicare Other | Admitting: Family Medicine

## 2016-02-26 ENCOUNTER — Ambulatory Visit: Payer: Medicare Other

## 2016-02-26 DIAGNOSIS — R319 Hematuria, unspecified: Secondary | ICD-10-CM

## 2016-02-26 DIAGNOSIS — N390 Urinary tract infection, site not specified: Secondary | ICD-10-CM

## 2016-02-26 DIAGNOSIS — R791 Abnormal coagulation profile: Secondary | ICD-10-CM

## 2016-02-26 LAB — HEMOGLOBIN AND HEMATOCRIT, BLOOD
HCT: 29.3 % — ABNORMAL LOW (ref 39.0–52.0)
Hemoglobin: 9.9 g/dL — ABNORMAL LOW (ref 13.0–17.0)

## 2016-02-26 LAB — CBC WITH DIFFERENTIAL/PLATELET
BASOS ABS: 0 10*3/uL (ref 0.0–0.1)
Basophils Relative: 0 %
Eosinophils Absolute: 0.2 10*3/uL (ref 0.0–0.7)
Eosinophils Relative: 2 %
HEMATOCRIT: 21.1 % — AB (ref 39.0–52.0)
Hemoglobin: 7.4 g/dL — ABNORMAL LOW (ref 13.0–17.0)
LYMPHS ABS: 1.3 10*3/uL (ref 0.7–4.0)
LYMPHS PCT: 13 %
MCH: 33.3 pg (ref 26.0–34.0)
MCHC: 35.1 g/dL (ref 30.0–36.0)
MCV: 95 fL (ref 78.0–100.0)
MONO ABS: 1.2 10*3/uL — AB (ref 0.1–1.0)
Monocytes Relative: 12 %
NEUTROS ABS: 7.3 10*3/uL (ref 1.7–7.7)
Neutrophils Relative %: 73 %
Platelets: 160 10*3/uL (ref 150–400)
RBC: 2.22 MIL/uL — AB (ref 4.22–5.81)
RDW: 12.9 % (ref 11.5–15.5)
WBC: 10 10*3/uL (ref 4.0–10.5)

## 2016-02-26 LAB — COMPREHENSIVE METABOLIC PANEL
ALBUMIN: 2.5 g/dL — AB (ref 3.5–5.0)
ALK PHOS: 55 U/L (ref 38–126)
ALT: 73 U/L — AB (ref 17–63)
AST: 103 U/L — ABNORMAL HIGH (ref 15–41)
Anion gap: 5 (ref 5–15)
BILIRUBIN TOTAL: 1.2 mg/dL (ref 0.3–1.2)
BUN: 27 mg/dL — AB (ref 6–20)
CALCIUM: 7.7 mg/dL — AB (ref 8.9–10.3)
CO2: 30 mmol/L (ref 22–32)
CREATININE: 0.64 mg/dL (ref 0.61–1.24)
Chloride: 107 mmol/L (ref 101–111)
GFR calc Af Amer: >60 mL/min (ref >60)
GFR calc non Af Amer: >60 mL/min (ref >60)
GLUCOSE: 101 mg/dL — AB (ref 65–99)
Potassium: 3.5 mmol/L (ref 3.5–5.1)
SODIUM: 142 mmol/L (ref 135–145)
TOTAL PROTEIN: 5.3 g/dL — AB (ref 6.5–8.1)

## 2016-02-26 LAB — PREPARE FRESH FROZEN PLASMA
BLOOD PRODUCT EXPIRATION DATE: 201801292359
Blood Product Expiration Date: 201801292359
ISSUE DATE / TIME: 201801250303
ISSUE DATE / TIME: 201801250028
UNIT TYPE AND RH: 5100
Unit Type and Rh: 5100

## 2016-02-26 LAB — PROTIME-INR
INR: 2.38
Prothrombin Time: 26.4 seconds — ABNORMAL HIGH (ref 11.4–15.2)

## 2016-02-26 LAB — PREPARE RBC (CROSSMATCH)

## 2016-02-26 MED ORDER — SODIUM CHLORIDE 0.9 % IV SOLN
Freq: Once | INTRAVENOUS | Status: AC
  Administered 2016-02-26: 13:00:00 via INTRAVENOUS

## 2016-02-26 NOTE — Progress Notes (Signed)
PROGRESS NOTE    Marvin Wagner   [id number]  DOB: 1930/12/12  DOA: 02/24/2016 PCP: Eulas Post, MD   Brief Narrative:  Marvin Wagner is a 83 y.o. male with medical history significant of aortic insufficiency, aortic stenosis, CAD, hyperlipidemia, hypertension, memory disturbance who is being brought to the emergency department from [facility] assisted living for evaluation of lethargy, decreased appetite and fluid intake. The patient had a fall about two weeks ago and has an extensive area of ecchymosis on his left lower chest wall and right upper flank areas. INR noted to be 7.51. He has chest wall hematomas noted on CT of the chest. Vit K and FFP given in ER.   Subjective:   No complaints today.   Assessment & Plan:   Principal Problem:   Hematoma of chest wall, left with Acute blood loss anemia - traumatic due to fall in setting of elevated INR -Hb drop 13.2 >> 8.3>> 7.4- will transfuse 2 u PRBC today - FFP and Vit K given and INR now down < 2.5  Active Problems:   Fever/ UTI with hematuria - temp 101 on admission- he was on Keflex as outpt for a UTI - now on Vanc and Cefepime- blood cultures 1 / 2 sets coag neg staph - U culture- no growth  Hypokalemia - replacing   A-fib - hold Coumadin due to chest wall hematomas - cont Lopressor    Hx of CABG    Hyperlipidemia    Dementia - very poor short term memory- cont Namenda and Aricept   DVT prophylaxis: SCDs Code Status: Full code Family Communication: daughter Disposition Plan: likely will need SNF Consultants:    Procedures:    Antimicrobials:  Anti-infectives    Start     Dose/Rate Route Frequency Ordered Stop   02/25/16 0600  ceFEPIme (MAXIPIME) 1 g in dextrose 5 % 50 mL IVPB     1 g 100 mL/hr over 30 Minutes Intravenous Every 12 hours 02/24/16 1607     02/25/16 0600  vancomycin (VANCOCIN) 500 mg in sodium chloride 0.9 % 100 mL IVPB     500 mg 100 mL/hr over 60 Minutes Intravenous  Every 12 hours 02/24/16 1607     02/24/16 1615  ceFEPIme (MAXIPIME) 1 g in dextrose 5 % 50 mL IVPB     1 g 100 mL/hr over 30 Minutes Intravenous STAT 02/24/16 1605 02/24/16 1722   02/24/16 1615  vancomycin (VANCOCIN) IVPB 1000 mg/200 mL premix     1,000 mg 200 mL/hr over 60 Minutes Intravenous STAT 02/24/16 1605 02/24/16 2013       Objective: Vitals:   02/25/16 1352 02/25/16 2029 02/26/16 0559 02/26/16 1116  BP: (!) 145/58 139/63 (!) 135/58 (!) 130/44  Pulse: 93 93 87 90  Resp: 18  18 20   Temp: 98.1 F (36.7 C)  98.2 F (36.8 C) 98.4 F (36.9 C)  TempSrc: Oral Oral Oral Oral  SpO2: 100% 94% 97% 99%  Weight:      Height:        Intake/Output Summary (Last 24 hours) at 02/26/16 1239 Last data filed at 02/26/16 0600  Gross per 24 hour  Intake             1945 ml  Output              250 ml  Net             1695 ml   Filed Weights   02/24/16  1256  Weight: 70.3 kg (155 lb)    Examination: General exam: Appears comfortable  HEENT: PERRLA, oral mucosa moist, no sclera icterus or thrush Chest wall: 5 x 5 in area of swelling and tenderness in left upper chest wall, extensive ecchymosis in left lower chest wall.  Respiratory system: Clear to auscultation. Respiratory effort normal. Cardiovascular system: S1 & S2 heard, RRR.  No murmurs  Gastrointestinal system: Abdomen soft, non-tender, nondistended. Normal bowel sound. No organomegaly Central nervous system: Alert and oriented. No focal neurological deficits. Extremities: No cyanosis, clubbing or edema Skin: No rashes or ulcers Psychiatry:  Mood & affect appropriate.     Data Reviewed: I have personally reviewed following labs and imaging studies  CBC:  Recent Labs Lab 02/21/16 1145 02/24/16 1436 02/25/16 0818 02/26/16 0547  WBC 15.9* 17.9* 12.3* 10.0  NEUTROABS  --  14.8* 9.1* 7.3  HGB 13.4 10.5* 8.3* 7.4*  HCT 38.0* 30.3* 23.9* 21.1*  MCV 92.2 94.1 94.5 95.0  PLT 239 221 173 0000000   Basic Metabolic  Panel:  Recent Labs Lab 02/21/16 1145 02/24/16 1436 02/25/16 0818 02/26/16 0547  NA 140 138 140 142  K 4.2 4.1 3.4* 3.5  CL 104 104 105 107  CO2 27 27 31 30   GLUCOSE 109* 129* 113* 101*  BUN 23* 41* 31* 27*  CREATININE 0.94 0.98 0.73 0.64  CALCIUM 8.6* 7.8* 8.0* 7.7*   GFR: Estimated Creatinine Clearance: 69.6 mL/min (by C-G formula based on SCr of 0.64 mg/dL). Liver Function Tests:  Recent Labs Lab 02/24/16 1436 02/25/16 0818 02/26/16 0547  AST 27 46* 103*  ALT 18 29 73*  ALKPHOS 58 57 55  BILITOT 1.0 1.4* 1.2  PROT 5.9* 5.7* 5.3*  ALBUMIN 3.1* 2.9* 2.5*   No results for input(s): LIPASE, AMYLASE in the last 168 hours. No results for input(s): AMMONIA in the last 168 hours. Coagulation Profile:  Recent Labs Lab 02/21/16 1145 02/24/16 1436 02/25/16 0818 02/26/16 0547  INR 6.45* 7.51* 2.43 2.38   Cardiac Enzymes: No results for input(s): CKTOTAL, CKMB, CKMBINDEX, TROPONINI in the last 168 hours. BNP (last 3 results) No results for input(s): PROBNP in the last 8760 hours. HbA1C: No results for input(s): HGBA1C in the last 72 hours. CBG: No results for input(s): GLUCAP in the last 168 hours. Lipid Profile: No results for input(s): CHOL, HDL, LDLCALC, TRIG, CHOLHDL, LDLDIRECT in the last 72 hours. Thyroid Function Tests: No results for input(s): TSH, T4TOTAL, FREET4, T3FREE, THYROIDAB in the last 72 hours. Anemia Panel: No results for input(s): VITAMINB12, FOLATE, FERRITIN, TIBC, IRON, RETICCTPCT in the last 72 hours. Urine analysis:    Component Value Date/Time   COLORURINE RED (A) 02/25/2016 0301   APPEARANCEUR TURBID (A) 02/25/2016 0301   LABSPEC  02/25/2016 0301    TEST NOT REPORTED DUE TO COLOR INTERFERENCE OF URINE PIGMENT   PHURINE  02/25/2016 0301    TEST NOT REPORTED DUE TO COLOR INTERFERENCE OF URINE PIGMENT   GLUCOSEU (A) 02/25/2016 0301    TEST NOT REPORTED DUE TO COLOR INTERFERENCE OF URINE PIGMENT   HGBUR (A) 02/25/2016 0301    TEST NOT  REPORTED DUE TO COLOR INTERFERENCE OF URINE PIGMENT   BILIRUBINUR (A) 02/25/2016 0301    TEST NOT REPORTED DUE TO COLOR INTERFERENCE OF URINE PIGMENT   KETONESUR (A) 02/25/2016 0301    TEST NOT REPORTED DUE TO COLOR INTERFERENCE OF URINE PIGMENT   PROTEINUR (A) 02/25/2016 0301    TEST NOT REPORTED DUE TO COLOR INTERFERENCE  OF URINE PIGMENT   UROBILINOGEN 0.2 12/15/2008 1405   NITRITE NEGATIVE 02/25/2016 0301   LEUKOCYTESUR NEGATIVE 02/25/2016 0301   Sepsis Labs: @LABRCNTIP (procalcitonin:4,lacticidven:4) ) Recent Results (from the past 240 hour(s))  Urine culture     Status: None   Collection Time: 02/24/16  2:41 PM  Result Value Ref Range Status   Specimen Description URINE, RANDOM  Final   Special Requests NONE  Final   Culture   Final    NO GROWTH Performed at [facility], [place] [address], [place], [place] [address]    Report Status 02/25/2016 FINAL  Final  Culture, blood (routine x 2)     Status: Abnormal (Preliminary result)   Collection Time: 02/24/16  3:09 PM  Result Value Ref Range Status   Specimen Description BLOOD RIGHT ANTECUBITAL  Final   Special Requests BOTTLES DRAWN AEROBIC AND ANAEROBIC 5CC  Final   Culture  Setup Time   Final    GRAM POSITIVE COCCI IN CLUSTERS IN BOTH AEROBIC AND ANAEROBIC BOTTLES CRITICAL RESULT CALLED TO, READ BACK BY AND VERIFIED WITH: Winfred Leeds [id number] Performed at [facility], [place] [address], [place], [place] [address]    Culture STAPHYLOCOCCUS SPECIES (COAGULASE NEGATIVE) (A)  Final   Report Status PENDING  Incomplete  Blood Culture ID Panel (Reflexed)     Status: Abnormal   Collection Time: 02/24/16  3:09 PM  Result Value Ref Range Status   Enterococcus species NOT DETECTED NOT DETECTED Final   Listeria monocytogenes NOT DETECTED NOT DETECTED Final   Staphylococcus species DETECTED (A) NOT DETECTED Final    Comment: CRITICAL RESULT CALLED TO, READ BACK BY AND VERIFIED WITH: PHARMD E WILLIAMSON CO:[id number]  1805 MLM    Staphylococcus aureus NOT DETECTED NOT DETECTED Final   Methicillin resistance DETECTED (A) NOT DETECTED Final    Comment: CRITICAL RESULT CALLED TO, READ BACK BY AND VERIFIED WITH: PHRAMD E WILLIAMSON [id number] 1805 MLM    Streptococcus species NOT DETECTED NOT DETECTED Final   Streptococcus agalactiae NOT DETECTED NOT DETECTED Final   Streptococcus pneumoniae NOT DETECTED NOT DETECTED Final   Streptococcus pyogenes NOT DETECTED NOT DETECTED Final   Acinetobacter baumannii NOT DETECTED NOT DETECTED Final   Enterobacteriaceae species NOT DETECTED NOT DETECTED Final   Enterobacter cloacae complex NOT DETECTED NOT DETECTED Final   Escherichia coli NOT DETECTED NOT DETECTED Final   Klebsiella oxytoca NOT DETECTED NOT DETECTED Final   Klebsiella pneumoniae NOT DETECTED NOT DETECTED Final   Proteus species NOT DETECTED NOT DETECTED Final   Serratia marcescens NOT DETECTED NOT DETECTED Final   Haemophilus influenzae NOT DETECTED NOT DETECTED Final   Neisseria meningitidis NOT DETECTED NOT DETECTED Final   Pseudomonas aeruginosa NOT DETECTED NOT DETECTED Final   Candida albicans NOT DETECTED NOT DETECTED Final   Candida glabrata NOT DETECTED NOT DETECTED Final   Candida krusei NOT DETECTED NOT DETECTED Final   Candida parapsilosis NOT DETECTED NOT DETECTED Final   Candida tropicalis NOT DETECTED NOT DETECTED Final    Comment: Performed at [facility], [address], [place], [place] [address]  Culture, blood (routine x 2)     Status: None (Preliminary result)   Collection Time: 02/24/16  3:21 PM  Result Value Ref Range Status   Specimen Description BLOOD LEFT ANTECUBITAL  Final   Special Requests BOTTLES DRAWN AEROBIC AND ANAEROBIC 5CC  Final   Culture   Final    NO GROWTH 2 DAYS Performed at  [facility], [place] [address], [place], [place] [address]    Report Status PENDING  Incomplete  MRSA PCR Screening     Status: None   Collection Time: 02/24/16  9:27 PM    Result Value Ref Range Status   MRSA by PCR NEGATIVE NEGATIVE Final    Comment:        The GeneXpert MRSA Assay (FDA approved for NASAL specimens only), is one component of a comprehensive MRSA colonization surveillance program. It is not intended to diagnose MRSA infection nor to guide or monitor treatment for MRSA infections.          Radiology Studies: Ct Head Wo Contrast  Result Date: 02/24/2016 CLINICAL DATA:  Lethargy. Patient fell 2 weeks ago hitting left side. EXAM: CT HEAD WITHOUT CONTRAST TECHNIQUE: Contiguous axial images were obtained from the base of the skull through the vertex without intravenous contrast. COMPARISON:  02/06/2016 CT FINDINGS: BRAIN: There is mild-to-moderate sulcal and ventricular prominence consistent with superficial and central atrophy. No intraparenchymal hemorrhage, mass effect nor midline shift. Periventricular and subcortical white matter hypodensities consistent with chronic small vessel ischemic disease are identified. No acute large vascular territory infarcts. No abnormal extra-axial fluid collections. Basal cisterns are not effaced and midline. VASCULAR: Moderate calcific atherosclerosis of the carotid siphons. SKULL: No skull fracture. No significant scalp soft tissue swelling. SINUSES/ORBITS: The mastoid air-cells are clear. The included paranasal sinuses are well-aerated.The included ocular globes and orbital contents are non-suspicious. OTHER: None. IMPRESSION: Mild to moderate superficial and central atrophy with chronic small vessel ischemic disease of periventricular and subcortical white matter. No acute intracranial abnormality. Electronically Signed   By: Ashley Royalty M.D.   On: 02/24/2016 17:48   Ct Chest W Contrast  Result Date: 02/24/2016 CLINICAL DATA:  Pt [facility] from [facility] (Assisted Living[facility] Staff sts pt was very lethargic today, pt dry, very thirsty per EMS. Chronic back pain per EMS. Pt had fall two weeks ago hit left  side EXAM: CT CHEST, ABDOMEN, AND PELVIS WITH CONTRAST TECHNIQUE: Multidetector CT imaging of the chest, abdomen and pelvis was performed following the standard protocol during bolus administration of intravenous contrast. CONTRAST:  153mL ISOVUE-300 IOPAMIDOL (ISOVUE-300) INJECTION 61% COMPARISON:  None. FINDINGS: CT CHEST FINDINGS Cardiovascular: No significant vascular findings. Normal heart size. No pericardial effusion. Thoracic aorta is normal in caliber. No thoracic aortic dissection. Thoracic aortic atherosclerosis. Prior CABG. Mediastinum/Nodes: No enlarged mediastinal, hilar, or axillary lymph nodes. Thyroid gland, trachea, and esophagus demonstrate no significant findings. Lungs/Pleura: Small left pleural effusion. No focal consolidation or pneumothorax. Musculoskeletal: No acute osseous abnormality. Large heterogeneous mass measuring 9.5 x 7.6 x 13.4 cm deep to the left pectoralis major muscle and may be centered within the left pectoralis minor muscle. There is expansion of the intercostal muscle between the left second and third rib adjacent to the mass resulting in a convex margin along the pleural surface and a similar of fact on the adjacent third and fourth intercostal space. There is expansion of the left serratus anterior muscle with adjacent inflammatory changes. There is expansion of the left latissimus muscle with surrounding inflammatory changes. Small amount hemorrhagic fluid tracks along the left lateral chest wall and into the left lateral abdominal wall. CT ABDOMEN PELVIS FINDINGS Hepatobiliary: No focal liver abnormality is seen. No gallstones, gallbladder wall thickening, or biliary dilatation. Pancreas: Unremarkable. No pancreatic ductal dilatation or surrounding inflammatory changes. Spleen: Normal in size without focal abnormality. Adrenals/Urinary Tract: Adrenal glands are unremarkable. Kidneys are normal,  without renal calculi, focal lesion, or hydronephrosis. Bladder is  unremarkable. Stomach/Bowel: Stomach is within normal limits. Extensive sigmoid diverticulosis without evidence of diverticulitis. No evidence of bowel wall thickening, distention, or inflammatory changes. Vascular/Lymphatic: Normal caliber abdominal aorta with atherosclerosis. Reproductive: Prostate enlargement impressing on the bladder base. Other: No abdominal wall hernia or abnormality. No abdominopelvic ascites. Musculoskeletal: Severe degenerative disc disease with disc height loss at L4-5 and L5-S1 with bilateral facet arthropathy. Generalized osteopenia. No acute osseous abnormality. IMPRESSION: 1. Large heterogeneous mass measuring 9.5 x 7.6 x 13.4 cm deep to the left pectoralis major muscle and may be centered within the left pectoralis minor muscle likely reflecting a large intramuscular hematoma. Expansion of the intercostal muscle between the left second and third rib adjacent to the mass resulting in a convex margin along the pleural surface and a similar affect on the adjacent third and fourth intercostal muscle likely also reflecting an intramuscular hematoma. 2. Expansion of the left serratus anterior and latissimus muscles with adjacent inflammatory changes consistent with intramuscular hematomas. 3. Close clinical follow-up is recommended to document complete resolution. In the absence of resolution, follow-up imaging is recommended to evaluate for in the underlying pathology. Electronically Signed   By: Kathreen Devoid   On: 02/24/2016 17:54   Ct Abdomen Pelvis W Contrast  Result Date: 02/24/2016 CLINICAL DATA:  Pt comes from [facility] ([place] Staff sts pt was very lethargic today, pt dry, very thirsty per EMS. Chronic back pain per EMS. Pt had fall two weeks ago hit left side EXAM: CT CHEST, ABDOMEN, AND PELVIS WITH CONTRAST TECHNIQUE: Multidetector CT imaging of the chest, abdomen and pelvis was performed following the standard protocol during bolus administration of  intravenous contrast. CONTRAST:  138mL ISOVUE-300 IOPAMIDOL (ISOVUE-300) INJECTION 61% COMPARISON:  None. FINDINGS: CT CHEST FINDINGS Cardiovascular: No significant vascular findings. Normal heart size. No pericardial effusion. Thoracic aorta is normal in caliber. No thoracic aortic dissection. Thoracic aortic atherosclerosis. Prior CABG. Mediastinum/Nodes: No enlarged mediastinal, hilar, or axillary lymph nodes. Thyroid gland, trachea, and esophagus demonstrate no significant findings. Lungs/Pleura: Small left pleural effusion. No focal consolidation or pneumothorax. Musculoskeletal: No acute osseous abnormality. Large heterogeneous mass measuring 9.5 x 7.6 x 13.4 cm deep to the left pectoralis major muscle and may be centered within the left pectoralis minor muscle. There is expansion of the intercostal muscle between the left second and third rib adjacent to the mass resulting in a convex margin along the pleural surface and a similar of fact on the adjacent third and fourth intercostal space. There is expansion of the left serratus anterior muscle with adjacent inflammatory changes. There is expansion of the left latissimus muscle with surrounding inflammatory changes. Small amount hemorrhagic fluid tracks along the left lateral chest wall and into the left lateral abdominal wall. CT ABDOMEN PELVIS FINDINGS Hepatobiliary: No focal liver abnormality is seen. No gallstones, gallbladder wall thickening, or biliary dilatation. Pancreas: Unremarkable. No pancreatic ductal dilatation or surrounding inflammatory changes. Spleen: Normal in size without focal abnormality. Adrenals/Urinary Tract: Adrenal glands are unremarkable. Kidneys are normal, without renal calculi, focal lesion, or hydronephrosis. Bladder is unremarkable. Stomach/Bowel: Stomach is within normal limits. Extensive sigmoid diverticulosis without evidence of diverticulitis. No evidence of bowel wall thickening, distention, or inflammatory changes.  Vascular/Lymphatic: Normal caliber abdominal aorta with atherosclerosis. Reproductive: Prostate enlargement impressing on the bladder base. Other: No abdominal wall hernia or abnormality. No abdominopelvic ascites. Musculoskeletal: Severe degenerative disc disease with disc height loss at L4-5 and L5-S1 with bilateral facet  arthropathy. Generalized osteopenia. No acute osseous abnormality. IMPRESSION: 1. Large heterogeneous mass measuring 9.5 x 7.6 x 13.4 cm deep to the left pectoralis major muscle and may be centered within the left pectoralis minor muscle likely reflecting a large intramuscular hematoma. Expansion of the intercostal muscle between the left second and third rib adjacent to the mass resulting in a convex margin along the pleural surface and a similar affect on the adjacent third and fourth intercostal muscle likely also reflecting an intramuscular hematoma. 2. Expansion of the left serratus anterior and latissimus muscles with adjacent inflammatory changes consistent with intramuscular hematomas. 3. Close clinical follow-up is recommended to document complete resolution. In the absence of resolution, follow-up imaging is recommended to evaluate for in the underlying pathology. Electronically Signed   By: Kathreen Devoid   On: 02/24/2016 17:54      Scheduled Meds: . sodium chloride  10 mL/hr Intravenous Once  . sodium chloride   Intravenous Once  . ceFEPime (MAXIPIME) IV  1 g Intravenous Q12H  . donepezil  10 mg Oral QHS  . feeding supplement (ENSURE ENLIVE)  237 mL Oral BID BM  . memantine  10 mg Oral BID  . metoprolol tartrate  12.5 mg Oral Daily  . sodium chloride flush  3 mL Intravenous Q12H  . vancomycin  500 mg Intravenous Q12H   Continuous Infusions: . sodium chloride 75 mL/hr at 02/26/16 0255     LOS: 2 days    Time spent in minutes: 67    [place], MD [facility] Pager: [email or web address] Password [facility] 02/26/2016, 12:39 PM

## 2016-02-26 NOTE — Clinical Social Work Placement (Signed)
CSW received consult that patient was admitted from [facility] (Independent Living[facility]), reviewed PT evaluation recommending SNF. CSW sent information out to [facility] and left bed offers at bedside, awaiting call back from patient's daughter Sharyn Lull [phone].    Raynaldo Opitz, [facility] Clinical Social Worker cell #: [phone]    CLINICAL SOCIAL WORK PLACEMENT  NOTE  Date:  02/26/2016  Patient Details  Name: Marvin Wagner MRN: [id number] Date of Birth: 14-Aug-1930  Clinical Social Work is seeking post-discharge placement for this patient at the [place] level of care (*CSW will initial, date and re-position this form in  chart as items are completed):  Yes   Patient/family provided with [facility]'s list of facilities offering this level of care within the geographic area requested by the patient (or if unable, by the patient's family).  Yes   Patient/family informed of their freedom to choose among providers that offer the needed level of care, that participate in Medicare, Medicaid or managed care program needed by the patient, have an available bed and are willing to accept the patient.  Yes   Patient/family informed of [facility]'s ownership interest in [facility] and [facility], as well as of the fact that they are under no obligation to receive care at these facilities.  PASRR submitted to EDS on 02/26/16     PASRR number received on 02/26/16     Existing PASRR number confirmed on       FL2 transmitted to all facilities in geographic area requested by pt/family on 02/26/16     FL2 transmitted to all facilities within larger geographic area on       Patient informed that his/her managed care company has contracts with or will negotiate with certain facilities, including the following:        Yes   Patient/family informed of bed offers  received.  Patient chooses bed at       Physician recommends and patient chooses bed at      Patient to be transferred to   on  .  Patient to be transferred to facility by       Patient family notified on   of transfer.  Name of family member notified:        PHYSICIAN       Additional Comment:    _______________________________________________ Standley Brooking, LCSW 02/26/2016, 3:20 PM

## 2016-02-27 DIAGNOSIS — M6281 Muscle weakness (generalized): Secondary | ICD-10-CM | POA: Diagnosis not present

## 2016-02-27 DIAGNOSIS — S20212A Contusion of left front wall of thorax, initial encounter: Secondary | ICD-10-CM | POA: Diagnosis not present

## 2016-02-27 DIAGNOSIS — I4892 Unspecified atrial flutter: Secondary | ICD-10-CM | POA: Diagnosis not present

## 2016-02-27 DIAGNOSIS — Z951 Presence of aortocoronary bypass graft: Secondary | ICD-10-CM

## 2016-02-27 DIAGNOSIS — S20302D Unspecified superficial injuries of left front wall of thorax, subsequent encounter: Secondary | ICD-10-CM | POA: Diagnosis not present

## 2016-02-27 DIAGNOSIS — N390 Urinary tract infection, site not specified: Secondary | ICD-10-CM

## 2016-02-27 DIAGNOSIS — R0789 Other chest pain: Secondary | ICD-10-CM | POA: Diagnosis not present

## 2016-02-27 DIAGNOSIS — R278 Other lack of coordination: Secondary | ICD-10-CM | POA: Diagnosis not present

## 2016-02-27 DIAGNOSIS — D62 Acute posthemorrhagic anemia: Secondary | ICD-10-CM | POA: Diagnosis not present

## 2016-02-27 DIAGNOSIS — R531 Weakness: Secondary | ICD-10-CM | POA: Diagnosis not present

## 2016-02-27 DIAGNOSIS — F0280 Dementia in other diseases classified elsewhere without behavioral disturbance: Secondary | ICD-10-CM | POA: Diagnosis not present

## 2016-02-27 DIAGNOSIS — R319 Hematuria, unspecified: Secondary | ICD-10-CM | POA: Diagnosis not present

## 2016-02-27 DIAGNOSIS — R259 Unspecified abnormal involuntary movements: Secondary | ICD-10-CM | POA: Diagnosis not present

## 2016-02-27 DIAGNOSIS — E876 Hypokalemia: Secondary | ICD-10-CM | POA: Diagnosis not present

## 2016-02-27 DIAGNOSIS — G309 Alzheimer's disease, unspecified: Secondary | ICD-10-CM | POA: Diagnosis not present

## 2016-02-27 DIAGNOSIS — M79622 Pain in left upper arm: Secondary | ICD-10-CM | POA: Diagnosis not present

## 2016-02-27 DIAGNOSIS — R509 Fever, unspecified: Secondary | ICD-10-CM

## 2016-02-27 DIAGNOSIS — I2510 Atherosclerotic heart disease of native coronary artery without angina pectoris: Secondary | ICD-10-CM | POA: Diagnosis not present

## 2016-02-27 DIAGNOSIS — S20212D Contusion of left front wall of thorax, subsequent encounter: Secondary | ICD-10-CM | POA: Diagnosis not present

## 2016-02-27 DIAGNOSIS — R2681 Unsteadiness on feet: Secondary | ICD-10-CM | POA: Diagnosis not present

## 2016-02-27 DIAGNOSIS — N3000 Acute cystitis without hematuria: Secondary | ICD-10-CM | POA: Diagnosis not present

## 2016-02-27 DIAGNOSIS — R296 Repeated falls: Secondary | ICD-10-CM | POA: Diagnosis not present

## 2016-02-27 LAB — CBC WITH DIFFERENTIAL/PLATELET
Basophils Absolute: 0 10*3/uL (ref 0.0–0.1)
Basophils Relative: 0 %
EOS PCT: 1 %
Eosinophils Absolute: 0.1 10*3/uL (ref 0.0–0.7)
HCT: 30.1 % — ABNORMAL LOW (ref 39.0–52.0)
HEMOGLOBIN: 10.3 g/dL — AB (ref 13.0–17.0)
LYMPHS ABS: 1.2 10*3/uL (ref 0.7–4.0)
LYMPHS PCT: 14 %
MCH: 29.9 pg (ref 26.0–34.0)
MCHC: 34.2 g/dL (ref 30.0–36.0)
MCV: 87.2 fL (ref 78.0–100.0)
MONOS PCT: 13 %
Monocytes Absolute: 1.1 10*3/uL — ABNORMAL HIGH (ref 0.1–1.0)
Neutro Abs: 6.1 10*3/uL (ref 1.7–7.7)
Neutrophils Relative %: 71 %
PLATELETS: 177 10*3/uL (ref 150–400)
RBC: 3.45 MIL/uL — AB (ref 4.22–5.81)
RDW: 16.6 % — ABNORMAL HIGH (ref 11.5–15.5)
WBC: 8.5 10*3/uL (ref 4.0–10.5)

## 2016-02-27 LAB — CULTURE, BLOOD (ROUTINE X 2)

## 2016-02-27 LAB — COMPREHENSIVE METABOLIC PANEL
ALK PHOS: 59 U/L (ref 38–126)
ALT: 75 U/L — ABNORMAL HIGH (ref 17–63)
ANION GAP: 5 (ref 5–15)
AST: 86 U/L — ABNORMAL HIGH (ref 15–41)
Albumin: 2.5 g/dL — ABNORMAL LOW (ref 3.5–5.0)
BILIRUBIN TOTAL: 1.4 mg/dL — AB (ref 0.3–1.2)
BUN: 19 mg/dL (ref 6–20)
CALCIUM: 7.8 mg/dL — AB (ref 8.9–10.3)
CO2: 29 mmol/L (ref 22–32)
Chloride: 105 mmol/L (ref 101–111)
Creatinine, Ser: 0.47 mg/dL — ABNORMAL LOW (ref 0.61–1.24)
GFR calc Af Amer: >60 mL/min (ref >60)
GLUCOSE: 96 mg/dL (ref 65–99)
POTASSIUM: 3.3 mmol/L — AB (ref 3.5–5.1)
Sodium: 139 mmol/L (ref 135–145)
TOTAL PROTEIN: 5.4 g/dL — AB (ref 6.5–8.1)

## 2016-02-27 LAB — PROTIME-INR
INR: 2.04
Prothrombin Time: 23.4 seconds — ABNORMAL HIGH (ref 11.4–15.2)

## 2016-02-27 MED ORDER — FERROUS SULFATE 325 (65 FE) MG PO TABS: 325.0000 mg | ORAL_TABLET | Freq: Three times a day (TID) | ORAL | 0 refills | Status: DC

## 2016-02-27 MED ORDER — CEFPODOXIME PROXETIL 200 MG PO TABS: 200.0000 mg | ORAL_TABLET | Freq: Two times a day (BID) | ORAL | 0 refills | Status: DC

## 2016-02-27 MED ORDER — POTASSIUM CHLORIDE CRYS ER 20 MEQ PO TBCR
40.0000 meq | EXTENDED_RELEASE_TABLET | Freq: Once | ORAL | Status: AC
  Administered 2016-02-27: 40 meq via ORAL
  Filled 2016-02-27: qty 2

## 2016-02-27 NOTE — Care Management Important Message (Signed)
Important Message  Patient Details  Name: Marvin Wagner MRN: [id number] Date of Birth: March 17, 1930   Medicare Important Message Given:  Yes    Erenest Rasher, RN 02/27/2016, 3:48 PM

## 2016-02-27 NOTE — Progress Notes (Signed)
Leeroy Bock to be D/C'd Skilled nursing facility per MD order.  Discussed with the patient and all questions fully answered.  VSS, Skin clean, dry and intact without evidence of skin break down, no evidence of skin tears noted. IV catheter discontinued intact. Site without signs and symptoms of complications. Dressing and pressure applied.  An After Visit Summary was printed and given transport for facility.  D/c education completed with patient/family including follow up instructions, medication list, d/c activities limitations if indicated, with other d/c instructions as indicated by MD - patient able to verbalize understanding, all questions fully answered.   Patient instructed to return to ED, call 911, or call MD for any changes in condition.   Patient escorted via ptar transport with daughter.  Marcy Salvo 02/27/2016 5:51 PM

## 2016-02-27 NOTE — Progress Notes (Signed)
Pharmacy Antibiotic Note  Marvin Wagner is a 83 y.o. male admitted on 02/24/2016 with sepsis, UTI.  He was diagnosed with UTI on as OP 02/21/16 and started on Keflex.  Pharmacy was consulted for cefepime and vancomycin dosing.  Noted PCN allergy (unspecified reaction), but patient has tolerated ceftriaxone on 02/21/16 and outpatient cephalexin.  Blood cultures grew coag-negative Staph in 1 of 2 sets - suggestive of possible contaminant  Now on D#4 empiric cefepime and vancomycin.  SCr WNL on admission and has further improved with hydration.  Afebrile.  Leukocytosis has resolved.  Plan:  No change to current cefepime dosage (1g IV q12h)  Recommend de-escalation by stopping vancomycin   Await word on planned duration of antimicrobial therapy. Marland Kitchen  Height: 5\' 10"  (177.8 cm) Weight: 155 lb (70.3 kg) IBW/kg (Calculated) : 73  Temp (24hrs), Avg:98.2 F (36.8 C), Min:97.7 F (36.5 C), Max:99.1 F (37.3 C)   Recent Labs Lab 02/21/16 1145 02/24/16 1436 02/24/16 1448 02/24/16 2020 02/25/16 0818 02/26/16 0547 02/27/16 0531  WBC 15.9* 17.9*  --   --  12.3* 10.0 8.5  CREATININE 0.94 0.98  --   --  0.73 0.64 0.47*  LATICACIDVEN  --   --  1.24 1.00  --   --   --     Estimated Creatinine Clearance: 69.6 mL/min (by C-G formula based on SCr of 0.47 mg/dL (L)).    Allergies  Allergen Reactions  . Penicillins Other (See Comments)    Has patient had a PCN reaction causing immediate rash, facial/tongue/throat swelling, SOB or lightheadedness with hypotension: Unknown Has patient had a PCN reaction causing severe rash involving mucus membranes or skin necrosis: Unknown Has patient had a PCN reaction that required hospitalization: Unknown Has patient had a PCN reaction occurring within the last 10 years: Unknown If all of the above answers are "NO", then may proceed with Cephalosporin use.     Antimicrobials this admission: 1/24 Vancomycin >>  1/24 Cefepime >>    Dose adjustments  this admission:  Microbiology results: 1/24 BCx: CoNS in 1 of 2 sets 1/24 BCID: 1 of 2 Staph species, mech A detected 1/24 UCx: NGF 1/24 MRSA PCR Screen: negative   Thank you for allowing pharmacy to be a part of this patient's care.  Clayburn Pert, PharmD, BCPS Pager: [phone] 02/27/2016  12:51 PM

## 2016-02-27 NOTE — Progress Notes (Signed)
Physical Therapy Treatment Patient Details Name: Marvin Wagner MRN: [id number] DOB: 1930-06-09 Today's Date: 02/27/2016    History of Present Illness 83 yo male from [place] with history of Aortic stenosis, HTN, CAD, memory disturbance. The patient was admitted 02/24/16 with lethargy, decreased intake, INR 7.51. Patient has history of fall 2 weeks prior. CT chest shows large hematoma of left chest wall.    PT Comments    Pt reports L ankle pain with mobility and only tolerated very short ambulation distance (due to reported dizziness, vitals WNL - see mobility section below).  Continue to recommend SNF upon d/c.  Pt requires at least min assist at this time and also presents with cognitive deficits (unknown baseline).   Follow Up Recommendations  SNF;Supervision/Assistance - 24 hour     Equipment Recommendations  None recommended by PT    Recommendations for Other Services       Precautions / Restrictions Precautions Precautions: Fall Precaution Comments: left chest wall and left shoulder with noted edema and discomfort to move the arm    Mobility  Bed Mobility Overal bed mobility: Needs Assistance Bed Mobility: Supine to Sit     Supine to sit: Min guard     General bed mobility comments: increased time to perform  Transfers Overall transfer level: Needs assistance Equipment used: Rolling walker (2 wheeled) Transfers: Sit to/from Stand Sit to Stand: Min assist         General transfer comment: assist to rise and steady, required a couple attempts to rise, reported L ankle pain? - notified RN  Ambulation/Gait Ambulation/Gait assistance: Min assist Ambulation Distance (Feet): 18 Feet Assistive device: Rolling walker (2 wheeled) Gait Pattern/deviations: Step-to pattern;Decreased step length - right;Antalgic;Decreased stance time - left Gait velocity: decreased   General Gait Details: pt with antalgic gait due to reported L ankle pain, distance limited by  dizziness, assist to steady and due to weakness.  vitals upon return to room: 121/69 mmHg, 97 bpm, 99% room air (RN present and aware)   Stairs            Wheelchair Mobility    Modified Rankin (Stroke Patients Only)       Balance                                    Cognition Arousal/Alertness: Awake/alert Behavior During Therapy: WFL for tasks assessed/performed Overall Cognitive Status: No family/caregiver present to determine baseline cognitive functioning Area of Impairment: Orientation;Following commands Orientation Level: Place;Time;Situation;Disoriented to     Following Commands: Follows one step commands inconsistently;Follows one step commands with increased time       General Comments: pt states he ambulated yesterday once he got home, appears very confused, hx of memory disturbance (uncertain of cognitive baseline)    Exercises      General Comments        Pertinent Vitals/Pain Pain Assessment: Faces Faces Pain Scale: Hurts even more Pain Location: L ankle Pain Descriptors / Indicators: Grimacing;Guarding Pain Intervention(s): Repositioned;Monitored during session (RN notified)    Home Living                      Prior Function            PT Goals (current goals can now be found in the care plan section) Progress towards PT goals: Progressing toward goals    Frequency    Min 3X/week  PT Plan Current plan remains appropriate    Co-evaluation             End of Session Equipment Utilized During Treatment: Gait belt Activity Tolerance: Patient limited by fatigue (limited by dizziness) Patient left: in chair;with call bell/phone within reach;with chair alarm set     Time: 1120-1137 PT Time Calculation (min) (ACUTE ONLY): 17 min  Charges:  $Gait Training: 8-22 mins                    G Codes:      Dua Mehler,KATHrine E March 08, 2016, 1:14 PM Carmelia Bake, PT, DPT 03-08-2016 Pager: [phone]

## 2016-02-27 NOTE — Clinical Social Work Placement (Signed)
   CLINICAL SOCIAL WORK PLACEMENT  NOTE 02/27/16 - DISCHARGED TO [facility]IA AMBULANCE  Date:  02/27/2016  Patient Details  Name: Marvin Wagner MRN: [id number] Date of Birth: Jun 20, 1930  Clinical Social Work is seeking post-discharge placement for this patient at the [place] level of care (*CSW will initial, date and re-position this form in  chart as items are completed):  Yes   Patient/family provided with [place] Work Department's list of facilities offering this level of care within the geographic area requested by the patient (or if unable, by the patient's family).  Yes   Patient/family informed of their freedom to choose among providers that offer the needed level of care, that participate in Medicare, Medicaid or managed care program needed by the patient, have an available bed and are willing to accept the patient.  Yes   Patient/family informed of [facility]'s ownership interest in [facility] and [facility], as well as of the fact that they are under no obligation to receive care at these facilities.  PASRR submitted to EDS on 02/26/16     PASRR number received on 02/26/16     Existing PASRR number confirmed on       FL2 transmitted to all facilities in geographic area requested by pt/family on 02/26/16     FL2 transmitted to all facilities within larger geographic area on       Patient informed that his/her managed care company has contracts with or will negotiate with certain facilities, including the following:        Yes   Patient/family informed of bed offers received.  Patient chooses bed at   [facility]  Physician recommends and patient chooses bed at      Patient to be transferred to  [facility] on  02/27/16.  Patient to be transferred to facility by  ambulance     Patient family notified on  02/27/16 of transfer.  Name of family member notified:   Daughter Fabian Ros [phone])   PHYSICIAN      Additional Comment:    _______________________________________________ Sable Feil, LCSW 02/27/2016, 5:10 PM

## 2016-02-27 NOTE — Care Management Note (Signed)
Case Management Note  Patient Details  Name: Marvin Wagner MRN: [id number] Date of Birth: 07/26/1930  Subjective/Objective:    Falls, hematoma of chest wall, UTI                Action/Plan: Discharge Planning: AVS reviewed: NCM spoke to pt and gave permission to speak to dtr, Sharyn Lull. Attempted call, no answer. Plan is dc to SNF. CSW following for SNF placement.   PCP  Eulas Post MD   Expected Discharge Date:  02/27/16               Expected Discharge Plan:  [place]  In-House Referral:  Clinical Social Work  Discharge planning Services  CM Consult  Post Acute Care Choice:  NA Choice offered to:  NA  DME Arranged:  N/A DME Agency:  NA  HH Arranged:  NA HH Agency:  NA  Status of Service:  Completed, signed off  If discussed at [place] of Stay Meetings, dates discussed:    Additional Comments:  Erenest Rasher, RN 02/27/2016, 3:46 PM

## 2016-02-27 NOTE — Discharge Summary (Addendum)
Physician Discharge Summary  Marvin Wagner [id number] DOB: 13-Nov-1930 DOA: 02/24/2016  PCP: Marvin Post, MD  Admit date: 02/24/2016 Discharge date: 02/27/2016  Admitted From: ALF  Disposition:  SNF- [facility]  Recommendations for Outpatient Follow-up:  1. Resume Coumadin or start NOAC in 1 wk as long as there is no further bleeding 2. Bmet, CBC and INR in 4 days  Home Health:  ordered  Equipment/Devices:  walker    Discharge Condition:  stable   CODE STATUS:  Full code   Diet recommendation:  Heart healthy Consultations:  none    Discharge Diagnoses:  Principal Problem:   Hematoma of chest wall, left, initial encounter Active Problems:   Acute blood loss anemia   UTI (urinary tract infection)   Hx of CABG   Hyperlipidemia   Atrial flutter (HCC)   Dementia   Fever   Supratherapeutic INR   Pressure injury of skin    Subjective: No complaints today.   Brief Summary: Marvin Iffland Snowis a 83 y.o.malewith medical history significant of aortic insufficiency, aortic stenosis, CAD, hyperlipidemia, hypertension, memory disturbance who is being brought to the emergency department from [facility] assisted living for evaluation of lethargy, decreased appetite and fluid intake. The patient had a fall about two weeks ago and has an extensive area of ecchymosis on his left lower chest wall and right upper flank areas. INR noted to be 7.51. He has chest wall hematomas noted on CT of the chest. Vit K and FFP given in ER.   Hospital Course:  Principal Problem:   Hematoma of chest wall, left with Acute blood loss anemia - traumatic due to fall in setting of elevated INR -Hb drop 13.2 >> 8.3>> 7.4- will transfused 2 u PRBC -- Hb 10.3 - FFP and Vit K given and INR now down < 2.5  Active Problems:   Fever/ UTI with hematuria - temp 101 on admission- he was on Keflex as outpt for a UTI - in hospital has been on Vanc and Cefepime- blood cultures 1 / 2 sets coag neg  staph - U culture- no growth - will switch to Vantin on d/c for a total of 1 wk  Hypokalemia - replacing   A-fib - hold Coumadin due to chest wall hematomas - cont Lopressor    Hx of CABG    Hyperlipidemia    Dementia - very poor short term memory- cont Namenda and Aricept    Discharge Instructions  Discharge Instructions    Diet - low sodium heart healthy    Complete by:  As directed    Increase activity slowly    Complete by:  As directed      Allergies as of 02/27/2016      Reactions   Penicillins Other (See Comments)   Has patient had a PCN reaction causing immediate rash, facial/tongue/throat swelling, SOB or lightheadedness with hypotension: Unknown Has patient had a PCN reaction causing severe rash involving mucus membranes or skin necrosis: Unknown Has patient had a PCN reaction that required hospitalization: Unknown Has patient had a PCN reaction occurring within the last 10 years: Unknown If all of the above answers are "NO", then may proceed with Cephalosporin use.      Medication List    STOP taking these medications   cephALEXin 500 MG capsule Commonly known as:  KEFLEX   warfarin 5 MG tablet Commonly known as:  COUMADIN     TAKE these medications   acetaminophen 500 MG tablet Commonly known  as:  TYLENOL Take 500 mg by mouth every 6 (six) hours as needed (for pain).   atorvastatin 40 MG tablet Commonly known as:  LIPITOR Take 40 mg by mouth daily.   calcium carbonate 600 MG Tabs tablet Commonly known as:  OS-CAL Take 600 mg by mouth 2 (two) times daily with a meal.   cefpodoxime 200 MG tablet Commonly known as:  VANTIN Take 1 tablet (200 mg total) by mouth 2 (two) times daily.   donepezil 10 MG tablet Commonly known as:  ARICEPT TAKE 1 TABLET (10 MG TOTAL) BY MOUTH AT BEDTIME.   ferrous sulfate 325 (65 FE) MG tablet Take 1 tablet (325 mg total) by mouth 3 (three) times daily with meals.   memantine 10 MG tablet Commonly  known as:  NAMENDA TAKE   1 TABLET (10MG ) BY MOUTH TWICE DAILY   metoprolol tartrate 25 MG tablet Commonly known as:  LOPRESSOR Take 0.5 tablets (12.5 mg total) by mouth daily.   multivitamin tablet Take 1 tablet by mouth daily.   vitamin C 500 MG tablet Commonly known as:  ASCORBIC ACID Take 500 mg by mouth daily.   Vitamin D 2000 units tablet Take 2,000 Units by mouth daily.            Durable Medical Equipment        Start     Ordered   02/27/16 1332  For home use only DME Walker  Once    Question:  Patient needs a walker to treat with the following condition  Answer:  Physical deconditioning   02/27/16 1332      Allergies  Allergen Reactions  . Penicillins Other (See Comments)    Has patient had a PCN reaction causing immediate rash, facial/tongue/throat swelling, SOB or lightheadedness with hypotension: Unknown Has patient had a PCN reaction causing severe rash involving mucus membranes or skin necrosis: Unknown Has patient had a PCN reaction that required hospitalization: Unknown Has patient had a PCN reaction occurring within the last 10 years: Unknown If all of the above answers are "NO", then may proceed with Cephalosporin use.      Procedures/Studies:  Dg Chest 2 View  Result Date: 02/07/2016 CLINICAL DATA:  Fall with cough and congestion EXAM: CHEST  2 VIEW COMPARISON:  05/05/2009 FINDINGS: Wagner sternotomy changes and valvular replacement. The lungs are hyperinflated. No acute infiltrate or effusion. No pneumothorax. Cardiomediastinal silhouette stable with atherosclerosis. Possible right second rib deformity. IMPRESSION: 1. No acute infiltrate effusion or pneumothorax. 2. Questionable rib Electronically Signed   By: Donavan Foil M.D.   On: 02/07/2016 00:59   Ct Head Wo Contrast  Result Date: 02/24/2016 CLINICAL DATA:  Lethargy. Patient fell 2 weeks ago hitting left side. EXAM: CT HEAD WITHOUT CONTRAST TECHNIQUE: Contiguous axial images were obtained  from the base of the skull through the vertex without intravenous contrast. COMPARISON:  02/06/2016 CT FINDINGS: BRAIN: There is mild-to-moderate sulcal and ventricular prominence consistent with superficial and central atrophy. No intraparenchymal hemorrhage, mass effect nor midline shift. Periventricular and subcortical white matter hypodensities consistent with chronic small vessel ischemic disease are identified. No acute large vascular territory infarcts. No abnormal extra-axial fluid collections. Basal cisterns are not effaced and midline. VASCULAR: Moderate calcific atherosclerosis of the carotid siphons. SKULL: No skull fracture. No significant scalp soft tissue swelling. SINUSES/ORBITS: The mastoid air-cells are clear. The included paranasal sinuses are well-aerated.The included ocular globes and orbital contents are non-suspicious. OTHER: None. IMPRESSION: Mild to moderate superficial and central atrophy  with chronic small vessel ischemic disease of periventricular and subcortical white matter. No acute intracranial abnormality. Electronically Signed   By: Ashley Royalty M.D.   On: 02/24/2016 17:48   Ct Head Wo Contrast  Result Date: 02/07/2016 CLINICAL DATA:  Unwitnessed fall from bed today.  Anticoagulated. EXAM: CT HEAD WITHOUT CONTRAST CT CERVICAL SPINE WITHOUT CONTRAST TECHNIQUE: Multidetector CT imaging of the head and cervical spine was performed following the standard protocol without intravenous contrast. Multiplanar CT image reconstructions of the cervical spine were also generated. COMPARISON:  None. FINDINGS: CT HEAD FINDINGS Brain: There is no intracranial hemorrhage, mass or evidence of acute infarction. There is moderate generalized atrophy. There is moderate chronic microvascular ischemic change. There is no significant extra-axial fluid collection. No acute intracranial findings are evident. Vascular: No hyperdense vessel or unexpected calcification. Skull: Normal. Negative for fracture or  focal lesion. Sinuses/Orbits: No acute finding. Other: None. CT CERVICAL SPINE FINDINGS Alignment: Normal. Skull base and vertebrae: No acute fracture. No primary bone lesion or focal pathologic process. Soft tissues and spinal canal: No prevertebral fluid or swelling. No visible canal hematoma. Disc levels: Moderate degenerative cervical disc disease, greatest at C5-6 and C6-7. The facet articulations are intact and well preserved. Upper chest: Negative. Other: None IMPRESSION: 1. No acute intracranial findings. There is moderate generalized atrophy and chronic appearing white matter hypodensities which likely represent small vessel ischemic disease. 2. Negative for acute cervical spine fracture Electronically Signed   By: Andreas Newport M.D.   On: 02/07/2016 00:07   Ct Chest W Contrast  Result Date: 02/24/2016 CLINICAL DATA:  Pt comes from [facility] ([place]). Staff sts pt was very lethargic today, pt dry, very thirsty per EMS. Chronic back pain per EMS. Pt had fall two weeks ago hit left side EXAM: CT CHEST, ABDOMEN, AND PELVIS WITH CONTRAST TECHNIQUE: Multidetector CT imaging of the chest, abdomen and pelvis was performed following the standard protocol during bolus administration of intravenous contrast. CONTRAST:  137mL ISOVUE-300 IOPAMIDOL (ISOVUE-300) INJECTION 61% COMPARISON:  None. FINDINGS: CT CHEST FINDINGS Cardiovascular: No significant vascular findings. Normal heart size. No pericardial effusion. Thoracic aorta is normal in caliber. No thoracic aortic dissection. Thoracic aortic atherosclerosis. Prior CABG. Mediastinum/Nodes: No enlarged mediastinal, hilar, or axillary lymph nodes. Thyroid gland, trachea, and esophagus demonstrate no significant findings. Lungs/Pleura: Small left pleural effusion. No focal consolidation or pneumothorax. Musculoskeletal: No acute osseous abnormality. Large heterogeneous mass measuring 9.5 x 7.6 x 13.4 cm deep to the left pectoralis major muscle  and may be centered within the left pectoralis minor muscle. There is expansion of the intercostal muscle between the left second and third rib adjacent to the mass resulting in a convex margin along the pleural surface and a similar of fact on the adjacent third and fourth intercostal space. There is expansion of the left serratus anterior muscle with adjacent inflammatory changes. There is expansion of the left latissimus muscle with surrounding inflammatory changes. Small amount hemorrhagic fluid tracks along the left lateral chest wall and into the left lateral abdominal wall. CT ABDOMEN PELVIS FINDINGS Hepatobiliary: No focal liver abnormality is seen. No gallstones, gallbladder wall thickening, or biliary dilatation. Pancreas: Unremarkable. No pancreatic ductal dilatation or surrounding inflammatory changes. Spleen: Normal in size without focal abnormality. Adrenals/Urinary Tract: Adrenal glands are unremarkable. Kidneys are normal, without renal calculi, focal lesion, or hydronephrosis. Bladder is unremarkable. Stomach/Bowel: Stomach is within normal limits. Extensive sigmoid diverticulosis without evidence of diverticulitis. No evidence of bowel wall thickening, distention, or inflammatory  changes. Vascular/Lymphatic: Normal caliber abdominal aorta with atherosclerosis. Reproductive: Prostate enlargement impressing on the bladder base. Other: No abdominal wall hernia or abnormality. No abdominopelvic ascites. Musculoskeletal: Severe degenerative disc disease with disc height loss at L4-5 and L5-S1 with bilateral facet arthropathy. Generalized osteopenia. No acute osseous abnormality. IMPRESSION: 1. Large heterogeneous mass measuring 9.5 x 7.6 x 13.4 cm deep to the left pectoralis major muscle and may be centered within the left pectoralis minor muscle likely reflecting a large intramuscular hematoma. Expansion of the intercostal muscle between the left second and third rib adjacent to the mass resulting in a  convex margin along the pleural surface and a similar affect on the adjacent third and fourth intercostal muscle likely also reflecting an intramuscular hematoma. 2. Expansion of the left serratus anterior and latissimus muscles with adjacent inflammatory changes consistent with intramuscular hematomas. 3. Close clinical follow-up is recommended to document complete resolution. In the absence of resolution, follow-up imaging is recommended to evaluate for in the underlying pathology. Electronically Signed   By: Kathreen Devoid   On: 02/24/2016 17:54   Ct Cervical Spine Wo Contrast  Result Date: 02/07/2016 CLINICAL DATA:  Unwitnessed fall from bed today.  Anticoagulated. EXAM: CT HEAD WITHOUT CONTRAST CT CERVICAL SPINE WITHOUT CONTRAST TECHNIQUE: Multidetector CT imaging of the head and cervical spine was performed following the standard protocol without intravenous contrast. Multiplanar CT image reconstructions of the cervical spine were also generated. COMPARISON:  None. FINDINGS: CT HEAD FINDINGS Brain: There is no intracranial hemorrhage, mass or evidence of acute infarction. There is moderate generalized atrophy. There is moderate chronic microvascular ischemic change. There is no significant extra-axial fluid collection. No acute intracranial findings are evident. Vascular: No hyperdense vessel or unexpected calcification. Skull: Normal. Negative for fracture or focal lesion. Sinuses/Orbits: No acute finding. Other: None. CT CERVICAL SPINE FINDINGS Alignment: Normal. Skull base and vertebrae: No acute fracture. No primary bone lesion or focal pathologic process. Soft tissues and spinal canal: No prevertebral fluid or swelling. No visible canal hematoma. Disc levels: Moderate degenerative cervical disc disease, greatest at C5-6 and C6-7. The facet articulations are intact and well preserved. Upper chest: Negative. Other: None IMPRESSION: 1. No acute intracranial findings. There is moderate generalized atrophy  and chronic appearing white matter hypodensities which likely represent small vessel ischemic disease. 2. Negative for acute cervical spine fracture Electronically Signed   By: Andreas Newport M.D.   On: 02/07/2016 00:07   Ct Abdomen Pelvis W Contrast  Result Date: 02/24/2016 CLINICAL DATA:  Pt comes from [facility] ([place]). Staff sts pt was very lethargic today, pt dry, very thirsty per EMS. Chronic back pain per EMS. Pt had fall two weeks ago hit left side EXAM: CT CHEST, ABDOMEN, AND PELVIS WITH CONTRAST TECHNIQUE: Multidetector CT imaging of the chest, abdomen and pelvis was performed following the standard protocol during bolus administration of intravenous contrast. CONTRAST:  113mL ISOVUE-300 IOPAMIDOL (ISOVUE-300) INJECTION 61% COMPARISON:  None. FINDINGS: CT CHEST FINDINGS Cardiovascular: No significant vascular findings. Normal heart size. No pericardial effusion. Thoracic aorta is normal in caliber. No thoracic aortic dissection. Thoracic aortic atherosclerosis. Prior CABG. Mediastinum/Nodes: No enlarged mediastinal, hilar, or axillary lymph nodes. Thyroid gland, trachea, and esophagus demonstrate no significant findings. Lungs/Pleura: Small left pleural effusion. No focal consolidation or pneumothorax. Musculoskeletal: No acute osseous abnormality. Large heterogeneous mass measuring 9.5 x 7.6 x 13.4 cm deep to the left pectoralis major muscle and may be centered within the left pectoralis minor muscle. There is expansion of  the intercostal muscle between the left second and third rib adjacent to the mass resulting in a convex margin along the pleural surface and a similar of fact on the adjacent third and fourth intercostal space. There is expansion of the left serratus anterior muscle with adjacent inflammatory changes. There is expansion of the left latissimus muscle with surrounding inflammatory changes. Small amount hemorrhagic fluid tracks along the left lateral chest wall and  into the left lateral abdominal wall. CT ABDOMEN PELVIS FINDINGS Hepatobiliary: No focal liver abnormality is seen. No gallstones, gallbladder wall thickening, or biliary dilatation. Pancreas: Unremarkable. No pancreatic ductal dilatation or surrounding inflammatory changes. Spleen: Normal in size without focal abnormality. Adrenals/Urinary Tract: Adrenal glands are unremarkable. Kidneys are normal, without renal calculi, focal lesion, or hydronephrosis. Bladder is unremarkable. Stomach/Bowel: Stomach is within normal limits. Extensive sigmoid diverticulosis without evidence of diverticulitis. No evidence of bowel wall thickening, distention, or inflammatory changes. Vascular/Lymphatic: Normal caliber abdominal aorta with atherosclerosis. Reproductive: Prostate enlargement impressing on the bladder base. Other: No abdominal wall hernia or abnormality. No abdominopelvic ascites. Musculoskeletal: Severe degenerative disc disease with disc height loss at L4-5 and L5-S1 with bilateral facet arthropathy. Generalized osteopenia. No acute osseous abnormality. IMPRESSION: 1. Large heterogeneous mass measuring 9.5 x 7.6 x 13.4 cm deep to the left pectoralis major muscle and may be centered within the left pectoralis minor muscle likely reflecting a large intramuscular hematoma. Expansion of the intercostal muscle between the left second and third rib adjacent to the mass resulting in a convex margin along the pleural surface and a similar affect on the adjacent third and fourth intercostal muscle likely also reflecting an intramuscular hematoma. 2. Expansion of the left serratus anterior and latissimus muscles with adjacent inflammatory changes consistent with intramuscular hematomas. 3. Close clinical follow-up is recommended to document complete resolution. In the absence of resolution, follow-up imaging is recommended to evaluate for in the underlying pathology. Electronically Signed   By: Kathreen Devoid   On: 02/24/2016  17:54       Discharge Exam: Vitals:   02/27/16 0615 02/27/16 0839  BP: (!) 134/58 117/72  Pulse: 60 78  Resp: 16   Temp: 97.7 F (36.5 C)    Vitals:   02/26/16 1836 02/26/16 2101 02/27/16 0615 02/27/16 0839  BP: 130/78 138/83 (!) 134/58 117/72  Pulse: 91 82 60 78  Resp: 16 18 16    Temp: 98.3 F (36.8 C) 99.1 F (37.3 C) 97.7 F (36.5 C)   TempSrc: Oral Oral Oral   SpO2: 100% 98% 98%   Weight:      Height:        General: Pt is alert, awake, not in acute distress Cardiovascular: RRR, S1/S2 +, no rubs, no gallops Respiratory: CTA bilaterally, no wheezing, no rhonchi Abdominal: Soft, NT, ND, bowel sounds + Extremities: no edema, no cyanosis    The results of significant diagnostics from this hospitalization (including imaging, microbiology, ancillary and laboratory) are listed below for reference.     Microbiology: Recent Results (from the past 240 hour(s))  Urine culture     Status: None   Collection Time: 02/24/16  2:41 PM  Result Value Ref Range Status   Specimen Description URINE, RANDOM  Final   Special Requests NONE  Final   Culture   Final    NO GROWTH Performed at [facility], [address], [place], [place] [address]    Report Status 02/25/2016 FINAL  Final  Culture, blood (routine x 2)  Status: Abnormal   Collection Time: 02/24/16  3:09 PM  Result Value Ref Range Status   Specimen Description BLOOD RIGHT ANTECUBITAL  Final   Special Requests BOTTLES DRAWN AEROBIC AND ANAEROBIC 5CC  Final   Culture  Setup Time   Final    GRAM POSITIVE COCCI IN CLUSTERS IN BOTH AEROBIC AND ANAEROBIC BOTTLES CRITICAL RESULT CALLED TO, READ BACK BY AND VERIFIED WITH: PHARMD E WILLIAMSON [id number] MLM    Culture (A)  Final    STAPHYLOCOCCUS SPECIES (COAGULASE NEGATIVE) THE SIGNIFICANCE OF ISOLATING THIS ORGANISM FROM A SINGLE SET OF BLOOD CULTURES WHEN MULTIPLE SETS ARE DRAWN IS UNCERTAIN. PLEASE NOTIFY THE MICROBIOLOGY DEPARTMENT WITHIN ONE WEEK IF  SPECIATION AND SENSITIVITIES ARE REQUIRED. Performed at [facility], [place] [address] [place], [place] [address]    Report Status 02/27/2016 FINAL  Final  Blood Culture ID Panel (Reflexed)     Status: Abnormal   Collection Time: 02/24/16  3:09 PM  Result Value Ref Range Status   Enterococcus species NOT DETECTED NOT DETECTED Final   Listeria monocytogenes NOT DETECTED NOT DETECTED Final   Staphylococcus species DETECTED (A) NOT DETECTED Final    Comment: CRITICAL RESULT CALLED TO, READ BACK BY AND VERIFIED WITH: PHARMD E WILLIAMSON [id number] MLM    Staphylococcus aureus NOT DETECTED NOT DETECTED Final   Methicillin resistance DETECTED (A) NOT DETECTED Final    Comment: CRITICAL RESULT CALLED TO, READ BACK BY AND VERIFIED WITH: PHRAMD E WILLIAMSON [id number] MLM    Streptococcus species NOT DETECTED NOT DETECTED Final   Streptococcus agalactiae NOT DETECTED NOT DETECTED Final   Streptococcus pneumoniae NOT DETECTED NOT DETECTED Final   Streptococcus pyogenes NOT DETECTED NOT DETECTED Final   Acinetobacter baumannii NOT DETECTED NOT DETECTED Final   Enterobacteriaceae species NOT DETECTED NOT DETECTED Final   Enterobacter cloacae complex NOT DETECTED NOT DETECTED Final   Escherichia coli NOT DETECTED NOT DETECTED Final   Klebsiella oxytoca NOT DETECTED NOT DETECTED Final   Klebsiella pneumoniae NOT DETECTED NOT DETECTED Final   Proteus species NOT DETECTED NOT DETECTED Final   Serratia marcescens NOT DETECTED NOT DETECTED Final   Haemophilus influenzae NOT DETECTED NOT DETECTED Final   Neisseria meningitidis NOT DETECTED NOT DETECTED Final   Pseudomonas aeruginosa NOT DETECTED NOT DETECTED Final   Candida albicans NOT DETECTED NOT DETECTED Final   Candida glabrata NOT DETECTED NOT DETECTED Final   Candida krusei NOT DETECTED NOT DETECTED Final   Candida parapsilosis NOT DETECTED NOT DETECTED Final   Candida tropicalis NOT DETECTED NOT DETECTED Final    Comment: Performed  at [facility], [address], [place], [place] [address]  Culture, blood (routine x 2)     Status: None (Preliminary result)   Collection Time: 02/24/16  3:21 PM  Result Value Ref Range Status   Specimen Description BLOOD LEFT ANTECUBITAL  Final   Special Requests BOTTLES DRAWN AEROBIC AND ANAEROBIC 5CC  Final   Culture   Final    NO GROWTH 3 DAYS Performed at [facility], [place] [address], [place], [place] [address]    Report Status PENDING  Incomplete  MRSA PCR Screening     Status: None   Collection Time: 02/24/16  9:27 PM  Result Value Ref Range Status   MRSA by PCR NEGATIVE NEGATIVE Final    Comment:        The GeneXpert MRSA Assay (FDA approved for NASAL specimens only), is one component of a  comprehensive MRSA colonization surveillance program. It is not intended to diagnose MRSA infection nor to guide or monitor treatment for MRSA infections.      Labs: BNP (last 3 results) No results for input(s): BNP in the last 8760 hours. Basic Metabolic Panel:  Recent Labs Lab 02/21/16 1145 02/24/16 1436 02/25/16 0818 02/26/16 0547 02/27/16 0531  NA 140 138 140 142 139  K 4.2 4.1 3.4* 3.5 3.3*  CL 104 104 105 107 105  CO2 27 27 31 30 29   GLUCOSE 109* 129* 113* 101* 96  BUN 23* 41* 31* 27* 19  CREATININE 0.94 0.98 0.73 0.64 0.47*  CALCIUM 8.6* 7.8* 8.0* 7.7* 7.8*   Liver Function Tests:  Recent Labs Lab 02/24/16 1436 02/25/16 0818 02/26/16 0547 02/27/16 0531  AST 27 46* 103* 86*  ALT 18 29 73* 75*  ALKPHOS 58 57 55 59  BILITOT 1.0 1.4* 1.2 1.4*  PROT 5.9* 5.7* 5.3* 5.4*  ALBUMIN 3.1* 2.9* 2.5* 2.5*   No results for input(s): LIPASE, AMYLASE in the last 168 hours. No results for input(s): AMMONIA in the last 168 hours. CBC:  Recent Labs Lab 02/21/16 1145 02/24/16 1436 02/25/16 0818 02/26/16 0547 02/26/16 2046 02/27/16 0531  WBC 15.9* 17.9* 12.3* 10.0  --  8.5  NEUTROABS  --  14.8* 9.1* 7.3  --  6.1  HGB 13.4 10.5* 8.3* 7.4* 9.9*  10.3*  HCT 38.0* 30.3* 23.9* 21.1* 29.3* 30.1*  MCV 92.2 94.1 94.5 95.0  --  87.2  PLT 239 221 173 160  --  177   Cardiac Enzymes: No results for input(s): CKTOTAL, CKMB, CKMBINDEX, TROPONINI in the last 168 hours. BNP: Invalid input(s): POCBNP CBG: No results for input(s): GLUCAP in the last 168 hours. D-Dimer No results for input(s): DDIMER in the last 72 hours. Hgb A1c No results for input(s): HGBA1C in the last 72 hours. Lipid Profile No results for input(s): CHOL, HDL, LDLCALC, TRIG, CHOLHDL, LDLDIRECT in the last 72 hours. Thyroid function studies No results for input(s): TSH, T4TOTAL, T3FREE, THYROIDAB in the last 72 hours.  Invalid input(s): FREET3 Anemia work up No results for input(s): VITAMINB12, FOLATE, FERRITIN, TIBC, IRON, RETICCTPCT in the last 72 hours. Urinalysis    Component Value Date/Time   COLORURINE RED (A) 02/25/2016 0301   APPEARANCEUR TURBID (A) 02/25/2016 0301   LABSPEC  02/25/2016 0301    TEST NOT REPORTED DUE TO COLOR INTERFERENCE OF URINE PIGMENT   PHURINE  02/25/2016 0301    TEST NOT REPORTED DUE TO COLOR INTERFERENCE OF URINE PIGMENT   GLUCOSEU (A) 02/25/2016 0301    TEST NOT REPORTED DUE TO COLOR INTERFERENCE OF URINE PIGMENT   HGBUR (A) 02/25/2016 0301    TEST NOT REPORTED DUE TO COLOR INTERFERENCE OF URINE PIGMENT   BILIRUBINUR (A) 02/25/2016 0301    TEST NOT REPORTED DUE TO COLOR INTERFERENCE OF URINE PIGMENT   KETONESUR (A) 02/25/2016 0301    TEST NOT REPORTED DUE TO COLOR INTERFERENCE OF URINE PIGMENT   PROTEINUR (A) 02/25/2016 0301    TEST NOT REPORTED DUE TO COLOR INTERFERENCE OF URINE PIGMENT   UROBILINOGEN 0.2 12/15/2008 1405   NITRITE NEGATIVE 02/25/2016 0301   LEUKOCYTESUR NEGATIVE 02/25/2016 0301   Sepsis Labs Invalid input(s): PROCALCITONIN,  WBC,  LACTICIDVEN Microbiology Recent Results (from the past 240 hour(s))  Urine culture     Status: None   Collection Time: 02/24/16  2:41 PM  Result Value Ref Range Status    Specimen Description URINE, RANDOM  Final  Special Requests NONE  Final   Culture   Final    NO GROWTH Performed at [facility], [place] [address], [place], [place] [address]    Report Status 02/25/2016 FINAL  Final  Culture, blood (routine x 2)     Status: Abnormal   Collection Time: 02/24/16  3:09 PM  Result Value Ref Range Status   Specimen Description BLOOD RIGHT ANTECUBITAL  Final   Special Requests BOTTLES DRAWN AEROBIC AND ANAEROBIC 5CC  Final   Culture  Setup Time   Final    GRAM POSITIVE COCCI IN CLUSTERS IN BOTH AEROBIC AND ANAEROBIC BOTTLES CRITICAL RESULT CALLED TO, READ BACK BY AND VERIFIED WITH: PHARMD E WILLIAMSON [id number] MLM    Culture (A)  Final    STAPHYLOCOCCUS SPECIES (COAGULASE NEGATIVE) THE SIGNIFICANCE OF ISOLATING THIS ORGANISM FROM A SINGLE SET OF BLOOD CULTURES WHEN MULTIPLE SETS ARE DRAWN IS UNCERTAIN. PLEASE NOTIFY THE MICROBIOLOGY DEPARTMENT WITHIN ONE WEEK IF SPECIATION AND SENSITIVITIES ARE REQUIRED. Performed at [facility], [place] [address] [place], [place] [address]    Report Status 02/27/2016 FINAL  Final  Blood Culture ID Panel (Reflexed)     Status: Abnormal   Collection Time: 02/24/16  3:09 PM  Result Value Ref Range Status   Enterococcus species NOT DETECTED NOT DETECTED Final   Listeria monocytogenes NOT DETECTED NOT DETECTED Final   Staphylococcus species DETECTED (A) NOT DETECTED Final    Comment: CRITICAL RESULT CALLED TO, READ BACK BY AND VERIFIED WITH: PHARMD E WILLIAMSON CO:[id number] MLM    Staphylococcus aureus NOT DETECTED NOT DETECTED Final   Methicillin resistance DETECTED (A) NOT DETECTED Final    Comment: CRITICAL RESULT CALLED TO, READ BACK BY AND VERIFIED WITH: PHRAMD E WILLIAMSON CO:[id number] [id number] MLM    Streptococcus species NOT DETECTED NOT DETECTED Final   Streptococcus agalactiae NOT DETECTED NOT DETECTED Final   Streptococcus pneumoniae NOT DETECTED NOT DETECTED Final   Streptococcus pyogenes NOT DETECTED NOT  DETECTED Final   Acinetobacter baumannii NOT DETECTED NOT DETECTED Final   Enterobacteriaceae species NOT DETECTED NOT DETECTED Final   Enterobacter cloacae complex NOT DETECTED NOT DETECTED Final   Escherichia coli NOT DETECTED NOT DETECTED Final   Klebsiella oxytoca NOT DETECTED NOT DETECTED Final   Klebsiella pneumoniae NOT DETECTED NOT DETECTED Final   Proteus species NOT DETECTED NOT DETECTED Final   Serratia marcescens NOT DETECTED NOT DETECTED Final   Haemophilus influenzae NOT DETECTED NOT DETECTED Final   Neisseria meningitidis NOT DETECTED NOT DETECTED Final   Pseudomonas aeruginosa NOT DETECTED NOT DETECTED Final   Candida albicans NOT DETECTED NOT DETECTED Final   Candida glabrata NOT DETECTED NOT DETECTED Final   Candida krusei NOT DETECTED NOT DETECTED Final   Candida parapsilosis NOT DETECTED NOT DETECTED Final   Candida tropicalis NOT DETECTED NOT DETECTED Final    Comment: Performed at [facility], [address] [place], [place] [address]  Culture, blood (routine x 2)     Status: None (Preliminary result)   Collection Time: 02/24/16  3:21 PM  Result Value Ref Range Status   Specimen Description BLOOD LEFT ANTECUBITAL  Final   Special Requests BOTTLES DRAWN AEROBIC AND ANAEROBIC 5CC  Final   Culture   Final    NO GROWTH 3 DAYS Performed at [facility], [place] [address] [place],  [address]    Report Status PENDING  Incomplete  MRSA PCR Screening     Status: None  Collection Time: 02/24/16  9:27 PM  Result Value Ref Range Status   MRSA by PCR NEGATIVE NEGATIVE Final    Comment:        The GeneXpert MRSA Assay (FDA approved for NASAL specimens only), is one component of a comprehensive MRSA colonization surveillance program. It is not intended to diagnose MRSA infection nor to guide or monitor treatment for MRSA infections.      Time coordinating discharge: Over 30 minutes  SIGNED:   Debbe Odea, MD  [facility] 02/27/2016, 1:35 PM Pager   If 7PM-7AM, please contact night-coverage [email or web address] Password [email or web address]

## 2016-02-29 ENCOUNTER — Encounter: Payer: Self-pay | Admitting: Internal Medicine

## 2016-02-29 ENCOUNTER — Non-Acute Institutional Stay (SKILLED_NURSING_FACILITY): Payer: Medicare Other | Admitting: Internal Medicine

## 2016-02-29 DIAGNOSIS — D62 Acute posthemorrhagic anemia: Secondary | ICD-10-CM

## 2016-02-29 DIAGNOSIS — F0280 Dementia in other diseases classified elsewhere without behavioral disturbance: Secondary | ICD-10-CM | POA: Diagnosis not present

## 2016-02-29 DIAGNOSIS — G309 Alzheimer's disease, unspecified: Secondary | ICD-10-CM | POA: Diagnosis not present

## 2016-02-29 DIAGNOSIS — E876 Hypokalemia: Secondary | ICD-10-CM | POA: Diagnosis not present

## 2016-02-29 DIAGNOSIS — I2510 Atherosclerotic heart disease of native coronary artery without angina pectoris: Secondary | ICD-10-CM | POA: Diagnosis not present

## 2016-02-29 DIAGNOSIS — S20212D Contusion of left front wall of thorax, subsequent encounter: Secondary | ICD-10-CM | POA: Diagnosis not present

## 2016-02-29 DIAGNOSIS — R531 Weakness: Secondary | ICD-10-CM

## 2016-02-29 DIAGNOSIS — I4892 Unspecified atrial flutter: Secondary | ICD-10-CM

## 2016-02-29 DIAGNOSIS — N3000 Acute cystitis without hematuria: Secondary | ICD-10-CM

## 2016-02-29 LAB — TYPE AND SCREEN
Blood Product Expiration Date: 201802142359
Blood Product Expiration Date: 201802142359
ISSUE DATE / TIME: 201801261241
ISSUE DATE / TIME: 201801261553
UNIT TYPE AND RH: 5100
Unit Type and Rh: 5100

## 2016-02-29 LAB — CULTURE, BLOOD (ROUTINE X 2): CULTURE: NO GROWTH

## 2016-02-29 NOTE — Progress Notes (Signed)
LOCATION: [facility]  PCP: Eulas Post, MD   Code Status: Full Code  Goals of care: Advanced Directive information Advanced Directives 02/24/2016  Does Patient Have a Medical Advance Directive? Yes  Type of Advance Directive [place]  Does patient want to make changes to medical advance directive? No - Patient declined  Copy of [place] in Chart? No - copy requested       Extended Emergency Contact Information Primary Emergency Contact: Drummond,Edna Address: [address]          [place], [place] [address] [place] Phone: [phone] Relation: Spouse Secondary Emergency Contact: Beckmann,Michelle  [place] Phone: [phone] Relation: Daughter   Allergies  Allergen Reactions  . Penicillins Other (See Comments)    Has patient had a PCN reaction causing immediate rash, facial/tongue/throat swelling, SOB or lightheadedness with hypotension: Unknown Has patient had a PCN reaction causing severe rash involving mucus membranes or skin necrosis: Unknown Has patient had a PCN reaction that required hospitalization: Unknown Has patient had a PCN reaction occurring within the last 10 years: Unknown If all of the above answers are "NO", then may proceed with Cephalosporin use.     Chief Complaint  Patient presents with  . New Admit To SNF    New Admission Visit      HPI:  Patient is a 83 y.o. male seen today for short term rehabilitation post hospital admission from 02/24/2016-02/27/2016 post fall with chest wall hematoma and supratherapeutic INR. He received vitamin K and fresh frozen plasma. He had a drop in his hemoglobin and required 2 unit packed red blood cell transfusion. He was also treated for UTI with antibiotics this admission. His Coumadin has been on hold. He has medical history of aortic insufficiency, coronary artery disease, hypertension, dementia,  hyperlipidemia among others. He was residing in an assisted living at [facility] prior to this. He is seen in his room today.  Review of Systems:  Constitutional: Negative for fever, chills, diaphoresis.  HENT: Negative for headache, congestion, nasal discharge, sore throat, difficulty swallowing.   Eyes: Negative for blurred vision, double vision and discharge.  Respiratory: Negative for cough, shortness of breath and wheezing.   Cardiovascular: Negative for chest pain, palpitations, leg swelling.  Gastrointestinal: Negative for heartburn, nausea, vomiting, abdominal pain, loss of appetite, melena, diarrhea and constipation. Last bowel movement was yesterday. Genitourinary: Negative for dysuria and flank pain.  Musculoskeletal: Negative for back pain, fall in the facility.  Skin: Negative for itching, rash.  Neurological: Negative for dizziness. Psychiatric/Behavioral: Negative for depression   Past Medical History:  Diagnosis Date  . Aortic insufficiency and aortic stenosis   . Aortic stenosis    SEVERE  . CAD (coronary artery disease)   . Heart murmur   . Hyperlipidemia   . Hypertension   . Mild memory disturbance    Past Surgical History:  Procedure Laterality Date  . AORTIC VALVE REPLACEMENT  12/17/08  . CARDIAC CATHETERIZATION    . CORONARY ANGIOPLASTY     NORMAL LEFT VENTRICULAR SIZEAND CONTRACTILITY WITH NORMAL SYSTOLIC FUNCTION. EF 65%  . TONSILLECTOMY     Social History:   reports that he quit smoking about 9 years ago. He has never used smokeless tobacco. He reports that he does not drink alcohol or use drugs.  Family History  Problem Relation Age of Onset  . Ankylosing spondylitis Brother     Medications: Allergies as  of 02/29/2016      Reactions   Penicillins Other (See Comments)   Has patient had a PCN reaction causing immediate rash, facial/tongue/throat swelling, SOB or lightheadedness with hypotension: Unknown Has patient had a PCN reaction  causing severe rash involving mucus membranes or skin necrosis: Unknown Has patient had a PCN reaction that required hospitalization: Unknown Has patient had a PCN reaction occurring within the last 10 years: Unknown If all of the above answers are "NO", then may proceed with Cephalosporin use.      Medication List       Accurate as of 02/29/16 12:29 PM. Always use your most recent med list.          acetaminophen 500 MG tablet Commonly known as:  TYLENOL Take 500 mg by mouth every 6 (six) hours as needed (for pain).   atorvastatin 40 MG tablet Commonly known as:  LIPITOR Take 40 mg by mouth daily.   calcium carbonate 600 MG Tabs tablet Commonly known as:  OS-CAL Take 600 mg by mouth 2 (two) times daily with a meal.   cefpodoxime 200 MG tablet Commonly known as:  VANTIN Take 1 tablet (200 mg total) by mouth 2 (two) times daily.   donepezil 10 MG tablet Commonly known as:  ARICEPT TAKE 1 TABLET (10 MG TOTAL) BY MOUTH AT BEDTIME.   ferrous sulfate 325 (65 FE) MG tablet Take 1 tablet (325 mg total) by mouth 3 (three) times daily with meals.   memantine 10 MG tablet Commonly known as:  NAMENDA TAKE   1 TABLET (10MG ) BY MOUTH TWICE DAILY   metoprolol tartrate 25 MG tablet Commonly known as:  LOPRESSOR Take 0.5 tablets (12.5 mg total) by mouth daily.   multivitamin tablet Take 1 tablet by mouth daily.   vitamin C 500 MG tablet Commonly known as:  ASCORBIC ACID Take 500 mg by mouth daily.   Vitamin D 2000 units tablet Take 2,000 Units by mouth daily.       Immunizations: Immunization History  Administered Date(s) Administered  . H1N1 02/28/2008  . Influenza Whole 10/03/2012  . Influenza, High Dose Seasonal PF 10/04/2013, 10/03/2014  . Pneumococcal Conjugate-13 04/25/2013     Physical Exam: Vitals:   02/29/16 1226  BP: 115/60  Pulse: 82  Resp: 16  Temp: 97.6 F (36.4 C)  TempSrc: Oral  SpO2: 98%  Weight: 155 lb (70.3 kg)  Height: 5\' 10"  (1.778 m)     Body mass index is 22.24 kg/m.  General- elderly male, well built, in no acute distress Head- normocephalic, atraumatic Nose- no nasal discharge Throat- moist mucus membrane, normal oropharynx Eyes- PERRLA, EOMI, no pallor, no icterus, no discharge, normal conjunctiva, normal sclera Neck- no cervical lymphadenopathy Cardiovascular- normal s1,s2, no murmur Respiratory- bilateral clear to auscultation, no wheeze, no rhonchi, no crackles, no use of accessory muscles Abdomen- bowel sounds present, soft, non tender, no guarding or rigidity, no CVA tenderness Musculoskeletal- able to move all 4 extremities, generalized weakness, 1+ leg edema, limited extension and cervical region due to pain Neurological- alert and oriented to self only Skin- warm and dry, resolving ecchymosis to left chest wall area, hematoma to left chest wall Psychiatry- normal mood and affect    Labs reviewed: Basic Metabolic Panel:  Recent Labs  02/25/16 0818 02/26/16 0547 02/27/16 0531  NA 140 142 139  K 3.4* 3.5 3.3*  CL 105 107 105  CO2 31 30 29   GLUCOSE 113* 101* 96  BUN 31* 27* 19  CREATININE  0.73 0.64 0.47*  CALCIUM 8.0* 7.7* 7.8*   Liver Function Tests:  Recent Labs  02/25/16 0818 02/26/16 0547 02/27/16 0531  AST 46* 103* 86*  ALT 29 73* 75*  ALKPHOS 57 55 59  BILITOT 1.4* 1.2 1.4*  PROT 5.7* 5.3* 5.4*  ALBUMIN 2.9* 2.5* 2.5*   No results for input(s): LIPASE, AMYLASE in the last 8760 hours. No results for input(s): AMMONIA in the last 8760 hours. CBC:  Recent Labs  02/25/16 0818 02/26/16 0547 02/26/16 2046 02/27/16 0531  WBC 12.3* 10.0  --  8.5  NEUTROABS 9.1* 7.3  --  6.1  HGB 8.3* 7.4* 9.9* 10.3*  HCT 23.9* 21.1* 29.3* 30.1*  MCV 94.5 95.0  --  87.2  PLT 173 160  --  177   Cardiac Enzymes: No results for input(s): CKTOTAL, CKMB, CKMBINDEX, TROPONINI in the last 8760 hours. BNP: Invalid input(s): POCBNP CBG:  Recent Labs  02/06/16 2135  GLUCAP 95     Radiological Exams: Dg Chest 2 View  Result Date: 02/07/2016 CLINICAL DATA:  Fall with cough and congestion EXAM: CHEST  2 VIEW COMPARISON:  05/05/2009 FINDINGS: Post sternotomy changes and valvular replacement. The lungs are hyperinflated. No acute infiltrate or effusion. No pneumothorax. Cardiomediastinal silhouette stable with atherosclerosis. Possible right second rib deformity. IMPRESSION: 1. No acute infiltrate effusion or pneumothorax. 2. Questionable rib Electronically Signed   By: Donavan Foil M.D.   On: 02/07/2016 00:59   Ct Head Wo Contrast  Result Date: 02/24/2016 CLINICAL DATA:  Lethargy. Patient fell 2 weeks ago hitting left side. EXAM: CT HEAD WITHOUT CONTRAST TECHNIQUE: Contiguous axial images were obtained from the base of the skull through the vertex without intravenous contrast. COMPARISON:  02/06/2016 CT FINDINGS: BRAIN: There is mild-to-moderate sulcal and ventricular prominence consistent with superficial and central atrophy. No intraparenchymal hemorrhage, mass effect nor midline shift. Periventricular and subcortical white matter hypodensities consistent with chronic small vessel ischemic disease are identified. No acute large vascular territory infarcts. No abnormal extra-axial fluid collections. Basal cisterns are not effaced and midline. VASCULAR: Moderate calcific atherosclerosis of the carotid siphons. SKULL: No skull fracture. No significant scalp soft tissue swelling. SINUSES/ORBITS: The mastoid air-cells are clear. The included paranasal sinuses are well-aerated.The included ocular globes and orbital contents are non-suspicious. OTHER: None. IMPRESSION: Mild to moderate superficial and central atrophy with chronic small vessel ischemic disease of periventricular and subcortical white matter. No acute intracranial abnormality. Electronically Signed   By: Ashley Royalty M.D.   On: 02/24/2016 17:48   Ct Head Wo Contrast  Result Date: 02/07/2016 CLINICAL DATA:  Unwitnessed  fall from bed today.  Anticoagulated. EXAM: CT HEAD WITHOUT CONTRAST CT CERVICAL SPINE WITHOUT CONTRAST TECHNIQUE: Multidetector CT imaging of the head and cervical spine was performed following the standard protocol without intravenous contrast. Multiplanar CT image reconstructions of the cervical spine were also generated. COMPARISON:  None. FINDINGS: CT HEAD FINDINGS Brain: There is no intracranial hemorrhage, mass or evidence of acute infarction. There is moderate generalized atrophy. There is moderate chronic microvascular ischemic change. There is no significant extra-axial fluid collection. No acute intracranial findings are evident. Vascular: No hyperdense vessel or unexpected calcification. Skull: Normal. Negative for fracture or focal lesion. Sinuses/Orbits: No acute finding. Other: None. CT CERVICAL SPINE FINDINGS Alignment: Normal. Skull base and vertebrae: No acute fracture. No primary bone lesion or focal pathologic process. Soft tissues and spinal canal: No prevertebral fluid or swelling. No visible canal hematoma. Disc levels: Moderate degenerative cervical disc disease, greatest at  C5-6 and C6-7. The facet articulations are intact and well preserved. Upper chest: Negative. Other: None IMPRESSION: 1. No acute intracranial findings. There is moderate generalized atrophy and chronic appearing white matter hypodensities which likely represent small vessel ischemic disease. 2. Negative for acute cervical spine fracture Electronically Signed   By: Andreas Newport M.D.   On: 02/07/2016 00:07   Ct Chest W Contrast  Result Date: 02/24/2016 CLINICAL DATA:  Pt comes from [facility] ([place]). Staff sts pt was very lethargic today, pt dry, very thirsty per EMS. Chronic back pain per EMS. Pt had fall two weeks ago hit left side EXAM: CT CHEST, ABDOMEN, AND PELVIS WITH CONTRAST TECHNIQUE: Multidetector CT imaging of the chest, abdomen and pelvis was performed following the standard protocol  during bolus administration of intravenous contrast. CONTRAST:  135mL ISOVUE-300 IOPAMIDOL (ISOVUE-300) INJECTION 61% COMPARISON:  None. FINDINGS: CT CHEST FINDINGS Cardiovascular: No significant vascular findings. Normal heart size. No pericardial effusion. Thoracic aorta is normal in caliber. No thoracic aortic dissection. Thoracic aortic atherosclerosis. Prior CABG. Mediastinum/Nodes: No enlarged mediastinal, hilar, or axillary lymph nodes. Thyroid gland, trachea, and esophagus demonstrate no significant findings. Lungs/Pleura: Small left pleural effusion. No focal consolidation or pneumothorax. Musculoskeletal: No acute osseous abnormality. Large heterogeneous mass measuring 9.5 x 7.6 x 13.4 cm deep to the left pectoralis major muscle and may be centered within the left pectoralis minor muscle. There is expansion of the intercostal muscle between the left second and third rib adjacent to the mass resulting in a convex margin along the pleural surface and a similar of fact on the adjacent third and fourth intercostal space. There is expansion of the left serratus anterior muscle with adjacent inflammatory changes. There is expansion of the left latissimus muscle with surrounding inflammatory changes. Small amount hemorrhagic fluid tracks along the left lateral chest wall and into the left lateral abdominal wall. CT ABDOMEN PELVIS FINDINGS Hepatobiliary: No focal liver abnormality is seen. No gallstones, gallbladder wall thickening, or biliary dilatation. Pancreas: Unremarkable. No pancreatic ductal dilatation or surrounding inflammatory changes. Spleen: Normal in size without focal abnormality. Adrenals/Urinary Tract: Adrenal glands are unremarkable. Kidneys are normal, without renal calculi, focal lesion, or hydronephrosis. Bladder is unremarkable. Stomach/Bowel: Stomach is within normal limits. Extensive sigmoid diverticulosis without evidence of diverticulitis. No evidence of bowel wall thickening, distention,  or inflammatory changes. Vascular/Lymphatic: Normal caliber abdominal aorta with atherosclerosis. Reproductive: Prostate enlargement impressing on the bladder base. Other: No abdominal wall hernia or abnormality. No abdominopelvic ascites. Musculoskeletal: Severe degenerative disc disease with disc height loss at L4-5 and L5-S1 with bilateral facet arthropathy. Generalized osteopenia. No acute osseous abnormality. IMPRESSION: 1. Large heterogeneous mass measuring 9.5 x 7.6 x 13.4 cm deep to the left pectoralis major muscle and may be centered within the left pectoralis minor muscle likely reflecting a large intramuscular hematoma. Expansion of the intercostal muscle between the left second and third rib adjacent to the mass resulting in a convex margin along the pleural surface and a similar affect on the adjacent third and fourth intercostal muscle likely also reflecting an intramuscular hematoma. 2. Expansion of the left serratus anterior and latissimus muscles with adjacent inflammatory changes consistent with intramuscular hematomas. 3. Close clinical follow-up is recommended to document complete resolution. In the absence of resolution, follow-up imaging is recommended to evaluate for in the underlying pathology. Electronically Signed   By: Kathreen Devoid   On: 02/24/2016 17:54   Ct Cervical Spine Wo Contrast  Result Date: 02/07/2016 CLINICAL DATA:  Unwitnessed  fall from bed today.  Anticoagulated. EXAM: CT HEAD WITHOUT CONTRAST CT CERVICAL SPINE WITHOUT CONTRAST TECHNIQUE: Multidetector CT imaging of the head and cervical spine was performed following the standard protocol without intravenous contrast. Multiplanar CT image reconstructions of the cervical spine were also generated. COMPARISON:  None. FINDINGS: CT HEAD FINDINGS Brain: There is no intracranial hemorrhage, mass or evidence of acute infarction. There is moderate generalized atrophy. There is moderate chronic microvascular ischemic change. There is  no significant extra-axial fluid collection. No acute intracranial findings are evident. Vascular: No hyperdense vessel or unexpected calcification. Skull: Normal. Negative for fracture or focal lesion. Sinuses/Orbits: No acute finding. Other: None. CT CERVICAL SPINE FINDINGS Alignment: Normal. Skull base and vertebrae: No acute fracture. No primary bone lesion or focal pathologic process. Soft tissues and spinal canal: No prevertebral fluid or swelling. No visible canal hematoma. Disc levels: Moderate degenerative cervical disc disease, greatest at C5-6 and C6-7. The facet articulations are intact and well preserved. Upper chest: Negative. Other: None IMPRESSION: 1. No acute intracranial findings. There is moderate generalized atrophy and chronic appearing white matter hypodensities which likely represent small vessel ischemic disease. 2. Negative for acute cervical spine fracture Electronically Signed   By: Andreas Newport M.D.   On: 02/07/2016 00:07   Ct Abdomen Pelvis W Contrast  Result Date: 02/24/2016 CLINICAL DATA:  Pt comes from [facility] ([place]). Staff sts pt was very lethargic today, pt dry, very thirsty per EMS. Chronic back pain per EMS. Pt had fall two weeks ago hit left side EXAM: CT CHEST, ABDOMEN, AND PELVIS WITH CONTRAST TECHNIQUE: Multidetector CT imaging of the chest, abdomen and pelvis was performed following the standard protocol during bolus administration of intravenous contrast. CONTRAST:  162mL ISOVUE-300 IOPAMIDOL (ISOVUE-300) INJECTION 61% COMPARISON:  None. FINDINGS: CT CHEST FINDINGS Cardiovascular: No significant vascular findings. Normal heart size. No pericardial effusion. Thoracic aorta is normal in caliber. No thoracic aortic dissection. Thoracic aortic atherosclerosis. Prior CABG. Mediastinum/Nodes: No enlarged mediastinal, hilar, or axillary lymph nodes. Thyroid gland, trachea, and esophagus demonstrate no significant findings. Lungs/Pleura: Small left  pleural effusion. No focal consolidation or pneumothorax. Musculoskeletal: No acute osseous abnormality. Large heterogeneous mass measuring 9.5 x 7.6 x 13.4 cm deep to the left pectoralis major muscle and may be centered within the left pectoralis minor muscle. There is expansion of the intercostal muscle between the left second and third rib adjacent to the mass resulting in a convex margin along the pleural surface and a similar of fact on the adjacent third and fourth intercostal space. There is expansion of the left serratus anterior muscle with adjacent inflammatory changes. There is expansion of the left latissimus muscle with surrounding inflammatory changes. Small amount hemorrhagic fluid tracks along the left lateral chest wall and into the left lateral abdominal wall. CT ABDOMEN PELVIS FINDINGS Hepatobiliary: No focal liver abnormality is seen. No gallstones, gallbladder wall thickening, or biliary dilatation. Pancreas: Unremarkable. No pancreatic ductal dilatation or surrounding inflammatory changes. Spleen: Normal in size without focal abnormality. Adrenals/Urinary Tract: Adrenal glands are unremarkable. Kidneys are normal, without renal calculi, focal lesion, or hydronephrosis. Bladder is unremarkable. Stomach/Bowel: Stomach is within normal limits. Extensive sigmoid diverticulosis without evidence of diverticulitis. No evidence of bowel wall thickening, distention, or inflammatory changes. Vascular/Lymphatic: Normal caliber abdominal aorta with atherosclerosis. Reproductive: Prostate enlargement impressing on the bladder base. Other: No abdominal wall hernia or abnormality. No abdominopelvic ascites. Musculoskeletal: Severe degenerative disc disease with disc height loss at L4-5 and L5-S1 with bilateral facet arthropathy.  Generalized osteopenia. No acute osseous abnormality. IMPRESSION: 1. Large heterogeneous mass measuring 9.5 x 7.6 x 13.4 cm deep to the left pectoralis major muscle and may be  centered within the left pectoralis minor muscle likely reflecting a large intramuscular hematoma. Expansion of the intercostal muscle between the left second and third rib adjacent to the mass resulting in a convex margin along the pleural surface and a similar affect on the adjacent third and fourth intercostal muscle likely also reflecting an intramuscular hematoma. 2. Expansion of the left serratus anterior and latissimus muscles with adjacent inflammatory changes consistent with intramuscular hematomas. 3. Close clinical follow-up is recommended to document complete resolution. In the absence of resolution, follow-up imaging is recommended to evaluate for in the underlying pathology. Electronically Signed   By: Kathreen Devoid   On: 02/24/2016 17:54    Assessment/Plan  Generalized weakness With physical deconditioning. Will have him work with physical therapy and occupational therapy team to help with gait training and muscle strengthening exercises.fall precautions. Skin care. Encourage to be out of bed.   Left chest wall hematoma With supratherapeutic INR. Required FFP and vitamin K in the hospital. Also received PRBC in the hospital. Monitor H&H. Monitor for signs of bleed. Breathing currently stable. Denies any pain this visit. Continue Tylenol 500 mg every 6 hours as needed for pain.  Acute blood loss anemia Status post 2 unit packed red blood cell transfusion in the hospital. Monitor H&H. Continue ferrous sulfate 325 mg 3 times a day  Urinary tract infection Continue and complete course of cefpodoxime on 200 mg twice a day on 03/05/2016. Maintain hydration and perineal hygiene.  Atrial flutter Controlled heart rate. Continue metoprolol tartrate 12.5 mg daily. Hold Coumadin for now. Check INR second of February 2018. If INR is below 2, resume Coumadin.   Dementia without behavioral disturbance Provide supportive care. Continue donepezil 10 mg daily and Namenda 10 mg twice a day. Once  patient restores his strength back, he will need to go to a skilled nursing facility for long-term care for safe discharge plan. SLP consult.  Coronary artery disease Remains chest pain-free. Continue his beta blocker, statin.  Hypokalemia Check BMP   Goals of care: short term rehabilitation   Labs/tests ordered: cbc, bmp, inr 03/04/16  Family/ staff Communication: reviewed care plan with patient and nursing supervisor    Blanchie Serve, MD Internal Medicine [place], [place] [address] Cell Phone (Monday-Friday 8 am - 5 pm): [phone] On Call: [phone] and follow prompts after 5 pm and on weekends Office Phone: [phone] Office Fax: [phone]

## 2016-03-04 ENCOUNTER — Other Ambulatory Visit: Payer: Self-pay | Admitting: Family Medicine

## 2016-03-04 LAB — CBC AND DIFFERENTIAL
HEMATOCRIT: 35 % — AB (ref 41–53)
HEMOGLOBIN: 11.3 g/dL — AB (ref 13.5–17.5)
Neutrophils Absolute: 6 /uL
Platelets: 351 10*3/uL (ref 150–399)
WBC: 9.8 10^3/mL

## 2016-03-04 LAB — BASIC METABOLIC PANEL
BUN: 11 mg/dL (ref 4–21)
CREATININE: 0.8 mg/dL (ref 0.6–1.3)
Glucose: 95 mg/dL
POTASSIUM: 4.2 mmol/L (ref 3.4–5.3)
Sodium: 146 mmol/L (ref 137–147)

## 2016-03-08 ENCOUNTER — Non-Acute Institutional Stay (SKILLED_NURSING_FACILITY): Payer: Medicare Other | Admitting: Adult Health

## 2016-03-08 ENCOUNTER — Encounter: Payer: Self-pay | Admitting: Adult Health

## 2016-03-08 DIAGNOSIS — R531 Weakness: Secondary | ICD-10-CM

## 2016-03-08 DIAGNOSIS — S20212D Contusion of left front wall of thorax, subsequent encounter: Secondary | ICD-10-CM

## 2016-03-08 DIAGNOSIS — D62 Acute posthemorrhagic anemia: Secondary | ICD-10-CM

## 2016-03-08 DIAGNOSIS — F0280 Dementia in other diseases classified elsewhere without behavioral disturbance: Secondary | ICD-10-CM

## 2016-03-08 DIAGNOSIS — I2510 Atherosclerotic heart disease of native coronary artery without angina pectoris: Secondary | ICD-10-CM

## 2016-03-08 DIAGNOSIS — G309 Alzheimer's disease, unspecified: Secondary | ICD-10-CM | POA: Diagnosis not present

## 2016-03-08 DIAGNOSIS — I4892 Unspecified atrial flutter: Secondary | ICD-10-CM | POA: Diagnosis not present

## 2016-03-08 NOTE — Progress Notes (Signed)
DATE:  03/08/2016   MRN:  [id number]  BIRTHDAY: Apr 08, 1930  Facility:  Nursing Home Location:  [place] Room Number: 105-P  LEVEL OF CARE:  SNF (31)  Contact Information    Name Relation Home Work [place] Spouse [phone]     [place] Daughter   [phone]       Code Status History    Date Active Date Inactive Code Status Order ID Comments User Context   02/24/2016  9:10 PM 02/27/2016  9:06 PM Full Code [id number]  Reubin Milan, MD Inpatient       Chief Complaint  Patient presents with  . Discharge Note    HISTORY OF PRESENT ILLNESS:  This is an 43-YO male seen for a discharge visit.  He discharging to home on 03/09/2016 with home health PT, OT,  and CNA services.    He has been admitted to [facility] on 02/27/16 from [facility] admission 02/24/16 through 02/27/16 S/P fall with chest wall hematoma and supratherapeutic INR. He was given vitamin K and fresh frozen plasma. He had a drop in his hemoglobin and required transportation of  2 units of packed RBC. He was also treated for UTI with antibiotics. His Coumadin was held and restarted on 03/04/16 with hemoglobin 11.3.  Patient was admitted to this facility for short-term rehabilitation after the patient's recent hospitalization.  Patient has completed SNF rehabilitation and therapy has cleared the patient for discharge.   PAST MEDICAL HISTORY:  Past Medical History:  Diagnosis Date  . Aortic insufficiency and aortic stenosis   . Aortic stenosis    SEVERE  . CAD (coronary artery disease)   . Heart murmur   . Hyperlipidemia   . Hypertension   . Mild memory disturbance      CURRENT MEDICATIONS: Reviewed  Patient's Medications  New Prescriptions   No medications on file  Previous Medications   ACETAMINOPHEN (TYLENOL) 500 MG TABLET    Take 500 mg by mouth every 6 (six) hours as needed (for pain).    ATORVASTATIN (LIPITOR) 40 MG TABLET    Take 40  mg by mouth daily.    CALCIUM CARBONATE (OS-CAL) 600 MG TABS    Take 600 mg by mouth 2 (two) times daily with a meal.     CHOLECALCIFEROL (VITAMIN D) 2000 UNITS TABLET    Take 2,000 Units by mouth daily.     DONEPEZIL (ARICEPT) 10 MG TABLET    TAKE 1 TABLET (10 MG TOTAL) BY MOUTH AT BEDTIME.   FERROUS SULFATE 325 (65 FE) MG TABLET    Take 1 tablet (325 mg total) by mouth 3 (three) times daily with meals.   MEMANTINE (NAMENDA) 10 MG TABLET    TAKE   1 TABLET (10MG ) BY MOUTH TWICE DAILY   METOPROLOL TARTRATE (LOPRESSOR) 25 MG TABLET    Take 0.5 tablets (12.5 mg total) by mouth daily.   MULTIPLE VITAMIN (MULTIVITAMIN) TABLET    Take 1 tablet by mouth daily.     VITAMIN C (ASCORBIC ACID) 500 MG TABLET    Take 500 mg by mouth daily.     WARFARIN (COUMADIN) 7.5 MG TABLET    Take 7.5 mg by mouth daily.  Modified Medications   No medications on file  Discontinued Medications   CEFPODOXIME (VANTIN) 200 MG TABLET    Take 1 tablet (200 mg total) by mouth 2 (two) times daily.     Allergies  Allergen  Reactions  . Penicillins Other (See Comments)    Has patient had a PCN reaction causing immediate rash, facial/tongue/throat swelling, SOB or lightheadedness with hypotension: Unknown Has patient had a PCN reaction causing severe rash involving mucus membranes or skin necrosis: Unknown Has patient had a PCN reaction that required hospitalization: Unknown Has patient had a PCN reaction occurring within the last 10 years: Unknown If all of the above answers are "NO", then may proceed with Cephalosporin use.      REVIEW OF SYSTEMS:  GENERAL: no change in appetite, no fatigue, no weight changes, no fever, chills or weakness EYES: Denies change in vision, dry eyes, eye pain, itching or discharge EARS: Denies change in hearing, ringing in ears, or earache NOSE: Denies nasal congestion or epistaxis MOUTH and THROAT: Denies oral discomfort, gingival pain or bleeding, pain from teeth or hoarseness     RESPIRATORY: no cough, SOB, DOE, wheezing, hemoptysis CARDIAC: no chest pain, edema or palpitations GI: no abdominal pain, diarrhea, constipation, heart burn, nausea or vomiting GU: Denies dysuria, frequency, hematuria, incontinence, or discharge PSYCHIATRIC: Denies feeling of depression or anxiety. No report of hallucinations, insomnia, paranoia, or agitation    PHYSICAL EXAMINATION  GENERAL APPEARANCE: Well nourished. In no acute distress. Normal body habitus SKIN:  Skin is warm and dry. Hematoma on left chest wall, bruising on left lateral chest wall HEAD: Normal in size and contour. No evidence of trauma EYES: Lids open and close normally. No blepharitis, entropion or ectropion. PERRL. Conjunctivae are clear and sclerae are white. Lenses are without opacity EARS: Pinnae are normal. Patient hears normal voice tunes of the examiner MOUTH and THROAT: Lips are without lesions. Oral mucosa is moist and without lesions. Tongue is normal in shape, size, and color and without lesions NECK: supple, trachea midline, no neck masses, no thyroid tenderness, no thyromegaly LYMPHATICS: no LAN in the neck, no supraclavicular LAN RESPIRATORY: breathing is even & unlabored, BS CTAB CARDIAC: RRR, no murmur,no extra heart sounds, no edema GI: abdomen soft, normal BS, no masses, no tenderness, no hepatomegaly, no splenomegaly EXTREMITIES:  Able to move 4 extremities PSYCHIATRIC: Alert to self and disoriented to time and place. Affect and behavior are appropriate   LABS/RADIOLOGY: Labs reviewed: 03/01/16  sodium 146 K4.2 glucose 95 BUN 11 creatinine 0.79 calcium 9.5 GFR >60 WBC 9.8 hemoglobin 11.3 hematocrit 35.1 MCV 94.9 platelet [id number]  Basic Metabolic Panel:  Recent Labs  02/25/16 0818 02/26/16 0547 02/27/16 0531  NA 140 142 139  K 3.4* 3.5 3.3*  CL 105 107 105  CO2 31 30 29   GLUCOSE 113* 101* 96  BUN 31* 27* 19  CREATININE 0.73 0.64 0.47*  CALCIUM 8.0* 7.7* 7.8*   Liver Function  Tests:  Recent Labs  02/25/16 0818 02/26/16 0547 02/27/16 0531  AST 46* 103* 86*  ALT 29 73* 75*  ALKPHOS 57 55 59  BILITOT 1.4* 1.2 1.4*  PROT 5.7* 5.3* 5.4*  ALBUMIN 2.9* 2.5* 2.5*   CBC:  Recent Labs  02/25/16 0818 02/26/16 0547 02/26/16 2046 02/27/16 0531  WBC 12.3* 10.0  --  8.5  NEUTROABS 9.1* 7.3  --  6.1  HGB 8.3* 7.4* 9.9* 10.3*  HCT 23.9* 21.1* 29.3* 30.1*  MCV 94.5 95.0  --  87.2  PLT 173 160  --  177   Lipid Panel:  Recent Labs  03/12/15 1203  HDL 63   CBG:  Recent Labs  02/06/16 2135  GLUCAP 95      Ct Head Wo Contrast  Result Date: 02/24/2016 CLINICAL DATA:  Lethargy. Patient fell 2 weeks ago hitting left side. EXAM: CT HEAD WITHOUT CONTRAST TECHNIQUE: Contiguous axial images were obtained from the base of the skull through the vertex without intravenous contrast. COMPARISON:  02/06/2016 CT FINDINGS: BRAIN: There is mild-to-moderate sulcal and ventricular prominence consistent with superficial and central atrophy. No intraparenchymal hemorrhage, mass effect nor midline shift. Periventricular and subcortical white matter hypodensities consistent with chronic small vessel ischemic disease are identified. No acute large vascular territory infarcts. No abnormal extra-axial fluid collections. Basal cisterns are not effaced and midline. VASCULAR: Moderate calcific atherosclerosis of the carotid siphons. SKULL: No skull fracture. No significant scalp soft tissue swelling. SINUSES/ORBITS: The mastoid air-cells are clear. The included paranasal sinuses are well-aerated.The included ocular globes and orbital contents are non-suspicious. OTHER: None. IMPRESSION: Mild to moderate superficial and central atrophy with chronic small vessel ischemic disease of periventricular and subcortical white matter. No acute intracranial abnormality. Electronically Signed   By: Ashley Royalty M.D.   On: 02/24/2016 17:48   Ct Chest W Contrast  Result Date: 02/24/2016 CLINICAL DATA:   Pt comes [facility] Staff sts pt was very lethargic today, pt dry, very thirsty per EMS. Chronic back pain per EMS. Pt had fall two weeks ago hit left side EXAM: CT CHEST, ABDOMEN, AND PELVIS WITH CONTRAST TECHNIQUE: Multidetector CT imaging of the chest, abdomen and pelvis was performed following the standard protocol during bolus administration of intravenous contrast. CONTRAST:  174mL ISOVUE-300 IOPAMIDOL (ISOVUE-300) INJECTION 61% COMPARISON:  None. FINDINGS: CT CHEST FINDINGS Cardiovascular: No significant vascular findings. Normal heart size. No pericardial effusion. Thoracic aorta is normal in caliber. No thoracic aortic dissection. Thoracic aortic atherosclerosis. Prior CABG. Mediastinum/Nodes: No enlarged mediastinal, hilar, or axillary lymph nodes. Thyroid gland, trachea, and esophagus demonstrate no significant findings. Lungs/Pleura: Small left pleural effusion. No focal consolidation or pneumothorax. Musculoskeletal: No acute osseous abnormality. Large heterogeneous mass measuring 9.5 x 7.6 x 13.4 cm deep to the left pectoralis major muscle and may be centered within the left pectoralis minor muscle. There is expansion of the intercostal muscle between the left second and third rib adjacent to the mass resulting in a convex margin along the pleural surface and a similar of fact on the adjacent third and fourth intercostal space. There is expansion of the left serratus anterior muscle with adjacent inflammatory changes. There is expansion of the left latissimus muscle with surrounding inflammatory changes. Small amount hemorrhagic fluid tracks along the left lateral chest wall and into the left lateral abdominal wall. CT ABDOMEN PELVIS FINDINGS Hepatobiliary: No focal liver abnormality is seen. No gallstones, gallbladder wall thickening, or biliary dilatation. Pancreas: Unremarkable. No pancreatic ductal dilatation or surrounding inflammatory changes. Spleen: Normal in size  without focal abnormality. Adrenals/Urinary Tract: Adrenal glands are unremarkable. Kidneys are normal, without renal calculi, focal lesion, or hydronephrosis. Bladder is unremarkable. Stomach/Bowel: Stomach is within normal limits. Extensive sigmoid diverticulosis without evidence of diverticulitis. No evidence of bowel wall thickening, distention, or inflammatory changes. Vascular/Lymphatic: Normal caliber abdominal aorta with atherosclerosis. Reproductive: Prostate enlargement impressing on the bladder base. Other: No abdominal wall hernia or abnormality. No abdominopelvic ascites. Musculoskeletal: Severe degenerative disc disease with disc height loss at L4-5 and L5-S1 with bilateral facet arthropathy. Generalized osteopenia. No acute osseous abnormality. IMPRESSION: 1. Large heterogeneous mass measuring 9.5 x 7.6 x 13.4 cm deep to the left pectoralis major muscle and may be centered within the left pectoralis minor muscle likely reflecting a large intramuscular hematoma.  Expansion of the intercostal muscle between the left second and third rib adjacent to the mass resulting in a convex margin along the pleural surface and a similar affect on the adjacent third and fourth intercostal muscle likely also reflecting an intramuscular hematoma. 2. Expansion of the left serratus anterior and latissimus muscles with adjacent inflammatory changes consistent with intramuscular hematomas. 3. Close clinical follow-up is recommended to document complete resolution. In the absence of resolution, follow-up imaging is recommended to evaluate for in the underlying pathology. Electronically Signed   By: Kathreen Devoid   On: 02/24/2016 17:54   Ct Abdomen Pelvis W Contrast  Result Date: 02/24/2016 CLINICAL DATA:  Pt comes from [facility] ([place]). Staff sts pt was very lethargic today, pt dry, very thirsty per EMS. Chronic back pain per EMS. Pt had fall two weeks ago hit left side EXAM: CT CHEST, ABDOMEN, AND  PELVIS WITH CONTRAST TECHNIQUE: Multidetector CT imaging of the chest, abdomen and pelvis was performed following the standard protocol during bolus administration of intravenous contrast. CONTRAST:  17mL ISOVUE-300 IOPAMIDOL (ISOVUE-300) INJECTION 61% COMPARISON:  None. FINDINGS: CT CHEST FINDINGS Cardiovascular: No significant vascular findings. Normal heart size. No pericardial effusion. Thoracic aorta is normal in caliber. No thoracic aortic dissection. Thoracic aortic atherosclerosis. Prior CABG. Mediastinum/Nodes: No enlarged mediastinal, hilar, or axillary lymph nodes. Thyroid gland, trachea, and esophagus demonstrate no significant findings. Lungs/Pleura: Small left pleural effusion. No focal consolidation or pneumothorax. Musculoskeletal: No acute osseous abnormality. Large heterogeneous mass measuring 9.5 x 7.6 x 13.4 cm deep to the left pectoralis major muscle and may be centered within the left pectoralis minor muscle. There is expansion of the intercostal muscle between the left second and third rib adjacent to the mass resulting in a convex margin along the pleural surface and a similar of fact on the adjacent third and fourth intercostal space. There is expansion of the left serratus anterior muscle with adjacent inflammatory changes. There is expansion of the left latissimus muscle with surrounding inflammatory changes. Small amount hemorrhagic fluid tracks along the left lateral chest wall and into the left lateral abdominal wall. CT ABDOMEN PELVIS FINDINGS Hepatobiliary: No focal liver abnormality is seen. No gallstones, gallbladder wall thickening, or biliary dilatation. Pancreas: Unremarkable. No pancreatic ductal dilatation or surrounding inflammatory changes. Spleen: Normal in size without focal abnormality. Adrenals/Urinary Tract: Adrenal glands are unremarkable. Kidneys are normal, without renal calculi, focal lesion, or hydronephrosis. Bladder is unremarkable. Stomach/Bowel: Stomach is  within normal limits. Extensive sigmoid diverticulosis without evidence of diverticulitis. No evidence of bowel wall thickening, distention, or inflammatory changes. Vascular/Lymphatic: Normal caliber abdominal aorta with atherosclerosis. Reproductive: Prostate enlargement impressing on the bladder base. Other: No abdominal wall hernia or abnormality. No abdominopelvic ascites. Musculoskeletal: Severe degenerative disc disease with disc height loss at L4-5 and L5-S1 with bilateral facet arthropathy. Generalized osteopenia. No acute osseous abnormality. IMPRESSION: 1. Large heterogeneous mass measuring 9.5 x 7.6 x 13.4 cm deep to the left pectoralis major muscle and may be centered within the left pectoralis minor muscle likely reflecting a large intramuscular hematoma. Expansion of the intercostal muscle between the left second and third rib adjacent to the mass resulting in a convex margin along the pleural surface and a similar affect on the adjacent third and fourth intercostal muscle likely also reflecting an intramuscular hematoma. 2. Expansion of the left serratus anterior and latissimus muscles with adjacent inflammatory changes consistent with intramuscular hematomas. 3. Close clinical follow-up is recommended to document complete resolution. In the absence of  resolution, follow-up imaging is recommended to evaluate for in the underlying pathology. Electronically Signed   By: Kathreen Devoid   On: 02/24/2016 17:54    ASSESSMENT/PLAN:  Generalized weakness - for home health PT, OT and CNA, for therapeutic strengthening exercises; fall precautions  Left chest wall hematoma - Coumadin was restarted on 03/04/16; no complaints of pain on her left chest wall; no new bruising  Acute blood loss anemia - S/P transfusion of 2 units packed RBC and fresh frozen plasma, latest hemoglobin 11.3 , improved, discontinue ferrous sulfate  Atrial flutter - rate controlled; continue metoprolol tartrate 12.5 mg by mouth  daily and Coumadin 7.5 mg by mouth daily  Dementia without behavioral disturbance - continue donepezil 10 mg 1 tab by mouth daily and Namenda 10 mg 1 tab by mouth twice a day; continue supportive care  Coronary artery disease - no complaints of chest pain; continue metoprolol and atorvastatin      I have filled out patient's discharge paperwork and written prescriptions.  Patient will receive home health PT, OT and CNA.  DME provided:  Standard rolling walker  Total discharge time: Greater than 30 minutes Greater than 50% was spent in counseling and coordination of care with the patient.   Discharge time involved coordination of the discharge process with social worker, nursing staff and therapy department. Medical justification for home health services/DME verified.      Monina C. [place] - NP [employer] [phone]

## 2016-03-11 DIAGNOSIS — D62 Acute posthemorrhagic anemia: Secondary | ICD-10-CM | POA: Diagnosis not present

## 2016-03-11 DIAGNOSIS — Z7901 Long term (current) use of anticoagulants: Secondary | ICD-10-CM | POA: Diagnosis not present

## 2016-03-11 DIAGNOSIS — R296 Repeated falls: Secondary | ICD-10-CM | POA: Diagnosis not present

## 2016-03-11 DIAGNOSIS — I10 Essential (primary) hypertension: Secondary | ICD-10-CM | POA: Diagnosis not present

## 2016-03-11 DIAGNOSIS — R531 Weakness: Secondary | ICD-10-CM | POA: Diagnosis not present

## 2016-03-11 DIAGNOSIS — Z9181 History of falling: Secondary | ICD-10-CM | POA: Diagnosis not present

## 2016-03-11 DIAGNOSIS — Z87891 Personal history of nicotine dependence: Secondary | ICD-10-CM | POA: Diagnosis not present

## 2016-03-11 DIAGNOSIS — I2510 Atherosclerotic heart disease of native coronary artery without angina pectoris: Secondary | ICD-10-CM | POA: Diagnosis not present

## 2016-03-11 DIAGNOSIS — R2689 Other abnormalities of gait and mobility: Secondary | ICD-10-CM | POA: Diagnosis not present

## 2016-03-11 DIAGNOSIS — I4892 Unspecified atrial flutter: Secondary | ICD-10-CM | POA: Diagnosis not present

## 2016-03-11 DIAGNOSIS — F0390 Unspecified dementia without behavioral disturbance: Secondary | ICD-10-CM | POA: Diagnosis not present

## 2016-03-11 DIAGNOSIS — E785 Hyperlipidemia, unspecified: Secondary | ICD-10-CM | POA: Diagnosis not present

## 2016-03-11 DIAGNOSIS — I350 Nonrheumatic aortic (valve) stenosis: Secondary | ICD-10-CM | POA: Diagnosis not present

## 2016-03-14 ENCOUNTER — Telehealth: Payer: Self-pay | Admitting: Family Medicine

## 2016-03-14 ENCOUNTER — Ambulatory Visit (INDEPENDENT_AMBULATORY_CARE_PROVIDER_SITE_OTHER): Payer: Medicare Other | Admitting: General Practice

## 2016-03-14 DIAGNOSIS — Z952 Presence of prosthetic heart valve: Secondary | ICD-10-CM | POA: Diagnosis not present

## 2016-03-14 DIAGNOSIS — I4892 Unspecified atrial flutter: Secondary | ICD-10-CM | POA: Diagnosis not present

## 2016-03-14 DIAGNOSIS — Z5181 Encounter for therapeutic drug level monitoring: Secondary | ICD-10-CM | POA: Diagnosis not present

## 2016-03-14 LAB — POCT INR: INR: 2.4

## 2016-03-14 NOTE — Telephone Encounter (Signed)
OK 

## 2016-03-14 NOTE — Patient Instructions (Signed)
Pre visit review using our clinic review tool, if applicable. No additional management support is needed unless otherwise documented below in the visit note. 

## 2016-03-14 NOTE — Telephone Encounter (Signed)
Patient  needs PT orders for twice a wk for 3 wks. Ok to leave verbal on cell

## 2016-03-15 NOTE — Telephone Encounter (Signed)
Verbal given to Mickel Baas for orders.

## 2016-03-15 NOTE — Telephone Encounter (Signed)
fyi

## 2016-03-16 DIAGNOSIS — I4892 Unspecified atrial flutter: Secondary | ICD-10-CM | POA: Diagnosis not present

## 2016-03-16 DIAGNOSIS — I2510 Atherosclerotic heart disease of native coronary artery without angina pectoris: Secondary | ICD-10-CM | POA: Diagnosis not present

## 2016-03-16 DIAGNOSIS — F0390 Unspecified dementia without behavioral disturbance: Secondary | ICD-10-CM | POA: Diagnosis not present

## 2016-03-16 DIAGNOSIS — I10 Essential (primary) hypertension: Secondary | ICD-10-CM | POA: Diagnosis not present

## 2016-03-16 DIAGNOSIS — R531 Weakness: Secondary | ICD-10-CM | POA: Diagnosis not present

## 2016-03-16 DIAGNOSIS — R2689 Other abnormalities of gait and mobility: Secondary | ICD-10-CM | POA: Diagnosis not present

## 2016-03-18 DIAGNOSIS — R2689 Other abnormalities of gait and mobility: Secondary | ICD-10-CM | POA: Diagnosis not present

## 2016-03-18 DIAGNOSIS — I2510 Atherosclerotic heart disease of native coronary artery without angina pectoris: Secondary | ICD-10-CM | POA: Diagnosis not present

## 2016-03-18 DIAGNOSIS — F0390 Unspecified dementia without behavioral disturbance: Secondary | ICD-10-CM | POA: Diagnosis not present

## 2016-03-18 DIAGNOSIS — I10 Essential (primary) hypertension: Secondary | ICD-10-CM | POA: Diagnosis not present

## 2016-03-18 DIAGNOSIS — R531 Weakness: Secondary | ICD-10-CM | POA: Diagnosis not present

## 2016-03-18 DIAGNOSIS — I4892 Unspecified atrial flutter: Secondary | ICD-10-CM | POA: Diagnosis not present

## 2016-03-21 DIAGNOSIS — I4892 Unspecified atrial flutter: Secondary | ICD-10-CM | POA: Diagnosis not present

## 2016-03-21 DIAGNOSIS — R2689 Other abnormalities of gait and mobility: Secondary | ICD-10-CM | POA: Diagnosis not present

## 2016-03-21 DIAGNOSIS — F0390 Unspecified dementia without behavioral disturbance: Secondary | ICD-10-CM | POA: Diagnosis not present

## 2016-03-21 DIAGNOSIS — I2510 Atherosclerotic heart disease of native coronary artery without angina pectoris: Secondary | ICD-10-CM | POA: Diagnosis not present

## 2016-03-21 DIAGNOSIS — R531 Weakness: Secondary | ICD-10-CM | POA: Diagnosis not present

## 2016-03-21 DIAGNOSIS — I10 Essential (primary) hypertension: Secondary | ICD-10-CM | POA: Diagnosis not present

## 2016-03-22 ENCOUNTER — Telehealth: Payer: Self-pay | Admitting: Family Medicine

## 2016-03-22 MED ORDER — ATORVASTATIN CALCIUM 40 MG PO TABS: 40.0000 mg | ORAL_TABLET | Freq: Every day | ORAL | 2 refills | Status: DC

## 2016-03-22 NOTE — Telephone Encounter (Signed)
OK.  He was D/Ced from hospital on this dose.

## 2016-03-22 NOTE — Telephone Encounter (Signed)
Need clarification on lipitor 10mg  1 time a day that pt was taking and now when he came out of rehab it was changed to lipitor 40 mg 1 time a day pt assist.  If it was changed to 40 mg pls call this in to [employer].  And Martyn Ehrich would like to have a call back.

## 2016-03-22 NOTE — Telephone Encounter (Signed)
Okay to refill the Lipitor 40mg ? Please send back to me to send to pharmacy, I then will forward to [place]. Thanks.

## 2016-03-22 NOTE — Telephone Encounter (Signed)
Marvin Wagner has spoken with pharm and lipitor has been clarified. [name] needs to let md know pt has not been taking his coumadin as directed. Pt only took coumadin on 2-17 and 03-20-16. Pt has a INR schedule for 03-28-16. Please advise

## 2016-03-22 NOTE — Telephone Encounter (Signed)
Medication sent in for patient. Cindy please review annotations below in reference to pts coumadin. Thank you.

## 2016-03-23 DIAGNOSIS — R2689 Other abnormalities of gait and mobility: Secondary | ICD-10-CM | POA: Diagnosis not present

## 2016-03-23 DIAGNOSIS — I10 Essential (primary) hypertension: Secondary | ICD-10-CM | POA: Diagnosis not present

## 2016-03-23 DIAGNOSIS — I2510 Atherosclerotic heart disease of native coronary artery without angina pectoris: Secondary | ICD-10-CM | POA: Diagnosis not present

## 2016-03-23 DIAGNOSIS — R531 Weakness: Secondary | ICD-10-CM | POA: Diagnosis not present

## 2016-03-23 DIAGNOSIS — I4892 Unspecified atrial flutter: Secondary | ICD-10-CM | POA: Diagnosis not present

## 2016-03-23 DIAGNOSIS — F0390 Unspecified dementia without behavioral disturbance: Secondary | ICD-10-CM | POA: Diagnosis not present

## 2016-03-28 ENCOUNTER — Ambulatory Visit (INDEPENDENT_AMBULATORY_CARE_PROVIDER_SITE_OTHER): Payer: Medicare Other

## 2016-03-28 ENCOUNTER — Ambulatory Visit: Payer: Medicare Other | Admitting: Family Medicine

## 2016-03-28 ENCOUNTER — Ambulatory Visit: Payer: Medicare Other

## 2016-03-28 DIAGNOSIS — Z5181 Encounter for therapeutic drug level monitoring: Secondary | ICD-10-CM | POA: Diagnosis not present

## 2016-03-28 DIAGNOSIS — Z952 Presence of prosthetic heart valve: Secondary | ICD-10-CM

## 2016-03-28 DIAGNOSIS — I4892 Unspecified atrial flutter: Secondary | ICD-10-CM

## 2016-03-28 LAB — POCT INR: INR: 2.2

## 2016-03-28 NOTE — Patient Instructions (Signed)
Pre visit review using our clinic review tool, if applicable. No additional management support is needed unless otherwise documented below in the visit note. 

## 2016-03-28 NOTE — Telephone Encounter (Addendum)
Patient in today for INR check.  INR was 2.2 and in therapeutic range.  Vania Rea with [name] states that patient may not be taking coumadin correctly; however, appears in range at the moment. Patient was not able to communicate how he takes his coumadin and I am concerned if he does not have anyone to assist him with medications.    Results and instructions faxed to Vania Rea, RN with [employer].    Will forward this to Villa Herb, RN for further monitoring of situation.    Thanks.

## 2016-03-30 ENCOUNTER — Encounter: Payer: Self-pay | Admitting: Family Medicine

## 2016-03-30 ENCOUNTER — Ambulatory Visit (INDEPENDENT_AMBULATORY_CARE_PROVIDER_SITE_OTHER): Payer: Medicare Other | Admitting: Family Medicine

## 2016-03-30 VITALS — BP 100/60 | HR 79 | Ht 70.0 in | Wt 144.5 lb

## 2016-03-30 DIAGNOSIS — I2510 Atherosclerotic heart disease of native coronary artery without angina pectoris: Secondary | ICD-10-CM

## 2016-03-30 DIAGNOSIS — G309 Alzheimer's disease, unspecified: Secondary | ICD-10-CM | POA: Diagnosis not present

## 2016-03-30 DIAGNOSIS — D62 Acute posthemorrhagic anemia: Secondary | ICD-10-CM | POA: Diagnosis not present

## 2016-03-30 DIAGNOSIS — F0390 Unspecified dementia without behavioral disturbance: Secondary | ICD-10-CM | POA: Diagnosis not present

## 2016-03-30 DIAGNOSIS — R531 Weakness: Secondary | ICD-10-CM | POA: Diagnosis not present

## 2016-03-30 DIAGNOSIS — E876 Hypokalemia: Secondary | ICD-10-CM | POA: Diagnosis not present

## 2016-03-30 DIAGNOSIS — F0280 Dementia in other diseases classified elsewhere without behavioral disturbance: Secondary | ICD-10-CM | POA: Diagnosis not present

## 2016-03-30 DIAGNOSIS — S20212A Contusion of left front wall of thorax, initial encounter: Secondary | ICD-10-CM

## 2016-03-30 DIAGNOSIS — I4892 Unspecified atrial flutter: Secondary | ICD-10-CM | POA: Diagnosis not present

## 2016-03-30 DIAGNOSIS — R2689 Other abnormalities of gait and mobility: Secondary | ICD-10-CM | POA: Diagnosis not present

## 2016-03-30 DIAGNOSIS — I10 Essential (primary) hypertension: Secondary | ICD-10-CM | POA: Diagnosis not present

## 2016-03-30 NOTE — Progress Notes (Signed)
Pre visit review using our clinic review tool, if applicable. No additional management support is needed unless otherwise documented below in the visit note. 

## 2016-03-30 NOTE — Progress Notes (Signed)
Subjective:     Patient ID: Marvin Wagner, male   DOB: 21-Jan-1931, 83 y.o.   MRN: [id number]  HPI Patient seen for recent hospital and rehabilitation stay follow-up. He has advanced dementia and is unable to give any significant history. He was admitted on January 24 after apparently having a fall which was unwitnessed. He had left chest wall hematoma and had some anemia related to acute blood loss. His INR was elevated at 7.51. He was given vitamin K and fresh frozen plasma. He had CT of the chest which showed hematomas of the chest wall. His hemoglobin dropped from 13.2 to 7.4 and he was given 2 units packed red blood cells with subsequent hemoglobin 10.3. He had INR 2 days ago which was 2.2. He went to [facility] for rehabilitation and was there for about one week. He is completed some physical therapy.  Had question of UTI as he had some fever and hematuria. Urine culture was negative. He was covered with broad-spectrum antibiotic with vancomycin and cefepime and then switched to Vantin at discharge (from hospital). He had some mild hypokalemia during hospitalization.  Past Medical History:  Diagnosis Date  . Aortic insufficiency and aortic stenosis   . Aortic stenosis    SEVERE  . CAD (coronary artery disease)   . Heart murmur   . Hyperlipidemia   . Hypertension   . Mild memory disturbance    Past Surgical History:  Procedure Laterality Date  . AORTIC VALVE REPLACEMENT  12/17/08  . CARDIAC CATHETERIZATION    . CORONARY ANGIOPLASTY     NORMAL LEFT VENTRICULAR SIZEAND CONTRACTILITY WITH NORMAL SYSTOLIC FUNCTION. EF 65%  . TONSILLECTOMY      reports that he quit smoking about 9 years ago. He has never used smokeless tobacco. He reports that he does not drink alcohol or use drugs. family history includes Ankylosing spondylitis in his brother. Allergies  Allergen Reactions  . Penicillins Other (See Comments)    Has patient had a PCN reaction causing immediate rash,  facial/tongue/throat swelling, SOB or lightheadedness with hypotension: Unknown Has patient had a PCN reaction causing severe rash involving mucus membranes or skin necrosis: Unknown Has patient had a PCN reaction that required hospitalization: Unknown Has patient had a PCN reaction occurring within the last 10 years: Unknown If all of the above answers are "NO", then may proceed with Cephalosporin use.      Review of Systems  Constitutional: Negative for chills and fever.  Genitourinary: Negative for dysuria.  reliable ROS not possible secondary to pt's dementia.     Objective:   Physical Exam  Constitutional: He appears well-developed and well-nourished.  Cardiovascular: Normal rate.   Pulmonary/Chest: Effort normal and breath sounds normal. No respiratory distress. He has no wheezes. He exhibits no tenderness.  Fairly large left chest wall hematoma which is nontender. There is no overlying skin changes. This is about 9 x 10 cm  Abdominal: Soft. There is no tenderness.  Musculoskeletal: He exhibits no edema.  Neurological: He is alert.       Assessment:     #1 advanced dementia  #2 recent left chest wall hematoma  #3 acute blood loss anemia-secondary to chest wall hematoma  #4 chronic Coumadin with recent over anticoagulation  #5 history of recent questionable UTI-with negative urine culture    Plan:     -Check labs today with CBC and basic metabolic panel -Continue close follow-up with Coumadin clinic for INR  -he is getting daily supervision of  medications.  Eulas Post MD [facility] [place]

## 2016-03-31 LAB — BASIC METABOLIC PANEL
BUN: 25 mg/dL — ABNORMAL HIGH (ref 6–23)
CHLORIDE: 101 meq/L (ref 96–112)
CO2: 33 meq/L — AB (ref 19–32)
Calcium: 9.4 mg/dL (ref 8.4–10.5)
Creatinine, Ser: 1.14 mg/dL (ref 0.40–1.50)
GFR: 65.15 mL/min (ref >60.00)
Glucose, Bld: 91 mg/dL (ref 70–99)
POTASSIUM: 4.3 meq/L (ref 3.5–5.1)
SODIUM: 140 meq/L (ref 135–145)

## 2016-03-31 LAB — CBC WITH DIFFERENTIAL/PLATELET
Basophils Absolute: 0.1 10*3/uL (ref 0.0–0.1)
Basophils Relative: 1 % (ref 0.0–3.0)
EOS PCT: 1.2 % (ref 0.0–5.0)
Eosinophils Absolute: 0.1 10*3/uL (ref 0.0–0.7)
HCT: 39 % (ref 39.0–52.0)
Hemoglobin: 13.2 g/dL (ref 13.0–17.0)
LYMPHS ABS: 1.2 10*3/uL (ref 0.7–4.0)
Lymphocytes Relative: 12.6 % (ref 12.0–46.0)
MCHC: 33.8 g/dL (ref 30.0–36.0)
MCV: 97.1 fl (ref 78.0–100.0)
MONOS PCT: 7.2 % (ref 3.0–12.0)
Monocytes Absolute: 0.7 10*3/uL (ref 0.1–1.0)
Neutro Abs: 7.4 10*3/uL (ref 1.4–7.7)
Neutrophils Relative %: 78 % — ABNORMAL HIGH (ref 43.0–77.0)
Platelets: 163 10*3/uL (ref 150.0–400.0)
RBC: 4.02 Mil/uL — AB (ref 4.22–5.81)
RDW: 17.6 % — ABNORMAL HIGH (ref 11.5–15.5)
WBC: 9.4 10*3/uL (ref 4.0–10.5)

## 2016-04-01 DIAGNOSIS — I2510 Atherosclerotic heart disease of native coronary artery without angina pectoris: Secondary | ICD-10-CM | POA: Diagnosis not present

## 2016-04-01 DIAGNOSIS — F0390 Unspecified dementia without behavioral disturbance: Secondary | ICD-10-CM | POA: Diagnosis not present

## 2016-04-01 DIAGNOSIS — I10 Essential (primary) hypertension: Secondary | ICD-10-CM | POA: Diagnosis not present

## 2016-04-01 DIAGNOSIS — R531 Weakness: Secondary | ICD-10-CM | POA: Diagnosis not present

## 2016-04-01 DIAGNOSIS — I4892 Unspecified atrial flutter: Secondary | ICD-10-CM | POA: Diagnosis not present

## 2016-04-01 DIAGNOSIS — R2689 Other abnormalities of gait and mobility: Secondary | ICD-10-CM | POA: Diagnosis not present

## 2016-04-12 ENCOUNTER — Other Ambulatory Visit: Payer: Self-pay | Admitting: Adult Health

## 2016-04-12 ENCOUNTER — Other Ambulatory Visit: Payer: Self-pay | Admitting: Family Medicine

## 2016-04-13 ENCOUNTER — Other Ambulatory Visit: Payer: Self-pay | Admitting: General Practice

## 2016-04-13 MED ORDER — WARFARIN SODIUM 5 MG PO TABS: ORAL_TABLET | ORAL | 3 refills | Status: DC

## 2016-04-18 ENCOUNTER — Ambulatory Visit: Payer: Medicare Other

## 2016-04-18 ENCOUNTER — Ambulatory Visit (INDEPENDENT_AMBULATORY_CARE_PROVIDER_SITE_OTHER): Payer: Medicare Other | Admitting: General Practice

## 2016-04-18 DIAGNOSIS — Z5181 Encounter for therapeutic drug level monitoring: Secondary | ICD-10-CM

## 2016-04-18 DIAGNOSIS — I4892 Unspecified atrial flutter: Secondary | ICD-10-CM

## 2016-04-18 DIAGNOSIS — Z952 Presence of prosthetic heart valve: Secondary | ICD-10-CM

## 2016-04-18 LAB — POCT INR: INR: 1.7

## 2016-04-18 NOTE — Patient Instructions (Signed)
Pre visit review using our clinic review tool, if applicable. No additional management support is needed unless otherwise documented below in the visit note. 

## 2016-05-09 ENCOUNTER — Ambulatory Visit: Payer: Medicare Other

## 2016-05-10 ENCOUNTER — Telehealth: Payer: Self-pay | Admitting: Family Medicine

## 2016-05-10 NOTE — Telephone Encounter (Signed)
° ° °  [name] with [facility] call to say that pt has a rupture blood vessel in right eye and is asking what to do since he is on coumadin   [phone]

## 2016-05-10 NOTE — Telephone Encounter (Signed)
Spoke with [name] and an appointment made

## 2016-05-10 NOTE — Telephone Encounter (Signed)
If he has subconjunctival hemorrhage these are very common and usually very benign.  Would go ahead and recheck INR to make sure he is not supra therapeutic

## 2016-05-10 NOTE — Telephone Encounter (Signed)
Left message on machine for [name] to call us back

## 2016-05-11 ENCOUNTER — Ambulatory Visit (INDEPENDENT_AMBULATORY_CARE_PROVIDER_SITE_OTHER): Payer: Medicare Other | Admitting: General Practice

## 2016-05-11 DIAGNOSIS — Z5181 Encounter for therapeutic drug level monitoring: Secondary | ICD-10-CM

## 2016-05-11 DIAGNOSIS — Z952 Presence of prosthetic heart valve: Secondary | ICD-10-CM

## 2016-05-11 LAB — POCT INR: INR: 1.8

## 2016-05-11 NOTE — Patient Instructions (Signed)
Pre visit review using our clinic review tool, if applicable. No additional management support is needed unless otherwise documented below in the visit note. 

## 2016-05-11 NOTE — Progress Notes (Signed)
I agree with this plan.

## 2016-06-01 ENCOUNTER — Ambulatory Visit (INDEPENDENT_AMBULATORY_CARE_PROVIDER_SITE_OTHER): Payer: Medicare Other | Admitting: General Practice

## 2016-06-01 DIAGNOSIS — Z5181 Encounter for therapeutic drug level monitoring: Secondary | ICD-10-CM | POA: Diagnosis not present

## 2016-06-01 DIAGNOSIS — Z952 Presence of prosthetic heart valve: Secondary | ICD-10-CM

## 2016-06-01 LAB — POCT INR: INR: 2.9

## 2016-06-01 NOTE — Patient Instructions (Signed)
Pre visit review using our clinic review tool, if applicable. No additional management support is needed unless otherwise documented below in the visit note. 

## 2016-07-06 ENCOUNTER — Ambulatory Visit (INDEPENDENT_AMBULATORY_CARE_PROVIDER_SITE_OTHER): Payer: Medicare Other | Admitting: General Practice

## 2016-07-06 DIAGNOSIS — I4892 Unspecified atrial flutter: Secondary | ICD-10-CM

## 2016-07-06 DIAGNOSIS — Z5181 Encounter for therapeutic drug level monitoring: Secondary | ICD-10-CM

## 2016-07-06 DIAGNOSIS — Z952 Presence of prosthetic heart valve: Secondary | ICD-10-CM

## 2016-07-06 LAB — POCT INR: INR: 3.2

## 2016-07-06 NOTE — Patient Instructions (Signed)
Pre visit review using our clinic review tool, if applicable. No additional management support is needed unless otherwise documented below in the visit note. 

## 2016-07-08 ENCOUNTER — Other Ambulatory Visit: Payer: Self-pay | Admitting: Adult Health

## 2016-08-10 ENCOUNTER — Ambulatory Visit (INDEPENDENT_AMBULATORY_CARE_PROVIDER_SITE_OTHER): Payer: Medicare Other | Admitting: General Practice

## 2016-08-10 DIAGNOSIS — I4892 Unspecified atrial flutter: Secondary | ICD-10-CM

## 2016-08-10 DIAGNOSIS — Z952 Presence of prosthetic heart valve: Secondary | ICD-10-CM

## 2016-08-10 DIAGNOSIS — Z5181 Encounter for therapeutic drug level monitoring: Secondary | ICD-10-CM | POA: Diagnosis not present

## 2016-08-10 LAB — POCT INR: INR: 3.1

## 2016-08-10 NOTE — Patient Instructions (Signed)
Pre visit review using our clinic review tool, if applicable. No additional management support is needed unless otherwise documented below in the visit note. 

## 2016-08-23 ENCOUNTER — Other Ambulatory Visit: Payer: Self-pay | Admitting: Adult Health

## 2016-08-25 ENCOUNTER — Other Ambulatory Visit: Payer: Self-pay | Admitting: Family Medicine

## 2016-08-26 ENCOUNTER — Other Ambulatory Visit: Payer: Self-pay | Admitting: General Practice

## 2016-08-26 MED ORDER — WARFARIN SODIUM 5 MG PO TABS: ORAL_TABLET | ORAL | 3 refills | Status: DC

## 2016-09-06 ENCOUNTER — Encounter: Payer: Self-pay | Admitting: Family Medicine

## 2016-09-06 ENCOUNTER — Ambulatory Visit (INDEPENDENT_AMBULATORY_CARE_PROVIDER_SITE_OTHER): Payer: Medicare Other | Admitting: Family Medicine

## 2016-09-06 VITALS — BP 110/70 | HR 100 | Temp 98.0°F | Wt 148.9 lb

## 2016-09-06 DIAGNOSIS — G309 Alzheimer's disease, unspecified: Secondary | ICD-10-CM | POA: Diagnosis not present

## 2016-09-06 DIAGNOSIS — I4892 Unspecified atrial flutter: Secondary | ICD-10-CM | POA: Diagnosis not present

## 2016-09-06 DIAGNOSIS — Z9181 History of falling: Secondary | ICD-10-CM

## 2016-09-06 DIAGNOSIS — I2510 Atherosclerotic heart disease of native coronary artery without angina pectoris: Secondary | ICD-10-CM

## 2016-09-06 DIAGNOSIS — F0280 Dementia in other diseases classified elsewhere without behavioral disturbance: Secondary | ICD-10-CM

## 2016-09-06 DIAGNOSIS — R410 Disorientation, unspecified: Secondary | ICD-10-CM

## 2016-09-06 DIAGNOSIS — E785 Hyperlipidemia, unspecified: Secondary | ICD-10-CM

## 2016-09-06 LAB — HEPATIC FUNCTION PANEL
ALBUMIN: 4.3 g/dL (ref 3.5–5.2)
ALT: 16 U/L (ref 0–53)
AST: 20 U/L (ref 0–37)
Alkaline Phosphatase: 68 U/L (ref 39–117)
Bilirubin, Direct: 0.2 mg/dL (ref 0.0–0.3)
Total Bilirubin: 0.7 mg/dL (ref 0.2–1.2)
Total Protein: 6.9 g/dL (ref 6.0–8.3)

## 2016-09-06 LAB — LIPID PANEL
CHOLESTEROL: 113 mg/dL (ref 0–200)
HDL: 57.5 mg/dL (ref >39.00)
LDL Cholesterol: 41 mg/dL (ref 0–99)
NonHDL: 55.36
Total CHOL/HDL Ratio: 2
Triglycerides: 71 mg/dL (ref 0.0–149.0)
VLDL: 14.2 mg/dL (ref 0.0–40.0)

## 2016-09-06 LAB — BASIC METABOLIC PANEL
BUN: 15 mg/dL (ref 6–23)
CALCIUM: 9.6 mg/dL (ref 8.4–10.5)
CO2: 33 mEq/L — ABNORMAL HIGH (ref 19–32)
CREATININE: 1.12 mg/dL (ref 0.40–1.50)
Chloride: 101 mEq/L (ref 96–112)
GFR: 66.42 mL/min (ref >60.00)
Glucose, Bld: 111 mg/dL — ABNORMAL HIGH (ref 70–99)
POTASSIUM: 4.2 meq/L (ref 3.5–5.1)
Sodium: 141 mEq/L (ref 135–145)

## 2016-09-06 LAB — POCT URINALYSIS DIPSTICK
BILIRUBIN UA: NEGATIVE
Clarity, UA: NEGATIVE
Glucose, UA: NEGATIVE
Ketones, UA: NEGATIVE
Leukocytes, UA: NEGATIVE
NITRITE UA: NEGATIVE
PH UA: 6 (ref 5.0–8.0)
RBC UA: NEGATIVE
Spec Grav, UA: >=1.03 — AB (ref 1.010–1.025)
UROBILINOGEN UA: 0.2 U/dL

## 2016-09-06 LAB — TSH: TSH: 3.59 u[IU]/mL (ref 0.35–4.50)

## 2016-09-06 NOTE — Progress Notes (Signed)
Subjective:     Patient ID: Marvin Wagner, male   DOB: April 21, 1930, 83 y.o.   MRN: [id number]  HPI Patient has advanced dementia along with history of CAD and atrial flutter. He is on chronic Coumadin therapy. Staff concerned because they state that he has seen more "off balance" lately. He had fallen Saturday which was witnessed. No loss of conscious. No head injury. He is not complaining any headaches. They think he is still more confused than usual. He's been talking more about past events over the past several days. This was occurring even before the fall. He's not had any fever. Staff were concerned he may have UTI.  Does not drink a lot of fluids on his own and has to be encouraged. His current medications include Aricept, Namenda, Lipitor, metoprolol, and Coumadin. He is followed regularly through the Coumadin clinic.  He's not had follow-up lipids or chemistries in some time. Last lipids were over year ago  Past Medical History:  Diagnosis Date  . Aortic insufficiency and aortic stenosis   . Aortic stenosis    SEVERE  . CAD (coronary artery disease)   . Heart murmur   . Hyperlipidemia   . Hypertension   . Mild memory disturbance    Past Surgical History:  Procedure Laterality Date  . AORTIC VALVE REPLACEMENT  12/17/08  . CARDIAC CATHETERIZATION    . CORONARY ANGIOPLASTY     NORMAL LEFT VENTRICULAR SIZEAND CONTRACTILITY WITH NORMAL SYSTOLIC FUNCTION. EF 65%  . TONSILLECTOMY      reports that he quit smoking about 10 years ago. He has never used smokeless tobacco. He reports that he does not drink alcohol or use drugs. family history includes Ankylosing spondylitis in his brother. Allergies  Allergen Reactions  . Penicillins Other (See Comments)    Has patient had a PCN reaction causing immediate rash, facial/tongue/throat swelling, SOB or lightheadedness with hypotension: Unknown Has patient had a PCN reaction causing severe rash involving mucus membranes or skin necrosis:  Unknown Has patient had a PCN reaction that required hospitalization: Unknown Has patient had a PCN reaction occurring within the last 10 years: Unknown If all of the above answers are "NO", then may proceed with Cephalosporin use.      Review of Systems No reliable review of systems obtain from patient secondary to advanced dementia    Objective:   Physical Exam  Constitutional: He appears well-developed and well-nourished.  HENT:  Head: Normocephalic and atraumatic.  Right Ear: External ear normal.  Left Ear: External ear normal.  Oropharynx slightly dry otherwise clear  Neck: Neck supple.  Cardiovascular: Normal rate.   Irregular rhythm  Pulmonary/Chest: Effort normal and breath sounds normal. No respiratory distress. He has no wheezes. He has no rales.  Abdominal: Soft. There is no tenderness.  Neurological: He is alert. No cranial nerve deficit.  No focal strength deficits. No obvious ataxia. He is able to get up from chair onto table without assistance No focal weakness       Assessment:     Patient has advanced dementia seen with recent fall and perhaps slightly more off balance than usual. Slightly more confused than usual. Nonfocal neuro exam. Blood pressure today seated 110/70 and standing 100/60. Question mild dehydration.  No obvious ataxia or focal weakness.    Plan:     -Check labs with basic metabolic panel, lipid panel, hepatic panel, urinalysis, TSH -Increase hydration and will be important to have staff encouraged him to drink more -May need  to consider physical therapy and also patient encouraged to use walker and/or cane at all times-though he is declining and will take some encouragement.  Eulas Post MD [facility] at [facility]

## 2016-09-06 NOTE — Patient Instructions (Signed)
Increase hydration- his BP dropped 10 points today sitting to standing. May have some mild dehydration We will call with lab results Call for any fever or other new symptoms.   Use walker if balance continues to be a concern.

## 2016-09-07 ENCOUNTER — Ambulatory Visit (INDEPENDENT_AMBULATORY_CARE_PROVIDER_SITE_OTHER): Payer: Medicare Other | Admitting: General Practice

## 2016-09-07 DIAGNOSIS — I4892 Unspecified atrial flutter: Secondary | ICD-10-CM

## 2016-09-07 DIAGNOSIS — Z952 Presence of prosthetic heart valve: Secondary | ICD-10-CM

## 2016-09-07 DIAGNOSIS — Z5181 Encounter for therapeutic drug level monitoring: Secondary | ICD-10-CM | POA: Diagnosis not present

## 2016-09-07 LAB — POCT INR: INR: 4.3

## 2016-09-07 NOTE — Patient Instructions (Signed)
Pre visit review using our clinic review tool, if applicable. No additional management support is needed unless otherwise documented below in the visit note. 

## 2016-09-13 ENCOUNTER — Ambulatory Visit (INDEPENDENT_AMBULATORY_CARE_PROVIDER_SITE_OTHER): Payer: Medicare Other | Admitting: Family Medicine

## 2016-09-13 ENCOUNTER — Encounter: Payer: Self-pay | Admitting: Family Medicine

## 2016-09-13 VITALS — BP 120/64 | HR 61 | Temp 97.3°F | Wt 150.6 lb

## 2016-09-13 DIAGNOSIS — G309 Alzheimer's disease, unspecified: Secondary | ICD-10-CM | POA: Diagnosis not present

## 2016-09-13 DIAGNOSIS — I2510 Atherosclerotic heart disease of native coronary artery without angina pectoris: Secondary | ICD-10-CM | POA: Diagnosis not present

## 2016-09-13 DIAGNOSIS — R451 Restlessness and agitation: Secondary | ICD-10-CM | POA: Diagnosis not present

## 2016-09-13 DIAGNOSIS — F0280 Dementia in other diseases classified elsewhere without behavioral disturbance: Secondary | ICD-10-CM | POA: Diagnosis not present

## 2016-09-13 MED ORDER — MEMANTINE HCL 10 MG PO TABS: ORAL_TABLET | ORAL | 1 refills | Status: DC

## 2016-09-13 MED ORDER — MELATONIN 1 MG PO CAPS: 1.0000 mg | ORAL_CAPSULE | Freq: Every day | ORAL | 5 refills | Status: DC

## 2016-09-13 MED ORDER — RISPERIDONE 0.25 MG PO TABS: 0.2500 mg | ORAL_TABLET | Freq: Two times a day (BID) | ORAL | 5 refills | Status: DC
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
## 2016-09-13 NOTE — Patient Instructions (Signed)
Give me some feedback in the next few weeks regarding the Risperdal

## 2016-09-14 ENCOUNTER — Telehealth: Payer: Self-pay | Admitting: Family Medicine

## 2016-09-14 NOTE — Telephone Encounter (Signed)
Pt POA michelle Wantz signed release to request medical records from 05/31/16 to present. Please call POA when ready [phone].

## 2016-09-20 NOTE — Patient Instructions (Signed)
Pre visit review using our clinic review tool, if applicable. No additional management support is needed unless otherwise documented below in the visit note. 

## 2016-09-21 ENCOUNTER — Ambulatory Visit (INDEPENDENT_AMBULATORY_CARE_PROVIDER_SITE_OTHER): Payer: Medicare Other | Admitting: General Practice

## 2016-09-21 DIAGNOSIS — I4892 Unspecified atrial flutter: Secondary | ICD-10-CM

## 2016-09-21 DIAGNOSIS — Z5181 Encounter for therapeutic drug level monitoring: Secondary | ICD-10-CM

## 2016-09-21 DIAGNOSIS — Z952 Presence of prosthetic heart valve: Secondary | ICD-10-CM

## 2016-09-23 NOTE — Patient Instructions (Signed)
Pre visit review using our clinic review tool, if applicable. No additional management support is needed unless otherwise documented below in the visit note. 

## 2016-09-26 ENCOUNTER — Ambulatory Visit (INDEPENDENT_AMBULATORY_CARE_PROVIDER_SITE_OTHER): Payer: Medicare Other | Admitting: General Practice

## 2016-09-26 DIAGNOSIS — Z5181 Encounter for therapeutic drug level monitoring: Secondary | ICD-10-CM | POA: Diagnosis not present

## 2016-09-26 DIAGNOSIS — I4892 Unspecified atrial flutter: Secondary | ICD-10-CM | POA: Diagnosis not present

## 2016-09-26 DIAGNOSIS — Z952 Presence of prosthetic heart valve: Secondary | ICD-10-CM | POA: Diagnosis not present

## 2016-09-26 LAB — POCT INR: INR: 1.8

## 2016-10-20 ENCOUNTER — Encounter: Payer: Self-pay | Admitting: Family Medicine

## 2016-10-24 ENCOUNTER — Ambulatory Visit (INDEPENDENT_AMBULATORY_CARE_PROVIDER_SITE_OTHER): Payer: Medicare Other | Admitting: General Practice

## 2016-10-24 DIAGNOSIS — Z5181 Encounter for therapeutic drug level monitoring: Secondary | ICD-10-CM

## 2016-10-24 DIAGNOSIS — Z952 Presence of prosthetic heart valve: Secondary | ICD-10-CM

## 2016-10-24 DIAGNOSIS — Z7901 Long term (current) use of anticoagulants: Secondary | ICD-10-CM | POA: Insufficient documentation

## 2016-11-01 ENCOUNTER — Other Ambulatory Visit: Payer: Self-pay | Admitting: Adult Health

## 2016-11-09 ENCOUNTER — Other Ambulatory Visit: Payer: Self-pay | Admitting: Family Medicine

## 2016-11-09 ENCOUNTER — Telehealth: Payer: Self-pay | Admitting: Family Medicine

## 2016-11-09 ENCOUNTER — Ambulatory Visit (INDEPENDENT_AMBULATORY_CARE_PROVIDER_SITE_OTHER): Payer: Medicare Other | Admitting: General Practice

## 2016-11-09 DIAGNOSIS — Z7901 Long term (current) use of anticoagulants: Secondary | ICD-10-CM | POA: Diagnosis not present

## 2016-11-09 DIAGNOSIS — Z952 Presence of prosthetic heart valve: Secondary | ICD-10-CM | POA: Diagnosis not present

## 2016-11-09 LAB — POCT INR: INR: 1.6

## 2016-11-09 NOTE — Telephone Encounter (Signed)
Anderson Malta with [facility] would like yo to fax results with dosing instructions after pt's visit today at 2:30 Thanks  [phone]

## 2016-11-09 NOTE — Telephone Encounter (Signed)
Refill OK for one year.

## 2016-11-09 NOTE — Telephone Encounter (Signed)
Thank you!  I always do!

## 2016-11-09 NOTE — Patient Instructions (Signed)
Pre visit review using our clinic review tool, if applicable. No additional management support is needed unless otherwise documented below in the visit note. 

## 2016-11-22 ENCOUNTER — Ambulatory Visit (INDEPENDENT_AMBULATORY_CARE_PROVIDER_SITE_OTHER): Payer: Medicare Other | Admitting: Family Medicine

## 2016-11-22 ENCOUNTER — Encounter: Payer: Self-pay | Admitting: Family Medicine

## 2016-11-22 VITALS — BP 112/68 | HR 69 | Temp 98.2°F | Resp 16 | Ht 70.0 in | Wt 163.1 lb

## 2016-11-22 DIAGNOSIS — I2510 Atherosclerotic heart disease of native coronary artery without angina pectoris: Secondary | ICD-10-CM

## 2016-11-22 DIAGNOSIS — Z7901 Long term (current) use of anticoagulants: Secondary | ICD-10-CM | POA: Diagnosis not present

## 2016-11-22 DIAGNOSIS — R21 Rash and other nonspecific skin eruption: Secondary | ICD-10-CM | POA: Diagnosis not present

## 2016-11-22 DIAGNOSIS — Z23 Encounter for immunization: Secondary | ICD-10-CM | POA: Diagnosis not present

## 2016-11-22 MED ORDER — TRIAMCINOLONE ACETONIDE 0.1 % EX CREA: 1.0000 "application " | TOPICAL_CREAM | Freq: Two times a day (BID) | CUTANEOUS | 0 refills | Status: AC

## 2016-11-22 MED ORDER — DOXYCYCLINE HYCLATE 100 MG PO TABS: 100.0000 mg | ORAL_TABLET | Freq: Two times a day (BID) | ORAL | 0 refills | Status: AC

## 2016-11-22 NOTE — Progress Notes (Signed)
ACUTE VISIT   HPI:  Chief Complaint  Patient presents with  . Spot on right leg    [name] is a 83 y.o. male, who is here today with his caregiver, Marvin Wagner, who provides history. He has Hx of dementia, atrial flutter,long term anticoagulation.  Caregiver noted lesion last night, no changes in size. Pruritic, not tender. Problem is constant. Unsure about exacerbating factors. No alleviating factors identified.  No new medication, detergent, soap, or body product. No known insect bite or outdoor exposures to plants. No sick contact. No Hx of eczema or similar rash in the past.  OTC medication for this problem: None   Has not noted oral lesions/edema,cough, wheezing, dyspnea, abdominal pain, nausea, or vomiting. No joint edema or erythema. She has not noted fever,appetite or activity changes, or Marvin changes.   Review of Systems  Constitutional: Negative for activity change, appetite change, fatigue and fever.  HENT: Negative for mouth sores, nosebleeds and sore throat.   Respiratory: Negative for cough, chest tightness, shortness of breath and wheezing.   Gastrointestinal: Negative for abdominal pain, nausea and vomiting.  Genitourinary: Negative for decreased urine volume and hematuria.  Musculoskeletal: Negative for joint swelling and myalgias.  Skin: Positive for rash. Negative for wound.  Neurological: Negative for syncope, weakness and headaches.  Hematological: Does not bruise/bleed easily.  Psychiatric/Behavioral:       No Marvin changes.      Current Outpatient Prescriptions on File Prior to Visit  Medication Sig Dispense Refill  . acetaminophen (TYLENOL) 500 MG tablet Take 500 mg by mouth every 6 (six) hours as needed (for pain).     Marland Kitchen atorvastatin (LIPITOR) 40 MG tablet Take 1 tablet (40 mg total) by mouth daily. 90 tablet 2  . calcium carbonate (OS-CAL) 600 MG TABS Take 600 mg by mouth 2 (two) times daily with a meal.      .  Cholecalciferol (VITAMIN D) 2000 UNITS tablet Take 2,000 Units by mouth daily.      Marland Kitchen donepezil (ARICEPT) 10 MG tablet TAKE 1 TABLET (10 MG TOTAL) BY MOUTH AT BEDTIME. 30 tablet 11  . Melatonin 1 MG CAPS Take 1 capsule (1 mg total) by mouth at bedtime. 30 capsule 5  . memantine (NAMENDA) 10 MG tablet TAKE   1 TABLET (10MG ) BY MOUTH TWICE DAILY 180 tablet 1  . metoprolol tartrate (LOPRESSOR) 25 MG tablet Take 0.5 tablets (12.5 mg total) by mouth daily. 45 tablet 1  . metoprolol tartrate (LOPRESSOR) 25 MG tablet TAKE ONE-HALF TABLETS (12.5 MG TOTAL) BY MOUTH DAILY. 45 tablet 3  . Multiple Vitamin (MULTIVITAMIN) tablet Take 1 tablet by mouth daily.      . risperiDONE (RISPERDAL) 0.25 MG tablet Take 1 tablet (0.25 mg total) by mouth 2 (two) times daily. 60 tablet 5  . vitamin C (ASCORBIC ACID) 500 MG tablet Take 500 mg by mouth daily.      Marland Kitchen warfarin (COUMADIN) 5 MG tablet TAKE AS DIRECTED BY ANTICOAGULATION CLINIC 45 tablet 4   No current facility-administered medications on file prior to visit.      Past Medical History:  Diagnosis Date  . Aortic insufficiency and aortic stenosis   . Aortic stenosis    SEVERE  . CAD (coronary artery disease)   . Heart murmur   . Hyperlipidemia   . Hypertension   . Mild memory disturbance    Allergies  Allergen Reactions  . Penicillins Other (See Comments)  Has patient had a PCN reaction causing immediate rash, facial/tongue/throat swelling, SOB or lightheadedness with hypotension: Unknown Has patient had a PCN reaction causing severe rash involving mucus membranes or skin necrosis: Unknown Has patient had a PCN reaction that required hospitalization: Unknown Has patient had a PCN reaction occurring within the last 10 years: Unknown If all of the above answers are "NO", then may proceed with Cephalosporin use.     Social History   Social History  . Marital status: Married    Spouse name: N/A  . Number of children: 2  . Years of education:  Masters   Occupational History  . Retired    Social History Main Topics  . Smoking status: Former Smoker    Quit date: 06/30/2006  . Smokeless tobacco: Never Used  . Alcohol use No  . Drug use: No  . Sexual activity: Not Asked   Other Topics Concern  . None   Social History Narrative  . None    Vitals:   11/22/16 0955  BP: 112/68  Pulse: 69  Resp: 16  Temp: 98.2 F (36.8 C)  SpO2: 98%   Body mass index is 23.41 kg/m.   Physical Exam  Nursing note and vitals reviewed. Constitutional: He appears well-developed and well-nourished. He is cooperative. No distress.  HENT:  Head: Normocephalic and atraumatic.  Mouth/Throat: Oropharynx is clear and moist and mucous membranes are normal.  Eyes: Conjunctivae are normal.  Cardiovascular: Normal rate and regular rhythm.   Murmur (? Soft SEM LUSB) heard. Pulses:      Posterior tibial pulses are 2+ on the right side.  Respiratory: Effort normal and breath sounds normal. No respiratory distress.  GI: Soft. There is no tenderness.  Musculoskeletal: He exhibits edema (Trace pitting LE edema, bilateral). He exhibits no tenderness.  Neurological: He is alert.  No focal deficit appreciated. Otherwise stable gait with no assistance device.   Skin: Skin is warm. Rash noted. Rash is macular. No erythema.     Right calf with oval erythematous lesion, 3 in x 2 in, mildly raised,and indurated. No local heat or fluctuant area. No tender. There is small ecchymotic changes,confluent, mid lower aspect of lesion.  Psychiatric: He has a normal mood and affect. Cognition and memory are impaired.  Well groomed, good eye contact.    ASSESSMENT AND PLAN:   Marvin Wagner was seen today for spot on right leg.  Diagnoses and all orders for this visit:  Rash and nonspecific skin eruption  We discussed possible etiology: ? Insect bite, empiric abx treatment recommended. Side effects discussed. For pruritus topical steroid recommended, small  amount bid x 14 days. Monitor for new lesions or worsening symptoms. Instructed about warning signs. F/U with PCP if not better in 2 weeks.  -     doxycycline (VIBRA-TABS) 100 MG tablet; Take 1 tablet (100 mg total) by mouth 2 (two) times daily. -     triamcinolone cream (KENALOG) 0.1 %; Apply 1 application topically 2 (two) times daily.  Long term (current) use of anticoagulants  Due to risk of interaction of abx with Coumadin, INR recommended in  3 days.  Instructed about warning signs.  Need for influenza vaccination -     Flu vaccine HIGH DOSE PF      -[name] and caregiver were advised to seek immediate medical attention if sudden worsening symptoms or to follow if they persist or if new concerns arise.       Betty G. Martinique, MD  [place]. [place] office.

## 2016-11-22 NOTE — Patient Instructions (Addendum)
  [name] I have seen you today for an acute visit.  A few things to remember from today's visit:   Rash and nonspecific skin eruption  Long term (current) use of anticoagulants  ? Insect bite.  Medications prescribed today are intended for short period of time and will not be refill upon request, a follow up appointment might be necessary to discuss continuation of of treatment if appropriate.  Take med with food.  Because on Coumadin, INR in 3 days.   In general please monitor for signs of worsening symptoms and seek immediate medical attention if any concerning.  If symptoms are not resolved in 2 weeks you should schedule a follow up appointment with your doctor, before if needed.  I hope you get better soon!

## 2016-11-23 ENCOUNTER — Ambulatory Visit: Payer: Medicare Other

## 2016-11-25 ENCOUNTER — Ambulatory Visit (INDEPENDENT_AMBULATORY_CARE_PROVIDER_SITE_OTHER): Payer: Medicare Other | Admitting: General Practice

## 2016-11-25 ENCOUNTER — Ambulatory Visit: Payer: Medicare Other | Admitting: General Practice

## 2016-11-25 DIAGNOSIS — Z952 Presence of prosthetic heart valve: Secondary | ICD-10-CM

## 2016-11-25 DIAGNOSIS — Z7901 Long term (current) use of anticoagulants: Secondary | ICD-10-CM | POA: Diagnosis not present

## 2016-11-25 LAB — POCT INR: INR: 1.6

## 2016-11-25 NOTE — Patient Instructions (Signed)
Pre visit review using our clinic review tool, if applicable. No additional management support is needed unless otherwise documented below in the visit note. 

## 2016-12-12 ENCOUNTER — Ambulatory Visit (INDEPENDENT_AMBULATORY_CARE_PROVIDER_SITE_OTHER): Payer: Medicare Other | Admitting: General Practice

## 2016-12-12 ENCOUNTER — Other Ambulatory Visit: Payer: Self-pay | Admitting: *Deleted

## 2016-12-12 DIAGNOSIS — Z952 Presence of prosthetic heart valve: Secondary | ICD-10-CM

## 2016-12-12 DIAGNOSIS — Z7901 Long term (current) use of anticoagulants: Secondary | ICD-10-CM | POA: Diagnosis not present

## 2016-12-12 LAB — POCT INR: INR: 2.9

## 2016-12-12 MED ORDER — ATORVASTATIN CALCIUM 40 MG PO TABS: 40.0000 mg | ORAL_TABLET | Freq: Every day | ORAL | 2 refills | Status: DC

## 2016-12-12 NOTE — Telephone Encounter (Signed)
Rx done. 

## 2016-12-12 NOTE — Patient Instructions (Addendum)
Pre visit review using our clinic review tool, if applicable. No additional management support is needed unless otherwise documented below in the visit note.  Continue to take 7.5 mg all days except 5 mg only on Wednesdays.  Fax results to Danise Mina, RN @ [facility] @ [phone].  Re-check in 4 weeks.

## 2016-12-13 ENCOUNTER — Ambulatory Visit (INDEPENDENT_AMBULATORY_CARE_PROVIDER_SITE_OTHER): Payer: Medicare Other | Admitting: Adult Health

## 2016-12-13 ENCOUNTER — Encounter: Payer: Self-pay | Admitting: Adult Health

## 2016-12-13 VITALS — BP 104/54 | Temp 97.5°F | Wt 166.0 lb

## 2016-12-13 DIAGNOSIS — J40 Bronchitis, not specified as acute or chronic: Secondary | ICD-10-CM | POA: Diagnosis not present

## 2016-12-13 DIAGNOSIS — I2510 Atherosclerotic heart disease of native coronary artery without angina pectoris: Secondary | ICD-10-CM

## 2016-12-13 MED ORDER — DOXYCYCLINE HYCLATE 100 MG PO CAPS: 100.0000 mg | ORAL_CAPSULE | Freq: Two times a day (BID) | ORAL | 0 refills | Status: DC

## 2016-12-13 MED ORDER — PREDNISONE 10 MG PO TABS: 10.0000 mg | ORAL_TABLET | Freq: Every day | ORAL | 0 refills | Status: DC

## 2016-12-13 NOTE — Progress Notes (Signed)
Subjective:    Patient ID: Marvin Wagner, male    DOB: [birth year], 83 y.o.   MRN: [id number]  URI   This is a new problem. The current episode started in the past 7 days. There has been no fever. Associated symptoms include congestion, coughing (productive ), ear pain and rhinorrhea. Pertinent negatives include no headaches, sinus pain, sore throat or wheezing. He has tried nothing for the symptoms.   He present with his caregiver who helps provide history due to dementia   Review of Systems  Constitutional: Negative.   HENT: Positive for congestion, ear pain and rhinorrhea. Negative for ear discharge, postnasal drip, sinus pressure, sinus pain, sore throat and trouble swallowing.   Respiratory: Positive for cough (productive ). Negative for shortness of breath and wheezing.   Cardiovascular: Negative.   Gastrointestinal: Negative.   Neurological: Negative for headaches.   Past Medical History:  Diagnosis Date  . Aortic insufficiency and aortic stenosis   . Aortic stenosis    SEVERE  . CAD (coronary artery disease)   . Heart murmur   . Hyperlipidemia   . Hypertension   . Mild memory disturbance     Social History   Socioeconomic History  . Marital status: Married    Spouse name: Not on file  . Number of children: 2  . Years of education: Masters  . Highest education level: Not on file  Social Needs  . Financial resource strain: Not on file  . Food insecurity - worry: Not on file  . Food insecurity - inability: Not on file  . Transportation needs - medical: Not on file  . Transportation needs - non-medical: Not on file  Occupational History  . Occupation: Retired  Tobacco Use  . Smoking status: Former Smoker    Last attempt to quit: 06/30/2006    Years since quitting: 10.4  . Smokeless tobacco: Never Used  Substance and Sexual Activity  . Alcohol use: No  . Drug use: No  . Sexual activity: Not on file  Other Topics Concern  . Not on file  Social History  Narrative  . Not on file    Past Surgical History:  Procedure Laterality Date  . AORTIC VALVE REPLACEMENT  12/17/08  . CARDIAC CATHETERIZATION    . CORONARY ANGIOPLASTY     NORMAL LEFT VENTRICULAR SIZEAND CONTRACTILITY WITH NORMAL SYSTOLIC FUNCTION. EF 65%  . TONSILLECTOMY      Family History  Problem Relation Age of Onset  . Ankylosing spondylitis Brother     Allergies  Allergen Reactions  . Penicillins Other (See Comments)    Has patient had a PCN reaction causing immediate rash, facial/tongue/throat swelling, SOB or lightheadedness with hypotension: Unknown Has patient had a PCN reaction causing severe rash involving mucus membranes or skin necrosis: Unknown Has patient had a PCN reaction that required hospitalization: Unknown Has patient had a PCN reaction occurring within the last 10 years: Unknown If all of the above answers are "NO", then may proceed with Cephalosporin use.     Current Outpatient Medications on File Prior to Visit  Medication Sig Dispense Refill  . acetaminophen (TYLENOL) 500 MG tablet Take 500 mg by mouth every 6 (six) hours as needed (for pain).     Marland Kitchen atorvastatin (LIPITOR) 40 MG tablet Take 1 tablet (40 mg total) daily by mouth. 90 tablet 2  . calcium carbonate (OS-CAL) 600 MG TABS Take 600 mg by mouth 2 (two) times daily with a meal.      .  Cholecalciferol (VITAMIN D) 2000 UNITS tablet Take 2,000 Units by mouth daily.      Marland Kitchen donepezil (ARICEPT) 10 MG tablet TAKE 1 TABLET (10 MG TOTAL) BY MOUTH AT BEDTIME. 30 tablet 11  . Melatonin 1 MG CAPS Take 1 capsule (1 mg total) by mouth at bedtime. 30 capsule 5  . memantine (NAMENDA) 10 MG tablet TAKE   1 TABLET (10MG ) BY MOUTH TWICE DAILY 180 tablet 1  . metoprolol tartrate (LOPRESSOR) 25 MG tablet Take 0.5 tablets (12.5 mg total) by mouth daily. 45 tablet 1  . metoprolol tartrate (LOPRESSOR) 25 MG tablet TAKE ONE-HALF TABLETS (12.5 MG TOTAL) BY MOUTH DAILY. 45 tablet 3  . Multiple Vitamin (MULTIVITAMIN)  tablet Take 1 tablet by mouth daily.      . risperiDONE (RISPERDAL) 0.25 MG tablet Take 1 tablet (0.25 mg total) by mouth 2 (two) times daily. 60 tablet 5  . vitamin C (ASCORBIC ACID) 500 MG tablet Take 500 mg by mouth daily.      Marland Kitchen warfarin (COUMADIN) 5 MG tablet TAKE AS DIRECTED BY ANTICOAGULATION CLINIC 45 tablet 4   No current facility-administered medications on file prior to visit.     BP (!) 104/54 (BP Location: Right Arm)   Temp (!) 97.5 F (36.4 C) (Oral)   Wt 166 lb (75.3 kg)   BMI 23.82 kg/m       Objective:   Physical Exam  Constitutional: He is oriented to person, place, and time. He appears well-developed and well-nourished. No distress.  Cardiovascular: Normal rate, regular rhythm, normal heart sounds and intact distal pulses. Exam reveals no gallop and no friction rub.  No murmur heard. Pulmonary/Chest: Effort normal. No respiratory distress. He has wheezes (expiratory wheezing throughout lung fields ). He has no rhonchi. He has no rales. He exhibits no tenderness.  Neurological: He is alert and oriented to person, place, and time.  Skin: Skin is warm and dry. No rash noted. He is not diaphoretic. No erythema. No pallor.  Psychiatric: He has a normal mood and affect. Judgment and thought content normal.  Nursing note and vitals reviewed.     Assessment & Plan:  1. Bronchitis - Exam consistent with bronchitis. Will cover for underlying pneumonia due to age - doxycycline (VIBRAMYCIN) 100 MG capsule; Take 1 capsule (100 mg total) 2 (two) times daily by mouth.  Dispense: 14 capsule; Refill: 0 - predniSONE (DELTASONE) 10 MG tablet; Take 1 tablet (10 mg total) daily with breakfast by mouth.  Dispense: 7 tablet; Refill: 0 - Follow up in 2-3 days if no improvement or sooner if needed   Dorothyann Peng, NP

## 2016-12-13 NOTE — Patient Instructions (Signed)
It was great meeting you today   I am going to treat you for bronchitis.   I have sent in a prescription for Doxycycline ( antibiotic) and prednisone ( steroid)   Please follow up in 2-3 days if not improved

## 2016-12-26 ENCOUNTER — Emergency Department (HOSPITAL_COMMUNITY): Payer: Medicare Other

## 2016-12-26 ENCOUNTER — Emergency Department (HOSPITAL_COMMUNITY)
Admission: EM | Admit: 2016-12-26 | Discharge: 2016-12-26 | Disposition: A | Discharge status: Home / Self Care | Payer: Medicare Other | Attending: Physician Assistant | Admitting: Physician Assistant

## 2016-12-26 ENCOUNTER — Encounter (HOSPITAL_COMMUNITY): Payer: Self-pay | Admitting: Emergency Medicine

## 2016-12-26 DIAGNOSIS — R2681 Unsteadiness on feet: Secondary | ICD-10-CM | POA: Diagnosis not present

## 2016-12-26 DIAGNOSIS — J40 Bronchitis, not specified as acute or chronic: Secondary | ICD-10-CM | POA: Diagnosis not present

## 2016-12-26 DIAGNOSIS — Z87891 Personal history of nicotine dependence: Secondary | ICD-10-CM | POA: Insufficient documentation

## 2016-12-26 DIAGNOSIS — Z951 Presence of aortocoronary bypass graft: Secondary | ICD-10-CM | POA: Insufficient documentation

## 2016-12-26 DIAGNOSIS — R4020 Unspecified coma: Secondary | ICD-10-CM | POA: Diagnosis not present

## 2016-12-26 DIAGNOSIS — Z952 Presence of prosthetic heart valve: Secondary | ICD-10-CM | POA: Diagnosis not present

## 2016-12-26 DIAGNOSIS — R269 Unspecified abnormalities of gait and mobility: Secondary | ICD-10-CM | POA: Diagnosis not present

## 2016-12-26 DIAGNOSIS — R9431 Abnormal electrocardiogram [ECG] [EKG]: Secondary | ICD-10-CM | POA: Diagnosis not present

## 2016-12-26 DIAGNOSIS — I2510 Atherosclerotic heart disease of native coronary artery without angina pectoris: Secondary | ICD-10-CM | POA: Insufficient documentation

## 2016-12-26 DIAGNOSIS — R4182 Altered mental status, unspecified: Secondary | ICD-10-CM | POA: Diagnosis not present

## 2016-12-26 DIAGNOSIS — I10 Essential (primary) hypertension: Secondary | ICD-10-CM | POA: Diagnosis not present

## 2016-12-26 DIAGNOSIS — I6789 Other cerebrovascular disease: Secondary | ICD-10-CM | POA: Diagnosis not present

## 2016-12-26 DIAGNOSIS — R2689 Other abnormalities of gait and mobility: Secondary | ICD-10-CM | POA: Diagnosis not present

## 2016-12-26 LAB — CBC WITH DIFFERENTIAL/PLATELET
BASOS ABS: 0 10*3/uL (ref 0.0–0.1)
Basophils Relative: 0 %
Eosinophils Absolute: 0.2 10*3/uL (ref 0.0–0.7)
Eosinophils Relative: 3 %
HEMATOCRIT: 38.5 % — AB (ref 39.0–52.0)
HEMOGLOBIN: 13.2 g/dL (ref 13.0–17.0)
LYMPHS PCT: 13 %
Lymphs Abs: 1.2 10*3/uL (ref 0.7–4.0)
MCH: 33.1 pg (ref 26.0–34.0)
MCHC: 34.3 g/dL (ref 30.0–36.0)
MCV: 96.5 fL (ref 78.0–100.0)
MONO ABS: 0.8 10*3/uL (ref 0.1–1.0)
Monocytes Relative: 9 %
NEUTROS ABS: 7 10*3/uL (ref 1.7–7.7)
Neutrophils Relative %: 75 %
Platelets: 139 10*3/uL — ABNORMAL LOW (ref 150–400)
RBC: 3.99 MIL/uL — ABNORMAL LOW (ref 4.22–5.81)
RDW: 13.2 % (ref 11.5–15.5)
WBC: 9.3 10*3/uL (ref 4.0–10.5)

## 2016-12-26 LAB — I-STAT TROPONIN, ED: TROPONIN I, POC: 0 ng/mL (ref 0.00–0.08)

## 2016-12-26 LAB — COMPREHENSIVE METABOLIC PANEL
ALK PHOS: 67 U/L (ref 38–126)
ALT: 32 U/L (ref 17–63)
AST: 34 U/L (ref 15–41)
Albumin: 3.7 g/dL (ref 3.5–5.0)
Anion gap: 9 (ref 5–15)
BILIRUBIN TOTAL: 0.4 mg/dL (ref 0.3–1.2)
BUN: 27 mg/dL — AB (ref 6–20)
CO2: 25 mmol/L (ref 22–32)
Calcium: 9 mg/dL (ref 8.9–10.3)
Chloride: 104 mmol/L (ref 101–111)
Creatinine, Ser: 1.1 mg/dL (ref 0.61–1.24)
GFR calc Af Amer: >60 mL/min (ref >60)
GFR, EST NON AFRICAN AMERICAN: 60 mL/min — AB (ref >60)
GLUCOSE: 115 mg/dL — AB (ref 65–99)
POTASSIUM: 3.9 mmol/L (ref 3.5–5.1)
Sodium: 138 mmol/L (ref 135–145)
TOTAL PROTEIN: 6.8 g/dL (ref 6.5–8.1)

## 2016-12-26 LAB — URINALYSIS, ROUTINE W REFLEX MICROSCOPIC
BILIRUBIN URINE: NEGATIVE
Glucose, UA: NEGATIVE mg/dL
HGB URINE DIPSTICK: NEGATIVE
KETONES UR: 5 mg/dL — AB
Nitrite: NEGATIVE
Protein, ur: NEGATIVE mg/dL
SPECIFIC GRAVITY, URINE: 1.026 (ref 1.005–1.030)
SQUAMOUS EPITHELIAL / LPF: NONE SEEN
pH: 5 (ref 5.0–8.0)

## 2016-12-26 LAB — I-STAT CG4 LACTIC ACID, ED: LACTIC ACID, VENOUS: 1.23 mmol/L (ref 0.5–1.9)

## 2016-12-26 LAB — PROTIME-INR
INR: 2.33
Prothrombin Time: 25.3 seconds — ABNORMAL HIGH (ref 11.4–15.2)

## 2016-12-26 MED ORDER — LORAZEPAM 1 MG PO TABS
0.5000 mg | ORAL_TABLET | Freq: Once | ORAL | Status: AC
  Administered 2016-12-26: 0.5 mg via ORAL
  Filled 2016-12-26: qty 1

## 2016-12-26 MED ORDER — SODIUM CHLORIDE 0.9 % IV BOLUS (SEPSIS)
500.0000 mL | Freq: Once | INTRAVENOUS | Status: AC
  Administered 2016-12-26: 500 mL via INTRAVENOUS
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
## 2016-12-26 NOTE — ED Notes (Signed)
Nurse starting IV and drawing labs. 

## 2016-12-26 NOTE — ED Notes (Signed)
Patient returned from MRI.

## 2016-12-26 NOTE — ED Notes (Signed)
Patient transported to MRI 

## 2016-12-26 NOTE — ED Notes (Signed)
Ambulated pt in hallway, pt is two assist, tolerated well. Pt tended to lean back more while standing. Nurse notified, and MD notified.

## 2016-12-26 NOTE — ED Notes (Signed)
Pt stable, states understanding of discharge instructions, caregiver at bedside

## 2016-12-26 NOTE — Discharge Instructions (Signed)
We are concerned there could be a parkinsonian aspect to your dementia.  Please follow-up with neurology for further evaluation.

## 2016-12-26 NOTE — ED Provider Notes (Signed)
[facility] EMERGENCY DEPARTMENT Provider Note   CSN: [id number] Arrival date & time: 12/26/16  0442     History   Chief Complaint Chief Complaint  Patient presents with  . Gait Problem    HPI TREVION HOBEN is a 83 y.o. male.  Patient sent to the emergency department from assisted living.  Nursing staff reportedly checked on him and found him to be altered.  It is unclear if she will [place] or he was already awake, but nurse reports that he was staring off and not responding.  EMS reports that by the time they got to him he was awake and responding, but seemed to be very shaky.  They got him up and walked him, reported that he had a very slow, unsteady and shuffling gait.  This is reportedly unusual for him.  At arrival to the ER, patient is without complaints.  He reportedly was treated for bronchitis 2 weeks ago, but has not had any cough or shortness of breath.  Denies nausea, vomiting, diarrhea.      Past Medical History:  Diagnosis Date  . Aortic insufficiency and aortic stenosis   . Aortic stenosis    SEVERE  . CAD (coronary artery disease)   . Heart murmur   . Hyperlipidemia   . Hypertension   . Mild memory disturbance     Patient Active Problem List   Diagnosis Date Noted  . Long term (current) use of anticoagulants 10/24/2016  . Acute blood loss anemia 02/27/2016  . UTI (urinary tract infection) 02/27/2016  . Pressure injury of skin 02/25/2016  . Hematoma of chest wall, left, initial encounter 02/24/2016  . Fever 02/24/2016  . Supratherapeutic INR 02/24/2016  . Dementia 03/04/2015  . Hypotension 12/03/2014  . Weight loss, unintentional 10/17/2013  . History of elevated PSA 04/25/2013  . Encounter for therapeutic drug monitoring 02/26/2013  . Long term current use of anticoagulant therapy 12/19/2012  . Atrial flutter ([place]) 12/14/2012  . S/P AVR 06/13/2012  . Other vitamin B12 deficiency anemia 02/29/2012  . Memory disorder 02/14/2012  .  Hx of CABG 07/01/2010  . Hyperlipidemia 07/01/2010  . Benign hypertensive heart disease without heart failure 07/01/2010    Past Surgical History:  Procedure Laterality Date  . AORTIC VALVE REPLACEMENT  12/17/08  . CARDIAC CATHETERIZATION    . CORONARY ANGIOPLASTY     NORMAL LEFT VENTRICULAR SIZEAND CONTRACTILITY WITH NORMAL SYSTOLIC FUNCTION. EF 65%  . TONSILLECTOMY         Home Medications    Prior to Admission medications   Medication Sig Start Date End Date Taking? Authorizing Provider  acetaminophen (TYLENOL) 500 MG tablet Take 500 mg by mouth every 6 (six) hours as needed (for pain).    Yes [provider]  atorvastatin (LIPITOR) 40 MG tablet Take 1 tablet (40 mg total) daily by mouth. 12/12/16  Yes Burchette, Alinda Sierras, MD  calcium carbonate (OS-CAL) 600 MG TABS Take 600 mg by mouth 2 (two) times daily with a meal.     Yes [provider]  Cholecalciferol (VITAMIN D) 2000 UNITS tablet Take 2,000 Units by mouth daily.     Yes [provider]  donepezil (ARICEPT) 10 MG tablet TAKE 1 TABLET (10 MG TOTAL) BY MOUTH AT BEDTIME. 12/28/15  Yes Burchette, Alinda Sierras, MD  Melatonin 1 MG CAPS Take 1 capsule (1 mg total) by mouth at bedtime. 09/13/16  Yes Burchette, Alinda Sierras, MD  memantine (NAMENDA) 10 MG tablet TAKE  1 TABLET (10MG ) BY MOUTH TWICE DAILY Patient taking differently: Take 10 mg by mouth 2 (two) times daily. TAKE   1 TABLET (10MG ) BY MOUTH TWICE DAILY 09/13/16  Yes Burchette, Alinda Sierras, MD  metoprolol tartrate (LOPRESSOR) 25 MG tablet Take 0.5 tablets (12.5 mg total) by mouth daily. 11/02/15  Yes Croitoru, Mihai, MD  Multiple Vitamin (MULTIVITAMIN) tablet Take 1 tablet by mouth daily.     Yes [provider]  risperiDONE (RISPERDAL) 0.25 MG tablet Take 1 tablet (0.25 mg total) by mouth 2 (two) times daily. 09/13/16  Yes Burchette, Alinda Sierras, MD  vitamin C (ASCORBIC ACID) 500 MG tablet Take 500 mg by mouth daily.     Yes [provider]    warfarin (COUMADIN) 5 MG tablet TAKE AS DIRECTED BY ANTICOAGULATION CLINIC Patient taking differently: Take 7.5 mg on all day except Wednesday take 5 mg 08/26/16  Yes Burchette, Alinda Sierras, MD  doxycycline (VIBRAMYCIN) 100 MG capsule Take 1 capsule (100 mg total) 2 (two) times daily by mouth. Patient not taking: Reported on 12/26/2016 12/13/16   Dorothyann Peng, NP  metoprolol tartrate (LOPRESSOR) 25 MG tablet TAKE ONE-HALF TABLETS (12.5 MG TOTAL) BY MOUTH DAILY. Patient not taking: Reported on 12/26/2016 11/11/16   Eulas Post, MD  predniSONE (DELTASONE) 10 MG tablet Take 1 tablet (10 mg total) daily with breakfast by mouth. Patient not taking: Reported on 12/26/2016 12/13/16   Dorothyann Peng, NP    Family History Family History  Problem Relation Age of Onset  . Ankylosing spondylitis Brother     Social History Social History   Tobacco Use  . Smoking status: Former Smoker    Last attempt to quit: 06/30/2006    Years since quitting: 10.4  . Smokeless tobacco: Never Used  Substance Use Topics  . Alcohol use: No  . Drug use: No     Allergies   Penicillins   Review of Systems Review of Systems  Respiratory: Negative for shortness of breath.   Cardiovascular: Negative for chest pain.  Gastrointestinal: Negative for abdominal pain.  All other systems reviewed and are negative.    Physical Exam Updated Vital Signs BP 123/64 (BP Location: Left Arm)   Pulse (!) 58   Temp 97.6 F (36.4 C) (Oral)   Resp 14   SpO2 95%   Physical Exam  Constitutional: He appears well-developed and well-nourished. No distress.  HENT:  Head: Normocephalic and atraumatic.  Right Ear: Hearing normal.  Left Ear: Hearing normal.  Nose: Nose normal.  Mouth/Throat: Oropharynx is clear and moist and mucous membranes are normal.  Eyes: Conjunctivae and EOM are normal. Pupils are equal, round, and reactive to light.  Neck: Normal range of motion. Neck supple.  Cardiovascular: Regular rhythm,  S1 normal and S2 normal. Exam reveals no gallop and no friction rub.  No murmur heard. Pulmonary/Chest: Effort normal and breath sounds normal. No respiratory distress. He exhibits no tenderness.  Abdominal: Soft. Normal appearance and bowel sounds are normal. There is no hepatosplenomegaly. There is no tenderness. There is no rebound, no guarding, no tenderness at McBurney's point and negative Murphy's sign. No hernia.  Musculoskeletal: Normal range of motion.  Neurological: He is alert. He has normal strength. He is disoriented (to time). No cranial nerve deficit or sensory deficit. Coordination normal. GCS eye subscore is 4. GCS verbal subscore is 4. GCS motor subscore is 6.  Extraocular muscle movement: normal No visual field cut Pupils: equal and reactive both direct and consensual response is  normal No nystagmus present    Sensory function is intact to light touch, pinprick Proprioception intact  Grip strength 5/5 symmetric in upper extremities No pronator drift Normal finger to nose bilaterally  Lower extremity strength 4/5 against gravity Gait - slightly shuffling, but able to walk entire length of hallway with slight assistance  Skin: Skin is warm, dry and intact. No rash (Nonraised, nonpalpable, nontender erythematous patches around the mouth and on malar aspect of cheeks) noted. No cyanosis.  Psychiatric: He has a normal mood and affect. His speech is normal and behavior is normal. Thought content normal.  Nursing note and vitals reviewed.    ED Treatments / Results  Labs (all labs ordered are listed, but only abnormal results are displayed) Labs Reviewed  CBC WITH DIFFERENTIAL/PLATELET - Abnormal; Notable for the following components:      Result Value   RBC 3.99 (*)    HCT 38.5 (*)    Platelets 139 (*)    All other components within normal limits  COMPREHENSIVE METABOLIC PANEL - Abnormal; Notable for the following components:   Glucose, Bld 115 (*)    BUN 27 (*)     GFR calc non Af Amer 60 (*)    All other components within normal limits  URINALYSIS, ROUTINE W REFLEX MICROSCOPIC - Abnormal; Notable for the following components:   Ketones, ur 5 (*)    Leukocytes, UA SMALL (*)    Bacteria, UA RARE (*)    All other components within normal limits  PROTIME-INR - Abnormal; Notable for the following components:   Prothrombin Time 25.3 (*)    All other components within normal limits  URINE CULTURE  I-STAT CG4 LACTIC ACID, ED  I-STAT TROPONIN, ED    EKG  EKG Interpretation  Date/Time:  Monday December 26 2016 04:56:20 EST Ventricular Rate:  84 PR Interval:    QRS Duration: 115 QT Interval:  404 QTC Calculation: 478 R Axis:   -57 Text Interpretation:  Sinus rhythm Multiple premature complexes, vent & supraven Incomplete left bundle branch block When compared with ECG of 02/24/2016, Premature ventricular complexes are now present Confirmed by Delora Fuel ([id number]) on 12/26/2016 5:24:06 AM       Radiology Dg Chest 2 View  Result Date: 12/26/2016 CLINICAL DATA:  Altered mental status.  Recent bronchitis treatment. EXAM: CHEST  2 VIEW COMPARISON:  02/07/2016 FINDINGS: Postoperative changes in the mediastinum. Heart size and pulmonary vascularity are normal. Lungs are clear and expanded. No blunting of costophrenic angles. No pneumothorax. Calcified aorta. Emphysematous changes in the lungs. IMPRESSION: Emphysematous changes in the lungs. No evidence of active pulmonary disease. Aortic atherosclerosis. Electronically Signed   By: Lucienne Capers M.D.   On: 12/26/2016 05:37   Ct Head Wo Contrast  Result Date: 12/26/2016 CLINICAL DATA:  Altered mental status EXAM: CT HEAD WITHOUT CONTRAST TECHNIQUE: Contiguous axial images were obtained from the base of the skull through the vertex without intravenous contrast. COMPARISON:  Head CT 02/24/2016 FINDINGS: Brain: No mass lesion, intraparenchymal hemorrhage or extra-axial collection. No evidence of acute  cortical infarct. There is periventricular hypoattenuation compatible with chronic microvascular disease. Diffuse moderate atrophy. Vascular: No hyperdense vessel or unexpected calcification. Skull: Normal visualized skull base, calvarium and extracranial soft tissues. Sinuses/Orbits: No sinus fluid levels or advanced mucosal thickening. No mastoid effusion. Normal orbits. IMPRESSION: Moderate atrophy and sequelae of chronic ischemic microangiopathy without acute abnormality. Electronically Signed   By: Ulyses Jarred M.D.   On: 12/26/2016 05:25    Procedures  Procedures (including critical care time)  Medications Ordered in ED Medications - No data to display   Initial Impression / Assessment and Plan / ED Course  I have reviewed the triage vital signs and the nursing notes.  Pertinent labs & imaging results that were available during my care of the patient were reviewed by me and considered in my medical decision making (see chart for details).     Patient presents for evaluation of mental status changes.  Patient resides in assisted living, as well as nurse checked on him and reported that he was altered.  Initially he seemed to be unable to recognize her and that he appeared like he was "frozen".  He reports that he was staring and not responding, not moving well, seemed slightly rigid.  EMS reports that he was trembling and shaking upon their arrival, but was awake, alert.  They tried to walk him to the ambulance, however, and he had some gait difficulty with short shuffling steps which is unusual for him.  Upon arrival to the ER he is awake and alert without complaints.  He comprehends well and follows commands without difficulty, but he is disoriented to time.  Discussion with his nurse, however, reveals that he does have a history of mild dementia.  It is not clear if this disorientation to time would be appropriate for him.  We did try to ambulate him and he did have a short shuffling gait,  but his wellness nurse thought it was closer to his normal gait than what was seen earlier.  Patient's daughter is here in the ER now.  She reports that he had a similar episode within the last year that was related to a urinary tract infection.  His urinalysis today, however, is unremarkable.  Remainder of his workup including CT head was unremarkable.  He did recently have a bronchitis, chest x-ray does not show pneumonia, he has no hypoxia or respiratory difficulty.  All of his vital signs are normal including no evidence of a fever.  Patient also has developed a slight rash.  He has some reddish discoloration around his mouth and on his cheeks that was not there yesterday.  He is unaware of this, it does not hurt or itch.  He is on Coumadin, has a history of atrial flutter as well as aortic valve replaced.  INR is therapeutic, rash does not look consistent with purpura or petechiae.  Discussed with Dr. Rory Percy, on-call for neurology.  Agrees with performing MRI of brain.  He does not think that the patient would require an MRI of any of his spine, as his symptoms are symmetric and unlikely to be an origin of the cervical spine or lumbar spine.  MRI will be performed of brain.  Case will be signed out to oncoming ER physician to follow-up on MRI and recheck patient for improvement.  Final Clinical Impressions(s) / ED Diagnoses   Final diagnoses:  Altered mental status, unspecified altered mental status type    ED Discharge Orders    None       Leverett Camplin, Gwenyth Allegra, MD 12/26/16 [phone]

## 2016-12-26 NOTE — ED Notes (Signed)
Urinal placed for pt to use.

## 2016-12-26 NOTE — ED Provider Notes (Signed)
Took range of care from Dr. Betsey Holiday.  Plan to follow-up MRI.  MRI is negative.  We ambulated patient, he has a short shuffling gait parkinsonian in nature.  I do think the patient's dementia could have a parkinsonian component, will given referral to follow-up with neurology.  It is possible that there is some medication changes they can make that will help his parkinsonian component of the dementia. Discussed with duaghter and caregiver.   Macarthur Critchley, MD 12/26/16 1049

## 2016-12-27 ENCOUNTER — Ambulatory Visit (INDEPENDENT_AMBULATORY_CARE_PROVIDER_SITE_OTHER): Payer: Medicare Other | Admitting: Family Medicine

## 2016-12-27 ENCOUNTER — Encounter: Payer: Self-pay | Admitting: Family Medicine

## 2016-12-27 VITALS — BP 110/74 | HR 74 | Temp 97.7°F | Wt 165.0 lb

## 2016-12-27 DIAGNOSIS — F0280 Dementia in other diseases classified elsewhere without behavioral disturbance: Secondary | ICD-10-CM

## 2016-12-27 DIAGNOSIS — R2689 Other abnormalities of gait and mobility: Secondary | ICD-10-CM | POA: Diagnosis not present

## 2016-12-27 DIAGNOSIS — L710 Perioral dermatitis: Secondary | ICD-10-CM

## 2016-12-27 DIAGNOSIS — L853 Xerosis cutis: Secondary | ICD-10-CM | POA: Diagnosis not present

## 2016-12-27 DIAGNOSIS — G309 Alzheimer's disease, unspecified: Secondary | ICD-10-CM | POA: Diagnosis not present

## 2016-12-27 DIAGNOSIS — I2510 Atherosclerotic heart disease of native coronary artery without angina pectoris: Secondary | ICD-10-CM | POA: Diagnosis not present

## 2016-12-27 LAB — URINE CULTURE: CULTURE: NO GROWTH

## 2016-12-27 MED ORDER — TRIAMCINOLONE ACETONIDE 0.1 % EX CREA: 1.0000 "application " | TOPICAL_CREAM | Freq: Two times a day (BID) | CUTANEOUS | 3 refills | Status: DC | PRN

## 2016-12-27 NOTE — Progress Notes (Signed)
Subjective:     Patient ID: Marvin Wagner, male   DOB: [birth year], 83 y.o.   MRN: [id number]  HPI  Patient has advanced dementia along with multiple other medical problems and is seen following ER evaluation. He presented there 2 days ago from assisted living facility. Nursing staff had noted that he seemed to have some altered mental status. He seemed to be "staring off" and not responding as usual.   Per EMS reports apparently when they got there he was awake and responding but seemed very "shaky ". They got him up and walked him and reportedly had very slow unsteady shuffling type gait. The patient was without complaints at time of ER evaluation. He was seen recently here for acute bronchitis and was treated with low-dose prednisone for 7 days and doxycycline. No reported fever. No nausea or vomiting. No diarrhea. No complaints of pain. No cough.  He's been walking with walker since then. He had extensive evaluation in ER with multiple labs including CBC, comprehensive chemistries, troponin levels (which were normal). Urine culture was negative. CT head no acute findings. MRI brain no acute findings.  ER staff had concerns about possible Parkinson's because of his shuffling gait.  Other issue is scaly rash perioral region for the past several days. He had been given some Shrimp lo-mein Sunday and staff was concerned he may be having some sort of food allergy. He also frequently scratches his lower legs bilaterally  Has apparently had a couple of falls recently from balance issues.  No hx of seizure.  Past Medical History:  Diagnosis Date  . Aortic insufficiency and aortic stenosis   . Aortic stenosis    SEVERE  . CAD (coronary artery disease)   . Heart murmur   . Hyperlipidemia   . Hypertension   . Mild memory disturbance    Past Surgical History:  Procedure Laterality Date  . AORTIC VALVE REPLACEMENT  12/17/08  . CARDIAC CATHETERIZATION    . CORONARY ANGIOPLASTY     NORMAL LEFT  VENTRICULAR SIZEAND CONTRACTILITY WITH NORMAL SYSTOLIC FUNCTION. EF 65%  . TONSILLECTOMY      reports that he quit smoking about 10 years ago. he has never used smokeless tobacco. He reports that he does not drink alcohol or use drugs. family history includes Ankylosing spondylitis in his brother. Allergies  Allergen Reactions  . Penicillins Other (See Comments)    Has patient had a PCN reaction causing immediate rash, facial/tongue/throat swelling, SOB or lightheadedness with hypotension: Unknown Has patient had a PCN reaction causing severe rash involving mucus membranes or skin necrosis: Unknown Has patient had a PCN reaction that required hospitalization: Unknown Has patient had a PCN reaction occurring within the last 10 years: Unknown If all of the above answers are "NO", then may proceed with Cephalosporin use.     Review of Systems Review of systems Limited from patient secondary to advanced dementia    Objective:   Physical Exam  Constitutional: He appears well-developed and well-nourished.  Patient is alert and cooperative and in no distress  Neck: Neck supple. No thyromegaly present.  Cardiovascular: Normal rate.  Pulmonary/Chest: Effort normal and breath sounds normal. No respiratory distress. He has no wheezes. He has no rales.  Musculoskeletal: He exhibits no edema.  Neurological: He is alert. No cranial nerve deficit.  No focal weakness. Ambulating with walker without much difficulty  Skin: Rash noted.  Prominent rash around the peri-oral region which has erythematous base and scaly surface. No pustules  Assessment:     #1 advanced dementia with recent reported issues of increased imbalance requiring ambulation with walker which is not his baseline.   No apparent lower extremity weakness.  #2 question of recent transient altered mental status with unremarkable lab work or CT or MRI brain.  He has advanced dementia at baseline.  #3. Peri-oral  dermatitis  #4 dry skin dermatitis especially involving legs    Plan:     -Try over-the-counter moisturizer such as Cetaphil to legs twice daily -Triamcinolone 0.1% cream to facial rash twice daily and touch base in one week if not improving -Consider neurology referral for further evaluation regarding his balance issues. -recommend he ambulate with walker at all times.  Eulas Post MD [facility]

## 2016-12-27 NOTE — Patient Instructions (Signed)
Try OTC moisturizer such as Cetaphil or Eucerin and apply to areas of dry skin two times daily  Use the Triamcinolone to face twice daily and be in touch if not better in one week.  We will set up neurology evaluation to check his balance issues.

## 2016-12-28 ENCOUNTER — Ambulatory Visit: Payer: Medicare Other | Admitting: Family Medicine

## 2017-01-05 ENCOUNTER — Encounter: Payer: Self-pay | Admitting: Neurology

## 2017-01-09 ENCOUNTER — Ambulatory Visit: Payer: Medicare Other

## 2017-01-14 ENCOUNTER — Emergency Department (HOSPITAL_COMMUNITY): Payer: Medicare Other

## 2017-01-14 ENCOUNTER — Other Ambulatory Visit: Payer: Self-pay

## 2017-01-14 ENCOUNTER — Emergency Department (HOSPITAL_COMMUNITY)
Admission: EM | Admit: 2017-01-14 | Discharge: 2017-01-14 | Disposition: A | Discharge status: Home / Self Care | Payer: Medicare Other | Attending: Emergency Medicine | Admitting: Emergency Medicine

## 2017-01-14 ENCOUNTER — Encounter (HOSPITAL_COMMUNITY): Payer: Self-pay

## 2017-01-14 DIAGNOSIS — I2510 Atherosclerotic heart disease of native coronary artery without angina pectoris: Secondary | ICD-10-CM | POA: Diagnosis not present

## 2017-01-14 DIAGNOSIS — I10 Essential (primary) hypertension: Secondary | ICD-10-CM | POA: Diagnosis not present

## 2017-01-14 DIAGNOSIS — G309 Alzheimer's disease, unspecified: Secondary | ICD-10-CM | POA: Diagnosis not present

## 2017-01-14 DIAGNOSIS — Z87891 Personal history of nicotine dependence: Secondary | ICD-10-CM | POA: Insufficient documentation

## 2017-01-14 DIAGNOSIS — Z79899 Other long term (current) drug therapy: Secondary | ICD-10-CM | POA: Diagnosis not present

## 2017-01-14 DIAGNOSIS — Z951 Presence of aortocoronary bypass graft: Secondary | ICD-10-CM | POA: Insufficient documentation

## 2017-01-14 DIAGNOSIS — R531 Weakness: Secondary | ICD-10-CM | POA: Insufficient documentation

## 2017-01-14 DIAGNOSIS — F0280 Dementia in other diseases classified elsewhere without behavioral disturbance: Secondary | ICD-10-CM

## 2017-01-14 DIAGNOSIS — I6789 Other cerebrovascular disease: Secondary | ICD-10-CM | POA: Diagnosis not present

## 2017-01-14 DIAGNOSIS — F0390 Unspecified dementia without behavioral disturbance: Secondary | ICD-10-CM | POA: Insufficient documentation

## 2017-01-14 DIAGNOSIS — R296 Repeated falls: Secondary | ICD-10-CM | POA: Diagnosis not present

## 2017-01-14 DIAGNOSIS — Z7901 Long term (current) use of anticoagulants: Secondary | ICD-10-CM | POA: Diagnosis not present

## 2017-01-14 DIAGNOSIS — W19XXXA Unspecified fall, initial encounter: Secondary | ICD-10-CM

## 2017-01-14 DIAGNOSIS — Z952 Presence of prosthetic heart valve: Secondary | ICD-10-CM | POA: Diagnosis not present

## 2017-01-14 DIAGNOSIS — R200 Anesthesia of skin: Secondary | ICD-10-CM | POA: Diagnosis not present

## 2017-01-14 LAB — CBC WITH DIFFERENTIAL/PLATELET
Basophils Absolute: 0 10*3/uL (ref 0.0–0.1)
Basophils Relative: 1 %
EOS ABS: 0.2 10*3/uL (ref 0.0–0.7)
Eosinophils Relative: 3 %
HEMATOCRIT: 34.9 % — AB (ref 39.0–52.0)
HEMOGLOBIN: 11.8 g/dL — AB (ref 13.0–17.0)
LYMPHS ABS: 1.7 10*3/uL (ref 0.7–4.0)
LYMPHS PCT: 23 %
MCH: 33.2 pg (ref 26.0–34.0)
MCHC: 33.8 g/dL (ref 30.0–36.0)
MCV: 98.3 fL (ref 78.0–100.0)
MONOS PCT: 9 %
Monocytes Absolute: 0.7 10*3/uL (ref 0.1–1.0)
NEUTROS PCT: 64 %
Neutro Abs: 4.7 10*3/uL (ref 1.7–7.7)
Platelets: 124 10*3/uL — ABNORMAL LOW (ref 150–400)
RBC: 3.55 MIL/uL — ABNORMAL LOW (ref 4.22–5.81)
RDW: 13.5 % (ref 11.5–15.5)
WBC: 7.4 10*3/uL (ref 4.0–10.5)

## 2017-01-14 LAB — COMPREHENSIVE METABOLIC PANEL
ALK PHOS: 60 U/L (ref 38–126)
ALT: 29 U/L (ref 17–63)
ANION GAP: 6 (ref 5–15)
AST: 29 U/L (ref 15–41)
Albumin: 3.5 g/dL (ref 3.5–5.0)
BILIRUBIN TOTAL: 0.8 mg/dL (ref 0.3–1.2)
BUN: 22 mg/dL — ABNORMAL HIGH (ref 6–20)
CALCIUM: 8.9 mg/dL (ref 8.9–10.3)
CO2: 29 mmol/L (ref 22–32)
CREATININE: 0.91 mg/dL (ref 0.61–1.24)
Chloride: 105 mmol/L (ref 101–111)
Glucose, Bld: 91 mg/dL (ref 65–99)
Potassium: 3.9 mmol/L (ref 3.5–5.1)
Sodium: 140 mmol/L (ref 135–145)
TOTAL PROTEIN: 6.3 g/dL — AB (ref 6.5–8.1)

## 2017-01-14 LAB — PROTIME-INR
INR: 1.96
Prothrombin Time: 22.2 seconds — ABNORMAL HIGH (ref 11.4–15.2)

## 2017-01-14 LAB — URINALYSIS, ROUTINE W REFLEX MICROSCOPIC
BILIRUBIN URINE: NEGATIVE
Bacteria, UA: NONE SEEN
Glucose, UA: NEGATIVE mg/dL
Hgb urine dipstick: NEGATIVE
KETONES UR: NEGATIVE mg/dL
NITRITE: NEGATIVE
PH: 7 (ref 5.0–8.0)
Protein, ur: NEGATIVE mg/dL
Specific Gravity, Urine: 1.01 (ref 1.005–1.030)
Squamous Epithelial / LPF: NONE SEEN

## 2017-01-14 LAB — I-STAT TROPONIN, ED: Troponin i, poc: 0.01 ng/mL (ref 0.00–0.08)

## 2017-01-14 LAB — BRAIN NATRIURETIC PEPTIDE: B NATRIURETIC PEPTIDE 5: 175 pg/mL — AB (ref 0.0–100.0)

## 2017-01-14 LAB — MAGNESIUM: MAGNESIUM: 2.1 mg/dL (ref 1.7–2.4)

## 2017-01-14 LAB — I-STAT CG4 LACTIC ACID, ED: Lactic Acid, Venous: 1.11 mmol/L (ref 0.5–1.9)

## 2017-01-14 LAB — LIPASE, BLOOD: Lipase: 22 U/L (ref 11–51)

## 2017-01-14 NOTE — ED Notes (Signed)
I have called his contact "Jenny Reichmann", who tells me pt's. Daughter is on her way to get him.

## 2017-01-14 NOTE — ED Provider Notes (Signed)
[facility] DEPT Provider Note   CSN: [id number] Arrival date & time: 01/14/17  3559     History   Chief Complaint Chief Complaint  Patient presents with  . Weakness    HPI Marvin Wagner is a 83 y.o. male.  HPI   83 year old male with a history of coronary artery disease, hypertension, hyperlipidemia, aortic stenosis, dementia, atrial flutter on Coumadin, presents with concern for generalized weakness and fall.    Level V caveat, dementia  Patient sent from facility per EMS with concern for weakness upon standing, more falls recently.  Pt reports this AM his "legs went out from under him" to the facility.  On my history he denies any symptoms. Reports he feels fine. Denies chest pain, dyspnea, cough, urinary symptoms, numbness or weakness.   Past Medical History:  Diagnosis Date  . Aortic insufficiency and aortic stenosis   . Aortic stenosis    SEVERE  . CAD (coronary artery disease)   . Heart murmur   . Hyperlipidemia   . Hypertension   . Mild memory disturbance     Patient Active Problem List   Diagnosis Date Noted  . Long term (current) use of anticoagulants 10/24/2016  . Acute blood loss anemia 02/27/2016  . UTI (urinary tract infection) 02/27/2016  . Pressure injury of skin 02/25/2016  . Hematoma of chest wall, left, initial encounter 02/24/2016  . Fever 02/24/2016  . Supratherapeutic INR 02/24/2016  . Dementia 03/04/2015  . Hypotension 12/03/2014  . Weight loss, unintentional 10/17/2013  . History of elevated PSA 04/25/2013  . Encounter for therapeutic drug monitoring 02/26/2013  . Long term current use of anticoagulant therapy 12/19/2012  . Atrial flutter ([place]) 12/14/2012  . S/P AVR 06/13/2012  . Other vitamin B12 deficiency anemia 02/29/2012  . Memory disorder 02/14/2012  . Hx of CABG 07/01/2010  . Hyperlipidemia 07/01/2010  . Benign hypertensive heart disease without heart failure 07/01/2010    Past Surgical  History:  Procedure Laterality Date  . AORTIC VALVE REPLACEMENT  12/17/08  . CARDIAC CATHETERIZATION    . CORONARY ANGIOPLASTY     NORMAL LEFT VENTRICULAR SIZEAND CONTRACTILITY WITH NORMAL SYSTOLIC FUNCTION. EF 65%  . TONSILLECTOMY         Home Medications    Prior to Admission medications   Medication Sig Start Date End Date Taking? Authorizing Provider  acetaminophen (TYLENOL) 500 MG tablet Take 500 mg by mouth every 6 (six) hours as needed (for pain).     [provider]  atorvastatin (LIPITOR) 40 MG tablet Take 1 tablet (40 mg total) daily by mouth. 12/12/16   Burchette, Alinda Sierras, MD  calcium carbonate (OS-CAL) 600 MG TABS Take 600 mg by mouth 2 (two) times daily with a meal.      [provider]  Cholecalciferol (VITAMIN D) 2000 UNITS tablet Take 2,000 Units by mouth daily.      [provider]  donepezil (ARICEPT) 10 MG tablet TAKE 1 TABLET (10 MG TOTAL) BY MOUTH AT BEDTIME. 12/28/15   Burchette, Alinda Sierras, MD  Melatonin 1 MG CAPS Take 1 capsule (1 mg total) by mouth at bedtime. 09/13/16   Burchette, Alinda Sierras, MD  memantine (NAMENDA) 10 MG tablet TAKE   1 TABLET (10MG ) BY MOUTH TWICE DAILY Patient taking differently: Take 10 mg by mouth 2 (two) times daily. TAKE   1 TABLET (10MG ) BY MOUTH TWICE DAILY 09/13/16   Burchette, Alinda Sierras, MD  metoprolol tartrate (LOPRESSOR) 25 MG tablet TAKE  ONE-HALF TABLETS (12.5 MG TOTAL) BY MOUTH DAILY. 11/11/16   Burchette, Alinda Sierras, MD  Multiple Vitamin (MULTIVITAMIN) tablet Take 1 tablet by mouth daily.      [provider]  risperiDONE (RISPERDAL) 0.25 MG tablet Take 1 tablet (0.25 mg total) by mouth 2 (two) times daily. 09/13/16   Burchette, Alinda Sierras, MD  triamcinolone cream (KENALOG) 0.1 % Apply 1 application topically 2 (two) times daily as needed. 12/27/16   Burchette, Alinda Sierras, MD  vitamin C (ASCORBIC ACID) 500 MG tablet Take 500 mg by mouth daily.      [provider]  warfarin (COUMADIN) 5 MG tablet TAKE AS  DIRECTED BY ANTICOAGULATION CLINIC Patient taking differently: Take 7.5 mg on all day except Wednesday take 5 mg 08/26/16   Burchette, Alinda Sierras, MD    Family History Family History  Problem Relation Age of Onset  . Ankylosing spondylitis Brother     Social History Social History   Tobacco Use  . Smoking status: Former Smoker    Last attempt to quit: 06/30/2006    Years since quitting: 10.5  . Smokeless tobacco: Never Used  Substance Use Topics  . Alcohol use: No  . Drug use: No     Allergies   Penicillins   Review of Systems Review of Systems  Unable to perform ROS: Dementia  Constitutional: Negative for fever.  HENT: Negative for sore throat.   Eyes: Negative for visual disturbance.  Respiratory: Negative for cough and shortness of breath.   Cardiovascular: Negative for chest pain.  Gastrointestinal: Negative for abdominal pain, diarrhea, nausea and vomiting.  Genitourinary: Negative for difficulty urinating.  Musculoskeletal: Negative for back pain and neck stiffness.  Skin: Negative for rash.  Neurological: Positive for weakness (per facility). Negative for syncope, facial asymmetry, numbness and headaches.     Physical Exam Updated Vital Signs BP 123/78 (BP Location: Left Arm)   Pulse 84   Temp (!) 97.5 F (36.4 C) (Oral)   Resp 16   SpO2 98%   Physical Exam  Constitutional: He is oriented to person, place, and time. He appears well-developed and well-nourished. No distress.  HENT:  Head: Normocephalic.  Healing scalp abrasion  Eyes: Conjunctivae and EOM are normal.  Neck: Normal range of motion.  Cardiovascular: Normal rate, regular rhythm, normal heart sounds and intact distal pulses. Exam reveals no gallop and no friction rub.  No murmur heard. Pulmonary/Chest: Effort normal and breath sounds normal. No respiratory distress. He has no wheezes. He has no rales.  Abdominal: Soft. He exhibits no distension. There is no tenderness. There is no guarding.    Musculoskeletal: He exhibits no edema.  Neurological: He is alert and oriented to person, place, and time. He has normal strength. No cranial nerve deficit or sensory deficit. Coordination (normal finger to nose) normal. GCS eye subscore is 4. GCS verbal subscore is 5. GCS motor subscore is 6.  Patient with exuberant strength on exam   Skin: Skin is warm and dry. He is not diaphoretic.  Nursing note and vitals reviewed.    ED Treatments / Results  Labs (all labs ordered are listed, but only abnormal results are displayed) Labs Reviewed  URINE CULTURE  CBC WITH DIFFERENTIAL/PLATELET  COMPREHENSIVE METABOLIC PANEL  LIPASE, BLOOD  MAGNESIUM  BRAIN NATRIURETIC PEPTIDE  URINALYSIS, ROUTINE W REFLEX MICROSCOPIC  PROTIME-INR  I-STAT TROPONIN, ED  I-STAT CG4 LACTIC ACID, ED    EKG  EKG Interpretation  Date/Time:  Saturday January 14 2017 10:13:52 EST  Ventricular Rate:  61 PR Interval:    QRS Duration: 96 QT Interval:  484 QTC Calculation: 428 R Axis:   -53 Text Interpretation:  Suspect sinus bradycardia, some p waves difficult to detect, however overall appear to be pw with QRS, lower suspicion for  slow afib or junctional rhythm Atrial premature complexes in couplets Prolonged PR interval Inferior infarct, old Since prior ECG, no significant changes Confirmed by Gareth Morgan [phone]) on 01/14/2017 11:16:20 AM       Radiology Dg Chest 2 View  Result Date: 01/14/2017 CLINICAL DATA:  Weakness and falls. EXAM: CHEST  2 VIEW COMPARISON:  12/26/2016 FINDINGS: The heart size is stable status post prior aortic valve replacement. Lungs show probable pulmonary venous hypertension without overt airspace edema. No focal airspace consolidation, pleural effusion or pneumothorax identified. Bony structures are unremarkable. IMPRESSION: Pulmonary venous hypertensive changes without overt edema. Electronically Signed   By: Aletta Edouard M.D.   On: 01/14/2017 10:56     Procedures Procedures (including critical care time)  Medications Ordered in ED Medications - No data to display   Initial Impression / Assessment and Plan / ED Course  I have reviewed the triage vital signs and the nursing notes.  Pertinent labs & imaging results that were available during my care of the patient were reviewed by me and considered in my medical decision making (see chart for details).    83 year old male with a history of coronary artery disease, hypertension, hyperlipidemia, aortic stenosis, dementia, atrial flutter on Coumadin, presents with concern for generalized weakness and fall.  Attempted to call facility, who reports they are unable to provide more of a report regarding the reason for him coming to the ED.  Patient denies any symptoms, has excellent strength on exam, normal neuro exam without signs of CVA. History provided does not sound concerning for TIA.  Given limited history, obtained wide range of testing to evaluate their concerns for generalized weakness. Given report of fall on coumadin, CT head done shows no sign of acute abnormality.    Labs show no significant changes in hgb, electrolytes.  UA without signs of UTI.  CXR without pneumonia or pulmonary edema.  Patient here recently for change in gait, had negative MRI, appeared shuffling and Neuro follow up was recommended for evaluation for parkinsonism.  Recommend continued Neuro follow up.  Patient ambulatory in the ED today with steady gait.    Discussed with daughter, and feel symptoms of difficulty ambulating may also be secondary to worsening dementia.   Discussed contacting outpatient social work to discuss possible transfer from independent living (with 24hr provided care) to SNF. Also recommend follow up with PCP to discuss risks/benefits of coumadin given fall risk.  Patient discharged in stable condition with understanding of reasons to return.    Final Clinical Impressions(s) / ED Diagnoses    Final diagnoses:  Generalized weakness  Fall, initial encounter    ED Discharge Orders    None       Gareth Morgan, MD 01/14/17 2201

## 2017-01-14 NOTE — ED Notes (Signed)
For ride home: [phone] [facility].

## 2017-01-14 NOTE — ED Triage Notes (Signed)
Pt from [place] c/o weakness upon standing. Pt c/o more falls recently. Pt stood up this am and sts "legs went out form under me." Pt has hx of dementia and is poor historian. Pt in NAD

## 2017-01-14 NOTE — ED Triage Notes (Signed)
He comes to [place] from [place] with c/o repeated falls. He states his "legs give out". He has no particular c/o pain nor discomfort nor injury at this time. He is confused and alert at his baseline. 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

## 2017-01-14 NOTE — ED Notes (Signed)
Bed: OM85 Expected date:  Expected time:  Means of arrival:  Comments: 83 yo fall, wekaness

## 2017-01-15 LAB — URINE CULTURE: Culture: NO GROWTH

## 2017-01-16 ENCOUNTER — Ambulatory Visit (INDEPENDENT_AMBULATORY_CARE_PROVIDER_SITE_OTHER): Payer: Medicare Other | Admitting: General Practice

## 2017-01-16 DIAGNOSIS — Z952 Presence of prosthetic heart valve: Secondary | ICD-10-CM

## 2017-01-16 DIAGNOSIS — Z7901 Long term (current) use of anticoagulants: Secondary | ICD-10-CM

## 2017-01-16 LAB — POCT INR: INR: 2.5

## 2017-01-16 NOTE — Patient Instructions (Addendum)
Pre visit review using our clinic review tool, if applicable. No additional management support is needed unless otherwise documented below in the visit note.  Continue to take 7.5 mg all days except 5 mg only on Wednesdays.  Fax results to [place], RN @ [facility] @ [phone].  Re-check in 4 weeks.

## 2017-01-19 ENCOUNTER — Other Ambulatory Visit: Payer: Self-pay | Admitting: Adult Health

## 2017-02-08 ENCOUNTER — Encounter: Payer: Self-pay | Admitting: Family Medicine

## 2017-02-08 ENCOUNTER — Ambulatory Visit (INDEPENDENT_AMBULATORY_CARE_PROVIDER_SITE_OTHER): Payer: Medicare Other | Admitting: Family Medicine

## 2017-02-08 VITALS — BP 120/80 | HR 71 | Temp 97.5°F | Wt 171.8 lb

## 2017-02-08 DIAGNOSIS — I4892 Unspecified atrial flutter: Secondary | ICD-10-CM

## 2017-02-08 DIAGNOSIS — R410 Disorientation, unspecified: Secondary | ICD-10-CM | POA: Diagnosis not present

## 2017-02-08 DIAGNOSIS — F0280 Dementia in other diseases classified elsewhere without behavioral disturbance: Secondary | ICD-10-CM | POA: Diagnosis not present

## 2017-02-08 DIAGNOSIS — R319 Hematuria, unspecified: Secondary | ICD-10-CM | POA: Diagnosis not present

## 2017-02-08 DIAGNOSIS — G309 Alzheimer's disease, unspecified: Secondary | ICD-10-CM

## 2017-02-08 LAB — POCT URINALYSIS DIPSTICK
BILIRUBIN UA: NEGATIVE
Glucose, UA: NEGATIVE
Ketones, UA: NEGATIVE
NITRITE UA: NEGATIVE
PH UA: 7.5 (ref 5.0–8.0)
PROTEIN UA: NEGATIVE
Spec Grav, UA: 1.015 (ref 1.010–1.025)
UROBILINOGEN UA: 0.2 U/dL

## 2017-02-08 MED ORDER — CEPHALEXIN 500 MG PO CAPS: 500.0000 mg | ORAL_CAPSULE | Freq: Three times a day (TID) | ORAL | 0 refills | Status: DC

## 2017-02-08 NOTE — Patient Instructions (Signed)
Stay well hydrated Start the antibiotic We will call with the urine culture result Follow up for any fever or other concerns.

## 2017-02-08 NOTE — Progress Notes (Signed)
Subjective:     Patient ID: Marvin Wagner, male   DOB: 1930/10/25, 84 y.o.   MRN: [id number]  HPI Patient seen following episode of gross hematuria several days ago. He is here accompanied by caregiver. He has advanced dementia. They state that he had one episode of visible blood in urine over the weekend but none since then. He is not complaining of any burning with urination. No fevers or chills. He has been somewhat more confused than usual though he has fairly advanced Alzheimer's disease at baseline.  He is on Coumadin for atrial fibrillation. Last INR 2.5. He has assistance with all of his medications. No known history of kidney stones.  No reported appetite or weight changes. No other bleeding complications.  Past Medical History:  Diagnosis Date  . Aortic insufficiency and aortic stenosis   . Aortic stenosis    SEVERE  . CAD (coronary artery disease)   . Heart murmur   . Hyperlipidemia   . Hypertension   . Mild memory disturbance    Past Surgical History:  Procedure Laterality Date  . AORTIC VALVE REPLACEMENT  12/17/08  . CARDIAC CATHETERIZATION    . CORONARY ANGIOPLASTY     NORMAL LEFT VENTRICULAR SIZEAND CONTRACTILITY WITH NORMAL SYSTOLIC FUNCTION. EF 65%  . TONSILLECTOMY      reports that he quit smoking about 10 years ago. he has never used smokeless tobacco. He reports that he does not drink alcohol or use drugs. family history includes Ankylosing spondylitis in his brother. Allergies  Allergen Reactions  . Penicillins Other (See Comments)    Has patient had a PCN reaction causing immediate rash, facial/tongue/throat swelling, SOB or lightheadedness with hypotension: Unknown Has patient had a PCN reaction causing severe rash involving mucus membranes or skin necrosis: Unknown Has patient had a PCN reaction that required hospitalization: Unknown Has patient had a PCN reaction occurring within the last 10 years: Unknown If all of the above answers are "NO", then may  proceed with Cephalosporin use.      Review of Systems  Constitutional: Negative for appetite change, chills and fever.  Respiratory: Negative for shortness of breath.   Cardiovascular: Negative for chest pain.  Gastrointestinal: Negative for abdominal pain.  Genitourinary: Positive for hematuria. Negative for difficulty urinating, dysuria and flank pain.  Psychiatric/Behavioral: Positive for confusion.       Objective:   Physical Exam  Constitutional: He appears well-developed and well-nourished.  Cardiovascular: Normal rate.  Irregular rhythm  Pulmonary/Chest: Effort normal and breath sounds normal. No respiratory distress. He has no wheezes. He has no rales.  Musculoskeletal:  Trace edema legs bilaterally  No flank tenderness  Neurological: He is alert.       Assessment:     #1 reported episode of gross hematuria several days ago. Urinalysis today shows only small leukocytes and trace blood with negative nitrites. Rule out infection.  #2 history of atrial fibrillation on chronic Coumadin   #3 advanced dementia with worsening confusion recently      Plan:     -Urine culture sent -Stay well-hydrated -Start Keflex 500 mg 3 times a day pending culture results -Follow-up for any fever or recurrent gross hematuria. Not sure urologic workup for hematuria would be in his best interest given his advanced dementia but would need to discuss with his medical power of attorney if this recurs  Eulas Post MD [facility]

## 2017-02-09 LAB — URINE CULTURE
MICRO NUMBER: 90034853
SPECIMEN QUALITY:: ADEQUATE

## 2017-02-13 ENCOUNTER — Ambulatory Visit (INDEPENDENT_AMBULATORY_CARE_PROVIDER_SITE_OTHER): Payer: Medicare Other | Admitting: General Practice

## 2017-02-13 DIAGNOSIS — Z7901 Long term (current) use of anticoagulants: Secondary | ICD-10-CM | POA: Diagnosis not present

## 2017-02-13 DIAGNOSIS — I4891 Unspecified atrial fibrillation: Secondary | ICD-10-CM | POA: Diagnosis not present

## 2017-02-13 LAB — POCT INR: INR: 3.5

## 2017-02-13 NOTE — Patient Instructions (Addendum)
Pre visit review using our clinic review tool, if applicable. No additional management support is needed unless otherwise documented below in the visit note.  Skip dosage today (1/14) and then change dosage and take 7.5 mg all days except 5 mg on Wednesdays and Saturdays.  Fax results to [place], RN @ [facility] @ [phone].  Re-check in 3 weeks.

## 2017-02-15 ENCOUNTER — Telehealth: Payer: Self-pay | Admitting: Family Medicine

## 2017-02-15 ENCOUNTER — Other Ambulatory Visit: Payer: Self-pay | Admitting: Adult Health

## 2017-02-15 NOTE — Telephone Encounter (Signed)
Thank you!  This has been resolved.

## 2017-02-15 NOTE — Telephone Encounter (Signed)
Copied from [place] [id number]. Topic: General - Other >> Feb 15, 2017  9:29 AM Darl Householder, RMA wrote: Reason for CRM: Armando Gang from [facility] is call to notify Dr. Elease Hashimoto that patient was accidentally given his daytime medication last night therefore he has missed his coumadin dosage 7.5 mg. Please return call to [phone]

## 2017-02-24 ENCOUNTER — Other Ambulatory Visit: Payer: Self-pay | Admitting: *Deleted

## 2017-02-24 MED ORDER — MELATONIN 1 MG PO CAPS: 1.0000 mg | ORAL_CAPSULE | Freq: Every day | ORAL | 5 refills | Status: DC

## 2017-02-24 MED ORDER — MEMANTINE HCL 10 MG PO TABS: 10.0000 mg | ORAL_TABLET | Freq: Two times a day (BID) | ORAL | 1 refills | Status: DC

## 2017-02-24 MED ORDER — MEMANTINE HCL 10 MG PO TABS: 10.0000 mg | ORAL_TABLET | Freq: Two times a day (BID) | ORAL | 0 refills | Status: DC

## 2017-02-24 MED ORDER — RISPERIDONE 0.25 MG PO TABS: 0.2500 mg | ORAL_TABLET | Freq: Two times a day (BID) | ORAL | 5 refills | Status: DC

## 2017-02-24 NOTE — Telephone Encounter (Signed)
Refills OK. 

## 2017-02-24 NOTE — Telephone Encounter (Signed)
Risperidone 0.25mg  okay to refill?

## 2017-02-25 ENCOUNTER — Other Ambulatory Visit: Payer: Self-pay | Admitting: Adult Health

## 2017-02-27 ENCOUNTER — Telehealth: Payer: Self-pay | Admitting: Family Medicine

## 2017-02-27 NOTE — Telephone Encounter (Signed)
Copied from [place] [id number]. Topic: General - Other >> Feb 27, 2017 10:05 AM Lolita Rieger, RMA wrote: Reason for CRM: Anderson Malta from  [facility] called in reference to mood and change of care please call her back [phone]

## 2017-02-27 NOTE — Telephone Encounter (Signed)
Copied from [place]. Topic: General - Other >> Feb 27, 2017 10:16 AM Carolyn Stare wrote:  Marvin Wagner who is the [place]'s caregiver with [name] call to say that the guardian would like Dr Elease Hashimoto to know that she is trying to move them to [facility]. The manager of [facility] Danise Mina thinks this is not a good move for the [place]'s and would like to discuss with Dr Elease Hashimoto    [phone]

## 2017-02-27 NOTE — Telephone Encounter (Signed)
Left message on machine returning Marvin Wagner's call

## 2017-02-27 NOTE — Telephone Encounter (Signed)
Marvin Wagner returned your call (via [name]). Please call her.

## 2017-02-27 NOTE — Telephone Encounter (Signed)
I spoke with Anderson Malta.   They have some concerns regarding how the [place] will adapt in a new environment without 24/7 assistance.  They will be transferred to [facility] tomorrow.

## 2017-02-28 NOTE — Telephone Encounter (Signed)
Spoke with patient.

## 2017-02-28 NOTE — Telephone Encounter (Signed)
noted 

## 2017-03-01 ENCOUNTER — Encounter: Payer: Self-pay | Admitting: Family Medicine

## 2017-03-01 ENCOUNTER — Telehealth: Payer: Self-pay | Admitting: Family Medicine

## 2017-03-01 DIAGNOSIS — Z952 Presence of prosthetic heart valve: Secondary | ICD-10-CM

## 2017-03-01 NOTE — Telephone Encounter (Signed)
Spoke with Anthoney Harada (geriatrician) who saw Mr Hammerschmidt in consultation.  She has concerns regarding his risk of falls and bleeding on anti-coagulation.  He is on Coumadin.  Hx of AVR and atrial fib.  I explained that I did not feel comfortable changing his anti-coagulation with hx of AVR- but would happy to send to cardiology to reassess.  He previously saw Dr Mare Ferrari (who has retired).  I will set up referral to Dr Tollie Eth.

## 2017-03-06 ENCOUNTER — Ambulatory Visit (INDEPENDENT_AMBULATORY_CARE_PROVIDER_SITE_OTHER): Payer: Medicare Other | Admitting: General Practice

## 2017-03-06 DIAGNOSIS — I4892 Unspecified atrial flutter: Secondary | ICD-10-CM

## 2017-03-06 DIAGNOSIS — Z952 Presence of prosthetic heart valve: Secondary | ICD-10-CM

## 2017-03-06 DIAGNOSIS — Z7901 Long term (current) use of anticoagulants: Secondary | ICD-10-CM

## 2017-03-06 NOTE — Patient Instructions (Signed)
Pre visit review using our clinic review tool, if applicable. No additional management support is needed unless otherwise documented below in the visit note. 

## 2017-03-07 DIAGNOSIS — M6281 Muscle weakness (generalized): Secondary | ICD-10-CM | POA: Diagnosis not present

## 2017-03-07 DIAGNOSIS — R2681 Unsteadiness on feet: Secondary | ICD-10-CM | POA: Diagnosis not present

## 2017-03-07 DIAGNOSIS — R488 Other symbolic dysfunctions: Secondary | ICD-10-CM | POA: Diagnosis not present

## 2017-03-07 DIAGNOSIS — R278 Other lack of coordination: Secondary | ICD-10-CM | POA: Diagnosis not present

## 2017-03-07 DIAGNOSIS — R296 Repeated falls: Secondary | ICD-10-CM | POA: Diagnosis not present

## 2017-03-09 DIAGNOSIS — R296 Repeated falls: Secondary | ICD-10-CM | POA: Diagnosis not present

## 2017-03-09 DIAGNOSIS — R278 Other lack of coordination: Secondary | ICD-10-CM | POA: Diagnosis not present

## 2017-03-09 DIAGNOSIS — M6281 Muscle weakness (generalized): Secondary | ICD-10-CM | POA: Diagnosis not present

## 2017-03-09 DIAGNOSIS — R488 Other symbolic dysfunctions: Secondary | ICD-10-CM | POA: Diagnosis not present

## 2017-03-09 DIAGNOSIS — R2681 Unsteadiness on feet: Secondary | ICD-10-CM | POA: Diagnosis not present

## 2017-03-10 DIAGNOSIS — R2681 Unsteadiness on feet: Secondary | ICD-10-CM | POA: Diagnosis not present

## 2017-03-10 DIAGNOSIS — M6281 Muscle weakness (generalized): Secondary | ICD-10-CM | POA: Diagnosis not present

## 2017-03-10 DIAGNOSIS — R296 Repeated falls: Secondary | ICD-10-CM | POA: Diagnosis not present

## 2017-03-10 DIAGNOSIS — R278 Other lack of coordination: Secondary | ICD-10-CM | POA: Diagnosis not present

## 2017-03-10 DIAGNOSIS — R488 Other symbolic dysfunctions: Secondary | ICD-10-CM | POA: Diagnosis not present

## 2017-03-13 DIAGNOSIS — R488 Other symbolic dysfunctions: Secondary | ICD-10-CM | POA: Diagnosis not present

## 2017-03-13 DIAGNOSIS — M6281 Muscle weakness (generalized): Secondary | ICD-10-CM | POA: Diagnosis not present

## 2017-03-13 DIAGNOSIS — R2681 Unsteadiness on feet: Secondary | ICD-10-CM | POA: Diagnosis not present

## 2017-03-13 DIAGNOSIS — R278 Other lack of coordination: Secondary | ICD-10-CM | POA: Diagnosis not present

## 2017-03-13 DIAGNOSIS — R296 Repeated falls: Secondary | ICD-10-CM | POA: Diagnosis not present

## 2017-03-14 DIAGNOSIS — R488 Other symbolic dysfunctions: Secondary | ICD-10-CM | POA: Diagnosis not present

## 2017-03-14 DIAGNOSIS — R2681 Unsteadiness on feet: Secondary | ICD-10-CM | POA: Diagnosis not present

## 2017-03-14 DIAGNOSIS — M6281 Muscle weakness (generalized): Secondary | ICD-10-CM | POA: Diagnosis not present

## 2017-03-14 DIAGNOSIS — R296 Repeated falls: Secondary | ICD-10-CM | POA: Diagnosis not present

## 2017-03-14 DIAGNOSIS — R278 Other lack of coordination: Secondary | ICD-10-CM | POA: Diagnosis not present

## 2017-03-15 DIAGNOSIS — M6281 Muscle weakness (generalized): Secondary | ICD-10-CM | POA: Diagnosis not present

## 2017-03-15 DIAGNOSIS — R278 Other lack of coordination: Secondary | ICD-10-CM | POA: Diagnosis not present

## 2017-03-15 DIAGNOSIS — R296 Repeated falls: Secondary | ICD-10-CM | POA: Diagnosis not present

## 2017-03-15 DIAGNOSIS — R2681 Unsteadiness on feet: Secondary | ICD-10-CM | POA: Diagnosis not present

## 2017-03-15 DIAGNOSIS — R488 Other symbolic dysfunctions: Secondary | ICD-10-CM | POA: Diagnosis not present

## 2017-03-16 DIAGNOSIS — M6281 Muscle weakness (generalized): Secondary | ICD-10-CM | POA: Diagnosis not present

## 2017-03-16 DIAGNOSIS — R2681 Unsteadiness on feet: Secondary | ICD-10-CM | POA: Diagnosis not present

## 2017-03-16 DIAGNOSIS — R488 Other symbolic dysfunctions: Secondary | ICD-10-CM | POA: Diagnosis not present

## 2017-03-16 DIAGNOSIS — R278 Other lack of coordination: Secondary | ICD-10-CM | POA: Diagnosis not present

## 2017-03-16 DIAGNOSIS — R296 Repeated falls: Secondary | ICD-10-CM | POA: Diagnosis not present

## 2017-03-17 DIAGNOSIS — E7849 Other hyperlipidemia: Secondary | ICD-10-CM | POA: Diagnosis not present

## 2017-03-17 DIAGNOSIS — E039 Hypothyroidism, unspecified: Secondary | ICD-10-CM | POA: Diagnosis not present

## 2017-03-17 DIAGNOSIS — I480 Paroxysmal atrial fibrillation: Secondary | ICD-10-CM | POA: Diagnosis not present

## 2017-03-17 DIAGNOSIS — I4892 Unspecified atrial flutter: Secondary | ICD-10-CM | POA: Diagnosis not present

## 2017-03-17 DIAGNOSIS — I2510 Atherosclerotic heart disease of native coronary artery without angina pectoris: Secondary | ICD-10-CM | POA: Diagnosis not present

## 2017-03-17 DIAGNOSIS — Z7901 Long term (current) use of anticoagulants: Secondary | ICD-10-CM | POA: Diagnosis not present

## 2017-03-17 DIAGNOSIS — Z952 Presence of prosthetic heart valve: Secondary | ICD-10-CM | POA: Diagnosis not present

## 2017-03-17 DIAGNOSIS — F0391 Unspecified dementia with behavioral disturbance: Secondary | ICD-10-CM | POA: Diagnosis not present

## 2017-03-20 DIAGNOSIS — R278 Other lack of coordination: Secondary | ICD-10-CM | POA: Diagnosis not present

## 2017-03-20 DIAGNOSIS — R296 Repeated falls: Secondary | ICD-10-CM | POA: Diagnosis not present

## 2017-03-20 DIAGNOSIS — R488 Other symbolic dysfunctions: Secondary | ICD-10-CM | POA: Diagnosis not present

## 2017-03-20 DIAGNOSIS — M6281 Muscle weakness (generalized): Secondary | ICD-10-CM | POA: Diagnosis not present

## 2017-03-20 DIAGNOSIS — R2681 Unsteadiness on feet: Secondary | ICD-10-CM | POA: Diagnosis not present

## 2017-03-21 DIAGNOSIS — R296 Repeated falls: Secondary | ICD-10-CM | POA: Diagnosis not present

## 2017-03-21 DIAGNOSIS — R488 Other symbolic dysfunctions: Secondary | ICD-10-CM | POA: Diagnosis not present

## 2017-03-21 DIAGNOSIS — R278 Other lack of coordination: Secondary | ICD-10-CM | POA: Diagnosis not present

## 2017-03-21 DIAGNOSIS — M6281 Muscle weakness (generalized): Secondary | ICD-10-CM | POA: Diagnosis not present

## 2017-03-21 DIAGNOSIS — R2681 Unsteadiness on feet: Secondary | ICD-10-CM | POA: Diagnosis not present

## 2017-03-22 DIAGNOSIS — R296 Repeated falls: Secondary | ICD-10-CM | POA: Diagnosis not present

## 2017-03-22 DIAGNOSIS — R488 Other symbolic dysfunctions: Secondary | ICD-10-CM | POA: Diagnosis not present

## 2017-03-22 DIAGNOSIS — R278 Other lack of coordination: Secondary | ICD-10-CM | POA: Diagnosis not present

## 2017-03-22 DIAGNOSIS — R2681 Unsteadiness on feet: Secondary | ICD-10-CM | POA: Diagnosis not present

## 2017-03-22 DIAGNOSIS — M6281 Muscle weakness (generalized): Secondary | ICD-10-CM | POA: Diagnosis not present

## 2017-03-23 DIAGNOSIS — M6281 Muscle weakness (generalized): Secondary | ICD-10-CM | POA: Diagnosis not present

## 2017-03-23 DIAGNOSIS — R278 Other lack of coordination: Secondary | ICD-10-CM | POA: Diagnosis not present

## 2017-03-23 DIAGNOSIS — R296 Repeated falls: Secondary | ICD-10-CM | POA: Diagnosis not present

## 2017-03-23 DIAGNOSIS — R2681 Unsteadiness on feet: Secondary | ICD-10-CM | POA: Diagnosis not present

## 2017-03-23 DIAGNOSIS — R488 Other symbolic dysfunctions: Secondary | ICD-10-CM | POA: Diagnosis not present

## 2017-03-24 DIAGNOSIS — R278 Other lack of coordination: Secondary | ICD-10-CM | POA: Diagnosis not present

## 2017-03-24 DIAGNOSIS — R2681 Unsteadiness on feet: Secondary | ICD-10-CM | POA: Diagnosis not present

## 2017-03-24 DIAGNOSIS — M6281 Muscle weakness (generalized): Secondary | ICD-10-CM | POA: Diagnosis not present

## 2017-03-24 DIAGNOSIS — R296 Repeated falls: Secondary | ICD-10-CM | POA: Diagnosis not present

## 2017-03-24 DIAGNOSIS — R488 Other symbolic dysfunctions: Secondary | ICD-10-CM | POA: Diagnosis not present

## 2017-03-27 DIAGNOSIS — M6281 Muscle weakness (generalized): Secondary | ICD-10-CM | POA: Diagnosis not present

## 2017-03-27 DIAGNOSIS — R488 Other symbolic dysfunctions: Secondary | ICD-10-CM | POA: Diagnosis not present

## 2017-03-27 DIAGNOSIS — R296 Repeated falls: Secondary | ICD-10-CM | POA: Diagnosis not present

## 2017-03-27 DIAGNOSIS — R278 Other lack of coordination: Secondary | ICD-10-CM | POA: Diagnosis not present

## 2017-03-27 DIAGNOSIS — R2681 Unsteadiness on feet: Secondary | ICD-10-CM | POA: Diagnosis not present

## 2017-03-28 DIAGNOSIS — R2681 Unsteadiness on feet: Secondary | ICD-10-CM | POA: Diagnosis not present

## 2017-03-28 DIAGNOSIS — R296 Repeated falls: Secondary | ICD-10-CM | POA: Diagnosis not present

## 2017-03-28 DIAGNOSIS — R278 Other lack of coordination: Secondary | ICD-10-CM | POA: Diagnosis not present

## 2017-03-28 DIAGNOSIS — M6281 Muscle weakness (generalized): Secondary | ICD-10-CM | POA: Diagnosis not present

## 2017-03-28 DIAGNOSIS — R488 Other symbolic dysfunctions: Secondary | ICD-10-CM | POA: Diagnosis not present

## 2017-03-29 DIAGNOSIS — M6281 Muscle weakness (generalized): Secondary | ICD-10-CM | POA: Diagnosis not present

## 2017-03-29 DIAGNOSIS — R488 Other symbolic dysfunctions: Secondary | ICD-10-CM | POA: Diagnosis not present

## 2017-03-29 DIAGNOSIS — R296 Repeated falls: Secondary | ICD-10-CM | POA: Diagnosis not present

## 2017-03-29 DIAGNOSIS — R278 Other lack of coordination: Secondary | ICD-10-CM | POA: Diagnosis not present

## 2017-03-29 DIAGNOSIS — R2681 Unsteadiness on feet: Secondary | ICD-10-CM | POA: Diagnosis not present

## 2017-03-30 DIAGNOSIS — R296 Repeated falls: Secondary | ICD-10-CM | POA: Diagnosis not present

## 2017-03-30 DIAGNOSIS — M6281 Muscle weakness (generalized): Secondary | ICD-10-CM | POA: Diagnosis not present

## 2017-03-30 DIAGNOSIS — R488 Other symbolic dysfunctions: Secondary | ICD-10-CM | POA: Diagnosis not present

## 2017-03-30 DIAGNOSIS — R2681 Unsteadiness on feet: Secondary | ICD-10-CM | POA: Diagnosis not present

## 2017-03-30 DIAGNOSIS — R278 Other lack of coordination: Secondary | ICD-10-CM | POA: Diagnosis not present

## 2017-03-31 DIAGNOSIS — R278 Other lack of coordination: Secondary | ICD-10-CM | POA: Diagnosis not present

## 2017-03-31 DIAGNOSIS — M6281 Muscle weakness (generalized): Secondary | ICD-10-CM | POA: Diagnosis not present

## 2017-03-31 DIAGNOSIS — R488 Other symbolic dysfunctions: Secondary | ICD-10-CM | POA: Diagnosis not present

## 2017-03-31 DIAGNOSIS — R2681 Unsteadiness on feet: Secondary | ICD-10-CM | POA: Diagnosis not present

## 2017-03-31 DIAGNOSIS — R296 Repeated falls: Secondary | ICD-10-CM | POA: Diagnosis not present

## 2017-04-04 DIAGNOSIS — R278 Other lack of coordination: Secondary | ICD-10-CM | POA: Diagnosis not present

## 2017-04-04 DIAGNOSIS — R488 Other symbolic dysfunctions: Secondary | ICD-10-CM | POA: Diagnosis not present

## 2017-04-04 DIAGNOSIS — R2681 Unsteadiness on feet: Secondary | ICD-10-CM | POA: Diagnosis not present

## 2017-04-04 DIAGNOSIS — M6281 Muscle weakness (generalized): Secondary | ICD-10-CM | POA: Diagnosis not present

## 2017-04-04 DIAGNOSIS — R296 Repeated falls: Secondary | ICD-10-CM | POA: Diagnosis not present

## 2017-04-05 DIAGNOSIS — R488 Other symbolic dysfunctions: Secondary | ICD-10-CM | POA: Diagnosis not present

## 2017-04-05 DIAGNOSIS — R296 Repeated falls: Secondary | ICD-10-CM | POA: Diagnosis not present

## 2017-04-05 DIAGNOSIS — R278 Other lack of coordination: Secondary | ICD-10-CM | POA: Diagnosis not present

## 2017-04-05 DIAGNOSIS — R2681 Unsteadiness on feet: Secondary | ICD-10-CM | POA: Diagnosis not present

## 2017-04-05 DIAGNOSIS — M6281 Muscle weakness (generalized): Secondary | ICD-10-CM | POA: Diagnosis not present

## 2017-04-06 DIAGNOSIS — R278 Other lack of coordination: Secondary | ICD-10-CM | POA: Diagnosis not present

## 2017-04-06 DIAGNOSIS — R488 Other symbolic dysfunctions: Secondary | ICD-10-CM | POA: Diagnosis not present

## 2017-04-06 DIAGNOSIS — R2681 Unsteadiness on feet: Secondary | ICD-10-CM | POA: Diagnosis not present

## 2017-04-06 DIAGNOSIS — M6281 Muscle weakness (generalized): Secondary | ICD-10-CM | POA: Diagnosis not present

## 2017-04-06 DIAGNOSIS — R296 Repeated falls: Secondary | ICD-10-CM | POA: Diagnosis not present

## 2017-04-07 DIAGNOSIS — M6281 Muscle weakness (generalized): Secondary | ICD-10-CM | POA: Diagnosis not present

## 2017-04-07 DIAGNOSIS — R488 Other symbolic dysfunctions: Secondary | ICD-10-CM | POA: Diagnosis not present

## 2017-04-07 DIAGNOSIS — R296 Repeated falls: Secondary | ICD-10-CM | POA: Diagnosis not present

## 2017-04-07 DIAGNOSIS — R278 Other lack of coordination: Secondary | ICD-10-CM | POA: Diagnosis not present

## 2017-04-07 DIAGNOSIS — R2681 Unsteadiness on feet: Secondary | ICD-10-CM | POA: Diagnosis not present

## 2017-04-11 ENCOUNTER — Telehealth: Payer: Self-pay | Admitting: Family Medicine

## 2017-04-11 DIAGNOSIS — R488 Other symbolic dysfunctions: Secondary | ICD-10-CM | POA: Diagnosis not present

## 2017-04-11 DIAGNOSIS — R278 Other lack of coordination: Secondary | ICD-10-CM | POA: Diagnosis not present

## 2017-04-11 DIAGNOSIS — R296 Repeated falls: Secondary | ICD-10-CM | POA: Diagnosis not present

## 2017-04-11 DIAGNOSIS — R2681 Unsteadiness on feet: Secondary | ICD-10-CM | POA: Diagnosis not present

## 2017-04-11 DIAGNOSIS — M6281 Muscle weakness (generalized): Secondary | ICD-10-CM | POA: Diagnosis not present

## 2017-04-11 NOTE — Telephone Encounter (Signed)
Copied from [place] [id number]. Topic: Quick Communication - See Telephone Encounter >> Apr 11, 2017  4:51 PM Cleaster Corin, NT wrote: CRM for notification. See Telephone encounter for:   04/11/17. Thalia Party Calling from corporation of guardianship to speak with Dr. Elease Hashimoto or nurse about pt. Needing paper work that has to be turned in to the irs before April 15th. Lelon Frohlich has the details what what the letter needs to state and would like for someone to give her a call,. Lelon Frohlich can be reached at [phone]

## 2017-04-12 DIAGNOSIS — R2681 Unsteadiness on feet: Secondary | ICD-10-CM | POA: Diagnosis not present

## 2017-04-12 DIAGNOSIS — R278 Other lack of coordination: Secondary | ICD-10-CM | POA: Diagnosis not present

## 2017-04-12 DIAGNOSIS — R296 Repeated falls: Secondary | ICD-10-CM | POA: Diagnosis not present

## 2017-04-12 DIAGNOSIS — R488 Other symbolic dysfunctions: Secondary | ICD-10-CM | POA: Diagnosis not present

## 2017-04-12 DIAGNOSIS — M6281 Muscle weakness (generalized): Secondary | ICD-10-CM | POA: Diagnosis not present

## 2017-04-13 DIAGNOSIS — M6281 Muscle weakness (generalized): Secondary | ICD-10-CM | POA: Diagnosis not present

## 2017-04-13 DIAGNOSIS — R488 Other symbolic dysfunctions: Secondary | ICD-10-CM | POA: Diagnosis not present

## 2017-04-13 DIAGNOSIS — R2681 Unsteadiness on feet: Secondary | ICD-10-CM | POA: Diagnosis not present

## 2017-04-13 DIAGNOSIS — R278 Other lack of coordination: Secondary | ICD-10-CM | POA: Diagnosis not present

## 2017-04-13 DIAGNOSIS — R296 Repeated falls: Secondary | ICD-10-CM | POA: Diagnosis not present

## 2017-04-14 DIAGNOSIS — R488 Other symbolic dysfunctions: Secondary | ICD-10-CM | POA: Diagnosis not present

## 2017-04-14 DIAGNOSIS — M6281 Muscle weakness (generalized): Secondary | ICD-10-CM | POA: Diagnosis not present

## 2017-04-14 DIAGNOSIS — R296 Repeated falls: Secondary | ICD-10-CM | POA: Diagnosis not present

## 2017-04-14 DIAGNOSIS — R2681 Unsteadiness on feet: Secondary | ICD-10-CM | POA: Diagnosis not present

## 2017-04-14 DIAGNOSIS — R278 Other lack of coordination: Secondary | ICD-10-CM | POA: Diagnosis not present

## 2017-04-17 DIAGNOSIS — R278 Other lack of coordination: Secondary | ICD-10-CM | POA: Diagnosis not present

## 2017-04-17 DIAGNOSIS — R296 Repeated falls: Secondary | ICD-10-CM | POA: Diagnosis not present

## 2017-04-17 DIAGNOSIS — R2681 Unsteadiness on feet: Secondary | ICD-10-CM | POA: Diagnosis not present

## 2017-04-17 DIAGNOSIS — R488 Other symbolic dysfunctions: Secondary | ICD-10-CM | POA: Diagnosis not present

## 2017-04-17 DIAGNOSIS — M6281 Muscle weakness (generalized): Secondary | ICD-10-CM | POA: Diagnosis not present

## 2017-04-18 DIAGNOSIS — R278 Other lack of coordination: Secondary | ICD-10-CM | POA: Diagnosis not present

## 2017-04-18 DIAGNOSIS — R296 Repeated falls: Secondary | ICD-10-CM | POA: Diagnosis not present

## 2017-04-18 DIAGNOSIS — R2681 Unsteadiness on feet: Secondary | ICD-10-CM | POA: Diagnosis not present

## 2017-04-18 DIAGNOSIS — M6281 Muscle weakness (generalized): Secondary | ICD-10-CM | POA: Diagnosis not present

## 2017-04-18 DIAGNOSIS — R488 Other symbolic dysfunctions: Secondary | ICD-10-CM | POA: Diagnosis not present

## 2017-04-20 DIAGNOSIS — R2681 Unsteadiness on feet: Secondary | ICD-10-CM | POA: Diagnosis not present

## 2017-04-20 DIAGNOSIS — R488 Other symbolic dysfunctions: Secondary | ICD-10-CM | POA: Diagnosis not present

## 2017-04-20 DIAGNOSIS — R278 Other lack of coordination: Secondary | ICD-10-CM | POA: Diagnosis not present

## 2017-04-20 DIAGNOSIS — R296 Repeated falls: Secondary | ICD-10-CM | POA: Diagnosis not present

## 2017-04-20 DIAGNOSIS — M6281 Muscle weakness (generalized): Secondary | ICD-10-CM | POA: Diagnosis not present

## 2017-04-21 DIAGNOSIS — R488 Other symbolic dysfunctions: Secondary | ICD-10-CM | POA: Diagnosis not present

## 2017-04-21 DIAGNOSIS — R2681 Unsteadiness on feet: Secondary | ICD-10-CM | POA: Diagnosis not present

## 2017-04-21 DIAGNOSIS — R296 Repeated falls: Secondary | ICD-10-CM | POA: Diagnosis not present

## 2017-04-21 DIAGNOSIS — R278 Other lack of coordination: Secondary | ICD-10-CM | POA: Diagnosis not present

## 2017-04-21 DIAGNOSIS — M6281 Muscle weakness (generalized): Secondary | ICD-10-CM | POA: Diagnosis not present

## 2017-04-24 NOTE — Telephone Encounter (Signed)
Left message on machine for Ann to return our call.  CRM created

## 2017-04-25 ENCOUNTER — Ambulatory Visit: Payer: Medicare Other | Admitting: Neurology

## 2017-04-25 DIAGNOSIS — R488 Other symbolic dysfunctions: Secondary | ICD-10-CM | POA: Diagnosis not present

## 2017-04-25 DIAGNOSIS — M6281 Muscle weakness (generalized): Secondary | ICD-10-CM | POA: Diagnosis not present

## 2017-04-25 DIAGNOSIS — R2681 Unsteadiness on feet: Secondary | ICD-10-CM | POA: Diagnosis not present

## 2017-04-25 DIAGNOSIS — R296 Repeated falls: Secondary | ICD-10-CM | POA: Diagnosis not present

## 2017-04-25 DIAGNOSIS — R278 Other lack of coordination: Secondary | ICD-10-CM | POA: Diagnosis not present

## 2017-04-26 DIAGNOSIS — R2681 Unsteadiness on feet: Secondary | ICD-10-CM | POA: Diagnosis not present

## 2017-04-26 DIAGNOSIS — R278 Other lack of coordination: Secondary | ICD-10-CM | POA: Diagnosis not present

## 2017-04-26 DIAGNOSIS — R488 Other symbolic dysfunctions: Secondary | ICD-10-CM | POA: Diagnosis not present

## 2017-04-26 DIAGNOSIS — M6281 Muscle weakness (generalized): Secondary | ICD-10-CM | POA: Diagnosis not present

## 2017-04-26 DIAGNOSIS — R296 Repeated falls: Secondary | ICD-10-CM | POA: Diagnosis not present

## 2017-04-27 DIAGNOSIS — R296 Repeated falls: Secondary | ICD-10-CM | POA: Diagnosis not present

## 2017-04-27 DIAGNOSIS — R2681 Unsteadiness on feet: Secondary | ICD-10-CM | POA: Diagnosis not present

## 2017-04-27 DIAGNOSIS — R488 Other symbolic dysfunctions: Secondary | ICD-10-CM | POA: Diagnosis not present

## 2017-04-27 DIAGNOSIS — R278 Other lack of coordination: Secondary | ICD-10-CM | POA: Diagnosis not present

## 2017-04-27 DIAGNOSIS — M6281 Muscle weakness (generalized): Secondary | ICD-10-CM | POA: Diagnosis not present

## 2017-05-01 DIAGNOSIS — R2681 Unsteadiness on feet: Secondary | ICD-10-CM | POA: Diagnosis not present

## 2017-05-01 DIAGNOSIS — R278 Other lack of coordination: Secondary | ICD-10-CM | POA: Diagnosis not present

## 2017-05-01 DIAGNOSIS — R488 Other symbolic dysfunctions: Secondary | ICD-10-CM | POA: Diagnosis not present

## 2017-05-01 DIAGNOSIS — R296 Repeated falls: Secondary | ICD-10-CM | POA: Diagnosis not present

## 2017-05-01 DIAGNOSIS — M6281 Muscle weakness (generalized): Secondary | ICD-10-CM | POA: Diagnosis not present

## 2017-05-02 DIAGNOSIS — R488 Other symbolic dysfunctions: Secondary | ICD-10-CM | POA: Diagnosis not present

## 2017-05-02 DIAGNOSIS — R296 Repeated falls: Secondary | ICD-10-CM | POA: Diagnosis not present

## 2017-05-02 DIAGNOSIS — R2681 Unsteadiness on feet: Secondary | ICD-10-CM | POA: Diagnosis not present

## 2017-05-02 DIAGNOSIS — M6281 Muscle weakness (generalized): Secondary | ICD-10-CM | POA: Diagnosis not present

## 2017-05-02 DIAGNOSIS — R278 Other lack of coordination: Secondary | ICD-10-CM | POA: Diagnosis not present

## 2017-05-03 DIAGNOSIS — M6281 Muscle weakness (generalized): Secondary | ICD-10-CM | POA: Diagnosis not present

## 2017-05-03 DIAGNOSIS — R2681 Unsteadiness on feet: Secondary | ICD-10-CM | POA: Diagnosis not present

## 2017-05-03 DIAGNOSIS — R278 Other lack of coordination: Secondary | ICD-10-CM | POA: Diagnosis not present

## 2017-05-03 DIAGNOSIS — R488 Other symbolic dysfunctions: Secondary | ICD-10-CM | POA: Diagnosis not present

## 2017-05-03 DIAGNOSIS — R296 Repeated falls: Secondary | ICD-10-CM | POA: Diagnosis not present

## 2017-05-04 ENCOUNTER — Ambulatory Visit: Payer: Self-pay | Admitting: General Practice

## 2017-05-04 DIAGNOSIS — R488 Other symbolic dysfunctions: Secondary | ICD-10-CM | POA: Diagnosis not present

## 2017-05-04 DIAGNOSIS — R296 Repeated falls: Secondary | ICD-10-CM | POA: Diagnosis not present

## 2017-05-04 DIAGNOSIS — R278 Other lack of coordination: Secondary | ICD-10-CM | POA: Diagnosis not present

## 2017-05-04 DIAGNOSIS — M6281 Muscle weakness (generalized): Secondary | ICD-10-CM | POA: Diagnosis not present

## 2017-05-04 DIAGNOSIS — R2681 Unsteadiness on feet: Secondary | ICD-10-CM | POA: Diagnosis not present

## 2017-05-05 DIAGNOSIS — R296 Repeated falls: Secondary | ICD-10-CM | POA: Diagnosis not present

## 2017-05-05 DIAGNOSIS — R2681 Unsteadiness on feet: Secondary | ICD-10-CM | POA: Diagnosis not present

## 2017-05-05 DIAGNOSIS — R278 Other lack of coordination: Secondary | ICD-10-CM | POA: Diagnosis not present

## 2017-05-05 DIAGNOSIS — M6281 Muscle weakness (generalized): Secondary | ICD-10-CM | POA: Diagnosis not present

## 2017-05-05 DIAGNOSIS — R488 Other symbolic dysfunctions: Secondary | ICD-10-CM | POA: Diagnosis not present

## 2017-05-08 DIAGNOSIS — R2681 Unsteadiness on feet: Secondary | ICD-10-CM | POA: Diagnosis not present

## 2017-05-08 DIAGNOSIS — R278 Other lack of coordination: Secondary | ICD-10-CM | POA: Diagnosis not present

## 2017-05-08 DIAGNOSIS — R488 Other symbolic dysfunctions: Secondary | ICD-10-CM | POA: Diagnosis not present

## 2017-05-08 DIAGNOSIS — R296 Repeated falls: Secondary | ICD-10-CM | POA: Diagnosis not present

## 2017-05-08 DIAGNOSIS — M6281 Muscle weakness (generalized): Secondary | ICD-10-CM | POA: Diagnosis not present

## 2017-05-09 DIAGNOSIS — M6281 Muscle weakness (generalized): Secondary | ICD-10-CM | POA: Diagnosis not present

## 2017-05-09 DIAGNOSIS — R278 Other lack of coordination: Secondary | ICD-10-CM | POA: Diagnosis not present

## 2017-05-09 DIAGNOSIS — R2681 Unsteadiness on feet: Secondary | ICD-10-CM | POA: Diagnosis not present

## 2017-05-09 DIAGNOSIS — R488 Other symbolic dysfunctions: Secondary | ICD-10-CM | POA: Diagnosis not present

## 2017-05-09 DIAGNOSIS — R296 Repeated falls: Secondary | ICD-10-CM | POA: Diagnosis not present

## 2017-05-10 DIAGNOSIS — R488 Other symbolic dysfunctions: Secondary | ICD-10-CM | POA: Diagnosis not present

## 2017-05-10 DIAGNOSIS — M6281 Muscle weakness (generalized): Secondary | ICD-10-CM | POA: Diagnosis not present

## 2017-05-10 DIAGNOSIS — R278 Other lack of coordination: Secondary | ICD-10-CM | POA: Diagnosis not present

## 2017-05-10 DIAGNOSIS — R2681 Unsteadiness on feet: Secondary | ICD-10-CM | POA: Diagnosis not present

## 2017-05-10 DIAGNOSIS — R296 Repeated falls: Secondary | ICD-10-CM | POA: Diagnosis not present

## 2017-05-11 DIAGNOSIS — R296 Repeated falls: Secondary | ICD-10-CM | POA: Diagnosis not present

## 2017-05-11 DIAGNOSIS — R488 Other symbolic dysfunctions: Secondary | ICD-10-CM | POA: Diagnosis not present

## 2017-05-11 DIAGNOSIS — R2681 Unsteadiness on feet: Secondary | ICD-10-CM | POA: Diagnosis not present

## 2017-05-11 DIAGNOSIS — R278 Other lack of coordination: Secondary | ICD-10-CM | POA: Diagnosis not present

## 2017-05-11 DIAGNOSIS — M6281 Muscle weakness (generalized): Secondary | ICD-10-CM | POA: Diagnosis not present

## 2017-05-15 DIAGNOSIS — R296 Repeated falls: Secondary | ICD-10-CM | POA: Diagnosis not present

## 2017-05-15 DIAGNOSIS — M6281 Muscle weakness (generalized): Secondary | ICD-10-CM | POA: Diagnosis not present

## 2017-05-15 DIAGNOSIS — R488 Other symbolic dysfunctions: Secondary | ICD-10-CM | POA: Diagnosis not present

## 2017-05-15 DIAGNOSIS — R2681 Unsteadiness on feet: Secondary | ICD-10-CM | POA: Diagnosis not present

## 2017-05-15 DIAGNOSIS — R278 Other lack of coordination: Secondary | ICD-10-CM | POA: Diagnosis not present

## 2017-05-18 DIAGNOSIS — M6281 Muscle weakness (generalized): Secondary | ICD-10-CM | POA: Diagnosis not present

## 2017-05-18 DIAGNOSIS — R2681 Unsteadiness on feet: Secondary | ICD-10-CM | POA: Diagnosis not present

## 2017-05-18 DIAGNOSIS — R296 Repeated falls: Secondary | ICD-10-CM | POA: Diagnosis not present

## 2017-05-18 DIAGNOSIS — R488 Other symbolic dysfunctions: Secondary | ICD-10-CM | POA: Diagnosis not present

## 2017-05-18 DIAGNOSIS — R278 Other lack of coordination: Secondary | ICD-10-CM | POA: Diagnosis not present

## 2017-05-22 DIAGNOSIS — M6281 Muscle weakness (generalized): Secondary | ICD-10-CM | POA: Diagnosis not present

## 2017-05-22 DIAGNOSIS — R296 Repeated falls: Secondary | ICD-10-CM | POA: Diagnosis not present

## 2017-05-22 DIAGNOSIS — R488 Other symbolic dysfunctions: Secondary | ICD-10-CM | POA: Diagnosis not present

## 2017-05-22 DIAGNOSIS — R2681 Unsteadiness on feet: Secondary | ICD-10-CM | POA: Diagnosis not present

## 2017-05-22 DIAGNOSIS — R278 Other lack of coordination: Secondary | ICD-10-CM | POA: Diagnosis not present

## 2017-05-24 DIAGNOSIS — M6281 Muscle weakness (generalized): Secondary | ICD-10-CM | POA: Diagnosis not present

## 2017-05-24 DIAGNOSIS — R278 Other lack of coordination: Secondary | ICD-10-CM | POA: Diagnosis not present

## 2017-05-24 DIAGNOSIS — R488 Other symbolic dysfunctions: Secondary | ICD-10-CM | POA: Diagnosis not present

## 2017-05-24 DIAGNOSIS — R296 Repeated falls: Secondary | ICD-10-CM | POA: Diagnosis not present

## 2017-05-24 DIAGNOSIS — R2681 Unsteadiness on feet: Secondary | ICD-10-CM | POA: Diagnosis not present

## 2017-05-25 DIAGNOSIS — M6281 Muscle weakness (generalized): Secondary | ICD-10-CM | POA: Diagnosis not present

## 2017-05-25 DIAGNOSIS — R296 Repeated falls: Secondary | ICD-10-CM | POA: Diagnosis not present

## 2017-05-25 DIAGNOSIS — R278 Other lack of coordination: Secondary | ICD-10-CM | POA: Diagnosis not present

## 2017-05-25 DIAGNOSIS — R488 Other symbolic dysfunctions: Secondary | ICD-10-CM | POA: Diagnosis not present

## 2017-05-25 DIAGNOSIS — R2681 Unsteadiness on feet: Secondary | ICD-10-CM | POA: Diagnosis not present

## 2017-06-12 DIAGNOSIS — L603 Nail dystrophy: Secondary | ICD-10-CM | POA: Diagnosis not present

## 2017-06-12 DIAGNOSIS — B351 Tinea unguium: Secondary | ICD-10-CM | POA: Diagnosis not present

## 2017-06-12 DIAGNOSIS — I739 Peripheral vascular disease, unspecified: Secondary | ICD-10-CM | POA: Diagnosis not present

## 2017-06-12 DIAGNOSIS — Q845 Enlarged and hypertrophic nails: Secondary | ICD-10-CM | POA: Diagnosis not present

## 2017-09-20 DIAGNOSIS — I959 Hypotension, unspecified: Secondary | ICD-10-CM | POA: Diagnosis not present

## 2017-09-21 ENCOUNTER — Emergency Department (HOSPITAL_COMMUNITY)
Admission: EM | Admit: 2017-09-21 | Discharge: 2017-09-21 | Disposition: A | Discharge status: Home / Self Care | Payer: Medicare Other | Attending: Emergency Medicine | Admitting: Emergency Medicine

## 2017-09-21 ENCOUNTER — Encounter (HOSPITAL_COMMUNITY): Payer: Self-pay | Admitting: Emergency Medicine

## 2017-09-21 ENCOUNTER — Other Ambulatory Visit: Payer: Self-pay

## 2017-09-21 DIAGNOSIS — R52 Pain, unspecified: Secondary | ICD-10-CM | POA: Diagnosis not present

## 2017-09-21 DIAGNOSIS — R4182 Altered mental status, unspecified: Secondary | ICD-10-CM | POA: Diagnosis not present

## 2017-09-21 DIAGNOSIS — Z87891 Personal history of nicotine dependence: Secondary | ICD-10-CM | POA: Diagnosis not present

## 2017-09-21 DIAGNOSIS — Z7982 Long term (current) use of aspirin: Secondary | ICD-10-CM | POA: Insufficient documentation

## 2017-09-21 DIAGNOSIS — E785 Hyperlipidemia, unspecified: Secondary | ICD-10-CM | POA: Diagnosis not present

## 2017-09-21 DIAGNOSIS — R531 Weakness: Secondary | ICD-10-CM | POA: Diagnosis not present

## 2017-09-21 DIAGNOSIS — I2510 Atherosclerotic heart disease of native coronary artery without angina pectoris: Secondary | ICD-10-CM | POA: Diagnosis not present

## 2017-09-21 DIAGNOSIS — Z743 Need for continuous supervision: Secondary | ICD-10-CM | POA: Diagnosis not present

## 2017-09-21 DIAGNOSIS — I10 Essential (primary) hypertension: Secondary | ICD-10-CM | POA: Diagnosis not present

## 2017-09-21 DIAGNOSIS — Z7901 Long term (current) use of anticoagulants: Secondary | ICD-10-CM | POA: Insufficient documentation

## 2017-09-21 DIAGNOSIS — R112 Nausea with vomiting, unspecified: Secondary | ICD-10-CM | POA: Insufficient documentation

## 2017-09-21 DIAGNOSIS — R279 Unspecified lack of coordination: Secondary | ICD-10-CM | POA: Diagnosis not present

## 2017-09-21 DIAGNOSIS — Z79899 Other long term (current) drug therapy: Secondary | ICD-10-CM | POA: Insufficient documentation

## 2017-09-21 LAB — CBC WITH DIFFERENTIAL/PLATELET
BASOS ABS: 0 10*3/uL (ref 0.0–0.1)
Basophils Relative: 0 %
EOS ABS: 0.1 10*3/uL (ref 0.0–0.7)
EOS PCT: 1 %
HCT: 35.2 % — ABNORMAL LOW (ref 39.0–52.0)
HEMOGLOBIN: 12 g/dL — AB (ref 13.0–17.0)
LYMPHS PCT: 8 %
Lymphs Abs: 0.6 10*3/uL — ABNORMAL LOW (ref 0.7–4.0)
MCH: 33.8 pg (ref 26.0–34.0)
MCHC: 34.1 g/dL (ref 30.0–36.0)
MCV: 99.2 fL (ref 78.0–100.0)
Monocytes Absolute: 0.5 10*3/uL (ref 0.1–1.0)
Monocytes Relative: 8 %
NEUTROS PCT: 83 %
Neutro Abs: 6 10*3/uL (ref 1.7–7.7)
PLATELETS: 125 10*3/uL — AB (ref 150–400)
RBC: 3.55 MIL/uL — ABNORMAL LOW (ref 4.22–5.81)
RDW: 12.7 % (ref 11.5–15.5)
WBC: 7.2 10*3/uL (ref 4.0–10.5)

## 2017-09-21 LAB — BASIC METABOLIC PANEL
ANION GAP: 7 (ref 5–15)
BUN: 22 mg/dL (ref 8–23)
CO2: 26 mmol/L (ref 22–32)
Calcium: 8 mg/dL — ABNORMAL LOW (ref 8.9–10.3)
Chloride: 108 mmol/L (ref 98–111)
Creatinine, Ser: 0.88 mg/dL (ref 0.61–1.24)
Glucose, Bld: 106 mg/dL — ABNORMAL HIGH (ref 70–99)
Potassium: 3.8 mmol/L (ref 3.5–5.1)
SODIUM: 141 mmol/L (ref 135–145)

## 2017-09-21 LAB — URINALYSIS, ROUTINE W REFLEX MICROSCOPIC
BILIRUBIN URINE: NEGATIVE
Glucose, UA: NEGATIVE mg/dL
Hgb urine dipstick: NEGATIVE
KETONES UR: NEGATIVE mg/dL
Leukocytes, UA: NEGATIVE
Nitrite: NEGATIVE
PH: 7 (ref 5.0–8.0)
PROTEIN: NEGATIVE mg/dL
Specific Gravity, Urine: 1.015 (ref 1.005–1.030)

## 2017-09-21 LAB — I-STAT TROPONIN, ED: TROPONIN I, POC: 0.01 ng/mL (ref 0.00–0.08)

## 2017-09-21 MED ORDER — ONDANSETRON HCL 4 MG/2ML IJ SOLN
4.0000 mg | Freq: Once | INTRAMUSCULAR | Status: AC
  Administered 2017-09-21: 4 mg via INTRAVENOUS
  Filled 2017-09-21: qty 2

## 2017-09-21 MED ORDER — ONDANSETRON 8 MG PO TBDP: 8.0000 mg | ORAL_TABLET | Freq: Three times a day (TID) | ORAL | 0 refills | Status: DC | PRN

## 2017-09-21 NOTE — ED Notes (Signed)
Bed: AF42 Expected date:  Expected time:  Means of arrival:  Comments: 35 M from SNF Vomiting

## 2017-09-21 NOTE — ED Provider Notes (Signed)
[place] DEPT Provider Note: Georgena Spurling, MD, FACEP  CSN: [id number] MRN: [id number] ARRIVAL: 09/21/17 at [place]: WA24/[id number]   CHIEF COMPLAINT  Vomiting  Level 5 caveat: Dementia HISTORY OF PRESENT ILLNESS  09/21/17 12:47 AM Marvin Wagner is a 84 y.o. male who was sent from his assisted living facility for nausea and vomiting several hours ago.  The vomiting was reportedly profuse at the time but has subsequently abated.  EMS administered a 500 mL normal saline bolus prior to arrival.  The patient has no complaints at the present time.  He is oriented to person only which is reportedly his baseline.   Past Medical History:  Diagnosis Date  . Aortic insufficiency and aortic stenosis   . Aortic stenosis    SEVERE  . CAD (coronary artery disease)   . Heart murmur   . Hyperlipidemia   . Hypertension   . Mild memory disturbance     Past Surgical History:  Procedure Laterality Date  . AORTIC VALVE REPLACEMENT  12/17/08  . CARDIAC CATHETERIZATION    . CORONARY ANGIOPLASTY     NORMAL LEFT VENTRICULAR SIZEAND CONTRACTILITY WITH NORMAL SYSTOLIC FUNCTION. EF 65%  . TONSILLECTOMY      Family History  Problem Relation Age of Onset  . Ankylosing spondylitis Brother     Social History   Tobacco Use  . Smoking status: Former Smoker    Last attempt to quit: 06/30/2006    Years since quitting: 11.2  . Smokeless tobacco: Never Used  Substance Use Topics  . Alcohol use: No  . Drug use: No    Prior to Admission medications   Medication Sig Start Date End Date Taking? Authorizing Provider  acetaminophen (TYLENOL) 500 MG tablet Take 500 mg by mouth every 6 (six) hours as needed (for pain).    Yes [provider]  aspirin 81 MG chewable tablet Chew 81 mg by mouth daily.   Yes [provider]  atorvastatin (LIPITOR) 40 MG tablet Take 1 tablet (40 mg total) daily by mouth. 12/12/16  Yes Burchette, Alinda Sierras, MD  donepezil (ARICEPT) 10 MG tablet TAKE 1  TABLET (10 MG TOTAL) BY MOUTH AT BEDTIME. 12/28/15  Yes Burchette, Alinda Sierras, MD  Melatonin 1 MG CAPS Take 1 capsule (1 mg total) by mouth at bedtime. 02/24/17  Yes Burchette, Alinda Sierras, MD  memantine (NAMENDA) 10 MG tablet Take 1 tablet (10 mg total) by mouth 2 (two) times daily. TAKE   1 TABLET (10MG ) BY MOUTH TWICE DAILY Patient taking differently: Take 10 mg by mouth 2 (two) times daily.  02/24/17  Yes Burchette, Alinda Sierras, MD  metoprolol tartrate (LOPRESSOR) 25 MG tablet TAKE ONE-HALF TABLETS (12.5 MG TOTAL) BY MOUTH DAILY. Patient taking differently: Take 12.5 mg by mouth daily.  11/11/16  Yes Burchette, Alinda Sierras, MD  Multiple Vitamin (MULTIVITAMIN) tablet Take 1 tablet by mouth daily.     Yes [provider]  risperiDONE (RISPERDAL) 0.25 MG tablet Take 1 tablet (0.25 mg total) by mouth 2 (two) times daily. Patient taking differently: Take 0.25 mg by mouth every 6 (six) hours as needed. Anxiety 02/24/17  Yes Burchette, Alinda Sierras, MD  warfarin (COUMADIN) 5 MG tablet TAKE AS DIRECTED BY ANTICOAGULATION CLINIC Patient taking differently: Take 5 mg by mouth daily at 6 PM.  08/26/16  Yes Burchette, Alinda Sierras, MD  cephALEXin (KEFLEX) 500 MG capsule Take 1 capsule (500 mg total) by mouth 3 (three) times daily. Patient not taking: Reported on 09/21/2017  02/08/17   Burchette, Alinda Sierras, MD  triamcinolone cream (KENALOG) 0.1 % Apply 1 application topically 2 (two) times daily as needed. Patient not taking: Reported on 09/21/2017 12/27/16   Eulas Post, MD  vitamin C (ASCORBIC ACID) 500 MG tablet Take 500 mg by mouth daily.      [provider]    Allergies Penicillins   REVIEW OF SYSTEMS     PHYSICAL EXAMINATION  Initial Vital Signs Blood pressure 132/83, pulse 77, temperature 98.8 F (37.1 C), temperature source Oral, resp. rate 16, height 5\' 11"  (1.803 m), weight 79.4 kg, SpO2 94 %.  Examination General: Well-developed, well-nourished male in no acute distress; appearance consistent  with age of record HENT: normocephalic; atraumatic Eyes: pupils equal, round and pinpoint; extraocular muscles intact Neck: supple Heart: Irregular rhythm Lungs: clear to auscultation bilaterally Abdomen: soft; nondistended; nontender; no masses or hepatosplenomegaly; bowel sounds present Extremities: No deformity; full range of motion; pulses normal Neurologic: Awake, alert and oriented x 1; motor function intact in all extremities and symmetric; no facial droop Skin: Warm and dry Psychiatric: Normal mood and affect   RESULTS  Summary of this visit's results, reviewed by myself:   EKG Interpretation  Date/Time:  Thursday September 21 2017 01:27:07 EDT Ventricular Rate:  76 PR Interval:    QRS Duration: 101 QT Interval:  400 QTC Calculation: 372 R Axis:   -58 Text Interpretation:  Undetermined rhythm P waves present Left anterior fascicular block Abnormal R-wave progression, early transition Borderline T abnormalities, anterior leads Baseline wander in lead(s) V5 No significant change was found Confirmed by Shanon Rosser [id number]) on 09/21/2017 2:18:00 AM      Laboratory Studies: Results for orders placed or performed during the hospital encounter of 09/21/17 (from the past 24 hour(s))  CBC with Differential/Platelet     Status: Abnormal   Collection Time: 09/21/17  1:25 AM  Result Value Ref Range   WBC 7.2 4.0 - 10.5 K/uL   RBC 3.55 (L) 4.22 - 5.81 MIL/uL   Hemoglobin 12.0 (L) 13.0 - 17.0 g/dL   HCT 35.2 (L) 39.0 - 52.0 %   MCV 99.2 78.0 - 100.0 fL   MCH 33.8 26.0 - 34.0 pg   MCHC 34.1 30.0 - 36.0 g/dL   RDW 12.7 11.5 - 15.5 %   Platelets 125 (L) 150 - 400 K/uL   Neutrophils Relative % 83 %   Neutro Abs 6.0 1.7 - 7.7 K/uL   Lymphocytes Relative 8 %   Lymphs Abs 0.6 (L) 0.7 - 4.0 K/uL   Monocytes Relative 8 %   Monocytes Absolute 0.5 0.1 - 1.0 K/uL   Eosinophils Relative 1 %   Eosinophils Absolute 0.1 0.0 - 0.7 K/uL   Basophils Relative 0 %   Basophils Absolute 0.0 0.0 -  0.1 K/uL  Basic metabolic panel     Status: Abnormal   Collection Time: 09/21/17  1:25 AM  Result Value Ref Range   Sodium 141 135 - 145 mmol/L   Potassium 3.8 3.5 - 5.1 mmol/L   Chloride 108 98 - 111 mmol/L   CO2 26 22 - 32 mmol/L   Glucose, Bld 106 (H) 70 - 99 mg/dL   BUN 22 8 - 23 mg/dL   Creatinine, Ser 0.88 0.61 - 1.24 mg/dL   Calcium 8.0 (L) 8.9 - 10.3 mg/dL   GFR calc non Af Amer >60 >60 mL/min   GFR calc Af Amer >60 >60 mL/min   Anion gap 7 5 - 15  I-stat troponin, ED     Status: None   Collection Time: 09/21/17  1:30 AM  Result Value Ref Range   Troponin i, poc 0.01 0.00 - 0.08 ng/mL   Comment 3          Urinalysis, Routine w reflex microscopic     Status: None   Collection Time: 09/21/17  3:05 AM  Result Value Ref Range   Color, Urine YELLOW YELLOW   APPearance CLEAR CLEAR   Specific Gravity, Urine 1.015 1.005 - 1.030   pH 7.0 5.0 - 8.0   Glucose, UA NEGATIVE NEGATIVE mg/dL   Hgb urine dipstick NEGATIVE NEGATIVE   Bilirubin Urine NEGATIVE NEGATIVE   Ketones, ur NEGATIVE NEGATIVE mg/dL   Protein, ur NEGATIVE NEGATIVE mg/dL   Nitrite NEGATIVE NEGATIVE   Leukocytes, UA NEGATIVE NEGATIVE   Imaging Studies: No results found.  ED COURSE and MDM  Nursing notes and initial vitals signs, including pulse oximetry, reviewed.  Vitals:   09/21/17 0354 09/21/17 0452 09/21/17 0453 09/21/17 0454  BP: (!) 130/59 (!) 157/86    Pulse: (!) 58  (!) 40 81  Resp: 16     Temp:      TempSrc:      SpO2: 93%  90% 95%  Weight:      Height:       5:35 AM Patient has had no emesis in the ED.  He is drinking fluids without difficulty.  Laboratory studies are reassuring and his vital signs have been stable.  I see no need for further intervention or hospitalization.  PROCEDURES    ED DIAGNOSES     ICD-10-CM   1. Nausea and vomiting in adult R11.2        Cloyce Paterson, Jenny Reichmann, MD 09/21/17 [phone]

## 2017-09-21 NOTE — ED Triage Notes (Signed)
Pt arrived with EMS from ALF c/o N/V. Per EMS pt last emesis was 2 hours ago. Pt had NS 560ml bolus from EMS. Pt denies Nausea at this time. Pt denies pain.

## 2017-09-21 NOTE — ED Notes (Signed)
Fluid Challenge Done. Tolerated well.

## 2017-09-22 ENCOUNTER — Emergency Department (HOSPITAL_COMMUNITY): Payer: Medicare Other

## 2017-09-22 ENCOUNTER — Encounter (HOSPITAL_COMMUNITY): Payer: Self-pay | Admitting: Emergency Medicine

## 2017-09-22 ENCOUNTER — Emergency Department (HOSPITAL_COMMUNITY)
Admission: EM | Admit: 2017-09-22 | Discharge: 2017-09-22 | Disposition: A | Discharge status: Home / Self Care | Payer: Medicare Other | Attending: Emergency Medicine | Admitting: Emergency Medicine

## 2017-09-22 DIAGNOSIS — I119 Hypertensive heart disease without heart failure: Secondary | ICD-10-CM | POA: Diagnosis not present

## 2017-09-22 DIAGNOSIS — Z951 Presence of aortocoronary bypass graft: Secondary | ICD-10-CM | POA: Insufficient documentation

## 2017-09-22 DIAGNOSIS — R29898 Other symptoms and signs involving the musculoskeletal system: Secondary | ICD-10-CM | POA: Diagnosis not present

## 2017-09-22 DIAGNOSIS — I2510 Atherosclerotic heart disease of native coronary artery without angina pectoris: Secondary | ICD-10-CM | POA: Insufficient documentation

## 2017-09-22 DIAGNOSIS — R5381 Other malaise: Secondary | ICD-10-CM | POA: Diagnosis not present

## 2017-09-22 DIAGNOSIS — E860 Dehydration: Secondary | ICD-10-CM | POA: Diagnosis not present

## 2017-09-22 DIAGNOSIS — Z955 Presence of coronary angioplasty implant and graft: Secondary | ICD-10-CM | POA: Diagnosis not present

## 2017-09-22 DIAGNOSIS — R1110 Vomiting, unspecified: Secondary | ICD-10-CM | POA: Diagnosis not present

## 2017-09-22 DIAGNOSIS — M2550 Pain in unspecified joint: Secondary | ICD-10-CM | POA: Diagnosis not present

## 2017-09-22 DIAGNOSIS — Z87891 Personal history of nicotine dependence: Secondary | ICD-10-CM | POA: Insufficient documentation

## 2017-09-22 DIAGNOSIS — R404 Transient alteration of awareness: Secondary | ICD-10-CM | POA: Diagnosis not present

## 2017-09-22 DIAGNOSIS — Z79899 Other long term (current) drug therapy: Secondary | ICD-10-CM | POA: Insufficient documentation

## 2017-09-22 DIAGNOSIS — R1111 Vomiting without nausea: Secondary | ICD-10-CM | POA: Diagnosis not present

## 2017-09-22 DIAGNOSIS — R0902 Hypoxemia: Secondary | ICD-10-CM | POA: Diagnosis not present

## 2017-09-22 DIAGNOSIS — F0390 Unspecified dementia without behavioral disturbance: Secondary | ICD-10-CM | POA: Diagnosis not present

## 2017-09-22 DIAGNOSIS — R231 Pallor: Secondary | ICD-10-CM | POA: Diagnosis not present

## 2017-09-22 DIAGNOSIS — R197 Diarrhea, unspecified: Secondary | ICD-10-CM | POA: Diagnosis not present

## 2017-09-22 DIAGNOSIS — Z7982 Long term (current) use of aspirin: Secondary | ICD-10-CM | POA: Insufficient documentation

## 2017-09-22 DIAGNOSIS — Z7401 Bed confinement status: Secondary | ICD-10-CM | POA: Diagnosis not present

## 2017-09-22 DIAGNOSIS — R9431 Abnormal electrocardiogram [ECG] [EKG]: Secondary | ICD-10-CM | POA: Diagnosis not present

## 2017-09-22 LAB — CBC
HCT: 37.6 % — ABNORMAL LOW (ref 39.0–52.0)
Hemoglobin: 12.8 g/dL — ABNORMAL LOW (ref 13.0–17.0)
MCH: 33.9 pg (ref 26.0–34.0)
MCHC: 34 g/dL (ref 30.0–36.0)
MCV: 99.5 fL (ref 78.0–100.0)
PLATELETS: 122 10*3/uL — AB (ref 150–400)
RBC: 3.78 MIL/uL — ABNORMAL LOW (ref 4.22–5.81)
RDW: 13 % (ref 11.5–15.5)
WBC: 7 10*3/uL (ref 4.0–10.5)

## 2017-09-22 LAB — HEPATIC FUNCTION PANEL
ALBUMIN: 3.5 g/dL (ref 3.5–5.0)
ALT: 105 U/L — AB (ref 0–44)
AST: 104 U/L — ABNORMAL HIGH (ref 15–41)
Alkaline Phosphatase: 69 U/L (ref 38–126)
Bilirubin, Direct: 0.3 mg/dL — ABNORMAL HIGH (ref 0.0–0.2)
Indirect Bilirubin: 1.1 mg/dL — ABNORMAL HIGH (ref 0.3–0.9)
TOTAL PROTEIN: 6.3 g/dL — AB (ref 6.5–8.1)
Total Bilirubin: 1.4 mg/dL — ABNORMAL HIGH (ref 0.3–1.2)

## 2017-09-22 NOTE — ED Notes (Signed)
Patient transported home via [place] to [facility]. [facility] staff given patient discharge paperwork. Patient tolerated ham sandwich and ginger ale prior to discharge. Pt denies nausea or abdominal pain.

## 2017-09-22 NOTE — ED Provider Notes (Signed)
[facility] DEPT Provider Note   CSN: [id number] Arrival date & time: 09/22/17  1008     History   Chief Complaint Chief Complaint  Patient presents with  . Nausea  . Emesis   Level 5 caveat: Dementia   HPI Marvin Wagner is a 84 y.o. male.  HPI 84 year old male with vomiting and diarrhea.  Seen in the middle the night for the same and discharged home after normal labs and urine.  Is reported that he went back to the facility and had another episode of vomiting.  Fluids given last night.  500 cc of fluid given by EMS.  On arrival to emergency department the patient is without any complaints.  Denies chest pain shortness of breath.  Denies abdominal pain.  History otherwise limited secondary to dementia.  He does not remember vomiting earlier.   Past Medical History:  Diagnosis Date  . Aortic insufficiency and aortic stenosis   . Aortic stenosis    SEVERE  . CAD (coronary artery disease)   . Heart murmur   . Hyperlipidemia   . Hypertension   . Mild memory disturbance     Patient Active Problem List   Diagnosis Date Noted  . Acute blood loss anemia 02/27/2016  . UTI (urinary tract infection) 02/27/2016  . Pressure injury of skin 02/25/2016  . Hematoma of chest wall, left, initial encounter 02/24/2016  . Fever 02/24/2016  . Supratherapeutic INR 02/24/2016  . Dementia 03/04/2015  . Hypotension 12/03/2014  . Weight loss, unintentional 10/17/2013  . History of elevated PSA 04/25/2013  . Encounter for therapeutic drug monitoring 02/26/2013  . Long term current use of anticoagulant therapy 12/19/2012  . Other vitamin B12 deficiency anemia 02/29/2012  . Memory disorder 02/14/2012  . Hx of CABG 07/01/2010  . Hyperlipidemia 07/01/2010  . Benign hypertensive heart disease without heart failure 07/01/2010    Past Surgical History:  Procedure Laterality Date  . AORTIC VALVE REPLACEMENT  12/17/08  . CARDIAC CATHETERIZATION    . CORONARY  ANGIOPLASTY     NORMAL LEFT VENTRICULAR SIZEAND CONTRACTILITY WITH NORMAL SYSTOLIC FUNCTION. EF 65%  . TONSILLECTOMY          Home Medications    Prior to Admission medications   Medication Sig Start Date End Date Taking? Authorizing Provider  acetaminophen (TYLENOL) 500 MG tablet Take 500 mg by mouth every 6 (six) hours as needed for mild pain.   Yes [provider]  aspirin EC 81 MG tablet Take 81 mg by mouth daily.   Yes [provider]  atorvastatin (LIPITOR) 40 MG tablet Take 1 tablet (40 mg total) daily by mouth. 12/12/16  Yes Burchette, Alinda Sierras, MD  donepezil (ARICEPT) 10 MG tablet TAKE 1 TABLET (10 MG TOTAL) BY MOUTH AT BEDTIME. 12/28/15  Yes Burchette, Alinda Sierras, MD  ENSURE PLUS (ENSURE PLUS) LIQD Take 237 mLs by mouth daily.   Yes [provider]  Melatonin 1 MG CAPS Take 1 capsule (1 mg total) by mouth at bedtime. 02/24/17  Yes Burchette, Alinda Sierras, MD  memantine (NAMENDA) 10 MG tablet Take 1 tablet (10 mg total) by mouth 2 (two) times daily. TAKE   1 TABLET (10MG ) BY MOUTH TWICE DAILY Patient taking differently: Take 10 mg by mouth 2 (two) times daily.  02/24/17  Yes Burchette, Alinda Sierras, MD  metoprolol tartrate (LOPRESSOR) 25 MG tablet TAKE ONE-HALF TABLETS (12.5 MG TOTAL) BY MOUTH DAILY. Patient taking differently: Take 12.5 mg by mouth daily.  11/11/16  Yes Burchette, Alinda Sierras, MD  Multiple Vitamins-Minerals (CENTRUM SILVER 50+MEN) TABS Take 1 tablet by mouth daily.   Yes [provider]  ondansetron (ZOFRAN) 8 MG tablet Take 8 mg by mouth every 8 (eight) hours as needed. 09/21/17  Yes [provider]  risperiDONE (RISPERDAL) 0.25 MG tablet Take 1 tablet (0.25 mg total) by mouth 2 (two) times daily. Patient taking differently: Take 0.25 mg by mouth every 6 (six) hours as needed. Anxiety 02/24/17  Yes Burchette, Alinda Sierras, MD  triamcinolone cream (KENALOG) 0.1 % Apply 1 application topically 2 (two) times daily as needed (rash). Spread topically  to face and hand rash twice a day as needed until resolved   Yes [provider]  ondansetron (ZOFRAN ODT) 8 MG disintegrating tablet Take 1 tablet (8 mg total) by mouth every 8 (eight) hours as needed for nausea or vomiting. Patient not taking: Reported on 09/22/2017 09/21/17   Molpus, John, MD  warfarin (COUMADIN) 5 MG tablet TAKE AS DIRECTED BY ANTICOAGULATION CLINIC Patient not taking: No sig reported 08/26/16   Eulas Post, MD    Family History Family History  Problem Relation Age of Onset  . Ankylosing spondylitis Brother     Social History Social History   Tobacco Use  . Smoking status: Former Smoker    Last attempt to quit: 06/30/2006    Years since quitting: 11.2  . Smokeless tobacco: Never Used  Substance Use Topics  . Alcohol use: No  . Drug use: No     Allergies   Penicillins   Review of Systems Review of Systems  Unable to perform ROS: Dementia     Physical Exam Updated Vital Signs BP (!) 119/98   Pulse 92   Temp 98.2 F (36.8 C) (Oral)   Resp 18   SpO2 92%   Physical Exam  Constitutional: He appears well-developed and well-nourished.  HENT:  Head: Normocephalic and atraumatic.  Eyes: EOM are normal.  Neck: Normal range of motion.  Cardiovascular: Normal rate, regular rhythm and normal heart sounds.  Pulmonary/Chest: Effort normal and breath sounds normal. No respiratory distress.  Abdominal: Soft. He exhibits no distension. There is no tenderness.  Musculoskeletal: Normal range of motion.  Neurological: He is alert.  Skin: Skin is warm and dry.  Psychiatric: He has a normal mood and affect. Judgment normal.  Nursing note and vitals reviewed.    ED Treatments / Results  Labs (all labs ordered are listed, but only abnormal results are displayed) Labs Reviewed  HEPATIC FUNCTION PANEL - Abnormal; Notable for the following components:      Result Value   Total Protein 6.3 (*)    AST 104 (*)    ALT 105 (*)    Total Bilirubin  1.4 (*)    Bilirubin, Direct 0.3 (*)    Indirect Bilirubin 1.1 (*)    All other components within normal limits  CBC - Abnormal; Notable for the following components:   RBC 3.78 (*)    Hemoglobin 12.8 (*)    HCT 37.6 (*)    Platelets 122 (*)    All other components within normal limits    EKG None  Radiology Dg Abd 2 Views  Result Date: 09/22/2017 CLINICAL DATA:  Abdominal pain, diarrhea EXAM: ABDOMEN - 2 VIEW COMPARISON:  None. FINDINGS: There is no bowel dilatation to suggest obstruction. There is no evidence of pneumoperitoneum, portal venous gas or pneumatosis. There are no pathologic calcifications along the expected course of  the ureters. The osseous structures are unremarkable. IMPRESSION: Negative. Electronically Signed   By: Kathreen Devoid   On: 09/22/2017 11:58    Procedures Procedures (including critical care time)  Medications Ordered in ED Medications - No data to display   Initial Impression / Assessment and Plan / ED Course  I have reviewed the triage vital signs and the nursing notes.  Pertinent labs & imaging results that were available during my care of the patient were reviewed by me and considered in my medical decision making (see chart for details).     No abdominal tenderness on examination.  Plain films without air-fluid levels or other acute abnormality.  LFTs in the 100s.  Nonspecific.  No right upper quadrant tenderness.  Outpatient primary care follow-up.  No indication for advanced imaging or additional testing at this time.  No vomiting in the emergency department.  Tolerating fluids.  Final Clinical Impressions(s) / ED Diagnoses   Final diagnoses:  Vomiting    ED Discharge Orders    None       Jola Schmidt, MD 09/22/17 1344

## 2017-09-22 NOTE — ED Notes (Signed)
Bed: KU57 Expected date:  Expected time:  Means of arrival:  Comments: EMS-dehydrated

## 2017-09-22 NOTE — ED Triage Notes (Signed)
Patient arrived per EMS from [facility]. Hx of demential per EMS. Pt c/o of n/v and diarrhea. Pt hasn't eaten anything in past 24 hours per EMS. IV saline lock 22 gauge in lft hand per EMS. 500 mL saline bolus given per EMS. BP 132/68 per EMS.

## 2017-09-22 NOTE — ED Notes (Signed)
[facility] called for patient transport to [facility]

## 2017-09-22 NOTE — ED Notes (Signed)
Patient transported to CT 

## 2017-10-27 DIAGNOSIS — I739 Peripheral vascular disease, unspecified: Secondary | ICD-10-CM | POA: Diagnosis not present

## 2017-10-27 DIAGNOSIS — B351 Tinea unguium: Secondary | ICD-10-CM | POA: Diagnosis not present

## 2017-10-27 DIAGNOSIS — L603 Nail dystrophy: Secondary | ICD-10-CM | POA: Diagnosis not present

## 2017-10-27 DIAGNOSIS — Q845 Enlarged and hypertrophic nails: Secondary | ICD-10-CM | POA: Diagnosis not present

## 2017-12-20 DIAGNOSIS — Z23 Encounter for immunization: Secondary | ICD-10-CM | POA: Diagnosis not present

## 2018-03-02 DIAGNOSIS — Q845 Enlarged and hypertrophic nails: Secondary | ICD-10-CM | POA: Diagnosis not present

## 2018-03-02 DIAGNOSIS — L603 Nail dystrophy: Secondary | ICD-10-CM | POA: Diagnosis not present

## 2018-03-02 DIAGNOSIS — I739 Peripheral vascular disease, unspecified: Secondary | ICD-10-CM | POA: Diagnosis not present

## 2018-06-15 DIAGNOSIS — R1902 Left upper quadrant abdominal swelling, mass and lump: Secondary | ICD-10-CM | POA: Diagnosis not present

## 2018-06-15 DIAGNOSIS — R918 Other nonspecific abnormal finding of lung field: Secondary | ICD-10-CM | POA: Diagnosis not present

## 2018-06-18 ENCOUNTER — Other Ambulatory Visit: Payer: Self-pay

## 2018-06-18 ENCOUNTER — Emergency Department (HOSPITAL_COMMUNITY): Payer: Medicare Other

## 2018-06-18 ENCOUNTER — Inpatient Hospital Stay (HOSPITAL_COMMUNITY)
Admission: EM | Admit: 2018-06-18 | Discharge: 2018-07-06 | DRG: 177 | Disposition: A | Discharge status: Skilled Nursing Facility | Payer: Medicare Other | Attending: Internal Medicine | Admitting: Internal Medicine

## 2018-06-18 DIAGNOSIS — Z7982 Long term (current) use of aspirin: Secondary | ICD-10-CM

## 2018-06-18 DIAGNOSIS — G9341 Metabolic encephalopathy: Secondary | ICD-10-CM | POA: Diagnosis present

## 2018-06-18 DIAGNOSIS — I482 Chronic atrial fibrillation, unspecified: Secondary | ICD-10-CM | POA: Diagnosis present

## 2018-06-18 DIAGNOSIS — J1289 Other viral pneumonia: Secondary | ICD-10-CM | POA: Diagnosis present

## 2018-06-18 DIAGNOSIS — F05 Delirium due to known physiological condition: Secondary | ICD-10-CM | POA: Diagnosis present

## 2018-06-18 DIAGNOSIS — R0902 Hypoxemia: Secondary | ICD-10-CM | POA: Diagnosis present

## 2018-06-18 DIAGNOSIS — I248 Other forms of acute ischemic heart disease: Secondary | ICD-10-CM | POA: Diagnosis not present

## 2018-06-18 DIAGNOSIS — R0602 Shortness of breath: Secondary | ICD-10-CM | POA: Diagnosis not present

## 2018-06-18 DIAGNOSIS — J189 Pneumonia, unspecified organism: Secondary | ICD-10-CM | POA: Diagnosis present

## 2018-06-18 DIAGNOSIS — U071 COVID-19: Principal | ICD-10-CM | POA: Diagnosis present

## 2018-06-18 DIAGNOSIS — Z79899 Other long term (current) drug therapy: Secondary | ICD-10-CM

## 2018-06-18 DIAGNOSIS — R509 Fever, unspecified: Secondary | ICD-10-CM | POA: Diagnosis not present

## 2018-06-18 DIAGNOSIS — F0390 Unspecified dementia without behavioral disturbance: Secondary | ICD-10-CM | POA: Diagnosis present

## 2018-06-18 DIAGNOSIS — I352 Nonrheumatic aortic (valve) stenosis with insufficiency: Secondary | ICD-10-CM | POA: Diagnosis present

## 2018-06-18 DIAGNOSIS — Z8673 Personal history of transient ischemic attack (TIA), and cerebral infarction without residual deficits: Secondary | ICD-10-CM

## 2018-06-18 DIAGNOSIS — I2510 Atherosclerotic heart disease of native coronary artery without angina pectoris: Secondary | ICD-10-CM | POA: Diagnosis present

## 2018-06-18 DIAGNOSIS — E870 Hyperosmolality and hypernatremia: Secondary | ICD-10-CM | POA: Diagnosis present

## 2018-06-18 DIAGNOSIS — Z952 Presence of prosthetic heart valve: Secondary | ICD-10-CM

## 2018-06-18 DIAGNOSIS — D696 Thrombocytopenia, unspecified: Secondary | ICD-10-CM | POA: Diagnosis present

## 2018-06-18 DIAGNOSIS — Z20828 Contact with and (suspected) exposure to other viral communicable diseases: Secondary | ICD-10-CM | POA: Diagnosis not present

## 2018-06-18 DIAGNOSIS — G9340 Encephalopathy, unspecified: Secondary | ICD-10-CM | POA: Diagnosis present

## 2018-06-18 DIAGNOSIS — Z9861 Coronary angioplasty status: Secondary | ICD-10-CM

## 2018-06-18 DIAGNOSIS — R1310 Dysphagia, unspecified: Secondary | ICD-10-CM | POA: Diagnosis present

## 2018-06-18 DIAGNOSIS — I10 Essential (primary) hypertension: Secondary | ICD-10-CM | POA: Diagnosis present

## 2018-06-18 DIAGNOSIS — Z66 Do not resuscitate: Secondary | ICD-10-CM | POA: Diagnosis present

## 2018-06-18 DIAGNOSIS — R4182 Altered mental status, unspecified: Secondary | ICD-10-CM | POA: Diagnosis not present

## 2018-06-18 DIAGNOSIS — Z951 Presence of aortocoronary bypass graft: Secondary | ICD-10-CM

## 2018-06-18 DIAGNOSIS — E876 Hypokalemia: Secondary | ICD-10-CM | POA: Diagnosis present

## 2018-06-18 DIAGNOSIS — N179 Acute kidney failure, unspecified: Secondary | ICD-10-CM | POA: Diagnosis not present

## 2018-06-18 DIAGNOSIS — D539 Nutritional anemia, unspecified: Secondary | ICD-10-CM | POA: Diagnosis present

## 2018-06-18 DIAGNOSIS — Z87891 Personal history of nicotine dependence: Secondary | ICD-10-CM

## 2018-06-18 DIAGNOSIS — R531 Weakness: Secondary | ICD-10-CM | POA: Diagnosis not present

## 2018-06-18 DIAGNOSIS — E785 Hyperlipidemia, unspecified: Secondary | ICD-10-CM | POA: Diagnosis present

## 2018-06-18 LAB — COMPREHENSIVE METABOLIC PANEL
ALT: 38 U/L (ref 0–44)
AST: 76 U/L — ABNORMAL HIGH (ref 15–41)
Albumin: 2.9 g/dL — ABNORMAL LOW (ref 3.5–5.0)
Alkaline Phosphatase: 65 U/L (ref 38–126)
Anion gap: 9 (ref 5–15)
BUN: 36 mg/dL — ABNORMAL HIGH (ref 8–23)
CO2: 23 mmol/L (ref 22–32)
Calcium: 7.6 mg/dL — ABNORMAL LOW (ref 8.9–10.3)
Chloride: 109 mmol/L (ref 98–111)
Creatinine, Ser: 1.16 mg/dL (ref 0.61–1.24)
GFR calc Af Amer: >60 mL/min (ref >60)
GFR calc non Af Amer: 57 mL/min — ABNORMAL LOW (ref >60)
Glucose, Bld: 118 mg/dL — ABNORMAL HIGH (ref 70–99)
Potassium: 3.8 mmol/L (ref 3.5–5.1)
Sodium: 141 mmol/L (ref 135–145)
Total Bilirubin: 0.9 mg/dL (ref 0.3–1.2)
Total Protein: 6.5 g/dL (ref 6.5–8.1)

## 2018-06-18 LAB — CBC WITH DIFFERENTIAL/PLATELET
Abs Immature Granulocytes: 0.07 10*3/uL (ref 0.00–0.07)
Basophils Absolute: 0 10*3/uL (ref 0.0–0.1)
Basophils Relative: 0 %
Eosinophils Absolute: 0 10*3/uL (ref 0.0–0.5)
Eosinophils Relative: 0 %
HCT: 37 % — ABNORMAL LOW (ref 39.0–52.0)
Hemoglobin: 12.3 g/dL — ABNORMAL LOW (ref 13.0–17.0)
Immature Granulocytes: 1 %
Lymphocytes Relative: 12 %
Lymphs Abs: 1 10*3/uL (ref 0.7–4.0)
MCH: 33.6 pg (ref 26.0–34.0)
MCHC: 33.2 g/dL (ref 30.0–36.0)
MCV: 101.1 fL — ABNORMAL HIGH (ref 80.0–100.0)
Monocytes Absolute: 0.5 10*3/uL (ref 0.1–1.0)
Monocytes Relative: 6 %
Neutro Abs: 7 10*3/uL (ref 1.7–7.7)
Neutrophils Relative %: 81 %
Platelets: 145 10*3/uL — ABNORMAL LOW (ref 150–400)
RBC: 3.66 MIL/uL — ABNORMAL LOW (ref 4.22–5.81)
RDW: 13.3 % (ref 11.5–15.5)
WBC: 8.7 10*3/uL (ref 4.0–10.5)
nRBC: 0 % (ref 0.0–0.2)

## 2018-06-18 LAB — URINALYSIS, ROUTINE W REFLEX MICROSCOPIC
Bilirubin Urine: NEGATIVE
Glucose, UA: NEGATIVE mg/dL
Ketones, ur: NEGATIVE mg/dL
Leukocytes,Ua: NEGATIVE
Nitrite: NEGATIVE
Protein, ur: 100 mg/dL — AB
Specific Gravity, Urine: 1.031 — ABNORMAL HIGH (ref 1.005–1.030)
pH: 5 (ref 5.0–8.0)

## 2018-06-18 LAB — SARS CORONAVIRUS 2 BY RT PCR (HOSPITAL ORDER, PERFORMED IN ~~LOC~~ HOSPITAL LAB): SARS Coronavirus 2: NEGATIVE

## 2018-06-18 LAB — LACTIC ACID, PLASMA: Lactic Acid, Venous: 1.1 mmol/L (ref 0.5–1.9)

## 2018-06-18 LAB — TROPONIN I: Troponin I: 0.04 ng/mL (ref <0.03)

## 2018-06-18 MED ORDER — SODIUM CHLORIDE 0.9 % IV SOLN
1.0000 g | Freq: Once | INTRAVENOUS | Status: AC
  Administered 2018-06-18: 1 g via INTRAVENOUS
  Filled 2018-06-18: qty 10

## 2018-06-18 MED ORDER — SODIUM CHLORIDE 0.9 % IV SOLN
500.0000 mg | Freq: Once | INTRAVENOUS | Status: AC
  Administered 2018-06-18: 500 mg via INTRAVENOUS
  Filled 2018-06-18: qty 500

## 2018-06-18 NOTE — ED Notes (Signed)
Attempted to call [facility] to obtain confirmation of code status. Was unable to reach anyone so I left a message for them to call me back.

## 2018-06-18 NOTE — ED Notes (Signed)
Bed: KA76 Expected date:  Expected time:  Means of arrival:  Comments: EMS: [facility]/ [facility]

## 2018-06-18 NOTE — ED Provider Notes (Signed)
[place] DEPT Provider Note   CSN: [id number] Arrival date & time: 06/18/18  2002    History   Chief Complaint Chief Complaint  Patient presents with   Shortness of Breath   Level 5 caveat due to altered mental status/dementia  HPI Marvin Wagner is a 85 y.o. male with history of aortic insufficiency and aortic stenosis, CAD, HLD, HTN, dementia presents sent from memory care facility for evaluation of progressively worsening shortness of breath, altered mental status for 1 week.  He had a chest x-ray on 06/15/2018 at his facility which showed right lower lobe consolidation versus atelectasis but was not started on any antibiotics until today apparently.  Per EMS, the patient has been more confused than usual and may have had some low O2 saturations.  Had a fever of 101.4 F at his facility was given Tylenol there.  The patient denies any pain with me.  He will not answer orientation questions.  He follows some commands but not others.     The history is provided by the patient.    Past Medical History:  Diagnosis Date   Aortic insufficiency and aortic stenosis    Aortic stenosis    SEVERE   CAD (coronary artery disease)    Heart murmur    Hyperlipidemia    Hypertension    Mild memory disturbance     Patient Active Problem List   Diagnosis Date Noted   Acute blood loss anemia 02/27/2016   UTI (urinary tract infection) 02/27/2016   Pressure injury of skin 02/25/2016   Hematoma of chest wall, left, initial encounter 02/24/2016   Fever 02/24/2016   Supratherapeutic INR 02/24/2016   Dementia ([place]) 03/04/2015   Hypotension 12/03/2014   Weight loss, unintentional 10/17/2013   History of elevated PSA 04/25/2013   Encounter for therapeutic drug monitoring 02/26/2013   Long term current use of anticoagulant therapy 12/19/2012   Other vitamin B12 deficiency anemia 02/29/2012   Memory disorder 02/14/2012   Hx of CABG  07/01/2010   Hyperlipidemia 07/01/2010   Benign hypertensive heart disease without heart failure 07/01/2010    Past Surgical History:  Procedure Laterality Date   AORTIC VALVE REPLACEMENT  12/17/08   CARDIAC CATHETERIZATION     CORONARY ANGIOPLASTY     NORMAL LEFT VENTRICULAR SIZEAND CONTRACTILITY WITH NORMAL SYSTOLIC FUNCTION. EF 65%   TONSILLECTOMY          Home Medications    Prior to Admission medications   Medication Sig Start Date End Date Taking? Authorizing Provider  acetaminophen (TYLENOL) 500 MG tablet Take 500 mg by mouth every 6 (six) hours as needed for mild pain.    [provider]  aspirin EC 81 MG tablet Take 81 mg by mouth daily.    [provider]  atorvastatin (LIPITOR) 40 MG tablet Take 1 tablet (40 mg total) daily by mouth. 12/12/16   Burchette, Alinda Sierras, MD  donepezil (ARICEPT) 10 MG tablet TAKE 1 TABLET (10 MG TOTAL) BY MOUTH AT BEDTIME. 12/28/15   Burchette, Alinda Sierras, MD  ENSURE PLUS (ENSURE PLUS) LIQD Take 237 mLs by mouth daily.    [provider]  Melatonin 1 MG CAPS Take 1 capsule (1 mg total) by mouth at bedtime. 02/24/17   Burchette, Alinda Sierras, MD  memantine (NAMENDA) 10 MG tablet Take 1 tablet (10 mg total) by mouth 2 (two) times daily. TAKE   1 TABLET (10MG ) BY MOUTH TWICE DAILY Patient taking differently: Take 10 mg by  mouth 2 (two) times daily.  02/24/17   Burchette, Alinda Sierras, MD  metoprolol tartrate (LOPRESSOR) 25 MG tablet TAKE ONE-HALF TABLETS (12.5 MG TOTAL) BY MOUTH DAILY. Patient taking differently: Take 12.5 mg by mouth daily.  11/11/16   Burchette, Alinda Sierras, MD  Multiple Vitamins-Minerals (CENTRUM SILVER 50+MEN) TABS Take 1 tablet by mouth daily.    [provider]  ondansetron (ZOFRAN ODT) 8 MG disintegrating tablet Take 1 tablet (8 mg total) by mouth every 8 (eight) hours as needed for nausea or vomiting. Patient not taking: Reported on 09/22/2017 09/21/17   Molpus, Jenny Reichmann, MD  ondansetron (ZOFRAN) 8 MG tablet  Take 8 mg by mouth every 8 (eight) hours as needed. 09/21/17   [provider]  risperiDONE (RISPERDAL) 0.25 MG tablet Take 1 tablet (0.25 mg total) by mouth 2 (two) times daily. Patient taking differently: Take 0.25 mg by mouth every 6 (six) hours as needed. Anxiety 02/24/17   Burchette, Alinda Sierras, MD  triamcinolone cream (KENALOG) 0.1 % Apply 1 application topically 2 (two) times daily as needed (rash). Spread topically to face and hand rash twice a day as needed until resolved    [provider]  warfarin (COUMADIN) 5 MG tablet TAKE AS DIRECTED BY ANTICOAGULATION CLINIC Patient not taking: No sig reported 08/26/16   Eulas Post, MD    Family History Family History  Problem Relation Age of Onset   Ankylosing spondylitis Brother     Social History Social History   Tobacco Use   Smoking status: Former Smoker    Last attempt to quit: 06/30/2006    Years since quitting: 11.9   Smokeless tobacco: Never Used  Substance Use Topics   Alcohol use: No   Drug use: No     Allergies   Penicillins   Review of Systems Review of Systems  Unable to perform ROS: Dementia     Physical Exam Updated Vital Signs BP 120/67    Pulse 73    Temp 99.5 F (37.5 C) (Oral)    Resp 19    Ht 6' (1.829 m)    Wt 81 kg    SpO2 100%    BMI 24.22 kg/m   Physical Exam Vitals signs and nursing note reviewed.  Constitutional:      General: He is not in acute distress.    Appearance: He is well-developed.  HENT:     Head: Normocephalic and atraumatic.  Eyes:     General:        Right eye: No discharge.        Left eye: No discharge.     Conjunctiva/sclera: Conjunctivae normal.  Neck:     Vascular: No JVD.     Trachea: No tracheal deviation.  Cardiovascular:     Rate and Rhythm: Normal rate and regular rhythm.     Heart sounds: Murmur present.  Pulmonary:     Effort: Pulmonary effort is normal.     Breath sounds: Examination of the right-lower field reveals rales.  Examination of the left-lower field reveals rales. Decreased breath sounds and rales present.  Abdominal:     General: Bowel sounds are normal. There is no distension.     Palpations: Abdomen is soft. There is no mass.     Tenderness: There is no abdominal tenderness. There is no guarding.  Musculoskeletal:     Right lower leg: He exhibits no tenderness. No edema.     Left lower leg: He exhibits no tenderness. No edema.  Skin:    General: Skin is warm and dry.     Findings: No erythema.  Neurological:     Mental Status: He is alert.     Comments: Will not answer orientation questions. Follows some commands.  Moves extremities spontaneously with intact strength.  Psychiatric:        Behavior: Behavior normal.      ED Treatments / Results  Labs (all labs ordered are listed, but only abnormal results are displayed) Labs Reviewed  CBC WITH DIFFERENTIAL/PLATELET - Abnormal; Notable for the following components:      Result Value   RBC 3.66 (*)    Hemoglobin 12.3 (*)    HCT 37.0 (*)    MCV 101.1 (*)    Platelets 145 (*)    All other components within normal limits  COMPREHENSIVE METABOLIC PANEL - Abnormal; Notable for the following components:   Glucose, Bld 118 (*)    BUN 36 (*)    Calcium 7.6 (*)    Albumin 2.9 (*)    AST 76 (*)    GFR calc non Af Amer 57 (*)    All other components within normal limits  URINALYSIS, ROUTINE W REFLEX MICROSCOPIC - Abnormal; Notable for the following components:   Color, Urine AMBER (*)    APPearance CLOUDY (*)    Specific Gravity, Urine 1.031 (*)    Hgb urine dipstick SMALL (*)    Protein, ur 100 (*)    Bacteria, UA RARE (*)    All other components within normal limits  TROPONIN I - Abnormal; Notable for the following components:   Troponin I 0.04 (*)    All other components within normal limits  SARS CORONAVIRUS 2 (HOSPITAL ORDER, [facility])  CULTURE, BLOOD (ROUTINE X 2)  CULTURE, BLOOD (ROUTINE X 2)    URINE CULTURE  LACTIC ACID, PLASMA  LACTIC ACID, PLASMA    EKG EKG Interpretation  Date/Time:  Monday Jun 18 2018 20:18:25 EDT Ventricular Rate:  90 PR Interval:    QRS Duration: 96 QT Interval:  376 QTC Calculation: 461 R Axis:   -54 Text Interpretation:  Sinus tachycardia Multiform ventricular premature complexes Borderline prolonged PR interval Left anterior fascicular block Abnormal R-wave progression, late transition Abnormal T, consider ischemia, lateral leads no STEMI no sig change of QRS morphology compared to ld Confirmed by Charlesetta Shanks [phone]) on 06/18/2018 10:30:49 PM   Radiology Dg Chest Portable 1 View  Result Date: 06/18/2018 CLINICAL DATA:  85 year old male with recent pneumonia. Fever and shortness of breath. COVID-19 status is pending. EXAM: PORTABLE CHEST 1 VIEW COMPARISON:  01/14/2017 and earlier. FINDINGS: Portable AP semi upright view at 2042 hours. Ill-defined abnormal right upper lobe opacity near the hilum is new since 2018. there is also patchy confluent opacity at the right lateral costophrenic angle. There is mild superimposed asymmetric indistinct and interstitial opacity in both lungs. Chronic prosthetic cardiac valve. Stable cardiac size and mediastinal contours. Calcified aortic atherosclerosis. Visualized tracheal air column is within normal limits. No pneumothorax or pleural effusion. Negative visible bowel gas pattern. No acute osseous abnormality identified. IMPRESSION: Abnormal right greater than left pulmonary opacity most confluent in the right upper lobe opacity. Consider acute pneumonia versus viral/atypical respiratory infection. No pleural effusion. Electronically Signed   By: Genevie Ann M.D.   On: 06/18/2018 21:43    Procedures .Critical Care Performed by: Renita Papa, PA-C Authorized by: Renita Papa, PA-C   Critical care provider statement:  Critical care time (minutes):  35   Critical care was necessary to treat or prevent imminent  or life-threatening deterioration of the following conditions:  Respiratory failure   Critical care was time spent personally by me on the following activities:  Discussions with consultants, evaluation of patient's response to treatment, examination of patient, ordering and performing treatments and interventions, ordering and review of laboratory studies, ordering and review of radiographic studies, pulse oximetry, re-evaluation of patient's condition, obtaining history from patient or surrogate and review of old charts   I assumed direction of critical care for this patient from another provider in my specialty: no     (including critical care time)  Medications Ordered in ED Medications  cefTRIAXone (ROCEPHIN) 1 g in sodium chloride 0.9 % 100 mL IVPB (1 g Intravenous New Bag/Given 06/18/18 2231)  azithromycin (ZITHROMAX) 500 mg in sodium chloride 0.9 % 250 mL IVPB (500 mg Intravenous New Bag/Given 06/18/18 2234)     Initial Impression / Assessment and Plan / ED Course  I have reviewed the triage vital signs and the nursing notes.  Pertinent labs & imaging results that were available during my care of the patient were reviewed by me and considered in my medical decision making (see chart for details).        Patient sent from his memory care facility for evaluation of pneumonia, worsening confusion, fevers, hypoxia.  Low-grade temperature in the ED, SPO2 saturations 89% on room air so he was placed on 2 L nasal cannula with improvement in SPO2 saturations.  Vital signs otherwise stable.  He is nontoxic in appearance.  He is completely altered and will not answer orientation questions.  Lab work shows no leukocytosis, mild anemia.  BUN increased, creatinine within normal limits.  No metabolic derangements.  UA is equivocal for UTI, we will culture.  His troponin is very mildly elevated but his EKG shows no acute ischemic changes.  I think this warrants trending but will hold off on initiation  of heparin unless subsequent troponins are significantly elevated.  Low suspicion of ACS though he does have history of aortic stenosis and cardiac disease. Blood pressures stable in the ED which is reassuring. His chest x-ray does show multifocal pneumonia.  COVID test negative.  He was started on IV antibiotics.  Curb 65 score of 3 and PSI/port score of 150, both indicating hospitalization is warranted.  Spoke with Dr. Hal Hope with [facility] hospitalist service who agrees to assume care of patient and bring him into the hospital for further evaluation and management.  11:05PM  Spoke with patient's daughter Decoda Van [phone]), informed of workup and disposition. She reports his dementia is advanced at baseline. She believes that patient is DNR and would not like any extraordinary life-saving measures.   SYAIRE SABER was evaluated in Emergency Department on 06/18/2018 for the symptoms described in the history of present illness. He was evaluated in the context of the global COVID-19 pandemic, which necessitated consideration that the patient might be at risk for infection with the SARS-CoV-2 virus that causes COVID-19. Institutional protocols and algorithms that pertain to the evaluation of patients at risk for COVID-19 are in a state of rapid change based on information released by regulatory bodies including the CDC and federal and state organizations. These policies and algorithms were followed during the patient's care in the ED.  Final Clinical Impressions(s) / ED Diagnoses   Final diagnoses:  Multifocal pneumonia  Hypoxia    ED Discharge Orders  None       Debroah Baller 06/18/18 2354    Charlesetta Shanks, MD 06/27/18 1715

## 2018-06-18 NOTE — ED Notes (Signed)
Date and time results received: 06/18/18 2218 (use smartphrase ".now" to insert current time)  Test: Troponin Critical Value: 0.04  Name of Provider Notified: Jonathon Jordan. PA  Orders Received? Or Actions Taken?:

## 2018-06-18 NOTE — ED Triage Notes (Signed)
Pt arrived via EMS with a dx of pneumonia last week. Didn't start antibiotics until today. Reports with increased weakness, SOB, increased AMS, and low 02 sats. Was febrile at 101.4 at facility and was given tylenol there.

## 2018-06-18 NOTE — ED Notes (Signed)
Labs and swab walked to lab. Pt resting.

## 2018-06-19 ENCOUNTER — Encounter (HOSPITAL_COMMUNITY): Payer: Self-pay | Admitting: Internal Medicine

## 2018-06-19 DIAGNOSIS — Z7401 Bed confinement status: Secondary | ICD-10-CM | POA: Diagnosis not present

## 2018-06-19 DIAGNOSIS — U071 COVID-19: Secondary | ICD-10-CM | POA: Diagnosis present

## 2018-06-19 DIAGNOSIS — J1289 Other viral pneumonia: Secondary | ICD-10-CM | POA: Diagnosis present

## 2018-06-19 DIAGNOSIS — J189 Pneumonia, unspecified organism: Secondary | ICD-10-CM | POA: Diagnosis not present

## 2018-06-19 DIAGNOSIS — R5381 Other malaise: Secondary | ICD-10-CM | POA: Diagnosis not present

## 2018-06-19 DIAGNOSIS — I10 Essential (primary) hypertension: Secondary | ICD-10-CM | POA: Diagnosis present

## 2018-06-19 DIAGNOSIS — R1310 Dysphagia, unspecified: Secondary | ICD-10-CM | POA: Diagnosis present

## 2018-06-19 DIAGNOSIS — Z79899 Other long term (current) drug therapy: Secondary | ICD-10-CM | POA: Diagnosis not present

## 2018-06-19 DIAGNOSIS — G309 Alzheimer's disease, unspecified: Secondary | ICD-10-CM

## 2018-06-19 DIAGNOSIS — Z87891 Personal history of nicotine dependence: Secondary | ICD-10-CM | POA: Diagnosis not present

## 2018-06-19 DIAGNOSIS — M2550 Pain in unspecified joint: Secondary | ICD-10-CM | POA: Diagnosis not present

## 2018-06-19 DIAGNOSIS — Z7982 Long term (current) use of aspirin: Secondary | ICD-10-CM | POA: Diagnosis not present

## 2018-06-19 DIAGNOSIS — F0390 Unspecified dementia without behavioral disturbance: Secondary | ICD-10-CM | POA: Diagnosis present

## 2018-06-19 DIAGNOSIS — G9341 Metabolic encephalopathy: Secondary | ICD-10-CM | POA: Diagnosis present

## 2018-06-19 DIAGNOSIS — E785 Hyperlipidemia, unspecified: Secondary | ICD-10-CM | POA: Diagnosis present

## 2018-06-19 DIAGNOSIS — I482 Chronic atrial fibrillation, unspecified: Secondary | ICD-10-CM | POA: Diagnosis present

## 2018-06-19 DIAGNOSIS — E870 Hyperosmolality and hypernatremia: Secondary | ICD-10-CM | POA: Diagnosis present

## 2018-06-19 DIAGNOSIS — I2510 Atherosclerotic heart disease of native coronary artery without angina pectoris: Secondary | ICD-10-CM | POA: Diagnosis present

## 2018-06-19 DIAGNOSIS — D539 Nutritional anemia, unspecified: Secondary | ICD-10-CM | POA: Diagnosis present

## 2018-06-19 DIAGNOSIS — D696 Thrombocytopenia, unspecified: Secondary | ICD-10-CM | POA: Diagnosis present

## 2018-06-19 DIAGNOSIS — R4182 Altered mental status, unspecified: Secondary | ICD-10-CM | POA: Diagnosis not present

## 2018-06-19 DIAGNOSIS — G9340 Encephalopathy, unspecified: Secondary | ICD-10-CM | POA: Diagnosis not present

## 2018-06-19 DIAGNOSIS — Z952 Presence of prosthetic heart valve: Secondary | ICD-10-CM | POA: Diagnosis not present

## 2018-06-19 DIAGNOSIS — F0280 Dementia in other diseases classified elsewhere without behavioral disturbance: Secondary | ICD-10-CM | POA: Diagnosis not present

## 2018-06-19 DIAGNOSIS — R456 Violent behavior: Secondary | ICD-10-CM | POA: Diagnosis not present

## 2018-06-19 DIAGNOSIS — Z66 Do not resuscitate: Secondary | ICD-10-CM | POA: Diagnosis present

## 2018-06-19 DIAGNOSIS — N179 Acute kidney failure, unspecified: Secondary | ICD-10-CM | POA: Diagnosis present

## 2018-06-19 DIAGNOSIS — I248 Other forms of acute ischemic heart disease: Secondary | ICD-10-CM | POA: Diagnosis present

## 2018-06-19 DIAGNOSIS — Z951 Presence of aortocoronary bypass graft: Secondary | ICD-10-CM | POA: Diagnosis not present

## 2018-06-19 DIAGNOSIS — F05 Delirium due to known physiological condition: Secondary | ICD-10-CM | POA: Diagnosis present

## 2018-06-19 DIAGNOSIS — Z8673 Personal history of transient ischemic attack (TIA), and cerebral infarction without residual deficits: Secondary | ICD-10-CM | POA: Diagnosis not present

## 2018-06-19 DIAGNOSIS — R0902 Hypoxemia: Secondary | ICD-10-CM | POA: Diagnosis not present

## 2018-06-19 DIAGNOSIS — I352 Nonrheumatic aortic (valve) stenosis with insufficiency: Secondary | ICD-10-CM | POA: Diagnosis present

## 2018-06-19 DIAGNOSIS — Z9861 Coronary angioplasty status: Secondary | ICD-10-CM | POA: Diagnosis not present

## 2018-06-19 LAB — VITAMIN B12: Vitamin B-12: 801 pg/mL (ref 180–914)

## 2018-06-19 LAB — BASIC METABOLIC PANEL
Anion gap: 7 (ref 5–15)
BUN: 31 mg/dL — ABNORMAL HIGH (ref 8–23)
CO2: 27 mmol/L (ref 22–32)
Calcium: 7.8 mg/dL — ABNORMAL LOW (ref 8.9–10.3)
Chloride: 111 mmol/L (ref 98–111)
Creatinine, Ser: 1.17 mg/dL (ref 0.61–1.24)
GFR calc Af Amer: >60 mL/min (ref >60)
GFR calc non Af Amer: 57 mL/min — ABNORMAL LOW (ref >60)
Glucose, Bld: 123 mg/dL — ABNORMAL HIGH (ref 70–99)
Potassium: 3.8 mmol/L (ref 3.5–5.1)
Sodium: 145 mmol/L (ref 135–145)

## 2018-06-19 LAB — CBC
HCT: 36.3 % — ABNORMAL LOW (ref 39.0–52.0)
Hemoglobin: 11.8 g/dL — ABNORMAL LOW (ref 13.0–17.0)
MCH: 33.7 pg (ref 26.0–34.0)
MCHC: 32.5 g/dL (ref 30.0–36.0)
MCV: 103.7 fL — ABNORMAL HIGH (ref 80.0–100.0)
Platelets: 146 10*3/uL — ABNORMAL LOW (ref 150–400)
RBC: 3.5 MIL/uL — ABNORMAL LOW (ref 4.22–5.81)
RDW: 13.4 % (ref 11.5–15.5)
WBC: 7.6 10*3/uL (ref 4.0–10.5)
nRBC: 0 % (ref 0.0–0.2)

## 2018-06-19 LAB — AMMONIA: Ammonia: 19 umol/L (ref 9–35)

## 2018-06-19 LAB — MRSA PCR SCREENING: MRSA by PCR: NEGATIVE

## 2018-06-19 LAB — GLUCOSE, CAPILLARY
Glucose-Capillary: 102 mg/dL — ABNORMAL HIGH (ref 70–99)
Glucose-Capillary: 139 mg/dL — ABNORMAL HIGH (ref 70–99)
Glucose-Capillary: 108 mg/dL — ABNORMAL HIGH (ref 70–99)

## 2018-06-19 LAB — PROTIME-INR
INR: 1.2 (ref 0.8–1.2)
Prothrombin Time: 15.4 seconds — ABNORMAL HIGH (ref 11.4–15.2)

## 2018-06-19 LAB — RETICULOCYTES
Immature Retic Fract: 15.2 % (ref 2.3–15.9)
RBC.: 3.5 MIL/uL — ABNORMAL LOW (ref 4.22–5.81)
Retic Count, Absolute: 21 10*3/uL (ref 19.0–186.0)
Retic Ct Pct: 0.6 % (ref 0.4–3.1)

## 2018-06-19 LAB — IRON AND TIBC
Iron: 18 ug/dL — ABNORMAL LOW (ref 45–182)
Saturation Ratios: 11 % — ABNORMAL LOW (ref 17.9–39.5)
TIBC: 163 ug/dL — ABNORMAL LOW (ref 250–450)
UIBC: 145 ug/dL

## 2018-06-19 LAB — SARS CORONAVIRUS 2 BY RT PCR (HOSPITAL ORDER, PERFORMED IN ~~LOC~~ HOSPITAL LAB): SARS Coronavirus 2: POSITIVE — AB

## 2018-06-19 LAB — URINE CULTURE: Culture: NO GROWTH

## 2018-06-19 LAB — FOLATE: Folate: 21.6 ng/mL (ref >5.9)

## 2018-06-19 LAB — TROPONIN I: Troponin I: 0.04 ng/mL (ref <0.03)

## 2018-06-19 LAB — FERRITIN: Ferritin: 804 ng/mL — ABNORMAL HIGH (ref 24–336)

## 2018-06-19 LAB — STREP PNEUMONIAE URINARY ANTIGEN: Strep Pneumo Urinary Antigen: NEGATIVE

## 2018-06-19 MED ORDER — ASPIRIN EC 81 MG PO TBEC
81.0000 mg | DELAYED_RELEASE_TABLET | Freq: Every day | ORAL | Status: DC
  Administered 2018-06-19 – 2018-07-06 (×16): 81 mg via ORAL
  Filled 2018-06-19 (×17): qty 1

## 2018-06-19 MED ORDER — DIPHENHYDRAMINE HCL 50 MG/ML IJ SOLN
25.0000 mg | Freq: Once | INTRAMUSCULAR | Status: AC
  Administered 2018-06-19: 25 mg via INTRAVENOUS
  Filled 2018-06-19: qty 1

## 2018-06-19 MED ORDER — SODIUM CHLORIDE 0.9 % IV SOLN
INTRAVENOUS | Status: DC | PRN
  Administered 2018-06-19: 250 mL via INTRAVENOUS

## 2018-06-19 MED ORDER — DEXTROSE-NACL 5-0.9 % IV SOLN
INTRAVENOUS | Status: AC
  Administered 2018-06-19 (×2): via INTRAVENOUS

## 2018-06-19 MED ORDER — DONEPEZIL HCL 10 MG PO TABS
10.0000 mg | ORAL_TABLET | Freq: Every day | ORAL | Status: DC
  Administered 2018-06-19 – 2018-07-05 (×17): 10 mg via ORAL
  Filled 2018-06-19 (×4): qty 1
  Filled 2018-06-19: qty 2
  Filled 2018-06-19 (×14): qty 1

## 2018-06-19 MED ORDER — METHYLPREDNISOLONE SODIUM SUCC 125 MG IJ SOLR
60.0000 mg | Freq: Three times a day (TID) | INTRAMUSCULAR | Status: DC
  Administered 2018-06-19 – 2018-06-21 (×6): 60 mg via INTRAVENOUS
  Filled 2018-06-19 (×5): qty 2

## 2018-06-19 MED ORDER — RISPERIDONE 0.25 MG PO TABS
0.2500 mg | ORAL_TABLET | Freq: Four times a day (QID) | ORAL | Status: DC | PRN
  Administered 2018-06-20 – 2018-07-06 (×9): 0.25 mg via ORAL
  Filled 2018-06-19 (×12): qty 1

## 2018-06-19 MED ORDER — ORAL CARE MOUTH RINSE
15.0000 mL | Freq: Two times a day (BID) | OROMUCOSAL | Status: DC
  Administered 2018-06-23 – 2018-07-06 (×25): 15 mL via OROMUCOSAL

## 2018-06-19 MED ORDER — SODIUM CHLORIDE 0.9 % IV SOLN
1.0000 g | INTRAVENOUS | Status: DC
  Administered 2018-06-19: 1 g via INTRAVENOUS
  Filled 2018-06-19: qty 10
  Filled 2018-06-19: qty 1
  Filled 2018-06-19: qty 10

## 2018-06-19 MED ORDER — ONDANSETRON HCL 4 MG PO TABS
4.0000 mg | ORAL_TABLET | Freq: Four times a day (QID) | ORAL | Status: DC | PRN
  Filled 2018-06-19: qty 1

## 2018-06-19 MED ORDER — ACETAMINOPHEN 325 MG RE SUPP
325.0000 mg | Freq: Once | RECTAL | Status: AC
  Administered 2018-06-19: 325 mg via RECTAL
  Filled 2018-06-19: qty 1

## 2018-06-19 MED ORDER — SODIUM CHLORIDE 0.9 % IV SOLN
INTRAVENOUS | Status: DC | PRN
  Administered 2018-06-19: 21:00:00 250 mL via INTRAVENOUS

## 2018-06-19 MED ORDER — ATORVASTATIN CALCIUM 40 MG PO TABS
40.0000 mg | ORAL_TABLET | Freq: Every day | ORAL | Status: DC
  Administered 2018-06-19 – 2018-07-06 (×16): 40 mg via ORAL
  Filled 2018-06-19 (×17): qty 1

## 2018-06-19 MED ORDER — ACETAMINOPHEN 650 MG RE SUPP: 650.0000 mg | Freq: Four times a day (QID) | RECTAL | Status: DC | PRN

## 2018-06-19 MED ORDER — ACETAMINOPHEN 650 MG RE SUPP
325.0000 mg | Freq: Once | RECTAL | Status: AC
  Administered 2018-06-19: 325 mg via RECTAL
  Filled 2018-06-19: qty 1

## 2018-06-19 MED ORDER — ONDANSETRON HCL 4 MG/2ML IJ SOLN: 4.0000 mg | Freq: Four times a day (QID) | INTRAMUSCULAR | Status: DC | PRN

## 2018-06-19 MED ORDER — MEMANTINE HCL 10 MG PO TABS
10.0000 mg | ORAL_TABLET | Freq: Two times a day (BID) | ORAL | Status: DC
  Administered 2018-06-19 – 2018-07-06 (×33): 10 mg via ORAL
  Filled 2018-06-19 (×38): qty 1

## 2018-06-19 MED ORDER — SODIUM CHLORIDE 0.9 % IV SOLN
500.0000 mg | INTRAVENOUS | Status: DC
  Administered 2018-06-19: 500 mg via INTRAVENOUS
  Filled 2018-06-19 (×2): qty 500

## 2018-06-19 MED ORDER — TOCILIZUMAB 400 MG/20ML IV SOLN
8.0000 mg/kg | Freq: Once | INTRAVENOUS | Status: DC
  Filled 2018-06-19: qty 32.4

## 2018-06-19 MED ORDER — ACETAMINOPHEN 650 MG RE SUPP
650.0000 mg | Freq: Once | RECTAL | Status: AC
  Administered 2018-06-19: 650 mg via RECTAL
  Filled 2018-06-19: qty 1

## 2018-06-19 MED ORDER — ENOXAPARIN SODIUM 40 MG/0.4ML ~~LOC~~ SOLN
40.0000 mg | SUBCUTANEOUS | Status: DC
  Administered 2018-06-19 – 2018-07-06 (×18): 40 mg via SUBCUTANEOUS
  Filled 2018-06-19 (×18): qty 0.4

## 2018-06-19 MED ORDER — METOPROLOL TARTRATE 25 MG PO TABS
12.5000 mg | ORAL_TABLET | Freq: Every day | ORAL | Status: DC
  Administered 2018-06-19 – 2018-07-06 (×15): 12.5 mg via ORAL
  Filled 2018-06-19 (×18): qty 1

## 2018-06-19 MED ORDER — MELATONIN 1 MG PO TABS
1.0000 mg | ORAL_TABLET | Freq: Every day | ORAL | Status: DC
  Administered 2018-06-19 – 2018-07-05 (×15): 1 mg via ORAL
  Filled 2018-06-19 (×19): qty 1

## 2018-06-19 MED ORDER — ACETAMINOPHEN 325 MG PO TABS
650.0000 mg | ORAL_TABLET | Freq: Four times a day (QID) | ORAL | Status: DC | PRN
  Administered 2018-06-19 – 2018-07-05 (×4): 650 mg via ORAL
  Filled 2018-06-19 (×4): qty 2

## 2018-06-19 NOTE — Progress Notes (Signed)
Patient was admitted early morning due to community-acquired pneumonia and acute encephalopathy likely secondary to pneumonia.  He was initially tested negative for COVID-19 but repeat test came back positive.  Patient is going to be transferred to [facility] per policy.

## 2018-06-19 NOTE — Progress Notes (Signed)
Unable to complete admission due to patient is confused/dementia and I called [facility] but unable to speak to anyone with several attempt.

## 2018-06-19 NOTE — Progress Notes (Signed)
Patient's temp still high 102.3 even after some tylenol at midnight and 2am, notified Dr. Hal Hope, one time order given for 325 tyelnol suppository and given to patient. Reported off to next shift nurse to F/U with plan of care.

## 2018-06-19 NOTE — Evaluation (Signed)
SLP Cancellation Note  Patient Details Name: HILMER ALIBERTI MRN: [id number] DOB: 03-Dec-1930   Cancelled treatment:       Reason Eval/Treat Not Completed: Other (comment)(pt transferred to [facility])   Macario Golds 06/19/2018, 12:32 PM   Luanna Salk, [facility] SLP [place] Pager [phone] Office [phone]

## 2018-06-19 NOTE — Progress Notes (Signed)
Marvin Wagner pts daughter [phone] has been updated and clarified Marvin Wagner is the [employer] assigned POA [phone]. They both understand Marvin Wagner will be our point of contact and if time permits we will update Marvin Wagner. Marvin Wagner lives in [place].

## 2018-06-19 NOTE — Progress Notes (Signed)
CRITICAL VALUE ALERT  Critical Value:  SARS2 (+)  Date & Time Notied:  06/19/2018  Provider Notified: PAHWANI  Orders Received/Actions taken:

## 2018-06-19 NOTE — Evaluation (Signed)
Clinical/Bedside Swallow Evaluation Patient Details  Name: Marvin Wagner MRN: [id number] Date of Birth: 1930-10-12  Today's Date: 06/19/2018 Time: SLP Start Time (ACUTE ONLY): 1519 SLP Stop Time (ACUTE ONLY): 1546 SLP Time Calculation (min) (ACUTE ONLY): 27 min  Past Medical History:  Past Medical History:  Diagnosis Date  . Aortic insufficiency and aortic stenosis   . Aortic stenosis    SEVERE  . CAD (coronary artery disease)   . Heart murmur   . Hyperlipidemia   . Hypertension   . Mild memory disturbance    Past Surgical History:  Past Surgical History:  Procedure Laterality Date  . AORTIC VALVE REPLACEMENT  12/17/08  . CARDIAC CATHETERIZATION    . CORONARY ANGIOPLASTY     NORMAL LEFT VENTRICULAR SIZEAND CONTRACTILITY WITH NORMAL SYSTOLIC FUNCTION. EF 65%  . TONSILLECTOMY     HPI:  Pt is an 85 yo male admitted from [facility] with PNA, found to be positive for COVID-19. PMH includes dementia, afib, stroke, HTN, and CAD.   Assessment / Plan / Recommendation Clinical Impression  Pt's mentation has a mild impact on his bolus acceptance and preparation, needing assist from SLP for self-feeding. He has delayed throat clearing regardless of consistency, as well as delayed coughing that appeared volitional in nature that started several minutes after his last bolus had been administered. Despite several effortful, volitional coughs, he had no expectoration. Question if this could be more esophageal in nature, given that his oropharyngeal swallow otherwise appeared to be grossly functional given extra time for mastication. Recommend starting with Dys 3 diet and thin liquids, although with SLP f/u for tolerance warranted.  SLP Visit Diagnosis: Dysphagia, unspecified (R13.10)    Aspiration Risk  Moderate aspiration risk;Mild aspiration risk    Diet Recommendation Dysphagia 3 (Mech soft);Thin liquid   Liquid Administration via: Cup;Straw Medication Administration: Crushed with  puree Supervision: Staff to assist with self feeding;Full supervision/cueing for compensatory strategies Compensations: Slow rate;Small sips/bites;Minimize environmental distractions Postural Changes: Seated upright at 90 degrees;Remain upright for at least 30 minutes after po intake    Other  Recommendations Oral Care Recommendations: Oral care BID   Follow up Recommendations 24 hour supervision/assistance      Frequency and Duration min 2x/week  2 weeks       Prognosis Prognosis for Safe Diet Advancement: Good Barriers to Reach Goals: Cognitive deficits      Swallow Study   General HPI: Pt is an 85 yo male admitted from [facility] with PNA, found to be positive for COVID-19. PMH includes dementia, afib, stroke, HTN, and CAD. Type of Study: Bedside Swallow Evaluation Previous Swallow Assessment: none in chart Diet Prior to this Study: Regular;Thin liquids Temperature Spikes Noted: Yes(102.3) Respiratory Status: Nasal cannula History of Recent Intubation: No Behavior/Cognition: Alert;Cooperative;Requires cueing Oral Cavity Assessment: Dry Oral Care Completed by SLP: No Oral Cavity - Dentition: Adequate natural dentition Vision: Functional for self-feeding Self-Feeding Abilities: Needs assist Patient Positioning: Upright in bed Baseline Vocal Quality: Normal Volitional Swallow: Unable to elicit    Oral/Motor/Sensory Function Overall Oral Motor/Sensory Function: (not following commands well but appears symmetrical)   Ice Chips Ice chips: Impaired Presentation: Spoon Pharyngeal Phase Impairments: Throat Clearing - Delayed   Thin Liquid Thin Liquid: Impaired Presentation: Cup;Self Fed;Spoon;Straw Pharyngeal  Phase Impairments: Throat Clearing - Delayed    Nectar Thick Nectar Thick Liquid: Not tested   Honey Thick Honey Thick Liquid: Not tested   Puree Puree: Impaired Presentation: Spoon Pharyngeal Phase Impairments: Throat Clearing - Delayed  Solid     Solid:  Impaired Pharyngeal Phase Impairments: Throat Clearing - Delayed;Cough - Delayed      Marvin Wagner 06/19/2018,4:55 PM  Pollyann Glen, M.A. [place] Acute Environmental education officer [phone] Office [phone]

## 2018-06-19 NOTE — Plan of Care (Signed)
  Problem: Education: Goal: Knowledge of General Education information will improve Description: Including pain rating scale, medication(s)/side effects and non-pharmacologic comfort measures Outcome: Not Progressing   

## 2018-06-19 NOTE — Progress Notes (Signed)
Pt continues to rest, now  awake sitting in bed. He continues to become very agitated and combative when touched, Temp is decreasing, pt looks comfortable unless stimulated.

## 2018-06-19 NOTE — Progress Notes (Signed)
   06/19/18 1206  MEWS Score  Resp (!) 30  Pulse Rate (!) 107  BP (!) 146/70  Temp (!) 102.7 F (39.3 C)  SpO2 92 %  O2 Device Room Air  MEWS Score  MEWS RR 2  MEWS Pulse 1  MEWS Systolic 0  MEWS LOC 1  MEWS Temp 2  MEWS Score 6  MEWS Score Color Red  MEWS Assessment  Is this an acute change? Yes  MEWS guidelines implemented *See [place]  Provider Notification  Provider Name/Title Dr. Doristine Bosworth  Date Provider Notified 06/19/18  Time Provider Notified 1694  Notification Type Page  Notification Reason Other (Comment) (inform of MEWS, transferring to [facility])  Notified receiving RN at [facility]. Stacey Drain

## 2018-06-19 NOTE — Progress Notes (Signed)
Report called to [place] at [facility]. Marvin Wagner

## 2018-06-19 NOTE — Progress Notes (Addendum)
Pt presently sleeping, monitor attached, continue to observe pt.

## 2018-06-19 NOTE — H&P (Signed)
History and Physical    Marvin Wagner DZH:[id number] DOB: 06/11/1930 DOA: 06/18/2018  PCP: Eulas Post, MD  Patient coming from: Memory care unit.  Chief Complaint: Fever and confusion.  History was obtained by the ER physician from patient's daughter.  I am unable to reach patient's daughter at this time.  Patient has dementia.  HPI: Marvin Wagner is a 85 y.o. male with history of dementia A. fib stroke hypertension and CAD was found to be increasingly confused over the last 1 week had chest x-ray done as per the report on Jun 15, 2018 was showing infiltrates but no antibiotics were started at that time since patient became more febrile and confused yesterday patient was brought to the ER.  Per daughter patient mental status is at the baseline per the discussion with the ER physician.  ED Course: In the ER patient is not oriented.  Moves all extremities CT head is unremarkable.  Chest x-ray shows infiltrates most of the right side concerning for atypical pneumonia.  COVID-19 test was negative.  Patient was febrile with temperature of 101.4 F.  Lab work show sodium of 141 creatinine 1.1 AST 76 ALT 38 lactic acid 1.1 hemoglobin 12.8 platelets 145.  Blood cultures were obtained patient was started on empiric antibiotics for pneumonia and admitted for further work-up.  Review of Systems: As per HPI, rest all negative.   Past Medical History:  Diagnosis Date  . Aortic insufficiency and aortic stenosis   . Aortic stenosis    SEVERE  . CAD (coronary artery disease)   . Heart murmur   . Hyperlipidemia   . Hypertension   . Mild memory disturbance     Past Surgical History:  Procedure Laterality Date  . AORTIC VALVE REPLACEMENT  12/17/08  . CARDIAC CATHETERIZATION    . CORONARY ANGIOPLASTY     NORMAL LEFT VENTRICULAR SIZEAND CONTRACTILITY WITH NORMAL SYSTOLIC FUNCTION. EF 65%  . TONSILLECTOMY       reports that he quit smoking about 11 years ago. He has never used smokeless  tobacco. He reports that he does not drink alcohol or use drugs.  Allergies  Allergen Reactions  . Penicillins Other (See Comments)    Has patient had a PCN reaction causing immediate rash, facial/tongue/throat swelling, SOB or lightheadedness with hypotension: Unknown Has patient had a PCN reaction causing severe rash involving mucus membranes or skin necrosis: Unknown Has patient had a PCN reaction that required hospitalization: Unknown Has patient had a PCN reaction occurring within the last 10 years: Unknown If all of the above answers are "NO", then may proceed with Cephalosporin use.     Family History  Problem Relation Age of Onset  . Ankylosing spondylitis Brother     Prior to Admission medications   Medication Sig Start Date End Date Taking? Authorizing Provider  acetaminophen (TYLENOL) 500 MG tablet Take 500 mg by mouth every 6 (six) hours as needed for mild pain.    [provider]  aspirin EC 81 MG tablet Take 81 mg by mouth daily.    [provider]  atorvastatin (LIPITOR) 40 MG tablet Take 1 tablet (40 mg total) daily by mouth. 12/12/16   Burchette, Alinda Sierras, MD  donepezil (ARICEPT) 10 MG tablet TAKE 1 TABLET (10 MG TOTAL) BY MOUTH AT BEDTIME. 12/28/15   Burchette, Alinda Sierras, MD  ENSURE PLUS (ENSURE PLUS) LIQD Take 237 mLs by mouth daily.    [provider]  Melatonin 1 MG CAPS Take  1 capsule (1 mg total) by mouth at bedtime. 02/24/17   Burchette, Alinda Sierras, MD  memantine (NAMENDA) 10 MG tablet Take 1 tablet (10 mg total) by mouth 2 (two) times daily. TAKE   1 TABLET (10MG ) BY MOUTH TWICE DAILY Patient taking differently: Take 10 mg by mouth 2 (two) times daily.  02/24/17   Burchette, Alinda Sierras, MD  metoprolol tartrate (LOPRESSOR) 25 MG tablet TAKE ONE-HALF TABLETS (12.5 MG TOTAL) BY MOUTH DAILY. Patient taking differently: Take 12.5 mg by mouth daily.  11/11/16   Burchette, Alinda Sierras, MD  Multiple Vitamins-Minerals (CENTRUM SILVER 50+MEN) TABS Take 1  tablet by mouth daily.    [provider]  ondansetron (ZOFRAN ODT) 8 MG disintegrating tablet Take 1 tablet (8 mg total) by mouth every 8 (eight) hours as needed for nausea or vomiting. Patient not taking: Reported on 09/22/2017 09/21/17   Molpus, Jenny Reichmann, MD  ondansetron (ZOFRAN) 8 MG tablet Take 8 mg by mouth every 8 (eight) hours as needed. 09/21/17   [provider]  risperiDONE (RISPERDAL) 0.25 MG tablet Take 1 tablet (0.25 mg total) by mouth 2 (two) times daily. Patient taking differently: Take 0.25 mg by mouth every 6 (six) hours as needed. Anxiety 02/24/17   Burchette, Alinda Sierras, MD  triamcinolone cream (KENALOG) 0.1 % Apply 1 application topically 2 (two) times daily as needed (rash). Spread topically to face and hand rash twice a day as needed until resolved    [provider]  warfarin (COUMADIN) 5 MG tablet TAKE AS DIRECTED BY ANTICOAGULATION CLINIC Patient not taking: No sig reported 08/26/16   Eulas Post, MD    Physical Exam: Vitals:   06/18/18 2115 06/18/18 2200 06/18/18 2300 06/19/18 0000  BP: 119/69 119/69 120/67 123/81  Pulse: 83 91 73 95  Resp: 19 15 19 19   Temp:    (!) 101.4 F (38.6 C)  TempSrc:    Axillary  SpO2: 100% 100% 100% 100%  Weight:      Height:          Constitutional: Moderately built and nourished. Vitals:   06/18/18 2115 06/18/18 2200 06/18/18 2300 06/19/18 0000  BP: 119/69 119/69 120/67 123/81  Pulse: 83 91 73 95  Resp: 19 15 19 19   Temp:    (!) 101.4 F (38.6 C)  TempSrc:    Axillary  SpO2: 100% 100% 100% 100%  Weight:      Height:       Eyes: Anicteric no pallor. ENMT: No discharge from the ears eyes nose or mouth. Neck: No mass or.  No neck rigidity. Respiratory: No rhonchi or crepitations. Cardiovascular: S1-S2 heard. Abdomen: Soft nontender bowel sounds present. Musculoskeletal: No edema.  No joint effusion. Skin: No rash. Neurologic: Patient is alert awake but does not follow commands appears confused.  Psychiatric: Appears confused.   Labs on Admission: I have personally reviewed following labs and imaging studies  CBC: Recent Labs  Lab 06/18/18 2101  WBC 8.7  NEUTROABS 7.0  HGB 12.3*  HCT 37.0*  MCV 101.1*  PLT 272*   Basic Metabolic Panel: Recent Labs  Lab 06/18/18 2101  NA 141  K 3.8  CL 109  CO2 23  GLUCOSE 118*  BUN 36*  CREATININE 1.16  CALCIUM 7.6*   GFR: Estimated Creatinine Clearance: 51.1 mL/min (by C-G formula based on SCr of 1.16 mg/dL). Liver Function Tests: Recent Labs  Lab 06/18/18 2101  AST 76*  ALT 38  ALKPHOS 65  BILITOT 0.9  PROT  6.5  ALBUMIN 2.9*   No results for input(s): LIPASE, AMYLASE in the last 168 hours. No results for input(s): AMMONIA in the last 168 hours. Coagulation Profile: No results for input(s): INR, PROTIME in the last 168 hours. Cardiac Enzymes: Recent Labs  Lab 06/18/18 2101  TROPONINI 0.04*   BNP (last 3 results) No results for input(s): PROBNP in the last 8760 hours. HbA1C: No results for input(s): HGBA1C in the last 72 hours. CBG: No results for input(s): GLUCAP in the last 168 hours. Lipid Profile: No results for input(s): CHOL, HDL, LDLCALC, TRIG, CHOLHDL, LDLDIRECT in the last 72 hours. Thyroid Function Tests: No results for input(s): TSH, T4TOTAL, FREET4, T3FREE, THYROIDAB in the last 72 hours. Anemia Panel: No results for input(s): VITAMINB12, FOLATE, FERRITIN, TIBC, IRON, RETICCTPCT in the last 72 hours. Urine analysis:    Component Value Date/Time   COLORURINE AMBER (A) 06/18/2018 2048   APPEARANCEUR CLOUDY (A) 06/18/2018 2048   LABSPEC 1.031 (H) 06/18/2018 2048   PHURINE 5.0 06/18/2018 2048   GLUCOSEU NEGATIVE 06/18/2018 2048   HGBUR SMALL (A) 06/18/2018 2048   BILIRUBINUR NEGATIVE 06/18/2018 2048   BILIRUBINUR neg 02/08/2017 1149   KETONESUR NEGATIVE 06/18/2018 2048   PROTEINUR 100 (A) 06/18/2018 2048   UROBILINOGEN 0.2 02/08/2017 1149   UROBILINOGEN 0.2 12/15/2008 1405   NITRITE  NEGATIVE 06/18/2018 2048   LEUKOCYTESUR NEGATIVE 06/18/2018 2048   Sepsis Labs: @LABRCNTIP (procalcitonin:4,lacticidven:4) ) Recent Results (from the past 240 hour(s))  SARS Coronavirus 2 (CEPHEID- Performed in [place] hospital lab), Hosp Order     Status: None   Collection Time: 06/18/18  8:53 PM  Result Value Ref Range Status   SARS Coronavirus 2 NEGATIVE NEGATIVE Final    Comment: (NOTE) If result is NEGATIVE SARS-CoV-2 target nucleic acids are NOT DETECTED. The SARS-CoV-2 RNA is generally detectable in upper and lower  respiratory specimens during the acute phase of infection. The lowest  concentration of SARS-CoV-2 viral copies this assay can detect is 250  copies / mL. A negative result does not preclude SARS-CoV-2 infection  and should not be used as the sole basis for treatment or other  patient management decisions.  A negative result may occur with  improper specimen collection / handling, submission of specimen other  than nasopharyngeal swab, presence of viral mutation(s) within the  areas targeted by this assay, and inadequate number of viral copies  (<250 copies / mL). A negative result must be combined with clinical  observations, patient history, and epidemiological information. If result is POSITIVE SARS-CoV-2 target nucleic acids are DETECTED. The SARS-CoV-2 RNA is generally detectable in upper and lower  respiratory specimens dur ing the acute phase of infection.  Positive  results are indicative of active infection with SARS-CoV-2.  Clinical  correlation with patient history and other diagnostic information is  necessary to determine patient infection status.  Positive results do  not rule out bacterial infection or co-infection with other viruses. If result is PRESUMPTIVE POSTIVE SARS-CoV-2 nucleic acids MAY BE PRESENT.   A presumptive positive result was obtained on the submitted specimen  and confirmed on repeat testing.  While 2019 novel coronavirus   (SARS-CoV-2) nucleic acids may be present in the submitted sample  additional confirmatory testing may be necessary for epidemiological  and / or clinical management purposes  to differentiate between  SARS-CoV-2 and other Sarbecovirus currently known to infect humans.  If clinically indicated additional testing with an alternate test  methodology [phone]) is advised. The SARS-CoV-2 RNA is  generally  detectable in upper and lower respiratory sp ecimens during the acute  phase of infection. The expected result is Negative. Fact Sheet for Patients:  StrictlyIdeas.no Fact Sheet for Healthcare Providers: BankingDealers.co.za This test is not yet approved or cleared by the [place] FDA and has been authorized for detection and/or diagnosis of SARS-CoV-2 by FDA under an Emergency Use Authorization (EUA).  This EUA will remain in effect (meaning this test can be used) for the duration of the COVID-19 declaration under Section 564(b)(1) of the Act, 21 U.S.C. section 360bbb-3(b)(1), unless the authorization is terminated or revoked sooner. Performed at [facility], [place] [address] [place], [place] [address]      Radiological Exams on Admission: Dg Chest Portable 1 View  Result Date: 06/18/2018 CLINICAL DATA:  85 year old male with recent pneumonia. Fever and shortness of breath. COVID-19 status is pending. EXAM: PORTABLE CHEST 1 VIEW COMPARISON:  01/14/2017 and earlier. FINDINGS: Portable AP semi upright view at 2042 hours. Ill-defined abnormal right upper lobe opacity near the hilum is new since 2018. there is also patchy confluent opacity at the right lateral costophrenic angle. There is mild superimposed asymmetric indistinct and interstitial opacity in both lungs. Chronic prosthetic cardiac valve. Stable cardiac size and mediastinal contours. Calcified aortic atherosclerosis. Visualized tracheal air column is within  normal limits. No pneumothorax or pleural effusion. Negative visible bowel gas pattern. No acute osseous abnormality identified. IMPRESSION: Abnormal right greater than left pulmonary opacity most confluent in the right upper lobe opacity. Consider acute pneumonia versus viral/atypical respiratory infection. No pleural effusion. Electronically Signed   By: Genevie Ann M.D.   On: 06/18/2018 21:43    EKG: Independently reviewed.  Normal sinus rhythm.  Assessment/Plan Principal Problem:   Community acquired pneumonia Active Problems:   Hx of CABG   Dementia (HCC)   Acute encephalopathy   CAP (community acquired pneumonia)    1. Pneumonia -patient is placed on empiric antibiotics.  Since patient remains persistently febrile will repeat COVID-19 test again along with respiratory viral panel.  Per the ER notes patient failed swallow for which we will get a speech therapist evaluation. 2. Acute encephalopathy likely from fever and pneumonia which as per the daughter his back to baseline.  CT head unremarkable. 3. Acute renal failure -creatinine has increased from baseline when compared to August 2019.  In August 2019 creatinine was 0.8 is now 1.1.  Will gently hydrate.  Follow metabolic panel. 4. Mildly elevated LFTs appears to be chronic.  Follow LFTs.  Check ammonia. 5. History of A. fib presently not on anticoagulation per the records.  Unable to reach daughter.  Presently on rate limiting medication metoprolol.  May discuss with daughter again in the morning to see if this has been held. 6. Macrocytic anemia -follow CBC.  Check anemia panel. 7. Thrombocytopenia appears to be chronic.  Follow CBC. 8. History of CAD per the chart.   DVT prophylaxis: Lovenox. Code Status: DNR as confirmed by patient's daughter to the ER physician. Family Communication: ER physician discussed with patient's daughter. Disposition Plan: Back to memory care when stable. Consults called: Speech therapy. Admission  status: Observation.   Rise Patience MD [facility] Pager [phone].  If 7PM-7AM, please contact night-coverage [email or web address] Password [facility]  06/19/2018, 12:13 AM

## 2018-06-19 NOTE — ED Notes (Signed)
ED TO INPATIENT HANDOFF REPORT  ED Nurse Name and Phone #: Tomi Likens, RN  S Name/Age/Gender Marvin Wagner 85 y.o. male Room/Bed: WA15/WA15  Code Status   Code Status: DNR  Home/SNF/Other Skilled nursing facility Is this baseline? Yes   Triage Complete: Triage complete  Chief Complaint PNA; ShOB  Triage Note Pt arrived via EMS with a dx of pneumonia last week. Didn't start antibiotics until today. Reports with increased weakness, SOB, increased AMS, and low 02 sats. Was febrile at 101.4 at facility and was given tylenol there.    Allergies Allergies  Allergen Reactions  . Penicillins Other (See Comments)    Has patient had a PCN reaction causing immediate rash, facial/tongue/throat swelling, SOB or lightheadedness with hypotension: Unknown Has patient had a PCN reaction causing severe rash involving mucus membranes or skin necrosis: Unknown Has patient had a PCN reaction that required hospitalization: Unknown Has patient had a PCN reaction occurring within the last 10 years: Unknown If all of the above answers are "NO", then may proceed with Cephalosporin use.     Level of Care/Admitting Diagnosis ED Disposition    ED Disposition Condition Comment   Admit  Hospital Area: [facility] [[id number]]  Level of Care: Telemetry [5]  Admit to tele based on following criteria: Monitor QTC interval  Covid Evaluation: N/A  Diagnosis: CAP (community acquired pneumonia) [989211]  Admitting Physician: Rise Patience [phone]  Attending Physician: Rise Patience Lei.Right  PT Class (Do Not Modify): Observation [104]  PT Acc Code (Do Not Modify): Observation [10022]       B Medical/Surgery History Past Medical History:  Diagnosis Date  . Aortic insufficiency and aortic stenosis   . Aortic stenosis    SEVERE  . CAD (coronary artery disease)   . Heart murmur   . Hyperlipidemia   . Hypertension   . Mild memory disturbance    Past Surgical History:   Procedure Laterality Date  . AORTIC VALVE REPLACEMENT  12/17/08  . CARDIAC CATHETERIZATION    . CORONARY ANGIOPLASTY     NORMAL LEFT VENTRICULAR SIZEAND CONTRACTILITY WITH NORMAL SYSTOLIC FUNCTION. EF 65%  . TONSILLECTOMY       A IV Location/Drains/Wounds Patient Lines/Drains/Airways Status   Active Line/Drains/Airways    Name:   Placement date:   Placement time:   Site:   Days:   Peripheral IV 06/18/18 Right Forearm   06/18/18    2030    Forearm   1   Peripheral IV 06/18/18 Left Forearm   06/18/18    2035    Forearm   1          Intake/Output Last 24 hours No intake or output data in the 24 hours ending 06/19/18 0203  Labs/Imaging Results for orders placed or performed during the hospital encounter of 06/18/18 (from the past 48 hour(s))  Urinalysis, Routine w reflex microscopic     Status: Abnormal   Collection Time: 06/18/18  8:48 PM  Result Value Ref Range   Color, Urine AMBER (A) YELLOW    Comment: BIOCHEMICALS MAY BE AFFECTED BY COLOR   APPearance CLOUDY (A) CLEAR   Specific Gravity, Urine 1.031 (H) 1.005 - 1.030   pH 5.0 5.0 - 8.0   Glucose, UA NEGATIVE NEGATIVE mg/dL   Hgb urine dipstick SMALL (A) NEGATIVE   Bilirubin Urine NEGATIVE NEGATIVE   Ketones, ur NEGATIVE NEGATIVE mg/dL   Protein, ur 100 (A) NEGATIVE mg/dL   Nitrite NEGATIVE NEGATIVE   Leukocytes,Ua NEGATIVE  NEGATIVE   RBC / HPF 11-20 0 - 5 RBC/hpf   WBC, UA 0-5 0 - 5 WBC/hpf   Bacteria, UA RARE (A) NONE SEEN   Squamous Epithelial / LPF 0-5 0 - 5   Mucus PRESENT    Hyaline Casts, UA PRESENT    Amorphous Crystal PRESENT     Comment: Performed at [facility], [place] [address] [place], [place] [address]  SARS Coronavirus 2 (CEPHEID- Performed in [place] hospital lab), Hosp Order     Status: None   Collection Time: 06/18/18  8:53 PM  Result Value Ref Range   SARS Coronavirus 2 NEGATIVE NEGATIVE    Comment: (NOTE) If result is NEGATIVE SARS-CoV-2 target nucleic acids are NOT  DETECTED. The SARS-CoV-2 RNA is generally detectable in upper and lower  respiratory specimens during the acute phase of infection. The lowest  concentration of SARS-CoV-2 viral copies this assay can detect is 250  copies / mL. A negative result does not preclude SARS-CoV-2 infection  and should not be used as the sole basis for treatment or other  patient management decisions.  A negative result may occur with  improper specimen collection / handling, submission of specimen other  than nasopharyngeal swab, presence of viral mutation(s) within the  areas targeted by this assay, and inadequate number of viral copies  (<250 copies / mL). A negative result must be combined with clinical  observations, patient history, and epidemiological information. If result is POSITIVE SARS-CoV-2 target nucleic acids are DETECTED. The SARS-CoV-2 RNA is generally detectable in upper and lower  respiratory specimens dur ing the acute phase of infection.  Positive  results are indicative of active infection with SARS-CoV-2.  Clinical  correlation with patient history and other diagnostic information is  necessary to determine patient infection status.  Positive results do  not rule out bacterial infection or co-infection with other viruses. If result is PRESUMPTIVE POSTIVE SARS-CoV-2 nucleic acids MAY BE PRESENT.   A presumptive positive result was obtained on the submitted specimen  and confirmed on repeat testing.  While 2019 novel coronavirus  (SARS-CoV-2) nucleic acids may be present in the submitted sample  additional confirmatory testing may be necessary for epidemiological  and / or clinical management purposes  to differentiate between  SARS-CoV-2 and other Sarbecovirus currently known to infect humans.  If clinically indicated additional testing with an alternate test  methodology [phone]) is advised. The SARS-CoV-2 RNA is generally  detectable in upper and lower respiratory sp ecimens during  the acute  phase of infection. The expected result is Negative. Fact Sheet for Patients:  StrictlyIdeas.no Fact Sheet for Healthcare Providers: BankingDealers.co.za This test is not yet approved or cleared by the [place] FDA and has been authorized for detection and/or diagnosis of SARS-CoV-2 by FDA under an Emergency Use Authorization (EUA).  This EUA will remain in effect (meaning this test can be used) for the duration of the COVID-19 declaration under Section 564(b)(1) of the Act, 21 U.S.C. section 360bbb-3(b)(1), unless the authorization is terminated or revoked sooner. Performed at [facility], [place] [address] [place], [place] [address]   CBC with Differential     Status: Abnormal   Collection Time: 06/18/18  9:01 PM  Result Value Ref Range   WBC 8.7 4.0 - 10.5 K/uL   RBC 3.66 (L) 4.22 - 5.81 MIL/uL   Hemoglobin 12.3 (L) 13.0 - 17.0 g/dL   HCT 37.0 (L) 39.0 - 52.0 %   MCV 101.1 (  H) 80.0 - 100.0 fL   MCH 33.6 26.0 - 34.0 pg   MCHC 33.2 30.0 - 36.0 g/dL   RDW 13.3 11.5 - 15.5 %   Platelets 145 (L) 150 - 400 K/uL   nRBC 0.0 0.0 - 0.2 %   Neutrophils Relative % 81 %   Neutro Abs 7.0 1.7 - 7.7 K/uL   Lymphocytes Relative 12 %   Lymphs Abs 1.0 0.7 - 4.0 K/uL   Monocytes Relative 6 %   Monocytes Absolute 0.5 0.1 - 1.0 K/uL   Eosinophils Relative 0 %   Eosinophils Absolute 0.0 0.0 - 0.5 K/uL   Basophils Relative 0 %   Basophils Absolute 0.0 0.0 - 0.1 K/uL   Immature Granulocytes 1 %   Abs Immature Granulocytes 0.07 0.00 - 0.07 K/uL    Comment: Performed at [facility], [place] [address] [place], [place] [address]  Lactic acid, plasma     Status: None   Collection Time: 06/18/18  9:01 PM  Result Value Ref Range   Lactic Acid, Venous 1.1 0.5 - 1.9 mmol/L    Comment: Performed at [facility], [place] [address], [place], [place] [address]  Comprehensive metabolic panel      Status: Abnormal   Collection Time: 06/18/18  9:01 PM  Result Value Ref Range   Sodium 141 135 - 145 mmol/L   Potassium 3.8 3.5 - 5.1 mmol/L   Chloride 109 98 - 111 mmol/L   CO2 23 22 - 32 mmol/L   Glucose, Bld 118 (H) 70 - 99 mg/dL   BUN 36 (H) 8 - 23 mg/dL   Creatinine, Ser 1.16 0.61 - 1.24 mg/dL   Calcium 7.6 (L) 8.9 - 10.3 mg/dL   Total Protein 6.5 6.5 - 8.1 g/dL   Albumin 2.9 (L) 3.5 - 5.0 g/dL   AST 76 (H) 15 - 41 U/L   ALT 38 0 - 44 U/L   Alkaline Phosphatase 65 38 - 126 U/L   Total Bilirubin 0.9 0.3 - 1.2 mg/dL   GFR calc non Af Amer 57 (L) >60 mL/min   GFR calc Af Amer >60 >60 mL/min   Anion gap 9 5 - 15    Comment: Performed at [facility], [place] [address], [place], [place] [address]  Troponin I - ONCE - STAT     Status: Abnormal   Collection Time: 06/18/18  9:01 PM  Result Value Ref Range   Troponin I 0.04 (HH) <0.03 ng/mL    Comment: CRITICAL RESULT CALLED TO, READ BACK BY AND VERIFIED WITH: RN Loletha Grayer HODGES AT 2217 06/18/18 CRUICKSHANK A Performed at [facility], [place] [address] [place], [place] [address]    Ct Head Wo Contrast  Result Date: 06/19/2018 CLINICAL DATA:  85 year old male with shortness of breath weakness, altered mental status. Fever of 101.4 F. EXAM: CT HEAD WITHOUT CONTRAST TECHNIQUE: Contiguous axial images were obtained from the base of the skull through the vertex without intravenous contrast. COMPARISON:  Head CT 01/14/2017, MRI 12/26/2016 and earlier. FINDINGS: Brain: Stable cerebral volume. Stable ventricle size and configuration. No midline shift, mass effect, or evidence of intracranial mass lesion. No acute intracranial hemorrhage identified. Stable gray-white matter differentiation throughout the brain. No cortically based acute infarct identified. Vascular: Calcified atherosclerosis at the skull base. No suspicious intracranial vascular hyperdensity. Skull: No acute osseous abnormality identified.  Sinuses/Orbits: Visualized paranasal sinuses and mastoids are stable and well pneumatized. Other: Stable orbit and scalp soft  tissues. IMPRESSION: Stable non contrast CT appearance of the brain since 2018. No acute intracranial abnormality. Electronically Signed   By: Genevie Ann M.D.   On: 06/19/2018 00:24   Dg Chest Portable 1 View  Result Date: 06/18/2018 CLINICAL DATA:  85 year old male with recent pneumonia. Fever and shortness of breath. COVID-19 status is pending. EXAM: PORTABLE CHEST 1 VIEW COMPARISON:  01/14/2017 and earlier. FINDINGS: Portable AP semi upright view at 2042 hours. Ill-defined abnormal right upper lobe opacity near the hilum is new since 2018. there is also patchy confluent opacity at the right lateral costophrenic angle. There is mild superimposed asymmetric indistinct and interstitial opacity in both lungs. Chronic prosthetic cardiac valve. Stable cardiac size and mediastinal contours. Calcified aortic atherosclerosis. Visualized tracheal air column is within normal limits. No pneumothorax or pleural effusion. Negative visible bowel gas pattern. No acute osseous abnormality identified. IMPRESSION: Abnormal right greater than left pulmonary opacity most confluent in the right upper lobe opacity. Consider acute pneumonia versus viral/atypical respiratory infection. No pleural effusion. Electronically Signed   By: Genevie Ann M.D.   On: 06/18/2018 21:43    Pending Labs Unresulted Labs (From admission, onward)    Start     Ordered   06/26/18 0500  Creatinine, serum  (enoxaparin (LOVENOX)    CrCl >/= 30 ml/min)  Weekly,   R    Comments:  while on enoxaparin therapy    06/19/18 0012   06/19/18 5427  Basic metabolic panel  Tomorrow morning,   R     06/19/18 0012   06/19/18 0500  CBC  Tomorrow morning,   R     06/19/18 0012   06/19/18 0014  HIV antibody (Routine Screening)  Once,   R     06/19/18 0014   06/19/18 0014  Culture, sputum-assessment  Once,   R     06/19/18 0014   06/19/18  0014  Gram stain  Once,   R     06/19/18 0014   06/19/18 0014  Strep pneumoniae urinary antigen  Once,   R     06/19/18 0014   06/19/18 0014  Legionella Pneumophila Serogp 1 Ur Ag  Once,   R     06/19/18 0014   06/19/18 0011  CBC  (enoxaparin (LOVENOX)    CrCl >/= 30 ml/min)  Once,   R    Comments:  Baseline for enoxaparin therapy IF NOT ALREADY DRAWN.  Notify MD if PLT < 100 K.    06/19/18 0012   06/19/18 0011  Creatinine, serum  (enoxaparin (LOVENOX)    CrCl >/= 30 ml/min)  Once,   R    Comments:  Baseline for enoxaparin therapy IF NOT ALREADY DRAWN.    06/19/18 0012   06/18/18 2048  Blood Culture (routine x 2)  BLOOD CULTURE X 2,   STAT     06/18/18 2048   06/18/18 2048  Urine culture  ONCE - STAT,   STAT     06/18/18 2048          Vitals/Pain Today's Vitals   06/19/18 0000 06/19/18 0030 06/19/18 0100 06/19/18 0130  BP: 123/81 135/79 131/72 112/65  Pulse: 95 (!) 103 (!) 101 94  Resp: 19 (!) 25 (!) 22 (!) 22  Temp: (!) 101.4 F (38.6 C)   (!) 102 F (38.9 C)  TempSrc: Axillary   Axillary  SpO2: 100% 98% 100% 100%  Weight:      Height:        Isolation  Precautions No active isolations  Medications Medications  aspirin EC tablet 81 mg (has no administration in time range)  atorvastatin (LIPITOR) tablet 40 mg (has no administration in time range)  metoprolol tartrate (LOPRESSOR) tablet 12.5 mg (has no administration in time range)  donepezil (ARICEPT) tablet 10 mg (has no administration in time range)  memantine (NAMENDA) tablet 10 mg (has no administration in time range)  risperiDONE (RISPERDAL) tablet 0.25 mg (has no administration in time range)  Melatonin CAPS 1 mg (has no administration in time range)  acetaminophen (TYLENOL) tablet 650 mg (has no administration in time range)    Or  acetaminophen (TYLENOL) suppository 650 mg (has no administration in time range)  ondansetron (ZOFRAN) tablet 4 mg (has no administration in time range)    Or  ondansetron  (ZOFRAN) injection 4 mg (has no administration in time range)  enoxaparin (LOVENOX) injection 40 mg (has no administration in time range)  cefTRIAXone (ROCEPHIN) 1 g in sodium chloride 0.9 % 100 mL IVPB (has no administration in time range)  azithromycin (ZITHROMAX) 500 mg in sodium chloride 0.9 % 250 mL IVPB (has no administration in time range)  acetaminophen (TYLENOL) suppository 325 mg (has no administration in time range)  cefTRIAXone (ROCEPHIN) 1 g in sodium chloride 0.9 % 100 mL IVPB (0 g Intravenous Stopped 06/19/18 0006)  azithromycin (ZITHROMAX) 500 mg in sodium chloride 0.9 % 250 mL IVPB (0 mg Intravenous Stopped 06/19/18 0006)  acetaminophen (TYLENOL) suppository 650 mg (650 mg Rectal Given 06/19/18 0030)  diphenhydrAMINE (BENADRYL) injection 25 mg (25 mg Intravenous Given 06/19/18 0141)    Mobility non-ambulatory High fall risk   Focused Assessments Pulmonary Assessment Handoff:  Lung sounds: L Breath Sounds: Diminished R Breath Sounds: Diminished O2 Device: Nasal Cannula O2 Flow Rate (L/min): 2 L/min      R Recommendations: See Admitting Provider Note  Report given to:   Additional Notes:

## 2018-06-19 NOTE — TOC Initial Note (Signed)
Transition of Care [facility]) - Initial/Assessment Note    Patient Details  Name: Marvin Wagner MRN: [id number] Date of Birth: October 11, 1930  Transition of Care [facility]) CM/SW Contact:    Purcell Mouton, RN Phone Number: 06/19/2018, 11:23 AM  Clinical Narrative:                 Pt admitted with CPNA with COVID.   Expected Discharge Plan: Assisted Living(Memory Care) Barriers to Discharge: Other (comment)(from [facility])   Patient Goals and CMS Choice        Expected Discharge Plan and Services Expected Discharge Plan: Assisted Living(Memory Care)       Living arrangements for the past 2 months: [place] Expected Discharge Date: (unknown)                                    Prior Living Arrangements/Services Living arrangements for the past 2 months: [place] Lives with:: Facility Resident Patient language and need for interpreter reviewed:: No                 Activities of Daily Living Home Assistive Devices/Equipment: Grab bars around toilet, Grab bars in shower, Hand-held shower hose, Walker (specify type), Eyeglasses(front wheeled walker-[facility] [employer] [facility] has necessary equipment for their patients) ADL Screening (condition at time of admission) Patient's cognitive ability adequate to safely complete daily activities?: No Is the patient deaf or have difficulty hearing?: Yes Does the patient have difficulty seeing, even when wearing glasses/contacts?: No Does the patient have difficulty concentrating, remembering, or making decisions?: Yes Patient able to express need for assistance with ADLs?: Yes Does the patient have difficulty dressing or bathing?: Yes Independently performs ADLs?: No Communication: Independent Dressing (OT): Dependent Is this a change from baseline?: Change from baseline, expected to last >3 days Grooming: Dependent Is this a change from baseline?: Change from baseline,  expected to last >3 days Feeding: Dependent Is this a change from baseline?: Change from baseline, expected to last >3 days Bathing: Dependent Is this a change from baseline?: Change from baseline, expected to last >3 days Toileting: Dependent Is this a change from baseline?: Change from baseline, expected to last >3days In/Out Bed: Dependent Is this a change from baseline?: Change from baseline, expected to last >3 days Walks in Home: Dependent Is this a change from baseline?: Change from baseline, expected to last >3 days Does the patient have difficulty walking or climbing stairs?: Yes(secondary to weakness) Weakness of Legs: Both Weakness of Arms/Hands: Both  Permission Sought/Granted                  Emotional Assessment Appearance:: Appears stated age            Admission diagnosis:  Hypoxia [R09.02] Multifocal pneumonia [J18.9] Patient Active Problem List   Diagnosis Date Noted  . Community acquired pneumonia 06/19/2018  . Acute encephalopathy 06/19/2018  . CAP (community acquired pneumonia) 06/19/2018  . Acute blood loss anemia 02/27/2016  . UTI (urinary tract infection) 02/27/2016  . Pressure injury of skin 02/25/2016  . Hematoma of chest wall, left, initial encounter 02/24/2016  . Fever 02/24/2016  . Supratherapeutic INR 02/24/2016  . Dementia ([place]) 03/04/2015  . Hypotension 12/03/2014  . Weight loss, unintentional 10/17/2013  . History of elevated PSA 04/25/2013  . Encounter for therapeutic drug monitoring 02/26/2013  . Long term current use of anticoagulant therapy 12/19/2012  .  Other vitamin B12 deficiency anemia 02/29/2012  . Memory disorder 02/14/2012  . Hx of CABG 07/01/2010  . Hyperlipidemia 07/01/2010  . Benign hypertensive heart disease without heart failure 07/01/2010   PCP:  Eulas Post, MD Pharmacy:   [facility], [place] [address] [place] [address] Phone:  [phone] Fax: [phone]  [facility] [address] [facility] [address] AT [place] [address] Phone: [phone] Fax: [place], [place] [address] [place] [address] Phone: [phone] Fax: [phone]  [facility] [address] [place] - [address] [place] [address] Phone: [phone] Fax: [phone]     Social Determinants of Health (SDOH) Interventions    Readmission Risk Interventions No flowsheet data found.

## 2018-06-20 LAB — CBC WITH DIFFERENTIAL/PLATELET
Abs Immature Granulocytes: 0.03 10*3/uL (ref 0.00–0.07)
Basophils Absolute: 0 10*3/uL (ref 0.0–0.1)
Basophils Relative: 0 %
Eosinophils Absolute: 0 10*3/uL (ref 0.0–0.5)
Eosinophils Relative: 0 %
HCT: 32.3 % — ABNORMAL LOW (ref 39.0–52.0)
Hemoglobin: 10.3 g/dL — ABNORMAL LOW (ref 13.0–17.0)
Immature Granulocytes: 1 %
Lymphocytes Relative: 12 %
Lymphs Abs: 0.7 10*3/uL (ref 0.7–4.0)
MCH: 32.3 pg (ref 26.0–34.0)
MCHC: 31.9 g/dL (ref 30.0–36.0)
MCV: 101.3 fL — ABNORMAL HIGH (ref 80.0–100.0)
Monocytes Absolute: 0.2 10*3/uL (ref 0.1–1.0)
Monocytes Relative: 4 %
Neutro Abs: 5 10*3/uL (ref 1.7–7.7)
Neutrophils Relative %: 83 %
Platelets: 155 10*3/uL (ref 150–400)
RBC: 3.19 MIL/uL — ABNORMAL LOW (ref 4.22–5.81)
RDW: 13.3 % (ref 11.5–15.5)
WBC: 6 10*3/uL (ref 4.0–10.5)
nRBC: 0 % (ref 0.0–0.2)

## 2018-06-20 LAB — COMPREHENSIVE METABOLIC PANEL
ALT: 41 U/L (ref 0–44)
AST: 65 U/L — ABNORMAL HIGH (ref 15–41)
Albumin: 2.5 g/dL — ABNORMAL LOW (ref 3.5–5.0)
Alkaline Phosphatase: 54 U/L (ref 38–126)
Anion gap: 7 (ref 5–15)
BUN: 29 mg/dL — ABNORMAL HIGH (ref 8–23)
CO2: 24 mmol/L (ref 22–32)
Calcium: 7.4 mg/dL — ABNORMAL LOW (ref 8.9–10.3)
Chloride: 114 mmol/L — ABNORMAL HIGH (ref 98–111)
Creatinine, Ser: 1.05 mg/dL (ref 0.61–1.24)
GFR calc Af Amer: >60 mL/min (ref >60)
GFR calc non Af Amer: >60 mL/min (ref >60)
Glucose, Bld: 190 mg/dL — ABNORMAL HIGH (ref 70–99)
Potassium: 4 mmol/L (ref 3.5–5.1)
Sodium: 145 mmol/L (ref 135–145)
Total Bilirubin: 0.6 mg/dL (ref 0.3–1.2)
Total Protein: 5.9 g/dL — ABNORMAL LOW (ref 6.5–8.1)

## 2018-06-20 LAB — GLUCOSE, CAPILLARY
Glucose-Capillary: 160 mg/dL — ABNORMAL HIGH (ref 70–99)
Glucose-Capillary: 182 mg/dL — ABNORMAL HIGH (ref 70–99)
Glucose-Capillary: 181 mg/dL — ABNORMAL HIGH (ref 70–99)
Glucose-Capillary: 141 mg/dL — ABNORMAL HIGH (ref 70–99)
Glucose-Capillary: 149 mg/dL — ABNORMAL HIGH (ref 70–99)

## 2018-06-20 LAB — PROCALCITONIN: Procalcitonin: <0.1 ng/mL

## 2018-06-20 LAB — D-DIMER, QUANTITATIVE: D-Dimer, Quant: 1.97 ug/mL-FEU — ABNORMAL HIGH (ref 0.00–0.50)

## 2018-06-20 LAB — HIV ANTIBODY (ROUTINE TESTING W REFLEX): HIV Screen 4th Generation wRfx: NONREACTIVE

## 2018-06-20 LAB — LACTATE DEHYDROGENASE: LDH: 396 U/L — ABNORMAL HIGH (ref 98–192)

## 2018-06-20 LAB — TROPONIN I: Troponin I: 0.03 ng/mL (ref <0.03)

## 2018-06-20 LAB — ABO/RH: ABO/RH(D): O POS

## 2018-06-20 LAB — C-REACTIVE PROTEIN: CRP: 16.3 mg/dL — ABNORMAL HIGH (ref <1.0)

## 2018-06-20 LAB — FERRITIN: Ferritin: 1082 ng/mL — ABNORMAL HIGH (ref 24–336)

## 2018-06-20 MED ORDER — TOCILIZUMAB 400 MG/20ML IV SOLN
600.0000 mg | Freq: Once | INTRAVENOUS | Status: AC
  Administered 2018-06-20: 600 mg via INTRAVENOUS
  Filled 2018-06-20: qty 20

## 2018-06-20 NOTE — Progress Notes (Signed)
Verbal order received to d/c tele monitoring. Will continue to monitor patient.

## 2018-06-20 NOTE — Progress Notes (Signed)
  Speech Language Pathology Treatment: Dysphagia  Patient Details Name: Marvin Wagner MRN: [id number] DOB: 12-May-1930 Today's Date: 06/20/2018 Time: 1012-1035 SLP Time Calculation (min) (ACUTE ONLY): 23 min  Assessment / Plan / Recommendation Clinical Impression  Pt continues to demonstrate a cognitively-based oral dysphagia, today with decreased awareness for bolus acceptance as well as intermittent clenching of teeth, not allowing the spoon in his mouth. Given Mod A to get food/drinks into his mouth, he then swallows without overt concerns for aspiration. His acceptance and performance is likely to fluctuate throughout the day as his mentation days - would offer him POs throughout the day as able, but would not push him to eat at "meal times" if he is not as accepting at that moment. Will follow briefly for tolerance.   HPI HPI: Pt is an 85 yo male admitted from [facility] with PNA, found to be positive for COVID-19. PMH includes dementia, afib, stroke, HTN, and CAD.      SLP Plan  Continue with current plan of care       Recommendations  Diet recommendations: Dysphagia 3 (mechanical soft);Thin liquid Liquids provided via: Cup;Straw Medication Administration: Crushed with puree Supervision: Staff to assist with self feeding;Full supervision/cueing for compensatory strategies Compensations: Slow rate;Small sips/bites;Minimize environmental distractions Postural Changes and/or Swallow Maneuvers: Seated upright 90 degrees                Oral Care Recommendations: Oral care BID Follow up Recommendations: 24 hour supervision/assistance SLP Visit Diagnosis: Dysphagia, unspecified (R13.10) Plan: Continue with current plan of care       Marvin Wagner 06/20/2018, 11:46 AM  Pollyann Glen, M.A. [place] Acute Environmental education officer [phone] Office [phone]

## 2018-06-20 NOTE — Progress Notes (Signed)
PROGRESS NOTE  Marvin Wagner  CNO:[id number] DOB: 04/14/1930 DOA: 06/18/2018 PCP: Altamese Dilling, medical director of the [facility]) or [facility].   Brief Narrative: Marvin Wagner is an 85 y.o. symptoms with a history of dementia, AFib, HTN, and CAD who presented with confusion and fever found to have covid-19.  Assessment & Plan: Principal Problem:   Community acquired pneumonia Active Problems:   Hx of CABG   Dementia ([name])   Acute encephalopathy   CAP (community acquired pneumonia)  Covid-19 infection: Time course seems to have begun about 5 days ago. - Continue airborne, contact precautions. PPE including surgical gown, gloves, face shield, cap, shoe covers, and N-95 used during this encounter in a negative pressure room.  - Check daily labs: CBC w/diff, CMP, d-dimer, fibrinogen, ferritin, LDH, CRP - PCT negative. Stop abx. Will give actemra. CRP grossly elevated.  - Enoxaparin prophylactic dose.  - Blood cultures drawn.  - Avoid NSAIDs - Recommend proning and aggressive use of incentive spirometry. Pt not cooperative at this time. - Receiving actemra - Goals of care were discussed, pt is DNR. Prognosis is guarded.  LFT elevation: Could be due to covid.  - Monitor daily  AKI: Creatinine up from baseline.  - Can DC IVF's once taking adequate po.   Acute metabolic encephalopathy:  - Delirium precautions  Dysphagia: - SLP evaluation   Dementia:  - Continue home medications - Delirium precautions  Atrial fibrillation:  - Continue metoprolol.  Thrombocytopenia:  - Resolved  CAD: Minimally elevated troponin due to demand ischemia at 0.03.  - Monitor  DVT prophylaxis: Lovenox Code Status: DNR Family Communication: Webb Silversmith, the patient's guardian, discussed by phone Disposition Plan: Uncetrain  Consultants:   None  Procedures:   None  Antimicrobials:  Ceftriaxone, azithromycin   Subjective: Confused, unable to speak with me. Agitated  overnight.  Objective: Vitals:   06/20/18 0115 06/20/18 0438 06/20/18 1148 06/20/18 1612  BP: 126/65 112/68  138/66  Pulse:    65  Resp: (!) 21   14  Temp: (!) 101.8 F (38.8 C) 99.5 F (37.5 C) (!) 96.2 F (35.7 C)   TempSrc: Oral Oral Axillary   SpO2: 95% 95%  94%  Weight:      Height:        Intake/Output Summary (Last 24 hours) at 06/20/2018 1753 Last data filed at 06/20/2018 0441 Gross per 24 hour  Intake 1398.83 ml  Output 610 ml  Net 788.83 ml   Filed Weights   06/18/18 2039  Weight: 81 kg    Gen: Elderly frail male in no distress Pulm: Non-labored breathing. Clear to auscultation bilaterally.  CV: Regular rate and rhythm. No murmur, rub, or gallop. No JVD, no pedal edema. GI: Abdomen soft, non-tender, non-distended, with normoactive bowel sounds. No organomegaly or masses felt. Ext: Warm, no deformities Skin: No rashes, lesions or ulcers Neuro: Alert, not oriented. No focal neurological deficits. Psych: Judgement and insight appear impaired. Mood & affect appropriate.   Data Reviewed: I have personally reviewed following labs and imaging studies  CBC: Recent Labs  Lab 06/18/18 2101 06/19/18 0908 06/20/18 1015  WBC 8.7 7.6 6.0  NEUTROABS 7.0  --  5.0  HGB 12.3* 11.8* 10.3*  HCT 37.0* 36.3* 32.3*  MCV 101.1* 103.7* 101.3*  PLT 145* 146* 294   Basic Metabolic Panel: Recent Labs  Lab 06/18/18 2101 06/19/18 0908 06/20/18 1015  NA 141 145 145  K 3.8 3.8 4.0  CL 109 111 114*  CO2 23 27 24   GLUCOSE 118* 123* 190*  BUN 36* 31* 29*  CREATININE 1.16 1.17 1.05  CALCIUM 7.6* 7.8* 7.4*   GFR: Estimated Creatinine Clearance: 56.5 mL/min (by C-G formula based on SCr of 1.05 mg/dL). Liver Function Tests: Recent Labs  Lab 06/18/18 2101 06/20/18 1015  AST 76* 65*  ALT 38 41  ALKPHOS 65 54  BILITOT 0.9 0.6  PROT 6.5 5.9*  ALBUMIN 2.9* 2.5*   No results for input(s): LIPASE, AMYLASE in the last 168 hours. Recent Labs  Lab 06/19/18 0908  AMMONIA  19   Coagulation Profile: Recent Labs  Lab 06/19/18 1500  INR 1.2   Cardiac Enzymes: Recent Labs  Lab 06/18/18 2101 06/19/18 0908 06/20/18 0915  TROPONINI 0.04* 0.04* 0.03*   BNP (last 3 results) No results for input(s): PROBNP in the last 8760 hours. HbA1C: No results for input(s): HGBA1C in the last 72 hours. CBG: Recent Labs  Lab 06/19/18 1157 06/19/18 2103 06/20/18 0126 06/20/18 0532 06/20/18 1148  GLUCAP 139* 108* 160* 182* 181*   Lipid Profile: No results for input(s): CHOL, HDL, LDLCALC, TRIG, CHOLHDL, LDLDIRECT in the last 72 hours. Thyroid Function Tests: No results for input(s): TSH, T4TOTAL, FREET4, T3FREE, THYROIDAB in the last 72 hours. Anemia Panel: Recent Labs    06/19/18 0908 06/20/18 1015  VITAMINB12 801  --   FOLATE 21.6  --   FERRITIN 804* 1,082*  TIBC 163*  --   IRON 18*  --   RETICCTPCT 0.6  --    Urine analysis:    Component Value Date/Time   COLORURINE AMBER (A) 06/18/2018 2048   APPEARANCEUR CLOUDY (A) 06/18/2018 2048   LABSPEC 1.031 (H) 06/18/2018 2048   PHURINE 5.0 06/18/2018 2048   GLUCOSEU NEGATIVE 06/18/2018 2048   HGBUR SMALL (A) 06/18/2018 2048   BILIRUBINUR NEGATIVE 06/18/2018 2048   BILIRUBINUR neg 02/08/2017 1149   KETONESUR NEGATIVE 06/18/2018 2048   PROTEINUR 100 (A) 06/18/2018 2048   UROBILINOGEN 0.2 02/08/2017 1149   UROBILINOGEN 0.2 12/15/2008 1405   NITRITE NEGATIVE 06/18/2018 2048   LEUKOCYTESUR NEGATIVE 06/18/2018 2048   Recent Results (from the past 240 hour(s))  Urine culture     Status: None   Collection Time: 06/18/18  8:48 PM  Result Value Ref Range Status   Specimen Description   Final    URINE, RANDOM Performed at [facility], [place] [address] [place], [place] [address]    Special Requests   Final    NONE Performed at [facility], [place] [address], [place], [place] [address]    Culture   Final    NO GROWTH Performed at [facility], [place] [address], [place], [place] [address]    Report Status 06/19/2018 FINAL  Final  SARS Coronavirus 2 (CEPHEID- Performed in [place] hospital lab), Hosp Order     Status: None   Collection Time: 06/18/18  8:53 PM  Result Value Ref Range Status   SARS Coronavirus 2 NEGATIVE NEGATIVE Final    Comment: (NOTE) If result is NEGATIVE SARS-CoV-2 target nucleic acids are NOT DETECTED. The SARS-CoV-2 RNA is generally detectable in upper and lower  respiratory specimens during the acute phase of infection. The lowest  concentration of SARS-CoV-2 viral copies this assay can detect is 250  copies / mL. A negative result does not preclude SARS-CoV-2 infection  and should not be used as the sole basis for treatment or other  patient management decisions.  A negative  result may occur with  improper specimen collection / handling, submission of specimen other  than nasopharyngeal swab, presence of viral mutation(s) within the  areas targeted by this assay, and inadequate number of viral copies  (<250 copies / mL). A negative result must be combined with clinical  observations, patient history, and epidemiological information. If result is POSITIVE SARS-CoV-2 target nucleic acids are DETECTED. The SARS-CoV-2 RNA is generally detectable in upper and lower  respiratory specimens dur ing the acute phase of infection.  Positive  results are indicative of active infection with SARS-CoV-2.  Clinical  correlation with patient history and other diagnostic information is  necessary to determine patient infection status.  Positive results do  not rule out bacterial infection or co-infection with other viruses. If result is PRESUMPTIVE POSTIVE SARS-CoV-2 nucleic acids MAY BE PRESENT.   A presumptive positive result was obtained on the submitted specimen  and confirmed on repeat testing.  While 2019 novel coronavirus  (SARS-CoV-2) nucleic acids may be present in the submitted sample  additional confirmatory testing may  be necessary for epidemiological  and / or clinical management purposes  to differentiate between  SARS-CoV-2 and other Sarbecovirus currently known to infect humans.  If clinically indicated additional testing with an alternate test  methodology [phone]) is advised. The SARS-CoV-2 RNA is generally  detectable in upper and lower respiratory sp ecimens during the acute  phase of infection. The expected result is Negative. Fact Sheet for Patients:  StrictlyIdeas.no Fact Sheet for Healthcare Providers: BankingDealers.co.za This test is not yet approved or cleared by the [place] FDA and has been authorized for detection and/or diagnosis of SARS-CoV-2 by FDA under an Emergency Use Authorization (EUA).  This EUA will remain in effect (meaning this test can be used) for the duration of the COVID-19 declaration under Section 564(b)(1) of the Act, 21 U.S.C. section 360bbb-3(b)(1), unless the authorization is terminated or revoked sooner. Performed at [facility], [place] [address], [place], [place] [address]   Blood Culture (routine x 2)     Status: None (Preliminary result)   Collection Time: 06/18/18  9:01 PM  Result Value Ref Range Status   Specimen Description   Final    BLOOD LEFT ARM Performed at [facility] [address] [place], [place] [address]    Special Requests   Final    BOTTLES DRAWN AEROBIC AND ANAEROBIC Blood Culture adequate volume Performed at [facility] [address], [place], [place] [address]    Culture   Final    NO GROWTH 2 DAYS Performed at [facility] [address] [place], [place] [address]    Report Status PENDING  Incomplete  Blood Culture (routine x 2)     Status: None (Preliminary result)   Collection Time: 06/18/18  9:01 PM  Result Value Ref Range Status   Specimen Description   Final    BLOOD RIGHT ARM Performed at [facility] [address] [place], [place] [address]    Special Requests   Final    BOTTLES DRAWN AEROBIC AND ANAEROBIC Blood Culture adequate volume Performed at [facility] [address], [place], [place] [address]    Culture   Final    NO GROWTH 2 DAYS Performed at [facility] [address], [place], [place] [address]    Report Status PENDING  Incomplete  MRSA PCR Screening     Status: None  Collection Time: 06/19/18  2:53 AM  Result Value Ref Range Status   MRSA by PCR NEGATIVE NEGATIVE Final    Comment:        The GeneXpert MRSA Assay (FDA approved for NASAL specimens only), is one component of a comprehensive MRSA colonization surveillance program. It is not intended to diagnose MRSA infection nor to guide or monitor treatment for MRSA infections. Performed at [facility], [place] [address], [place], [place] [address]   SARS Coronavirus 2 (CEPHEID- Performed in [place] hospital lab), Hosp Order     Status: Abnormal   Collection Time: 06/19/18  6:15 AM  Result Value Ref Range Status   SARS Coronavirus 2 POSITIVE (A) NEGATIVE Final    Comment: RESULT CALLED TO, READ BACK BY AND VERIFIED WITH: NELSON,A. RN @0809  ON 5.19.2020 BY NMCCOY (NOTE) If result is NEGATIVE SARS-CoV-2 target nucleic acids are NOT DETECTED. The SARS-CoV-2 RNA is generally detectable in upper and lower  respiratory specimens during the acute phase of infection. The lowest  concentration of SARS-CoV-2 viral copies this assay can detect is 250  copies / mL. A negative result does not preclude SARS-CoV-2 infection  and should not be used as the sole basis for treatment or other  patient management decisions.  A negative result may occur with  improper specimen collection / handling, submission of specimen other  than nasopharyngeal swab, presence of viral mutation(s) within the  areas targeted by this assay, and inadequate number of viral  copies  (<250 copies / mL). A negative result must be combined with clinical  observations, patient history, and epidemiological information. If result is POSITIVE SARS-CoV-2 target nucleic acids are DETECT ED. The SARS-CoV-2 RNA is generally detectable in upper and lower  respiratory specimens during the acute phase of infection.  Positive  results are indicative of active infection with SARS-CoV-2.  Clinical  correlation with patient history and other diagnostic information is  necessary to determine patient infection status.  Positive results do  not rule out bacterial infection or co-infection with other viruses. If result is PRESUMPTIVE POSTIVE SARS-CoV-2 nucleic acids MAY BE PRESENT.   A presumptive positive result was obtained on the submitted specimen  and confirmed on repeat testing.  While 2019 novel coronavirus  (SARS-CoV-2) nucleic acids may be present in the submitted sample  additional confirmatory testing may be necessary for epidemiological  and / or clinical management purposes  to differentiate between  SARS-CoV-2 and other Sarbecovirus currently known to infect humans.  If clinically indicated additional testing with an alternate test  methodology ([id number] [id number]) is advised. The SARS-CoV-2 RNA is generally  detectable in upper and lower respiratory specimens during the acute  phase of infection. The expected result is Negative. Fact Sheet for Patients:  StrictlyIdeas.no Fact Sheet for Healthcare Providers: BankingDealers.co.za This test is not yet approved or cleared by the [place] FDA and has been authorized for detection and/or diagnosis of SARS-CoV-2 by FDA under an Emergency Use Authorization (EUA).  This EUA will remain in effect (meaning this test can be used) for the duration of the COVID-19 declaration under Section 564(b)(1) of the Act, 21 U.S.C. section 360bbb-3(b)(1), unless the authorization is terminated  or revoked sooner. Performed at [facility], [place] [address], [place], [place] [address]       Radiology Studies: Ct Head Wo Contrast  Result Date: 06/19/2018 CLINICAL DATA:  85 year old male with shortness of breath weakness, altered mental status. Fever of 101.4 F. EXAM: CT  HEAD WITHOUT CONTRAST TECHNIQUE: Contiguous axial images were obtained from the base of the skull through the vertex without intravenous contrast. COMPARISON:  Head CT 01/14/2017, MRI 12/26/2016 and earlier. FINDINGS: Brain: Stable cerebral volume. Stable ventricle size and configuration. No midline shift, mass effect, or evidence of intracranial mass lesion. No acute intracranial hemorrhage identified. Stable gray-white matter differentiation throughout the brain. No cortically based acute infarct identified. Vascular: Calcified atherosclerosis at the skull base. No suspicious intracranial vascular hyperdensity. Skull: No acute osseous abnormality identified. Sinuses/Orbits: Visualized paranasal sinuses and mastoids are stable and well pneumatized. Other: Stable orbit and scalp soft tissues. IMPRESSION: Stable non contrast CT appearance of the brain since 2018. No acute intracranial abnormality. Electronically Signed   By: Genevie Ann M.D.   On: 06/19/2018 00:24   Dg Chest Portable 1 View  Result Date: 06/18/2018 CLINICAL DATA:  85 year old male with recent pneumonia. Fever and shortness of breath. COVID-19 status is pending. EXAM: PORTABLE CHEST 1 VIEW COMPARISON:  01/14/2017 and earlier. FINDINGS: Portable AP semi upright view at 2042 hours. Ill-defined abnormal right upper lobe opacity near the hilum is new since 2018. there is also patchy confluent opacity at the right lateral costophrenic angle. There is mild superimposed asymmetric indistinct and interstitial opacity in both lungs. Chronic prosthetic cardiac valve. Stable cardiac size and mediastinal contours. Calcified aortic atherosclerosis. Visualized  tracheal air column is within normal limits. No pneumothorax or pleural effusion. Negative visible bowel gas pattern. No acute osseous abnormality identified. IMPRESSION: Abnormal right greater than left pulmonary opacity most confluent in the right upper lobe opacity. Consider acute pneumonia versus viral/atypical respiratory infection. No pleural effusion. Electronically Signed   By: Genevie Ann M.D.   On: 06/18/2018 21:43    Scheduled Meds: . aspirin EC  81 mg Oral Daily  . atorvastatin  40 mg Oral Daily  . donepezil  10 mg Oral QHS  . enoxaparin (LOVENOX) injection  40 mg Subcutaneous Q24H  . mouth rinse  15 mL Mouth Rinse BID  . Melatonin  1 mg Oral QHS  . memantine  10 mg Oral BID  . methylPREDNISolone (SOLU-MEDROL) injection  60 mg Intravenous Q8H  . metoprolol tartrate  12.5 mg Oral Daily   Continuous Infusions: . sodium chloride Stopped (06/19/18 2348)  . sodium chloride Stopped (06/20/18 0117)  . azithromycin Stopped (06/19/18 2214)  . cefTRIAXone (ROCEPHIN)  IV Stopped (06/19/18 2134)  . tocilizumab (ACTEMRA) IV       LOS: 1 day   Time spent: 35 minutes.  Patrecia Pour, MD [facility] [email or web address] Password [facility] 06/20/2018, 5:53 PM

## 2018-06-20 NOTE — Progress Notes (Signed)
Patient is demented and agitated. Pulled of tele monitor off  and refuses to have them replaced. Patient is combative with staff and attempting to bite staff. MD paged to determine whether cardiac monitor can be d/c'd. Awaiting call back from provider.

## 2018-06-21 LAB — GLUCOSE, CAPILLARY
Glucose-Capillary: 135 mg/dL — ABNORMAL HIGH (ref 70–99)
Glucose-Capillary: 145 mg/dL — ABNORMAL HIGH (ref 70–99)
Glucose-Capillary: 145 mg/dL — ABNORMAL HIGH (ref 70–99)
Glucose-Capillary: 145 mg/dL — ABNORMAL HIGH (ref 70–99)
Glucose-Capillary: 159 mg/dL — ABNORMAL HIGH (ref 70–99)

## 2018-06-21 LAB — COMPREHENSIVE METABOLIC PANEL
ALT: 59 U/L — ABNORMAL HIGH (ref 0–44)
AST: 83 U/L — ABNORMAL HIGH (ref 15–41)
Albumin: 2.5 g/dL — ABNORMAL LOW (ref 3.5–5.0)
Alkaline Phosphatase: 58 U/L (ref 38–126)
Anion gap: 10 (ref 5–15)
BUN: 40 mg/dL — ABNORMAL HIGH (ref 8–23)
CO2: 21 mmol/L — ABNORMAL LOW (ref 22–32)
Calcium: 7.8 mg/dL — ABNORMAL LOW (ref 8.9–10.3)
Chloride: 115 mmol/L — ABNORMAL HIGH (ref 98–111)
Creatinine, Ser: 0.95 mg/dL (ref 0.61–1.24)
GFR calc Af Amer: >60 mL/min (ref >60)
GFR calc non Af Amer: >60 mL/min (ref >60)
Glucose, Bld: 164 mg/dL — ABNORMAL HIGH (ref 70–99)
Potassium: 3.8 mmol/L (ref 3.5–5.1)
Sodium: 146 mmol/L — ABNORMAL HIGH (ref 135–145)
Total Bilirubin: 0.6 mg/dL (ref 0.3–1.2)
Total Protein: 6 g/dL — ABNORMAL LOW (ref 6.5–8.1)

## 2018-06-21 LAB — LEGIONELLA PNEUMOPHILA SEROGP 1 UR AG: L. pneumophila Serogp 1 Ur Ag: NEGATIVE

## 2018-06-21 LAB — RESPIRATORY PANEL BY PCR

## 2018-06-21 LAB — CBC WITH DIFFERENTIAL/PLATELET
Abs Immature Granulocytes: 0.08 10*3/uL — ABNORMAL HIGH (ref 0.00–0.07)
Basophils Absolute: 0 10*3/uL (ref 0.0–0.1)
Basophils Relative: 0 %
Eosinophils Absolute: 0 10*3/uL (ref 0.0–0.5)
Eosinophils Relative: 0 %
HCT: 33.9 % — ABNORMAL LOW (ref 39.0–52.0)
Hemoglobin: 11 g/dL — ABNORMAL LOW (ref 13.0–17.0)
Immature Granulocytes: 1 %
Lymphocytes Relative: 14 %
Lymphs Abs: 1.3 10*3/uL (ref 0.7–4.0)
MCH: 32.4 pg (ref 26.0–34.0)
MCHC: 32.4 g/dL (ref 30.0–36.0)
MCV: 100 fL (ref 80.0–100.0)
Monocytes Absolute: 0.5 10*3/uL (ref 0.1–1.0)
Monocytes Relative: 6 %
Neutro Abs: 7.5 10*3/uL (ref 1.7–7.7)
Neutrophils Relative %: 79 %
Platelets: 186 10*3/uL (ref 150–400)
RBC: 3.39 MIL/uL — ABNORMAL LOW (ref 4.22–5.81)
RDW: 12.7 % (ref 11.5–15.5)
WBC: 9.4 10*3/uL (ref 4.0–10.5)
nRBC: 0 % (ref 0.0–0.2)

## 2018-06-21 LAB — HEPATITIS B SURFACE ANTIGEN: Hepatitis B Surface Ag: NEGATIVE

## 2018-06-21 LAB — C-REACTIVE PROTEIN: CRP: 10.3 mg/dL — ABNORMAL HIGH (ref <1.0)

## 2018-06-21 LAB — D-DIMER, QUANTITATIVE: D-Dimer, Quant: 1.39 ug/mL-FEU — ABNORMAL HIGH (ref 0.00–0.50)

## 2018-06-21 LAB — MAGNESIUM: Magnesium: 3 mg/dL — ABNORMAL HIGH (ref 1.7–2.4)

## 2018-06-21 LAB — FERRITIN: Ferritin: 1473 ng/mL — ABNORMAL HIGH (ref 24–336)

## 2018-06-21 MED ORDER — LORAZEPAM 2 MG/ML IJ SOLN
1.0000 mg | Freq: Once | INTRAMUSCULAR | Status: AC
  Administered 2018-06-21: 1 mg via INTRAVENOUS
  Filled 2018-06-21: qty 1

## 2018-06-21 MED ORDER — SODIUM CHLORIDE 0.45 % IV SOLN
INTRAVENOUS | Status: DC
  Administered 2018-06-21 – 2018-06-22 (×2): via INTRAVENOUS

## 2018-06-21 MED ORDER — METHYLPREDNISOLONE SODIUM SUCC 125 MG IJ SOLR
40.0000 mg | Freq: Three times a day (TID) | INTRAMUSCULAR | Status: DC
  Administered 2018-06-21 – 2018-06-22 (×2): 40 mg via INTRAVENOUS
  Filled 2018-06-21 (×2): qty 2

## 2018-06-21 NOTE — Progress Notes (Signed)
respiratory panel sent to lab @1515 

## 2018-06-21 NOTE — Progress Notes (Signed)
PROGRESS NOTE  Marvin Wagner  KCL:[id number] DOB: 1930/11/17 DOA: 06/18/2018 PCP: Altamese Dilling, medical director of the [facility]) or [facility].   Brief Narrative: Marvin Wagner is an 85 y.o. symptoms with a history of dementia, AFib, HTN, and CAD who presented with confusion and fever found to have covid-19.  Assessment & Plan: Principal Problem:   Community acquired pneumonia Active Problems:   Hx of CABG   Dementia ([place])   Acute encephalopathy   CAP (community acquired pneumonia)  Covid-19 infection: Time course seems to have begun about 5 days ago. - Continue airborne, contact precautions. PPE including surgical gown, gloves, face shield, cap, shoe covers, and N-95 used during this encounter in a negative pressure room.  - Check daily labs: CBC w/diff, CMP, d-dimer, fibrinogen, ferritin, LDH, CRP - PCT negative. Stopped abx.  - s/p actemra 5/20. CRP grossly elevated, improving.  - Enoxaparin prophylactic dose.  - Blood cultures drawn.  - Avoid NSAIDs - Recommend proning and aggressive use of incentive spirometry. Pt not cooperative at this time. - Goals of care were discussed, pt is DNR. Prognosis is guarded.  LFT elevation: Could be due to covid. Up a bit today, will not repeat actemra dosing.  - Monitor daily  AKI: Creatinine up from baseline. Improving - BUN still elevated. Will give low rate IVF and decrease steroid dosing.  - Avoid nephrotoxins - Monitor UOP  Hypernatremia: Mild.  - Hypotonic IVF.   Acute metabolic encephalopathy:  - Delirium precautions  Dysphagia: - SLP evaluation   Dementia:  - Continue home medications - Delirium precautions  Chronic atrial fibrillation:  - Continue metoprolol.  Thrombocytopenia:  - Resolved  CAD: Minimally elevated troponin due to demand ischemia at 0.03.  - Monitor  DVT prophylaxis: Lovenox Code Status: DNR Family Communication: Webb Silversmith, the patient's guardian, discussed by phone  Disposition Plan: Uncertain  Consultants:   None  Procedures:   None  Antimicrobials:  Ceftriaxone, azithromycin   Subjective: Remains confused, similar to previous encounter. No agitation overnight. Denies any trouble breathing.  Objective: Vitals:   06/20/18 1612 06/20/18 2032 06/21/18 0700 06/21/18 1058  BP: 138/66 137/85 (!) 158/64 (!) 141/64  Pulse: 65 92 74   Resp: 14 15 19    Temp:  (!) 96.4 F (35.8 C) (!) 96.5 F (35.8 C)   TempSrc:  Axillary Axillary   SpO2: 94% 95% 95%   Weight:      Height:        Intake/Output Summary (Last 24 hours) at 06/21/2018 1316 Last data filed at 06/20/2018 1813 Gross per 24 hour  Intake 100 ml  Output -  Net 100 ml   Filed Weights   06/18/18 2039  Weight: 81 kg   Gen: Frail, elderly, confused male in no distress Pulm: Nonlabored breathing room air. Clear. CV: Regular rate and rhythm. No murmur, rub, or gallop. No JVD, no significant dependent edema. GI: Abdomen soft, non-tender, non-distended, with normoactive bowel sounds.  Ext: Warm, no deformities Skin: No rashes, lesions or ulcers on visualized skin. Neuro: Drowsy, rousable, not oriented. No focal neurological deficits on limited exam. Psych: Judgement and insight appear impaired   Data Reviewed: I have personally reviewed following labs and imaging studies  CBC: Recent Labs  Lab 06/18/18 2101 06/19/18 0908 06/20/18 1015 06/21/18 0404  WBC 8.7 7.6 6.0 9.4  NEUTROABS 7.0  --  5.0 7.5  HGB 12.3* 11.8* 10.3* 11.0*  HCT 37.0* 36.3* 32.3* 33.9*  MCV 101.1* 103.7* 101.3* 100.0  PLT 145* 146* 155 035   Basic Metabolic Panel: Recent Labs  Lab 06/18/18 2101 06/19/18 0908 06/20/18 1015 06/21/18 0404  NA 141 145 145 146*  K 3.8 3.8 4.0 3.8  CL 109 111 114* 115*  CO2 23 27 24  21*  GLUCOSE 118* 123* 190* 164*  BUN 36* 31* 29* 40*  CREATININE 1.16 1.17 1.05 0.95  CALCIUM 7.6* 7.8* 7.4* 7.8*  MG  --   --   --  3.0*   GFR: Estimated Creatinine Clearance:  62.4 mL/min (by C-G formula based on SCr of 0.95 mg/dL). Liver Function Tests: Recent Labs  Lab 06/18/18 2101 06/20/18 1015 06/21/18 0404  AST 76* 65* 83*  ALT 38 41 59*  ALKPHOS 65 54 58  BILITOT 0.9 0.6 0.6  PROT 6.5 5.9* 6.0*  ALBUMIN 2.9* 2.5* 2.5*   No results for input(s): LIPASE, AMYLASE in the last 168 hours. Recent Labs  Lab 06/19/18 0908  AMMONIA 19   Coagulation Profile: Recent Labs  Lab 06/19/18 1500  INR 1.2   Cardiac Enzymes: Recent Labs  Lab 06/18/18 2101 06/19/18 0908 06/20/18 0915  TROPONINI 0.04* 0.04* 0.03*   BNP (last 3 results) No results for input(s): PROBNP in the last 8760 hours. HbA1C: No results for input(s): HGBA1C in the last 72 hours. CBG: Recent Labs  Lab 06/20/18 1751 06/20/18 2029 06/21/18 0029 06/21/18 0700 06/21/18 0804  GLUCAP 141* 149* 145* 145* 135*   Lipid Profile: No results for input(s): CHOL, HDL, LDLCALC, TRIG, CHOLHDL, LDLDIRECT in the last 72 hours. Thyroid Function Tests: No results for input(s): TSH, T4TOTAL, FREET4, T3FREE, THYROIDAB in the last 72 hours. Anemia Panel: Recent Labs    06/19/18 0908 06/20/18 1015 06/21/18 0403  VITAMINB12 801  --   --   FOLATE 21.6  --   --   FERRITIN 804* 1,082* 1,473*  TIBC 163*  --   --   IRON 18*  --   --   RETICCTPCT 0.6  --   --    Urine analysis:    Component Value Date/Time   COLORURINE AMBER (A) 06/18/2018 2048   APPEARANCEUR CLOUDY (A) 06/18/2018 2048   LABSPEC 1.031 (H) 06/18/2018 2048   PHURINE 5.0 06/18/2018 2048   GLUCOSEU NEGATIVE 06/18/2018 2048   HGBUR SMALL (A) 06/18/2018 2048   BILIRUBINUR NEGATIVE 06/18/2018 2048   BILIRUBINUR neg 02/08/2017 1149   KETONESUR NEGATIVE 06/18/2018 2048   PROTEINUR 100 (A) 06/18/2018 2048   UROBILINOGEN 0.2 02/08/2017 1149   UROBILINOGEN 0.2 12/15/2008 1405   NITRITE NEGATIVE 06/18/2018 2048   LEUKOCYTESUR NEGATIVE 06/18/2018 2048   Recent Results (from the past 240 hour(s))  Urine culture     Status: None    Collection Time: 06/18/18  8:48 PM  Result Value Ref Range Status   Specimen Description   Final    URINE, RANDOM Performed at [facility], [place] [address], [place], [place] [address]    Special Requests   Final    NONE Performed at [facility], [place] [address] [place], [place] [address]    Culture   Final    NO GROWTH Performed at [facility], [place] [address] [place], [place] [address]    Report Status 06/19/2018 FINAL  Final  SARS Coronavirus 2 (CEPHEID- Performed in [place] hospital lab), Hosp Order     Status: None   Collection Time: 06/18/18  8:53 PM  Result Value Ref Range Status   SARS Coronavirus 2 NEGATIVE  NEGATIVE Final    Comment: (NOTE) If result is NEGATIVE SARS-CoV-2 target nucleic acids are NOT DETECTED. The SARS-CoV-2 RNA is generally detectable in upper and lower  respiratory specimens during the acute phase of infection. The lowest  concentration of SARS-CoV-2 viral copies this assay can detect is 250  copies / mL. A negative result does not preclude SARS-CoV-2 infection  and should not be used as the sole basis for treatment or other  patient management decisions.  A negative result may occur with  improper specimen collection / handling, submission of specimen other  than nasopharyngeal swab, presence of viral mutation(s) within the  areas targeted by this assay, and inadequate number of viral copies  (<250 copies / mL). A negative result must be combined with clinical  observations, patient history, and epidemiological information. If result is POSITIVE SARS-CoV-2 target nucleic acids are DETECTED. The SARS-CoV-2 RNA is generally detectable in upper and lower  respiratory specimens dur ing the acute phase of infection.  Positive  results are indicative of active infection with SARS-CoV-2.  Clinical  correlation with patient history and other diagnostic information is  necessary to determine patient  infection status.  Positive results do  not rule out bacterial infection or co-infection with other viruses. If result is PRESUMPTIVE POSTIVE SARS-CoV-2 nucleic acids MAY BE PRESENT.   A presumptive positive result was obtained on the submitted specimen  and confirmed on repeat testing.  While 2019 novel coronavirus  (SARS-CoV-2) nucleic acids may be present in the submitted sample  additional confirmatory testing may be necessary for epidemiological  and / or clinical management purposes  to differentiate between  SARS-CoV-2 and other Sarbecovirus currently known to infect humans.  If clinically indicated additional testing with an alternate test  methodology [phone]) is advised. The SARS-CoV-2 RNA is generally  detectable in upper and lower respiratory sp ecimens during the acute  phase of infection. The expected result is Negative. Fact Sheet for Patients:  StrictlyIdeas.no Fact Sheet for Healthcare Providers: BankingDealers.co.za This test is not yet approved or cleared by the [place] FDA and has been authorized for detection and/or diagnosis of SARS-CoV-2 by FDA under an Emergency Use Authorization (EUA).  This EUA will remain in effect (meaning this test can be used) for the duration of the COVID-19 declaration under Section 564(b)(1) of the Act, 21 U.S.C. section 360bbb-3(b)(1), unless the authorization is terminated or revoked sooner. Performed at [facility], [place] [address], [place], [place] [address]   Blood Culture (routine x 2)     Status: None (Preliminary result)   Collection Time: 06/18/18  9:01 PM  Result Value Ref Range Status   Specimen Description   Final    BLOOD LEFT ARM Performed at [place] [address] [place], [place] [address]    Special Requests   Final    BOTTLES DRAWN AEROBIC AND ANAEROBIC Blood Culture adequate volume Performed at [place] [address] [place], [place] [address]    Culture   Final    NO GROWTH 3 DAYS Performed at [facility], [place] [address], [place], [place] [address]    Report Status PENDING  Incomplete  Blood Culture (routine x 2)     Status: None (Preliminary result)   Collection Time: 06/18/18  9:01 PM  Result Value Ref Range Status   Specimen Description   Final    BLOOD RIGHT ARM Performed at [facility]  [place] [place], [place] [address]    Special Requests   Final    BOTTLES DRAWN AEROBIC AND ANAEROBIC Blood Culture adequate volume Performed at [place] [address], [place], [place] [address]    Culture   Final    NO GROWTH 3 DAYS Performed at [facility], [place] [address], [place], [place] [address]    Report Status PENDING  Incomplete  MRSA PCR Screening     Status: None   Collection Time: 06/19/18  2:53 AM  Result Value Ref Range Status   MRSA by PCR NEGATIVE NEGATIVE Final    Comment:        The GeneXpert MRSA Assay (FDA approved for NASAL specimens only), is one component of a comprehensive MRSA colonization surveillance program. It is not intended to diagnose MRSA infection nor to guide or monitor treatment for MRSA infections. Performed at [facility], [place] [address], [place], [place] [address]   SARS Coronavirus 2 (CEPHEID- Performed in [place] hospital lab), Hosp Order     Status: Abnormal   Collection Time: 06/19/18  6:15 AM  Result Value Ref Range Status   SARS Coronavirus 2 POSITIVE (A) NEGATIVE Final    Comment: RESULT CALLED TO, READ BACK BY AND VERIFIED WITH: NELSON,A. RN @0809  ON 5.19.2020 BY [email or web address] (NOTE) If result is NEGATIVE SARS-CoV-2 target nucleic acids are NOT DETECTED. The SARS-CoV-2 RNA is generally detectable in upper and lower  respiratory specimens during the acute phase of infection. The lowest  concentration of SARS-CoV-2 viral copies  this assay can detect is 250  copies / mL. A negative result does not preclude SARS-CoV-2 infection  and should not be used as the sole basis for treatment or other  patient management decisions.  A negative result may occur with  improper specimen collection / handling, submission of specimen other  than nasopharyngeal swab, presence of viral mutation(s) within the  areas targeted by this assay, and inadequate number of viral copies  (<250 copies / mL). A negative result must be combined with clinical  observations, patient history, and epidemiological information. If result is POSITIVE SARS-CoV-2 target nucleic acids are DETECT ED. The SARS-CoV-2 RNA is generally detectable in upper and lower  respiratory specimens during the acute phase of infection.  Positive  results are indicative of active infection with SARS-CoV-2.  Clinical  correlation with patient history and other diagnostic information is  necessary to determine patient infection status.  Positive results do  not rule out bacterial infection or co-infection with other viruses. If result is PRESUMPTIVE POSTIVE SARS-CoV-2 nucleic acids MAY BE PRESENT.   A presumptive positive result was obtained on the submitted specimen  and confirmed on repeat testing.  While 2019 novel coronavirus  (SARS-CoV-2) nucleic acids may be present in the submitted sample  additional confirmatory testing may be necessary for epidemiological  and / or clinical management purposes  to differentiate between  SARS-CoV-2 and other Sarbecovirus currently known to infect humans.  If clinically indicated additional testing with an alternate test  methodology ([id number] [id number]) is advised. The SARS-CoV-2 RNA is generally  detectable in upper and lower respiratory specimens during the acute  phase of infection. The expected result is Negative. Fact Sheet for Patients:  StrictlyIdeas.no Fact Sheet for Healthcare Providers:  BankingDealers.co.za This test is not yet approved or cleared by the [place] FDA and has been authorized for detection and/or diagnosis of SARS-CoV-2 by FDA under an Emergency Use Authorization (EUA).  This  EUA will remain in effect (meaning this test can be used) for the duration of the COVID-19 declaration under Section 564(b)(1) of the Act, 21 U.S.C. section 360bbb-3(b)(1), unless the authorization is terminated or revoked sooner. Performed at [facility], [place] [address] [place], [place] [address]       Radiology Studies: No results found.  Scheduled Meds: . aspirin EC  81 mg Oral Daily  . atorvastatin  40 mg Oral Daily  . donepezil  10 mg Oral QHS  . enoxaparin (LOVENOX) injection  40 mg Subcutaneous Q24H  . mouth rinse  15 mL Mouth Rinse BID  . Melatonin  1 mg Oral QHS  . memantine  10 mg Oral BID  . methylPREDNISolone (SOLU-MEDROL) injection  60 mg Intravenous Q8H  . metoprolol tartrate  12.5 mg Oral Daily   Continuous Infusions: . sodium chloride Stopped (06/19/18 2348)  . sodium chloride Stopped (06/20/18 0117)     LOS: 2 days   Time spent: 35 minutes.  Patrecia Pour, MD [facility] [email or web address] Password [email or web address] 06/21/2018, 1:16 PM

## 2018-06-22 DIAGNOSIS — U071 COVID-19: Principal | ICD-10-CM

## 2018-06-22 LAB — COMPREHENSIVE METABOLIC PANEL
ALT: 64 U/L — ABNORMAL HIGH (ref 0–44)
AST: 67 U/L — ABNORMAL HIGH (ref 15–41)
Albumin: 2.7 g/dL — ABNORMAL LOW (ref 3.5–5.0)
Alkaline Phosphatase: 66 U/L (ref 38–126)
Anion gap: 10 (ref 5–15)
BUN: 47 mg/dL — ABNORMAL HIGH (ref 8–23)
CO2: 23 mmol/L (ref 22–32)
Calcium: 8 mg/dL — ABNORMAL LOW (ref 8.9–10.3)
Chloride: 115 mmol/L — ABNORMAL HIGH (ref 98–111)
Creatinine, Ser: 0.96 mg/dL (ref 0.61–1.24)
GFR calc Af Amer: >60 mL/min (ref >60)
GFR calc non Af Amer: >60 mL/min (ref >60)
Glucose, Bld: 148 mg/dL — ABNORMAL HIGH (ref 70–99)
Potassium: 3.4 mmol/L — ABNORMAL LOW (ref 3.5–5.1)
Sodium: 148 mmol/L — ABNORMAL HIGH (ref 135–145)
Total Bilirubin: 0.7 mg/dL (ref 0.3–1.2)
Total Protein: 6.1 g/dL — ABNORMAL LOW (ref 6.5–8.1)

## 2018-06-22 LAB — CBC WITH DIFFERENTIAL/PLATELET
Abs Immature Granulocytes: 0.13 10*3/uL — ABNORMAL HIGH (ref 0.00–0.07)
Basophils Absolute: 0 10*3/uL (ref 0.0–0.1)
Basophils Relative: 0 %
Eosinophils Absolute: 0 10*3/uL (ref 0.0–0.5)
Eosinophils Relative: 0 %
HCT: 34.4 % — ABNORMAL LOW (ref 39.0–52.0)
Hemoglobin: 11.2 g/dL — ABNORMAL LOW (ref 13.0–17.0)
Immature Granulocytes: 1 %
Lymphocytes Relative: 6 %
Lymphs Abs: 0.7 10*3/uL (ref 0.7–4.0)
MCH: 32.3 pg (ref 26.0–34.0)
MCHC: 32.6 g/dL (ref 30.0–36.0)
MCV: 99.1 fL (ref 80.0–100.0)
Monocytes Absolute: 0.6 10*3/uL (ref 0.1–1.0)
Monocytes Relative: 4 %
Neutro Abs: 11.5 10*3/uL — ABNORMAL HIGH (ref 1.7–7.7)
Neutrophils Relative %: 89 %
Platelets: 227 10*3/uL (ref 150–400)
RBC: 3.47 MIL/uL — ABNORMAL LOW (ref 4.22–5.81)
RDW: 12.5 % (ref 11.5–15.5)
WBC: 12.9 10*3/uL — ABNORMAL HIGH (ref 4.0–10.5)
nRBC: 0 % (ref 0.0–0.2)

## 2018-06-22 LAB — GLUCOSE, CAPILLARY
Glucose-Capillary: 143 mg/dL — ABNORMAL HIGH (ref 70–99)
Glucose-Capillary: 144 mg/dL — ABNORMAL HIGH (ref 70–99)
Glucose-Capillary: 147 mg/dL — ABNORMAL HIGH (ref 70–99)
Glucose-Capillary: 148 mg/dL — ABNORMAL HIGH (ref 70–99)

## 2018-06-22 LAB — MAGNESIUM: Magnesium: 3.2 mg/dL — ABNORMAL HIGH (ref 1.7–2.4)

## 2018-06-22 LAB — C-REACTIVE PROTEIN: CRP: 4.4 mg/dL — ABNORMAL HIGH (ref <1.0)

## 2018-06-22 LAB — D-DIMER, QUANTITATIVE: D-Dimer, Quant: 1.72 ug/mL-FEU — ABNORMAL HIGH (ref 0.00–0.50)

## 2018-06-22 LAB — FERRITIN: Ferritin: 1143 ng/mL — ABNORMAL HIGH (ref 24–336)

## 2018-06-22 MED ORDER — LORAZEPAM 2 MG/ML IJ SOLN
1.0000 mg | INTRAMUSCULAR | Status: DC | PRN
  Administered 2018-06-28 – 2018-07-06 (×4): 1 mg via INTRAVENOUS
  Filled 2018-06-22 (×4): qty 1

## 2018-06-22 MED ORDER — POTASSIUM CL IN DEXTROSE 5% 20 MEQ/L IV SOLN
20.0000 meq | INTRAVENOUS | Status: AC
  Administered 2018-06-22: 20 meq via INTRAVENOUS
  Filled 2018-06-22 (×2): qty 1000
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
## 2018-06-23 LAB — CBC WITH DIFFERENTIAL/PLATELET
Abs Immature Granulocytes: 0.23 10*3/uL — ABNORMAL HIGH (ref 0.00–0.07)
Basophils Absolute: 0 10*3/uL (ref 0.0–0.1)
Basophils Relative: 0 %
Eosinophils Absolute: 0 10*3/uL (ref 0.0–0.5)
Eosinophils Relative: 0 %
HCT: 33 % — ABNORMAL LOW (ref 39.0–52.0)
Hemoglobin: 10.7 g/dL — ABNORMAL LOW (ref 13.0–17.0)
Immature Granulocytes: 2 %
Lymphocytes Relative: 7 %
Lymphs Abs: 0.9 10*3/uL (ref 0.7–4.0)
MCH: 32.4 pg (ref 26.0–34.0)
MCHC: 32.4 g/dL (ref 30.0–36.0)
MCV: 100 fL (ref 80.0–100.0)
Monocytes Absolute: 0.7 10*3/uL (ref 0.1–1.0)
Monocytes Relative: 6 %
Neutro Abs: 10.6 10*3/uL — ABNORMAL HIGH (ref 1.7–7.7)
Neutrophils Relative %: 85 %
Platelets: 220 10*3/uL (ref 150–400)
RBC: 3.3 MIL/uL — ABNORMAL LOW (ref 4.22–5.81)
RDW: 12.6 % (ref 11.5–15.5)
WBC: 12.5 10*3/uL — ABNORMAL HIGH (ref 4.0–10.5)
nRBC: 0.4 % — ABNORMAL HIGH (ref 0.0–0.2)

## 2018-06-23 LAB — COMPREHENSIVE METABOLIC PANEL
ALT: 88 U/L — ABNORMAL HIGH (ref 0–44)
AST: 95 U/L — ABNORMAL HIGH (ref 15–41)
Albumin: 2.5 g/dL — ABNORMAL LOW (ref 3.5–5.0)
Alkaline Phosphatase: 69 U/L (ref 38–126)
Anion gap: 8 (ref 5–15)
BUN: 39 mg/dL — ABNORMAL HIGH (ref 8–23)
CO2: 22 mmol/L (ref 22–32)
Calcium: 7.9 mg/dL — ABNORMAL LOW (ref 8.9–10.3)
Chloride: 116 mmol/L — ABNORMAL HIGH (ref 98–111)
Creatinine, Ser: 0.91 mg/dL (ref 0.61–1.24)
GFR calc Af Amer: >60 mL/min (ref >60)
GFR calc non Af Amer: >60 mL/min (ref >60)
Glucose, Bld: 122 mg/dL — ABNORMAL HIGH (ref 70–99)
Potassium: 3.8 mmol/L (ref 3.5–5.1)
Sodium: 146 mmol/L — ABNORMAL HIGH (ref 135–145)
Total Bilirubin: 0.7 mg/dL (ref 0.3–1.2)
Total Protein: 5.5 g/dL — ABNORMAL LOW (ref 6.5–8.1)

## 2018-06-23 LAB — C-REACTIVE PROTEIN: CRP: 2 mg/dL — ABNORMAL HIGH (ref <1.0)

## 2018-06-23 LAB — MAGNESIUM: Magnesium: 3 mg/dL — ABNORMAL HIGH (ref 1.7–2.4)

## 2018-06-23 LAB — CULTURE, BLOOD (ROUTINE X 2)
Culture: NO GROWTH
Culture: NO GROWTH
Special Requests: ADEQUATE
Special Requests: ADEQUATE

## 2018-06-23 LAB — GLUCOSE, CAPILLARY
Glucose-Capillary: 101 mg/dL — ABNORMAL HIGH (ref 70–99)
Glucose-Capillary: 99 mg/dL (ref 70–99)
Glucose-Capillary: 109 mg/dL — ABNORMAL HIGH (ref 70–99)

## 2018-06-23 LAB — D-DIMER, QUANTITATIVE: D-Dimer, Quant: 2.16 ug/mL-FEU — ABNORMAL HIGH (ref 0.00–0.50)

## 2018-06-23 LAB — FERRITIN: Ferritin: 883 ng/mL — ABNORMAL HIGH (ref 24–336)

## 2018-06-23 NOTE — Progress Notes (Signed)
phonecall & facetime call to pts saughter Katharine Look. Update provided

## 2018-06-23 NOTE — Progress Notes (Signed)
  Speech Language Pathology Treatment: Dysphagia  Patient Details Name: MOISE FRIDAY MRN: [id number] DOB: [birth year] Today's Date: 06/23/2018 Time: 1320-1340 SLP Time Calculation (min) (ACUTE ONLY): 20 min  Assessment / Plan / Recommendation Clinical Impression  Pt seen with lunch meal, able to masticate chicken and softer solids without difficulty, though he needed total assist for feeding. He could not attend to meal for self feeding despite assist. At times he refused certain foods. No coughing or signs of aspiration observed. Current diet with assist is appropriate. No further skilled interventions needed at this time. Will sign off.   HPI HPI: Pt is an 85 yo male admitted from [facility] with PNA, found to be positive for COVID-19. PMH includes dementia, afib, stroke, HTN, and CAD.      SLP Plan  All goals met       Recommendations  Diet recommendations: Thin liquid;Dysphagia 3 (mechanical soft) Liquids provided via: Cup;Straw Medication Administration: Crushed with puree Supervision: Staff to assist with self feeding;Full supervision/cueing for compensatory strategies Compensations: Slow rate;Small sips/bites;Minimize environmental distractions Postural Changes and/or Swallow Maneuvers: Seated upright 90 degrees                Oral Care Recommendations: Oral care BID Follow up Recommendations: 24 hour supervision/assistance Plan: All goals met       GO               Herbie Baltimore, MA CCC-SLP  Acute Rehabilitation Services Pager [phone] Office [phone]  Lynann Beaver 06/23/2018, 2:40 PM

## 2018-06-23 NOTE — Evaluation (Signed)
Physical Therapy Evaluation Patient Details Name: Marvin Wagner MRN: [id number] DOB: 1930-08-28 Today's Date: 06/23/2018   History of Present Illness  85 y.o. male admitted 06/19/18 due to fever and confusion.  Pt dx with COVID after second test (initial test was negative) came back positive. Pt also with LFT elevation, AKI, hypernatremia, hypokalemia, acute metabolic encephalopathy.  Pt with other significant PMH of HTN, CAD, aortic stenosis, dementia, and aortic valve replacement.    Clinical Impression  Pt was able to sit EOB with me responding well to music and patient coaxing, backing off when pt responded negatively and re-directing (this would usually happen when I attempted to touch his LEs).  I was unable to get his daughter, Katharine Look, on the phone to get a better sense of what he was doing at baseline.  He is appropriate for SNF placement, but some ALFs will take dependent patients back.  More information is needed to determine d/c plan.   PT to follow acutely for deficits listed below.      Follow Up Recommendations SNF;Other (comment)(vs return to ALF, need more info)    [facility] bed;Other (comment)(hoyer lift)    Recommendations for Other Services   NA    Precautions / Restrictions Precautions Precautions: Fall Restrictions Weight Bearing Restrictions: No      Mobility  Bed Mobility Overal bed mobility: Needs Assistance Bed Mobility: Supine to Sit;Sit to Supine     Supine to sit: Max assist;HOB elevated Sit to supine: Max assist;HOB elevated   General bed mobility comments: Max assist and significant coaxing to move to EOB.  Therapist sat EOB with music playing reaching hands to pt with simple cues to sit up.  Pt pulling strongly with bil UEs, but continues to be posterior through transitions.   Transfers                 General transfer comment: Unable to safely attempt  Ambulation/Gait             General Gait Details:  unsure if he was ambulatory         Balance Overall balance assessment: Needs assistance Sitting-balance support: Feet supported;Bilateral upper extremity supported;No upper extremity supported;Single extremity supported Sitting balance-Leahy Scale: Zero Sitting balance - Comments: max assist EOB due to significant poserior preference.  Postural control: Posterior lean                                   Pertinent Vitals/Pain Pain Assessment: Faces Faces Pain Scale: Hurts even more Pain Location: he is very sensative to touch of his LEs Pain Descriptors / Indicators: Grimacing;Guarding Pain Intervention(s): Limited activity within patient's tolerance;Monitored during session;Repositioned    Home Living Family/patient expects to be discharged to:: Unsure                 Additional Comments: I was unable to reach his daughter, Katharine Look on the phone to get PLOF.     Prior Function                    Extremity/Trunk Assessment   Upper Extremity Assessment Upper Extremity Assessment: Defer to OT evaluation    Lower Extremity Assessment Lower Extremity Assessment: Generalized weakness(difficult to assess due to cognition, muscle wasting noted)    Cervical / Trunk Assessment Cervical / Trunk Assessment: Kyphotic  Communication   Communication: Other (comment)(mumbles, not much sensical, will hum/sing along to  music)  Cognition Arousal/Alertness: Awake/alert Behavior During Therapy: Restless Overall Cognitive Status: History of cognitive impairments - at baseline                                 General Comments: Not sure if this is baseline or worse than baseline.  From memory care unit      General Comments General comments (skin integrity, edema, etc.): Pt was bradycardic at times.  upper 40s-50s up to 70s.  BP elevated (but that has been the trend all AM and he has h/o HTN).     Exercises Other Exercises Other Exercises: tapped  toes to music, danced with arms in the bed with therapist to music and swayed EOB to music.  Very positively responsive to [place] and [place]ley Spittle.    Assessment/Plan    PT Assessment Patient needs continued PT services  PT Problem List Decreased strength;Decreased range of motion;Decreased activity tolerance;Decreased balance;Decreased mobility;Decreased cognition;Decreased knowledge of use of DME;Decreased safety awareness;Cardiopulmonary status limiting activity       PT Treatment Interventions DME instruction;Gait training;Functional mobility training;Therapeutic activities;Therapeutic exercise;Balance training;Cognitive remediation;Neuromuscular re-education;Patient/family education;Wheelchair mobility training    PT Goals (Current goals can be found in the Care Plan section)  Acute Rehab PT Goals Patient Stated Goal: unable to state, agreeable for me to return to "dance" with him.  PT Goal Formulation: Patient unable to participate in goal setting Time For Goal Achievement: 07/07/18 Potential to Achieve Goals: Fair    Frequency Min 3X/week           AM-PAC PT "6 Clicks" Mobility  Outcome Measure Help needed turning from your back to your side while in a flat bed without using bedrails?: Total Help needed moving from lying on your back to sitting on the side of a flat bed without using bedrails?: Total Help needed moving to and from a bed to a chair (including a wheelchair)?: Total Help needed standing up from a chair using your arms (e.g., wheelchair or bedside chair)?: Total Help needed to walk in hospital room?: Total Help needed climbing 3-5 steps with a railing? : Total 6 Click Score: 6    End of Session   Activity Tolerance: Patient limited by fatigue Patient left: in bed;with call bell/phone within reach;with bed alarm set Nurse Communication: Mobility status PT Visit Diagnosis: Muscle weakness (generalized) (M62.81);Difficulty in walking, not elsewhere  classified (R26.2)    Time: 6004-5997 PT Time Calculation (min) (ACUTE ONLY): 28 min   Charges:         Wells Guiles B. Harly Pipkins, PT, DPT  Acute Rehabilitation #[phone] pager #[phone] office    PT Evaluation $PT Eval Moderate Complexity: 1 Mod PT Treatments $Therapeutic Activity: 8-22 mins       06/23/2018, 10:05 AM

## 2018-06-23 NOTE — Progress Notes (Signed)
PROGRESS NOTE  Marvin Wagner  MOQ:[id number] DOB: 08/15/1930 DOA: 06/18/2018 PCP: Altamese Dilling, medical director of the [facility]) or [facility].   Brief Narrative: Marvin Wagner is an 85 y.o. symptoms with a history of dementia, AFib, HTN, and CAD who presented with confusion and fever found to have covid-19.  Assessment & Plan: Principal Problem:   Community acquired pneumonia Active Problems:   Hx of CABG   Dementia ([place])   Acute encephalopathy   CAP (community acquired pneumonia)  Covid-19 infection: Time course seems to have begun around 5/18. - Continue airborne, contact precautions. PPE including surgical gown, gloves, face shield, cap, shoe covers, and N-95 used during this encounter in a negative pressure room.  - Check daily labs: CBC w/diff, CMP, d-dimer, fibrinogen, ferritin, LDH, CRP - PCT negative. Stopped abx. - s/p actemra 5/20. CRP declining, respiratory status improving.  - Given steroids x3 days.   - Enoxaparin prophylactic dose.  - Blood cultures drawn.  - Avoid NSAIDs - Recommend proning and aggressive use of incentive spirometry. Pt not cooperative at this time. - Goals of care were discussed, pt is DNR. Prognosis is guarded.  LFT elevation: Could be due to covid. Up a bit today, will not repeat actemra dosing.  - Monitor daily  AKI: Creatinine up from baseline. Improving - BUN still elevated. Will give low rate IVF and decrease steroid dosing.  - Avoid nephrotoxins - Monitor UOP  Hypernatremia: Mild, slightly improving with IVF - Changed to D5W  Hypokalemia:  - Improved with supplementation, will continue.  Acute metabolic encephalopathy:  - Delirium precautions  Dysphagia: - SLP evaluation   Dementia:  - Continue home medications - Delirium precautions - Add ativan and continue risperdal for agitation  Chronic atrial fibrillation:  - Continue metoprolol.  Thrombocytopenia:  - Resolved  CAD: Minimally elevated  troponin due to demand ischemia at 0.03.  - Monitor  DVT prophylaxis: Lovenox Code Status: DNR Family Communication: Webb Silversmith, the patient's guardian, discussed by phone. Disposition Plan: Uncertain. Stable enough for PT/OT evaluations. Anticipate DC to SNF in 2 days if po intake improves and pulmonary status remains stable as we go through 2nd week of illness.  Consultants:   None  Procedures:   None  Antimicrobials:  Ceftriaxone, azithromycin   Subjective: Wagner new events, not eating much, breathing is stable. Wagner fevers.   Objective: Vitals:   06/23/18 0700 06/23/18 0800 06/23/18 0810 06/23/18 0925  BP: (!) 153/78 (!) 152/71 (!) 147/87 (!) 149/90  Pulse: 67 83 94 72  Resp:      Temp:   (!) 97.3 F (36.3 C)   TempSrc:   Axillary   SpO2: 95% 97% 96% 94%  Weight:      Height:        Intake/Output Summary (Last 24 hours) at 06/23/2018 1251 Last data filed at 06/23/2018 0654 Gross per 24 hour  Intake 222 ml  Output 750 ml  Net -528 ml   Filed Weights   06/18/18 2039  Weight: 81 kg   Gen: Frail, elderly male in Wagner distress Pulm: Nonlabored breathing room air. Clear. CV: Regular rate and rhythm. Wagner murmur, rub, or gallop. Wagner JVD, Wagner dependent edema. GI: Abdomen soft, non-tender, non-distended, with normoactive bowel sounds.  Ext: Warm, Wagner deformities Skin: Wagner rashes, lesions or ulcers on visualized skin. Neuro: Alert, disoriented, without focal neurological deficits. Psych: Judgement and insight appear impaired. Mood euthymic & affect congruent. Behavior is appropriate.    Data Reviewed: I  have personally reviewed following labs and imaging studies  CBC: Recent Labs  Lab 06/18/18 2101 06/19/18 0908 06/20/18 1015 06/21/18 0404 06/22/18 0600 06/23/18 0331  WBC 8.7 7.6 6.0 9.4 12.9* 12.5*  NEUTROABS 7.0  --  5.0 7.5 11.5* 10.6*  HGB 12.3* 11.8* 10.3* 11.0* 11.2* 10.7*  HCT 37.0* 36.3* 32.3* 33.9* 34.4* 33.0*  MCV 101.1* 103.7* 101.3* 100.0 99.1 100.0  PLT 145*  146* 155 186 227 937   Basic Metabolic Panel: Recent Labs  Lab 06/19/18 0908 06/20/18 1015 06/21/18 0404 06/22/18 0600 06/23/18 0331  NA 145 145 146* 148* 146*  K 3.8 4.0 3.8 3.4* 3.8  CL 111 114* 115* 115* 116*  CO2 27 24 21* 23 22  GLUCOSE 123* 190* 164* 148* 122*  BUN 31* 29* 40* 47* 39*  CREATININE 1.17 1.05 0.95 0.96 0.91  CALCIUM 7.8* 7.4* 7.8* 8.0* 7.9*  MG  --   --  3.0* 3.2* 3.0*   GFR: Estimated Creatinine Clearance: 65.1 mL/min (by C-G formula based on SCr of 0.91 mg/dL). Liver Function Tests: Recent Labs  Lab 06/18/18 2101 06/20/18 1015 06/21/18 0404 06/22/18 0600 06/23/18 0331  AST 76* 65* 83* 67* 95*  ALT 38 41 59* 64* 88*  ALKPHOS 65 54 58 66 69  BILITOT 0.9 0.6 0.6 0.7 0.7  PROT 6.5 5.9* 6.0* 6.1* 5.5*  ALBUMIN 2.9* 2.5* 2.5* 2.7* 2.5*   Wagner results for input(s): LIPASE, AMYLASE in the last 168 hours. Recent Labs  Lab 06/19/18 0908  AMMONIA 19   Coagulation Profile: Recent Labs  Lab 06/19/18 1500  INR 1.2   Cardiac Enzymes: Recent Labs  Lab 06/18/18 2101 06/19/18 0908 06/20/18 0915  TROPONINI 0.04* 0.04* 0.03*   BNP (last 3 results) Wagner results for input(s): PROBNP in the last 8760 hours. HbA1C: Wagner results for input(s): HGBA1C in the last 72 hours. CBG: Recent Labs  Lab 06/22/18 0606 06/22/18 1026 06/22/18 1244 06/22/18 2341 06/23/18 0811  GLUCAP 143* 144* 148* 147* 101*   Lipid Profile: Wagner results for input(s): CHOL, HDL, LDLCALC, TRIG, CHOLHDL, LDLDIRECT in the last 72 hours. Thyroid Function Tests: Wagner results for input(s): TSH, T4TOTAL, FREET4, T3FREE, THYROIDAB in the last 72 hours. Anemia Panel: Recent Labs    06/22/18 0600 06/23/18 0331  FERRITIN 1,143* 883*   Urine analysis:    Component Value Date/Time   COLORURINE AMBER (A) 06/18/2018 2048   APPEARANCEUR CLOUDY (A) 06/18/2018 2048   LABSPEC 1.031 (H) 06/18/2018 2048   PHURINE 5.0 06/18/2018 2048   GLUCOSEU NEGATIVE 06/18/2018 2048   HGBUR SMALL (A)  06/18/2018 2048   BILIRUBINUR NEGATIVE 06/18/2018 2048   BILIRUBINUR neg 02/08/2017 1149   KETONESUR NEGATIVE 06/18/2018 2048   PROTEINUR 100 (A) 06/18/2018 2048   UROBILINOGEN 0.2 02/08/2017 1149   UROBILINOGEN 0.2 12/15/2008 1405   NITRITE NEGATIVE 06/18/2018 2048   LEUKOCYTESUR NEGATIVE 06/18/2018 2048   Recent Results (from the past 240 hour(s))  Urine culture     Status: None   Collection Time: 06/18/18  8:48 PM  Result Value Ref Range Status   Specimen Description   Final    URINE, RANDOM Performed at [facility], [place] [address], [place], [place] [address]    Special Requests   Final    NONE Performed at [facility], [place] [address] [place], [place] [address]    Culture   Final    Wagner GROWTH Performed at [facility], [place] [address], [place], [place] [address]  Report Status 06/19/2018 FINAL  Final  SARS Coronavirus 2 (CEPHEID- Performed in [place] hospital lab), Hosp Order     Status: None   Collection Time: 06/18/18  8:53 PM  Result Value Ref Range Status   SARS Coronavirus 2 NEGATIVE NEGATIVE Final    Comment: (NOTE) If result is NEGATIVE SARS-CoV-2 target nucleic acids are NOT DETECTED. The SARS-CoV-2 RNA is generally detectable in upper and lower  respiratory specimens during the acute phase of infection. The lowest  concentration of SARS-CoV-2 viral copies this assay can detect is 250  copies / mL. A negative result does not preclude SARS-CoV-2 infection  and should not be used as the sole basis for treatment or other  patient management decisions.  A negative result may occur with  improper specimen collection / handling, submission of specimen other  than nasopharyngeal swab, presence of viral mutation(s) within the  areas targeted by this assay, and inadequate number of viral copies  (<250 copies / mL). A negative result must be combined with clinical  observations, patient history, and epidemiological  information. If result is POSITIVE SARS-CoV-2 target nucleic acids are DETECTED. The SARS-CoV-2 RNA is generally detectable in upper and lower  respiratory specimens dur ing the acute phase of infection.  Positive  results are indicative of active infection with SARS-CoV-2.  Clinical  correlation with patient history and other diagnostic information is  necessary to determine patient infection status.  Positive results do  not rule out bacterial infection or co-infection with other viruses. If result is PRESUMPTIVE POSTIVE SARS-CoV-2 nucleic acids MAY BE PRESENT.   A presumptive positive result was obtained on the submitted specimen  and confirmed on repeat testing.  While 2019 novel coronavirus  (SARS-CoV-2) nucleic acids may be present in the submitted sample  additional confirmatory testing may be necessary for epidemiological  and / or clinical management purposes  to differentiate between  SARS-CoV-2 and other Sarbecovirus currently known to infect humans.  If clinically indicated additional testing with an alternate test  methodology [phone]) is advised. The SARS-CoV-2 RNA is generally  detectable in upper and lower respiratory sp ecimens during the acute  phase of infection. The expected result is Negative. Fact Sheet for Patients:  StrictlyIdeas.Wagner Fact Sheet for Healthcare Providers: BankingDealers.co.za This test is not yet approved or cleared by the [place] FDA and has been authorized for detection and/or diagnosis of SARS-CoV-2 by FDA under an Emergency Use Authorization (EUA).  This EUA will remain in effect (meaning this test can be used) for the duration of the COVID-19 declaration under Section 564(b)(1) of the Act, 21 U.S.C. section 360bbb-3(b)(1), unless the authorization is terminated or revoked sooner. Performed at [facility], [place] [address] [place], [place] [address]   Blood Culture  (routine x 2)     Status: None (Preliminary result)   Collection Time: 06/18/18  9:01 PM  Result Value Ref Range Status   Specimen Description   Final    BLOOD LEFT ARM Performed at [place] [address] [place], [place] [address]    Special Requests   Final    BOTTLES DRAWN AEROBIC AND ANAEROBIC Blood Culture adequate volume Performed at [place] [address], [place], [place] [address]    Culture   Final    Wagner GROWTH 4 DAYS Performed at [facility], [place] [address] [place], [place] [address]    Report Status PENDING  Incomplete  Blood Culture (routine x  2)     Status: None (Preliminary result)   Collection Time: 06/18/18  9:01 PM  Result Value Ref Range Status   Specimen Description   Final    BLOOD RIGHT ARM Performed at [place] [address], [place], [place] [address]    Special Requests   Final    BOTTLES DRAWN AEROBIC AND ANAEROBIC Blood Culture adequate volume Performed at [place] [address], [place], [place] [address]    Culture   Final    Wagner GROWTH 4 DAYS Performed at [facility], [place] [address], [place], [place] [address]    Report Status PENDING  Incomplete  MRSA PCR Screening     Status: None   Collection Time: 06/19/18  2:53 AM  Result Value Ref Range Status   MRSA by PCR NEGATIVE NEGATIVE Final    Comment:        The GeneXpert MRSA Assay (FDA approved for NASAL specimens only), is one component of a comprehensive MRSA colonization surveillance program. It is not intended to diagnose MRSA infection nor to guide or monitor treatment for MRSA infections. Performed at [facility], [place] [address], [place], [place] [address]   SARS Coronavirus 2 (CEPHEID- Performed in [place] hospital lab), Hosp Order     Status: Abnormal   Collection Time: 06/19/18  6:15 AM  Result Value Ref Range Status   SARS Coronavirus 2  POSITIVE (A) NEGATIVE Final    Comment: RESULT CALLED TO, READ BACK BY AND VERIFIED WITH: NELSON,A. RN @0809  ON 5.19.2020 BY [email or web address] (NOTE) If result is NEGATIVE SARS-CoV-2 target nucleic acids are NOT DETECTED. The SARS-CoV-2 RNA is generally detectable in upper and lower  respiratory specimens during the acute phase of infection. The lowest  concentration of SARS-CoV-2 viral copies this assay can detect is 250  copies / mL. A negative result does not preclude SARS-CoV-2 infection  and should not be used as the sole basis for treatment or other  patient management decisions.  A negative result may occur with  improper specimen collection / handling, submission of specimen other  than nasopharyngeal swab, presence of viral mutation(s) within the  areas targeted by this assay, and inadequate number of viral copies  (<250 copies / mL). A negative result must be combined with clinical  observations, patient history, and epidemiological information. If result is POSITIVE SARS-CoV-2 target nucleic acids are DETECT ED. The SARS-CoV-2 RNA is generally detectable in upper and lower  respiratory specimens during the acute phase of infection.  Positive  results are indicative of active infection with SARS-CoV-2.  Clinical  correlation with patient history and other diagnostic information is  necessary to determine patient infection status.  Positive results do  not rule out bacterial infection or co-infection with other viruses. If result is PRESUMPTIVE POSTIVE SARS-CoV-2 nucleic acids MAY BE PRESENT.   A presumptive positive result was obtained on the submitted specimen  and confirmed on repeat testing.  While 2019 novel coronavirus  (SARS-CoV-2) nucleic acids may be present in the submitted sample  additional confirmatory testing may be necessary for epidemiological  and / or clinical management purposes  to differentiate between  SARS-CoV-2 and other Sarbecovirus currently known to infect  humans.  If clinically indicated additional testing with an alternate test  methodology ([id number]) is advised. The SARS-CoV-2 RNA is generally  detectable in upper and lower respiratory specimens during the acute  phase of infection. The expected result is Negative.  Fact Sheet for Patients:  StrictlyIdeas.Wagner Fact Sheet for Healthcare Providers: BankingDealers.co.za This test is not yet approved or cleared by the [place] FDA and has been authorized for detection and/or diagnosis of SARS-CoV-2 by FDA under an Emergency Use Authorization (EUA).  This EUA will remain in effect (meaning this test can be used) for the duration of the COVID-19 declaration under Section 564(b)(1) of the Act, 21 U.S.C. section 360bbb-3(b)(1), unless the authorization is terminated or revoked sooner. Performed at [facility], [place] [address] [place], [place] [address]   Respiratory Panel by PCR     Status: None   Collection Time: 06/21/18  3:15 PM  Result Value Ref Range Status   Adenovirus NOT DETECTED NOT DETECTED Final   Coronavirus 229E NOT DETECTED NOT DETECTED Final    Comment: (NOTE) The Coronavirus on the Respiratory Panel, DOES NOT test for the novel  Coronavirus (2019 nCoV)    Coronavirus HKU1 NOT DETECTED NOT DETECTED Final   Coronavirus NL63 NOT DETECTED NOT DETECTED Final   Coronavirus OC43 NOT DETECTED NOT DETECTED Final   Metapneumovirus NOT DETECTED NOT DETECTED Final   Rhinovirus / Enterovirus NOT DETECTED NOT DETECTED Final   Influenza A NOT DETECTED NOT DETECTED Final   Influenza B NOT DETECTED NOT DETECTED Final   Parainfluenza Virus 1 NOT DETECTED NOT DETECTED Final   Parainfluenza Virus 2 NOT DETECTED NOT DETECTED Final   Parainfluenza Virus 3 NOT DETECTED NOT DETECTED Final   Parainfluenza Virus 4 NOT DETECTED NOT DETECTED Final   Respiratory Syncytial Virus NOT DETECTED NOT DETECTED Final   Bordetella  pertussis NOT DETECTED NOT DETECTED Final   Chlamydophila pneumoniae NOT DETECTED NOT DETECTED Final   Mycoplasma pneumoniae NOT DETECTED NOT DETECTED Final    Comment: Performed at [facility], [place]. [address] [place], [place] [address]      Radiology Studies: Wagner results found.  Scheduled Meds: . aspirin EC  81 mg Oral Daily  . atorvastatin  40 mg Oral Daily  . donepezil  10 mg Oral QHS  . enoxaparin (LOVENOX) injection  40 mg Subcutaneous Q24H  . mouth rinse  15 mL Mouth Rinse BID  . Melatonin  1 mg Oral QHS  . memantine  10 mg Oral BID  . metoprolol tartrate  12.5 mg Oral Daily   Continuous Infusions: . dextrose 5 % with KCl 20 mEq / L 20 mEq (06/22/18 1651)     LOS: 4 days   Time spent: 25 minutes.  Patrecia Pour, MD [facility] [email or web address] Password [facility] 06/23/2018, 12:51 PM

## 2018-06-24 DIAGNOSIS — Z951 Presence of aortocoronary bypass graft: Secondary | ICD-10-CM

## 2018-06-24 LAB — CBC WITH DIFFERENTIAL/PLATELET
Abs Immature Granulocytes: 0.15 10*3/uL — ABNORMAL HIGH (ref 0.00–0.07)
Basophils Absolute: 0 10*3/uL (ref 0.0–0.1)
Basophils Relative: 0 %
Eosinophils Absolute: 0 10*3/uL (ref 0.0–0.5)
Eosinophils Relative: 0 %
HCT: 35.4 % — ABNORMAL LOW (ref 39.0–52.0)
Hemoglobin: 12.2 g/dL — ABNORMAL LOW (ref 13.0–17.0)
Immature Granulocytes: 2 %
Lymphocytes Relative: 15 %
Lymphs Abs: 1.2 10*3/uL (ref 0.7–4.0)
MCH: 33.5 pg (ref 26.0–34.0)
MCHC: 34.5 g/dL (ref 30.0–36.0)
MCV: 97.3 fL (ref 80.0–100.0)
Monocytes Absolute: 0.3 10*3/uL (ref 0.1–1.0)
Monocytes Relative: 4 %
Neutro Abs: 6.5 10*3/uL (ref 1.7–7.7)
Neutrophils Relative %: 79 %
Platelets: 205 10*3/uL (ref 150–400)
RBC: 3.64 MIL/uL — ABNORMAL LOW (ref 4.22–5.81)
RDW: 12.4 % (ref 11.5–15.5)
WBC: 8.2 10*3/uL (ref 4.0–10.5)
nRBC: 0.4 % — ABNORMAL HIGH (ref 0.0–0.2)

## 2018-06-24 LAB — GLUCOSE, CAPILLARY
Glucose-Capillary: 103 mg/dL — ABNORMAL HIGH (ref 70–99)
Glucose-Capillary: 118 mg/dL — ABNORMAL HIGH (ref 70–99)
Glucose-Capillary: 98 mg/dL (ref 70–99)

## 2018-06-24 LAB — COMPREHENSIVE METABOLIC PANEL
ALT: 112 U/L — ABNORMAL HIGH (ref 0–44)
AST: 96 U/L — ABNORMAL HIGH (ref 15–41)
Albumin: 2.7 g/dL — ABNORMAL LOW (ref 3.5–5.0)
Alkaline Phosphatase: 70 U/L (ref 38–126)
Anion gap: 8 (ref 5–15)
BUN: 26 mg/dL — ABNORMAL HIGH (ref 8–23)
CO2: 24 mmol/L (ref 22–32)
Calcium: 7.9 mg/dL — ABNORMAL LOW (ref 8.9–10.3)
Chloride: 110 mmol/L (ref 98–111)
Creatinine, Ser: 0.91 mg/dL (ref 0.61–1.24)
GFR calc Af Amer: >60 mL/min (ref >60)
GFR calc non Af Amer: >60 mL/min (ref >60)
Glucose, Bld: 101 mg/dL — ABNORMAL HIGH (ref 70–99)
Potassium: 3.6 mmol/L (ref 3.5–5.1)
Sodium: 142 mmol/L (ref 135–145)
Total Bilirubin: 0.6 mg/dL (ref 0.3–1.2)
Total Protein: 5.8 g/dL — ABNORMAL LOW (ref 6.5–8.1)

## 2018-06-24 LAB — FERRITIN: Ferritin: 659 ng/mL — ABNORMAL HIGH (ref 24–336)

## 2018-06-24 LAB — C-REACTIVE PROTEIN: CRP: 0.9 mg/dL (ref <1.0)

## 2018-06-24 LAB — D-DIMER, QUANTITATIVE: D-Dimer, Quant: 3.33 ug/mL-FEU — ABNORMAL HIGH (ref 0.00–0.50)

## 2018-06-24 LAB — MAGNESIUM: Magnesium: 2.5 mg/dL — ABNORMAL HIGH (ref 1.7–2.4)

## 2018-06-24 NOTE — Progress Notes (Addendum)
PROGRESS NOTE                                                                                                                                                                                                             Patient Demographics:    Marvin Wagner, is a 85 y.o. male, DOB - 10/28/1930, ZOX:[id number]  Outpatient Primary MD for the patient is Burchette, Alinda Sierras, MD    LOS - 5  Chief Complaint  Patient presents with   Shortness of Breath       Brief Narrative: Patient is a 85 y.o. male with PMHx of dementia, AFib, HTN, and CAD who presented with confusion and fever found to have covid-19.   Subjective:    Marvin Wagner today remains confused-although pleasantly.  No major events overnight   Assessment  & Plan :   COVID-19 viral pneumonia: Improved-now on room air.  Inflammatory markers have improved significantly.  Patient is s/p Actemra and Solu-Medrol during the earlier part of his hospital stay.  Continue supportive care  COVID-19 Labs:  Recent Labs    06/22/18 0600 06/23/18 0331 06/24/18 0656  DDIMER 1.72* 2.16* 3.33*  FERRITIN 1,143* 883* 659*  CRP 4.4* 2.0* 0.9    Lab Results  Component Value Date   SARSCOV2NAA POSITIVE (A) 06/19/2018   [place] NEGATIVE 06/18/2018     COVID-19 Medications: 5/20>> Actemra 5/19>> 5/22>> Solu-Medrol  Transaminitis: Probably secondary to COVID-19 or from Actemra-slowly improving-follow periodically.  AKI: Resolved-likely hemodynamically mediated  Hypernatremia: Resolved with hypotonic saline.  Acute metabolic encephalopathy: Secondary to COVID-19 infection-improved with supportive care.  Has dementia at baseline-suspect back to usual baseline.  Chronic atrial fibrillation: Rate controlled with metoprolol.  Not a candidate for anticoagulation.  CAD: No anginal symptoms-continue aspirin/metoprolol and statin  Dementia with delirium: Pleasantly  confused-suspect not far from baseline-continue Aricept/Namenda and Risperdal  Dysphagia: Probably related to dementia-evaluated by SLP-continue dysphagia 3 diet    ABG:    Component Value Date/Time   PHART 7.371 12/17/2008 2024   PCO2ART 42.2 12/17/2008 2024   PO2ART 162.0 (H) 12/17/2008 2024   HCO3 24.2 (H) 12/17/2008 2024   TCO2 22 12/17/2008 2028   ACIDBASEDEF 1.0 12/17/2008 2024   O2SAT 99.0 12/17/2008 2024    Condition - stable  Family Communication  : Unable to reach daughter-spoke with son-in-law over the phone  Code Status :  DNR  Diet : Dysphagia 3 diet  Disposition Plan  :  Remain inpatient-SNF over the next few days  Consults  :  None  Procedures  :  None  DVT Prophylaxis  :  Lovenox   Lab Results  Component Value Date   PLT 205 06/24/2018    Inpatient Medications  Scheduled Meds:  aspirin EC  81 mg Oral Daily   atorvastatin  40 mg Oral Daily   donepezil  10 mg Oral QHS   enoxaparin (LOVENOX) injection  40 mg Subcutaneous Q24H   mouth rinse  15 mL Mouth Rinse BID   Melatonin  1 mg Oral QHS   memantine  10 mg Oral BID   metoprolol tartrate  12.5 mg Oral Daily   Continuous Infusions: PRN Meds:.acetaminophen **OR** acetaminophen, LORazepam, ondansetron **OR** ondansetron (ZOFRAN) IV, risperiDONE  Antibiotics  :    Anti-infectives (From admission, onward)   Start     Dose/Rate Route Frequency Ordered Stop   06/19/18 2000  cefTRIAXone (ROCEPHIN) 1 g in sodium chloride 0.9 % 100 mL IVPB  Status:  Discontinued     1 g 200 mL/hr over 30 Minutes Intravenous Every 24 hours 06/19/18 0014 06/20/18 1811   06/19/18 2000  azithromycin (ZITHROMAX) 500 mg in sodium chloride 0.9 % 250 mL IVPB  Status:  Discontinued     500 mg 250 mL/hr over 60 Minutes Intravenous Every 24 hours 06/19/18 0014 06/20/18 1811   06/18/18 2215  cefTRIAXone (ROCEPHIN) 1 g in sodium chloride 0.9 % 100 mL IVPB     1 g 200 mL/hr over 30 Minutes Intravenous  Once 06/18/18 2209  06/19/18 0006   06/18/18 2215  azithromycin (ZITHROMAX) 500 mg in sodium chloride 0.9 % 250 mL IVPB     500 mg 250 mL/hr over 60 Minutes Intravenous  Once 06/18/18 2209 06/19/18 0006       Time Spent in minutes  25   Oren Binet M.D on 06/24/2018 at 12:14 PM  To page go to [email or web address] - use universal password  [facility] -  Office  [phone]   See all Orders from today for further details   Admit date - 06/18/2018    5    Objective:   Vitals:   06/24/18 0526 06/24/18 0547 06/24/18 0618 06/24/18 0800  BP:  123/87    Pulse: 81 95  94  Resp:  (!) 26    Temp:   98.2 F (36.8 C) (!) 97.5 F (36.4 C)  TempSrc: Oral   Axillary  SpO2: 99% 100%  100%  Weight:      Height:        Wt Readings from Last 3 Encounters:  06/18/18 81 kg  09/21/17 79.4 kg  02/08/17 77.9 kg     Intake/Output Summary (Last 24 hours) at 06/24/2018 1214 Last data filed at 06/24/2018 0550 Gross per 24 hour  Intake --  Output 1350 ml  Net -1350 ml     Physical Exam Gen Exam: Pleasantly confused-not in any distress HEENT:atraumatic, normocephalic Chest: B/L clear to auscultation anteriorly CVS:S1S2 regular Abdomen:soft non tender, non distended Extremities:no edema Neurology: Has generalized weakness but nonfocal Skin: no rash   Data Review:    CBC Recent Labs  Lab 06/20/18 1015 06/21/18 0404 06/22/18 0600 06/23/18 0331 06/24/18 0656  WBC 6.0 9.4 12.9* 12.5* 8.2  HGB 10.3* 11.0* 11.2* 10.7* 12.2*  HCT 32.3* 33.9* 34.4* 33.0* 35.4*  PLT 155 186 227 220 205  MCV  101.3* 100.0 99.1 100.0 97.3  MCH 32.3 32.4 32.3 32.4 33.5  MCHC 31.9 32.4 32.6 32.4 34.5  RDW 13.3 12.7 12.5 12.6 12.4  LYMPHSABS 0.7 1.3 0.7 0.9 1.2  MONOABS 0.2 0.5 0.6 0.7 0.3  EOSABS 0.0 0.0 0.0 0.0 0.0  BASOSABS 0.0 0.0 0.0 0.0 0.0    Chemistries  Recent Labs  Lab 06/20/18 1015 06/21/18 0404 06/22/18 0600 06/23/18 0331 06/24/18 0656  NA 145 146* 148* 146* 142  K 4.0 3.8 3.4* 3.8 3.6   CL 114* 115* 115* 116* 110  CO2 24 21* 23 22 24   GLUCOSE 190* 164* 148* 122* 101*  BUN 29* 40* 47* 39* 26*  CREATININE 1.05 0.95 0.96 0.91 0.91  CALCIUM 7.4* 7.8* 8.0* 7.9* 7.9*  MG  --  3.0* 3.2* 3.0* 2.5*  AST 65* 83* 67* 95* 96*  ALT 41 59* 64* 88* 112*  ALKPHOS 54 58 66 69 70  BILITOT 0.6 0.6 0.7 0.7 0.6   ------------------------------------------------------------------------------------------------------------------ No results for input(s): CHOL, HDL, LDLCALC, TRIG, CHOLHDL, LDLDIRECT in the last 72 hours.  Lab Results  Component Value Date   HGBA1C  12/15/2008    5.3 (NOTE) The ADA recommends the following therapeutic goal for glycemic control related to Hgb A1c measurement: Goal of therapy: <6.5 Hgb A1c  Reference: American Diabetes Association: Clinical Practice Recommendations 2010, Diabetes Care, 2010, 33: (Suppl  1).   ------------------------------------------------------------------------------------------------------------------ No results for input(s): TSH, T4TOTAL, T3FREE, THYROIDAB in the last 72 hours.  Invalid input(s): FREET3 ------------------------------------------------------------------------------------------------------------------ Recent Labs    06/23/18 0331 06/24/18 0656  FERRITIN 883* 659*    Coagulation profile Recent Labs  Lab 06/19/18 1500  INR 1.2    Recent Labs    06/23/18 0331 06/24/18 0656  DDIMER 2.16* 3.33*    Cardiac Enzymes Recent Labs  Lab 06/18/18 2101 06/19/18 0908 06/20/18 0915  TROPONINI 0.04* 0.04* 0.03*   ------------------------------------------------------------------------------------------------------------------    Component Value Date/Time   BNP 175.0 (H) 01/14/2017 1058    Micro Results Recent Results (from the past 240 hour(s))  Urine culture     Status: None   Collection Time: 06/18/18  8:48 PM  Result Value Ref Range Status   Specimen Description   Final    URINE, RANDOM Performed at  [facility], [place] [address] [place], [place] [address]    Special Requests   Final    NONE Performed at [facility], [place] [address], [place], [place] [address]    Culture   Final    NO GROWTH Performed at [facility], [place] [address], [place], [place] [address]    Report Status 06/19/2018 FINAL  Final  SARS Coronavirus 2 (CEPHEID- Performed in [place] hospital lab), Hosp Order     Status: None   Collection Time: 06/18/18  8:53 PM  Result Value Ref Range Status   SARS Coronavirus 2 NEGATIVE NEGATIVE Final    Comment: (NOTE) If result is NEGATIVE SARS-CoV-2 target nucleic acids are NOT DETECTED. The SARS-CoV-2 RNA is generally detectable in upper and lower  respiratory specimens during the acute phase of infection. The lowest  concentration of SARS-CoV-2 viral copies this assay can detect is 250  copies / mL. A negative result does not preclude SARS-CoV-2 infection  and should not be used as the sole basis for treatment or other  patient management decisions.  A negative result may occur with  improper specimen collection / handling, submission of specimen other  than nasopharyngeal swab,  presence of viral mutation(s) within the  areas targeted by this assay, and inadequate number of viral copies  (<250 copies / mL). A negative result must be combined with clinical  observations, patient history, and epidemiological information. If result is POSITIVE SARS-CoV-2 target nucleic acids are DETECTED. The SARS-CoV-2 RNA is generally detectable in upper and lower  respiratory specimens dur ing the acute phase of infection.  Positive  results are indicative of active infection with SARS-CoV-2.  Clinical  correlation with patient history and other diagnostic information is  necessary to determine patient infection status.  Positive results do  not rule out bacterial infection or co-infection with other viruses. If result is PRESUMPTIVE  POSTIVE SARS-CoV-2 nucleic acids MAY BE PRESENT.   A presumptive positive result was obtained on the submitted specimen  and confirmed on repeat testing.  While 2019 novel coronavirus  (SARS-CoV-2) nucleic acids may be present in the submitted sample  additional confirmatory testing may be necessary for epidemiological  and / or clinical management purposes  to differentiate between  SARS-CoV-2 and other Sarbecovirus currently known to infect humans.  If clinically indicated additional testing with an alternate test  methodology [phone]) is advised. The SARS-CoV-2 RNA is generally  detectable in upper and lower respiratory sp ecimens during the acute  phase of infection. The expected result is Negative. Fact Sheet for Patients:  StrictlyIdeas.no Fact Sheet for Healthcare Providers: BankingDealers.co.za This test is not yet approved or cleared by the [place] FDA and has been authorized for detection and/or diagnosis of SARS-CoV-2 by FDA under an Emergency Use Authorization (EUA).  This EUA will remain in effect (meaning this test can be used) for the duration of the COVID-19 declaration under Section 564(b)(1) of the Act, 21 U.S.C. section 360bbb-3(b)(1), unless the authorization is terminated or revoked sooner. Performed at [facility], [place] [address], [place], [place] [address]   Blood Culture (routine x 2)     Status: None   Collection Time: 06/18/18  9:01 PM  Result Value Ref Range Status   Specimen Description   Final    BLOOD LEFT ARM Performed at [place] [address], [place], [place] [address]    Special Requests   Final    BOTTLES DRAWN AEROBIC AND ANAEROBIC Blood Culture adequate volume Performed at [place] [address] [place], [place] [address]    Culture   Final    NO GROWTH 5 DAYS Performed at [facility], [place] [address]  [place], [place] [address]    Report Status 06/23/2018 FINAL  Final  Blood Culture (routine x 2)     Status: None   Collection Time: 06/18/18  9:01 PM  Result Value Ref Range Status   Specimen Description   Final    BLOOD RIGHT ARM Performed at [place] [address] [place], [place] [address]    Special Requests   Final    BOTTLES DRAWN AEROBIC AND ANAEROBIC Blood Culture adequate volume Performed at [place] [address], [place], [place] [address]    Culture   Final    NO GROWTH 5 DAYS Performed at [facility], [place] [address], [place], [place] [address]    Report Status 06/23/2018 FINAL  Final  MRSA PCR Screening     Status: None   Collection Time: 06/19/18  2:53 AM  Result Value Ref Range Status   MRSA by PCR NEGATIVE NEGATIVE Final  Comment:        The GeneXpert MRSA Assay (FDA approved for NASAL specimens only), is one component of a comprehensive MRSA colonization surveillance program. It is not intended to diagnose MRSA infection nor to guide or monitor treatment for MRSA infections. Performed at [facility], [place] [address], [place], [place] [address]   SARS Coronavirus 2 (CEPHEID- Performed in [place] hospital lab), Hosp Order     Status: Abnormal   Collection Time: 06/19/18  6:15 AM  Result Value Ref Range Status   SARS Coronavirus 2 POSITIVE (A) NEGATIVE Final    Comment: RESULT CALLED TO, READ BACK BY AND VERIFIED WITH: NELSON,A. RN @0809  ON 5.19.2020 BY NMCCOY (NOTE) If result is NEGATIVE SARS-CoV-2 target nucleic acids are NOT DETECTED. The SARS-CoV-2 RNA is generally detectable in upper and lower  respiratory specimens during the acute phase of infection. The lowest  concentration of SARS-CoV-2 viral copies this assay can detect is 250  copies / mL. A negative result does not preclude SARS-CoV-2 infection  and should not be used as the sole basis for treatment or other    patient management decisions.  A negative result may occur with  improper specimen collection / handling, submission of specimen other  than nasopharyngeal swab, presence of viral mutation(s) within the  areas targeted by this assay, and inadequate number of viral copies  (<250 copies / mL). A negative result must be combined with clinical  observations, patient history, and epidemiological information. If result is POSITIVE SARS-CoV-2 target nucleic acids are DETECT ED. The SARS-CoV-2 RNA is generally detectable in upper and lower  respiratory specimens during the acute phase of infection.  Positive  results are indicative of active infection with SARS-CoV-2.  Clinical  correlation with patient history and other diagnostic information is  necessary to determine patient infection status.  Positive results do  not rule out bacterial infection or co-infection with other viruses. If result is PRESUMPTIVE POSTIVE SARS-CoV-2 nucleic acids MAY BE PRESENT.   A presumptive positive result was obtained on the submitted specimen  and confirmed on repeat testing.  While 2019 novel coronavirus  (SARS-CoV-2) nucleic acids may be present in the submitted sample  additional confirmatory testing may be necessary for epidemiological  and / or clinical management purposes  to differentiate between  SARS-CoV-2 and other Sarbecovirus currently known to infect humans.  If clinically indicated additional testing with an alternate test  methodology ([id number]) is advised. The SARS-CoV-2 RNA is generally  detectable in upper and lower respiratory specimens during the acute  phase of infection. The expected result is Negative. Fact Sheet for Patients:  StrictlyIdeas.no Fact Sheet for Healthcare Providers: BankingDealers.co.za This test is not yet approved or cleared by the [place] FDA and has been authorized for detection and/or diagnosis of SARS-CoV-2  by FDA under an Emergency Use Authorization (EUA).  This EUA will remain in effect (meaning this test can be used) for the duration of the COVID-19 declaration under Section 564(b)(1) of the Act, 21 U.S.C. section 360bbb-3(b)(1), unless the authorization is terminated or revoked sooner. Performed at [facility], [place] [address] [place], [place] [address]   Respiratory Panel by PCR     Status: None   Collection Time: 06/21/18  3:15 PM  Result Value Ref Range Status   Adenovirus NOT DETECTED NOT DETECTED Final   Coronavirus 229E NOT DETECTED NOT DETECTED Final    Comment: (NOTE) The Coronavirus on the Respiratory Panel, DOES NOT test  for the novel  Coronavirus (2019 nCoV)    Coronavirus HKU1 NOT DETECTED NOT DETECTED Final   Coronavirus NL63 NOT DETECTED NOT DETECTED Final   Coronavirus OC43 NOT DETECTED NOT DETECTED Final   Metapneumovirus NOT DETECTED NOT DETECTED Final   Rhinovirus / Enterovirus NOT DETECTED NOT DETECTED Final   Influenza A NOT DETECTED NOT DETECTED Final   Influenza B NOT DETECTED NOT DETECTED Final   Parainfluenza Virus 1 NOT DETECTED NOT DETECTED Final   Parainfluenza Virus 2 NOT DETECTED NOT DETECTED Final   Parainfluenza Virus 3 NOT DETECTED NOT DETECTED Final   Parainfluenza Virus 4 NOT DETECTED NOT DETECTED Final   Respiratory Syncytial Virus NOT DETECTED NOT DETECTED Final   Bordetella pertussis NOT DETECTED NOT DETECTED Final   Chlamydophila pneumoniae NOT DETECTED NOT DETECTED Final   Mycoplasma pneumoniae NOT DETECTED NOT DETECTED Final    Comment: Performed at [facility], [place] [address] [place], [place] [address]    Radiology Reports Ct Head Wo Contrast  Result Date: 06/19/2018 CLINICAL DATA:  85 year old male with shortness of breath weakness, altered mental status. Fever of 101.4 F. EXAM: CT HEAD WITHOUT CONTRAST TECHNIQUE: Contiguous axial images were obtained from the base of the skull through the vertex without  intravenous contrast. COMPARISON:  Head CT 01/14/2017, MRI 12/26/2016 and earlier. FINDINGS: Brain: Stable cerebral volume. Stable ventricle size and configuration. No midline shift, mass effect, or evidence of intracranial mass lesion. No acute intracranial hemorrhage identified. Stable gray-white matter differentiation throughout the brain. No cortically based acute infarct identified. Vascular: Calcified atherosclerosis at the skull base. No suspicious intracranial vascular hyperdensity. Skull: No acute osseous abnormality identified. Sinuses/Orbits: Visualized paranasal sinuses and mastoids are stable and well pneumatized. Other: Stable orbit and scalp soft tissues. IMPRESSION: Stable non contrast CT appearance of the brain since 2018. No acute intracranial abnormality. Electronically Signed   By: Genevie Ann M.D.   On: 06/19/2018 00:24   Dg Chest Portable 1 View  Result Date: 06/18/2018 CLINICAL DATA:  85 year old male with recent pneumonia. Fever and shortness of breath. COVID-19 status is pending. EXAM: PORTABLE CHEST 1 VIEW COMPARISON:  01/14/2017 and earlier. FINDINGS: Portable AP semi upright view at 2042 hours. Ill-defined abnormal right upper lobe opacity near the hilum is new since 2018. there is also patchy confluent opacity at the right lateral costophrenic angle. There is mild superimposed asymmetric indistinct and interstitial opacity in both lungs. Chronic prosthetic cardiac valve. Stable cardiac size and mediastinal contours. Calcified aortic atherosclerosis. Visualized tracheal air column is within normal limits. No pneumothorax or pleural effusion. Negative visible bowel gas pattern. No acute osseous abnormality identified. IMPRESSION: Abnormal right greater than left pulmonary opacity most confluent in the right upper lobe opacity. Consider acute pneumonia versus viral/atypical respiratory infection. No pleural effusion. Electronically Signed   By: Genevie Ann M.D.   On: 06/18/2018 21:43

## 2018-06-24 NOTE — Evaluation (Signed)
Occupational Therapy Treatment Patient Details Name: Marvin BALEY MRN: [id number] DOB: 12/09/1930 Today's Date: 06/24/2018    History of present illness 85 y.o. male admitted 06/19/18 due to fever and confusion.  Pt dx with COVID after second test (initial test was negative) came back positive. Pt also with LFT elevation, AKI, hypernatremia, hypokalemia, acute metabolic encephalopathy.  Pt with other significant PMH of HTN, CAD, aortic stenosis, dementia, and aortic valve replacement.     OT comments  Upon arrival, pt sleeping in bed and waking to his name. Pt with decreased following of cues for grooming or self feeding and requires Max A. Pt requiring Max A for bed mobility to EOB and then maintained sitting at EOB with Min Guard A for safety. Pt reaching for recliner next to bed and required Mod A for stand pivot to recliner to eat breakfast in upright position. Pt presenting with fatigue and decreased cognition. Unable to reach pt's daughter, Katharine Look, on the phone to gather home information and PLOF; chart review showing pt was at a [facility]. Pt would benefit from further acute OT to facilitate safe dc. Recommend dc to SNF for further OT to optimize safety, independence with ADLs, and return to PLOF.     Follow Up Recommendations  SNF;Supervision/Assistance - 24 hour    Equipment Recommendations  Other (comment)(Defer to next venue)    Recommendations for Other Services PT consult    Precautions / Restrictions Precautions Precautions: Fall Restrictions Weight Bearing Restrictions: No       Mobility Bed Mobility Overal bed mobility: Needs Assistance Bed Mobility: Supine to Sit     Supine to sit: Max assist;HOB elevated     General bed mobility comments: Max cues. Max A to bring BLEs over EOB and then elevate trunk. Once at EOB, pt able to maintain sitting balance with MIn Guard A  Transfers Overall transfer level: Needs assistance Equipment used: 1 person hand  held assist Transfers: Sit to/from [facility] Sit to Stand: Max assist;+2 safety/equipment Stand pivot transfers: Mod assist       General transfer comment: Pt performing stand pivot with Mod A for standing balance and Max cues to sequence. Pt requiring Max A for sit<>stand to place chair alarm pad in seat with RN. Pt's participation impacted by understanding and cognition    Balance Overall balance assessment: Needs assistance Sitting-balance support: Feet supported;Bilateral upper extremity supported;No upper extremity supported;Single extremity supported Sitting balance-Leahy Scale: Fair Sitting balance - Comments: Pt able to maintain sitting at EOB with MIn Guard A                                   ADL either performed or assessed with clinical judgement   ADL Overall ADL's : Needs assistance/impaired Eating/Feeding: Maximal assistance;Minimal assistance;Sitting Eating/Feeding Details (indicate cue type and reason): Set up for eating breakfast. Pt however with decreased awareness and understanding. Placed fork within visual field and pt opening his mouth. Pt reaching out to grab OJ when placed in his visual field.  Grooming: Sitting;Wash/dry face;Total assistance   Upper Body Bathing: Maximal assistance;Sitting   Lower Body Bathing: Total assistance;Sit to/from stand   Upper Body Dressing : Maximal assistance;Sitting   Lower Body Dressing: Total assistance;Sit to/from stand Lower Body Dressing Details (indicate cue type and reason): donning socks. Pt pulling back and stating "tickle" Toilet Transfer: Moderate assistance;Stand-pivot(simulated to recliner) Toilet Transfer Details (indicate cue type  and reason): Pt performing stand pivot to recliner with Mod A for standing balance and Max cues for sequencing and attention. Pt performing second sit<>stand from recliner to place chair alarm bad under him with RN and he required Max A for semi stand          Functional mobility during ADLs: Moderate assistance;Maximal assistance;+2 for safety/equipment(stand pivot and sit<>stand only) General ADL Comments: Pt presenting with decreased activity tolerance and poor cognition. Pt with difficulty following cues. Shaking OT's hand appropiately      Vision Baseline Vision/History: Wears glasses Patient Visual Report: Other (comment)(Not in room)     Perception     Praxis      Cognition Arousal/Alertness: Awake/alert Behavior During Therapy: Restless Overall Cognitive Status: History of cognitive impairments - at baseline                                 General Comments: Baseline dementia. From memory care unit. Unsure of baseline cognition and if this is worse        Exercises     Shoulder Instructions       General Comments Pt SpO2 100% on RA    Pertinent Vitals/ Pain       Pain Assessment: Faces Faces Pain Scale: Hurts even more Pain Location: he is very sensative to touch of his LEs Pain Descriptors / Indicators: Grimacing;Guarding Pain Intervention(s): Monitored during session;Limited activity within patient's tolerance;Repositioned  Home Living Family/patient expects to be discharged to:: Unsure                                 Additional Comments: Attempted to call daughter Katharine Look) and unable to reach her. Unsure of PLOF.       Prior Functioning/Environment          Comments: Unable to reach family to collect PLOF   Frequency  Min 2X/week        Progress Toward Goals  OT Goals(current goals can now be found in the care plan section)     Acute Rehab OT Goals Patient Stated Goal: Unable to state. Agreeable to sit in chair OT Goal Formulation: Patient unable to participate in goal setting Time For Goal Achievement: 07/08/18 Potential to Achieve Goals: Good  Plan      Co-evaluation                 AM-PAC OT "6 Clicks" Daily Activity     Outcome Measure    Help from another person eating meals?: A Lot Help from another person taking care of personal grooming?: A Lot Help from another person toileting, which includes using toliet, bedpan, or urinal?: A Lot Help from another person bathing (including washing, rinsing, drying)?: A Lot Help from another person to put on and taking off regular upper body clothing?: A Lot Help from another person to put on and taking off regular lower body clothing?: Total 6 Click Score: 11    End of Session    OT Visit Diagnosis: Unsteadiness on feet (R26.81);Other abnormalities of gait and mobility (R26.89);Muscle weakness (generalized) (M62.81);Other symptoms and signs involving cognitive function   Activity Tolerance Patient tolerated treatment well   Patient Left in chair;with call bell/phone within reach;with chair alarm set;with nursing/sitter in room   Nurse Communication Mobility status        Time: 9381-0175 OT Time Calculation (min):  35 min  Charges: OT General Charges $OT Visit: 1 Visit OT Evaluation $OT Eval Moderate Complexity: 1 Mod OT Treatments $Self Care/Home Management : 8-22 mins  Miron Marxen MSOT, OTR/L Acute Rehab Pager: [phone] Office: [place] 06/24/2018, 11:06 AM

## 2018-06-25 LAB — COMPREHENSIVE METABOLIC PANEL
ALT: 105 U/L — ABNORMAL HIGH (ref 0–44)
AST: 75 U/L — ABNORMAL HIGH (ref 15–41)
Albumin: 2.7 g/dL — ABNORMAL LOW (ref 3.5–5.0)
Alkaline Phosphatase: 67 U/L (ref 38–126)
Anion gap: 10 (ref 5–15)
BUN: 31 mg/dL — ABNORMAL HIGH (ref 8–23)
CO2: 27 mmol/L (ref 22–32)
Calcium: 8.4 mg/dL — ABNORMAL LOW (ref 8.9–10.3)
Chloride: 109 mmol/L (ref 98–111)
Creatinine, Ser: 0.97 mg/dL (ref 0.61–1.24)
GFR calc Af Amer: >60 mL/min (ref >60)
GFR calc non Af Amer: >60 mL/min (ref >60)
Glucose, Bld: 102 mg/dL — ABNORMAL HIGH (ref 70–99)
Potassium: 3.8 mmol/L (ref 3.5–5.1)
Sodium: 146 mmol/L — ABNORMAL HIGH (ref 135–145)
Total Bilirubin: 0.7 mg/dL (ref 0.3–1.2)
Total Protein: 5.4 g/dL — ABNORMAL LOW (ref 6.5–8.1)

## 2018-06-25 LAB — GLUCOSE, CAPILLARY
Glucose-Capillary: 100 mg/dL — ABNORMAL HIGH (ref 70–99)
Glucose-Capillary: 101 mg/dL — ABNORMAL HIGH (ref 70–99)
Glucose-Capillary: 111 mg/dL — ABNORMAL HIGH (ref 70–99)
Glucose-Capillary: 154 mg/dL — ABNORMAL HIGH (ref 70–99)
Glucose-Capillary: 81 mg/dL (ref 70–99)
Glucose-Capillary: 89 mg/dL (ref 70–99)

## 2018-06-25 LAB — CBC WITH DIFFERENTIAL/PLATELET
Abs Immature Granulocytes: 0.2 10*3/uL — ABNORMAL HIGH (ref 0.00–0.07)
Basophils Absolute: 0 10*3/uL (ref 0.0–0.1)
Basophils Relative: 0 %
Eosinophils Absolute: 0 10*3/uL (ref 0.0–0.5)
Eosinophils Relative: 0 %
HCT: 36 % — ABNORMAL LOW (ref 39.0–52.0)
Hemoglobin: 11.6 g/dL — ABNORMAL LOW (ref 13.0–17.0)
Immature Granulocytes: 3 %
Lymphocytes Relative: 18 %
Lymphs Abs: 1.5 10*3/uL (ref 0.7–4.0)
MCH: 32.3 pg (ref 26.0–34.0)
MCHC: 32.2 g/dL (ref 30.0–36.0)
MCV: 100.3 fL — ABNORMAL HIGH (ref 80.0–100.0)
Monocytes Absolute: 0.4 10*3/uL (ref 0.1–1.0)
Monocytes Relative: 4 %
Neutro Abs: 6 10*3/uL (ref 1.7–7.7)
Neutrophils Relative %: 75 %
Platelets: 214 10*3/uL (ref 150–400)
RBC: 3.59 MIL/uL — ABNORMAL LOW (ref 4.22–5.81)
RDW: 12.4 % (ref 11.5–15.5)
WBC: 8 10*3/uL (ref 4.0–10.5)
nRBC: 0.4 % — ABNORMAL HIGH (ref 0.0–0.2)

## 2018-06-25 NOTE — Progress Notes (Signed)
Physical Therapy Treatment Patient Details Name: Marvin Wagner MRN: [id number] DOB: 09-23-1930 Today's Date: 06/25/2018    History of Present Illness 85 y.o. male admitted 06/19/18 due to fever and confusion.  Pt dx with COVID after second test (initial test was negative) came back positive. Pt also with LFT elevation, AKI, hypernatremia, hypokalemia, acute metabolic encephalopathy.  Pt with other significant PMH of HTN, CAD, aortic stenosis, dementia, and aortic valve replacement.      PT Comments    Pt was able to stand and pivot to the recliner chair with two person heavy assist. He continues to respond well to music and multimodal basic commands and visual cues.  PT will continue to follow acutely for safe mobility progression  Follow Up Recommendations  Other (comment)(return to ALF?  If they can handle him at this level)     Financial risk analyst (measurements PT);Wheelchair cushion (measurements PT);Hospital bed    Recommendations for Other Services   NA     Precautions / Restrictions Precautions Precautions: Fall    Mobility  Bed Mobility Overal bed mobility: Needs Assistance Bed Mobility: Supine to Sit     Supine to sit: Mod assist;HOB elevated;+2 for physical assistance     General bed mobility comments: Two person mod assist to progress to EOB.  Asked pt to come dance with me and sit up.  Basic commands help as well as visual cues.  Pt responds positively to music.   Transfers Overall transfer level: Needs assistance Equipment used: 1 person hand held assist Transfers: Sit to/from [facility] Sit to Stand: +2 physical assistance;Mod assist;From elevated surface         General transfer comment: Two person heavy mod assist to come to standing x2 and to pivot to the recliner chair. Third stand from recliner chair one person mod assist with the use of bed rail for him to help pull to standing for peri care worked well.    Ambulation/Gait             General Gait Details: was mostly transfers here recently, was ambulatory prior to [place], but went a bit down hill from a mobility standpoint since being sequestered in his room at ALF.          Balance Overall balance assessment: Needs assistance Sitting-balance support: Feet supported;Bilateral upper extremity supported Sitting balance-Leahy Scale: Poor Sitting balance - Comments: Needs min assist EOB for safety.  Can do breif periods of close supervision Postural control: Posterior lean Standing balance support: Bilateral upper extremity supported Standing balance-Leahy Scale: Poor Standing balance comment: needs external support in standing.  Difficult to come up to full upright posture (did better coming up with bed rail.                             Cognition Arousal/Alertness: Awake/alert Behavior During Therapy: WFL for tasks assessed/performed Overall Cognitive Status: History of cognitive impairments - at baseline                                 General Comments: Baseline dementia. From memory care unit. Unsure of baseline cognition and if this is worse      Exercises Other Exercises Other Exercises: "dancing" holding hands, tapping toes to music.    General Comments General comments (skin integrity, edema, etc.): VSS on RA, checked throughout.  Pertinent Vitals/Pain Pain Assessment: Faces Faces Pain Scale: Hurts little more Pain Location: with peri care Pain Descriptors / Indicators: Grimacing;Guarding Pain Intervention(s): Limited activity within patient's tolerance;Monitored during session;Repositioned    Home Living Family/patient expects to be discharged to:: Unsure                        PT Goals (current goals can now be found in the care plan section) Acute Rehab PT Goals Patient Stated Goal: unable to state Progress towards PT goals: Progressing toward goals     Frequency    Min 3X/week      PT Plan Current plan remains appropriate       AM-PAC PT "6 Clicks" Mobility   Outcome Measure  Help needed turning from your back to your side while in a flat bed without using bedrails?: A Lot Help needed moving from lying on your back to sitting on the side of a flat bed without using bedrails?: A Lot Help needed moving to and from a bed to a chair (including a wheelchair)?: A Lot Help needed standing up from a chair using your arms (e.g., wheelchair or bedside chair)?: A Lot Help needed to walk in hospital room?: Total Help needed climbing 3-5 steps with a railing? : Total 6 Click Score: 10    End of Session Equipment Utilized During Treatment: Gait belt Activity Tolerance: Patient tolerated treatment well Patient left: in chair;with call bell/phone within reach;with chair alarm set   PT Visit Diagnosis: Muscle weakness (generalized) (M62.81);Difficulty in walking, not elsewhere classified (R26.2)     Time: 4492-0100 PT Time Calculation (min) (ACUTE ONLY): 31 min  Charges:  $Therapeutic Activity: 23-37 mins                    Keanna Tugwell B. Pennelope Basque, PT, DPT  Acute Rehabilitation [phone] pager #[phone] office   06/25/2018, 11:41 AM

## 2018-06-25 NOTE — Progress Notes (Signed)
Called patient's daughter, Katharine Look, with updates.  Addressed questions and concerns.

## 2018-06-25 NOTE — Progress Notes (Addendum)
PROGRESS NOTE                                                                                                                                                                                                             Patient Demographics:    Marvin Wagner, is a 85 y.o. male, DOB - 08-Sep-1930, ZCH:[id number]  Outpatient Primary MD for the patient is Burchette, Alinda Sierras, MD    LOS - 6  Chief Complaint  Patient presents with  . Shortness of Breath       Brief Narrative: Patient is a 85 y.o. male with PMHx of dementia, AFib, HTN, and CAD who presented with confusion and fever found to have covid-19.   Subjective:    Marvin Wagner remains pleasantly confused.  Lying comfortably in bed-no major events per RN.  Remains on room air   Assessment  & Plan :   COVID-19 viral pneumonia: Improved-now on room air.  Inflammatory markers have markedly improved.  Continue supportive care-awaiting appropriate disposition-social work following.    COVID-19 Labs:  Recent Labs    06/23/18 0331 06/24/18 0656  DDIMER 2.16* 3.33*  FERRITIN 883* 659*  CRP 2.0* 0.9    Lab Results  Component Value Date   SARSCOV2NAA POSITIVE (A) 06/19/2018   [place] NEGATIVE 06/18/2018     COVID-19 Medications: 5/20>> Actemra 5/19>> 5/22>> Solu-Medrol  Transaminitis: Probably secondary to COVID-19 or from Actemra-continuous to slowly downtrend.  Follow periodically.   AKI: Resolved-likely hemodynamically mediated  Hypernatremia: Improved-monitor periodically.  Acute metabolic encephalopathy: Secondary to COVID-19 infection-improved with supportive care.  Has dementia at baseline-suspect back to usual baseline.  Chronic atrial fibrillation: Rate controlled with metoprolol.  Not a candidate for anticoagulation.  CAD: No anginal symptoms-continue aspirin/metoprolol and statin  Dementia with delirium: Pleasantly confused-suspect not far  from baseline-continue Aricept/Namenda and Risperdal  Dysphagia: Probably related to dementia-evaluated by SLP-continue dysphagia 3 diet    ABG:    Component Value Date/Time   PHART 7.371 12/17/2008 2024   PCO2ART 42.2 12/17/2008 2024   PO2ART 162.0 (H) 12/17/2008 2024   HCO3 24.2 (H) 12/17/2008 2024   TCO2 22 12/17/2008 2028   ACIDBASEDEF 1.0 12/17/2008 2024   O2SAT 99.0 12/17/2008 2024    Condition - stable  Family Communication  : Unable to reach daughter-left voicemail for daughter  Code Status :  DNR  Diet : Dysphagia 3 diet  Disposition Plan  :  Remain inpatient-SNF vs ALF over the next few days  Consults  :  None  Procedures  :  None  DVT Prophylaxis  :  Lovenox   Lab Results  Component Value Date   PLT 214 06/25/2018    Inpatient Medications  Scheduled Meds: . aspirin EC  81 mg Oral Daily  . atorvastatin  40 mg Oral Daily  . donepezil  10 mg Oral QHS  . enoxaparin (LOVENOX) injection  40 mg Subcutaneous Q24H  . mouth rinse  15 mL Mouth Rinse BID  . Melatonin  1 mg Oral QHS  . memantine  10 mg Oral BID  . metoprolol tartrate  12.5 mg Oral Daily   Continuous Infusions: PRN Meds:.acetaminophen **OR** acetaminophen, LORazepam, ondansetron **OR** ondansetron (ZOFRAN) IV, risperiDONE  Antibiotics  :    Anti-infectives (From admission, onward)   Start     Dose/Rate Route Frequency Ordered Stop   06/19/18 2000  cefTRIAXone (ROCEPHIN) 1 g in sodium chloride 0.9 % 100 mL IVPB  Status:  Discontinued     1 g 200 mL/hr over 30 Minutes Intravenous Every 24 hours 06/19/18 0014 06/20/18 1811   06/19/18 2000  azithromycin (ZITHROMAX) 500 mg in sodium chloride 0.9 % 250 mL IVPB  Status:  Discontinued     500 mg 250 mL/hr over 60 Minutes Intravenous Every 24 hours 06/19/18 0014 06/20/18 1811   06/18/18 2215  cefTRIAXone (ROCEPHIN) 1 g in sodium chloride 0.9 % 100 mL IVPB     1 g 200 mL/hr over 30 Minutes Intravenous  Once 06/18/18 2209 06/19/18 0006   06/18/18  2215  azithromycin (ZITHROMAX) 500 mg in sodium chloride 0.9 % 250 mL IVPB     500 mg 250 mL/hr over 60 Minutes Intravenous  Once 06/18/18 2209 06/19/18 0006       Time Spent in minutes  25   Oren Binet M.D on 06/25/2018 at 11:43 AM  To page go to [email or web address] - use universal password  [facility] -  Office  [phone]   See all Orders from today for further details   Admit date - 06/18/2018    6    Objective:   Vitals:   06/25/18 0000 06/25/18 0014 06/25/18 0931 06/25/18 1026  BP:   (!) 149/96 124/61  Pulse: 71  89 81  Resp:   20   Temp:  97.9 F (36.6 C)    TempSrc:  Axillary    SpO2: 100%  98% 100%  Weight:      Height:        Wt Readings from Last 3 Encounters:  06/18/18 81 kg  09/21/17 79.4 kg  02/08/17 77.9 kg     Intake/Output Summary (Last 24 hours) at 06/25/2018 1143 Last data filed at 06/24/2018 2147 Gross per 24 hour  Intake 354 ml  Output 250 ml  Net 104 ml     Physical Exam General appearance:Awake-pleasantly confused.  Lying comfortably in bed without any distress Eyes:no scleral icterus. HEENT: Atraumatic and Normocephalic Neck: supple Resp:Good air entry bilaterally,no rales or rhonchi CVS: S1 S2 regular, no murmurs.  GI: Bowel sounds present, Non tender and not distended with no gaurding, rigidity or rebound. Extremities: B/L Lower Ext shows no edema, both legs are warm to touch Neurology:  Non focal   Data Review:    CBC Recent Labs  Lab 06/21/18 0404 06/22/18 0600 06/23/18 0331 06/24/18 0656 06/25/18 0313  WBC  9.4 12.9* 12.5* 8.2 8.0  HGB 11.0* 11.2* 10.7* 12.2* 11.6*  HCT 33.9* 34.4* 33.0* 35.4* 36.0*  PLT 186 227 220 205 214  MCV 100.0 99.1 100.0 97.3 100.3*  MCH 32.4 32.3 32.4 33.5 32.3  MCHC 32.4 32.6 32.4 34.5 32.2  RDW 12.7 12.5 12.6 12.4 12.4  LYMPHSABS 1.3 0.7 0.9 1.2 1.5  MONOABS 0.5 0.6 0.7 0.3 0.4  EOSABS 0.0 0.0 0.0 0.0 0.0  BASOSABS 0.0 0.0 0.0 0.0 0.0    Chemistries  Recent Labs   Lab 06/21/18 0404 06/22/18 0600 06/23/18 0331 06/24/18 0656 06/25/18 0313  NA 146* 148* 146* 142 146*  K 3.8 3.4* 3.8 3.6 3.8  CL 115* 115* 116* 110 109  CO2 21* 23 22 24 27   GLUCOSE 164* 148* 122* 101* 102*  BUN 40* 47* 39* 26* 31*  CREATININE 0.95 0.96 0.91 0.91 0.97  CALCIUM 7.8* 8.0* 7.9* 7.9* 8.4*  MG 3.0* 3.2* 3.0* 2.5*  --   AST 83* 67* 95* 96* 75*  ALT 59* 64* 88* 112* 105*  ALKPHOS 58 66 69 70 67  BILITOT 0.6 0.7 0.7 0.6 0.7   ------------------------------------------------------------------------------------------------------------------ No results for input(s): CHOL, HDL, LDLCALC, TRIG, CHOLHDL, LDLDIRECT in the last 72 hours.  Lab Results  Component Value Date   HGBA1C  12/15/2008    5.3 (NOTE) The ADA recommends the following therapeutic goal for glycemic control related to Hgb A1c measurement: Goal of therapy: <6.5 Hgb A1c  Reference: American Diabetes Association: Clinical Practice Recommendations 2010, Diabetes Care, 2010, 33: (Suppl  1).   ------------------------------------------------------------------------------------------------------------------ No results for input(s): TSH, T4TOTAL, T3FREE, THYROIDAB in the last 72 hours.  Invalid input(s): FREET3 ------------------------------------------------------------------------------------------------------------------ Recent Labs    06/23/18 0331 06/24/18 0656  FERRITIN 883* 659*    Coagulation profile Recent Labs  Lab 06/19/18 1500  INR 1.2    Recent Labs    06/23/18 0331 06/24/18 0656  DDIMER 2.16* 3.33*    Cardiac Enzymes Recent Labs  Lab 06/18/18 2101 06/19/18 0908 06/20/18 0915  TROPONINI 0.04* 0.04* 0.03*   ------------------------------------------------------------------------------------------------------------------    Component Value Date/Time   BNP 175.0 (H) 01/14/2017 1058    Micro Results Recent Results (from the past 240 hour(s))  Urine culture     Status: None    Collection Time: 06/18/18  8:48 PM  Result Value Ref Range Status   Specimen Description   Final    URINE, RANDOM Performed at [facility], [place] [address] [place], [place] [address]    Special Requests   Final    NONE Performed at [facility], [place] [address], [place], [place] [address]    Culture   Final    NO GROWTH Performed at [facility], [place] [address], [place],  [address]    Report Status 06/19/2018 FINAL  Final  SARS Coronavirus 2 (CEPHEID- Performed in [place] hospital lab), Hosp Order     Status: None   Collection Time: 06/18/18  8:53 PM  Result Value Ref Range Status   SARS Coronavirus 2 NEGATIVE NEGATIVE Final    Comment: (NOTE) If result is NEGATIVE SARS-CoV-2 target nucleic acids are NOT DETECTED. The SARS-CoV-2 RNA is generally detectable in upper and lower  respiratory specimens during the acute phase of infection. The lowest  concentration of SARS-CoV-2 viral copies this assay can detect is 250  copies / mL. A negative result does not preclude SARS-CoV-2 infection  and should not be used as the sole basis for  treatment or other  patient management decisions.  A negative result may occur with  improper specimen collection / handling, submission of specimen other  than nasopharyngeal swab, presence of viral mutation(s) within the  areas targeted by this assay, and inadequate number of viral copies  (<250 copies / mL). A negative result must be combined with clinical  observations, patient history, and epidemiological information. If result is POSITIVE SARS-CoV-2 target nucleic acids are DETECTED. The SARS-CoV-2 RNA is generally detectable in upper and lower  respiratory specimens dur ing the acute phase of infection.  Positive  results are indicative of active infection with SARS-CoV-2.  Clinical  correlation with patient history and other diagnostic information is  necessary to determine patient  infection status.  Positive results do  not rule out bacterial infection or co-infection with other viruses. If result is PRESUMPTIVE POSTIVE SARS-CoV-2 nucleic acids MAY BE PRESENT.   A presumptive positive result was obtained on the submitted specimen  and confirmed on repeat testing.  While 2019 novel coronavirus  (SARS-CoV-2) nucleic acids may be present in the submitted sample  additional confirmatory testing may be necessary for epidemiological  and / or clinical management purposes  to differentiate between  SARS-CoV-2 and other Sarbecovirus currently known to infect humans.  If clinically indicated additional testing with an alternate test  methodology [phone]) is advised. The SARS-CoV-2 RNA is generally  detectable in upper and lower respiratory sp ecimens during the acute  phase of infection. The expected result is Negative. Fact Sheet for Patients:  StrictlyIdeas.no Fact Sheet for Healthcare Providers: BankingDealers.co.za This test is not yet approved or cleared by the [place] FDA and has been authorized for detection and/or diagnosis of SARS-CoV-2 by FDA under an Emergency Use Authorization (EUA).  This EUA will remain in effect (meaning this test can be used) for the duration of the COVID-19 declaration under Section 564(b)(1) of the Act, 21 U.S.C. section 360bbb-3(b)(1), unless the authorization is terminated or revoked sooner. Performed at [facility], [place] [address] [place], [place] [address]   Blood Culture (routine x 2)     Status: None   Collection Time: 06/18/18  9:01 PM  Result Value Ref Range Status   Specimen Description   Final    BLOOD LEFT ARM Performed at [place] [address], [place], [place] [address]    Special Requests   Final    BOTTLES DRAWN AEROBIC AND ANAEROBIC Blood Culture adequate volume Performed at [facility]  [address] [place], [place] [address]    Culture   Final    NO GROWTH 5 DAYS Performed at [facility], [place] [address] [place], [place] [address]    Report Status 06/23/2018 FINAL  Final  Blood Culture (routine x 2)     Status: None   Collection Time: 06/18/18  9:01 PM  Result Value Ref Range Status   Specimen Description   Final    BLOOD RIGHT ARM Performed at [place] [address], [place], [place] [address]    Special Requests   Final    BOTTLES DRAWN AEROBIC AND ANAEROBIC Blood Culture adequate volume Performed at [place] [address], [place], [place] [address]    Culture   Final    NO GROWTH 5 DAYS Performed at [facility], [place] [address], [place], [place] [address]    Report Status 06/23/2018 FINAL  Final  MRSA PCR Screening  Status: None   Collection Time: 06/19/18  2:53 AM  Result Value Ref Range Status   MRSA by PCR NEGATIVE NEGATIVE Final    Comment:        The GeneXpert MRSA Assay (FDA approved for NASAL specimens only), is one component of a comprehensive MRSA colonization surveillance program. It is not intended to diagnose MRSA infection nor to guide or monitor treatment for MRSA infections. Performed at [facility], [place] [address] [place], [place] [address]   SARS Coronavirus 2 (CEPHEID- Performed in [place] hospital lab), Hosp Order     Status: Abnormal   Collection Time: 06/19/18  6:15 AM  Result Value Ref Range Status   SARS Coronavirus 2 POSITIVE (A) NEGATIVE Final    Comment: RESULT CALLED TO, READ BACK BY AND VERIFIED WITH: NELSON,A. RN @0809  ON 5.19.2020 BY NMCCOY (NOTE) If result is NEGATIVE SARS-CoV-2 target nucleic acids are NOT DETECTED. The SARS-CoV-2 RNA is generally detectable in upper and lower  respiratory specimens during the acute phase of infection. The lowest  concentration of SARS-CoV-2 viral copies this assay can detect is 250  copies /  mL. A negative result does not preclude SARS-CoV-2 infection  and should not be used as the sole basis for treatment or other  patient management decisions.  A negative result may occur with  improper specimen collection / handling, submission of specimen other  than nasopharyngeal swab, presence of viral mutation(s) within the  areas targeted by this assay, and inadequate number of viral copies  (<250 copies / mL). A negative result must be combined with clinical  observations, patient history, and epidemiological information. If result is POSITIVE SARS-CoV-2 target nucleic acids are DETECT ED. The SARS-CoV-2 RNA is generally detectable in upper and lower  respiratory specimens during the acute phase of infection.  Positive  results are indicative of active infection with SARS-CoV-2.  Clinical  correlation with patient history and other diagnostic information is  necessary to determine patient infection status.  Positive results do  not rule out bacterial infection or co-infection with other viruses. If result is PRESUMPTIVE POSTIVE SARS-CoV-2 nucleic acids MAY BE PRESENT.   A presumptive positive result was obtained on the submitted specimen  and confirmed on repeat testing.  While 2019 novel coronavirus  (SARS-CoV-2) nucleic acids may be present in the submitted sample  additional confirmatory testing may be necessary for epidemiological  and / or clinical management purposes  to differentiate between  SARS-CoV-2 and other Sarbecovirus currently known to infect humans.  If clinically indicated additional testing with an alternate test  methodology ([id number] [phone]) is advised. The SARS-CoV-2 RNA is generally  detectable in upper and lower respiratory specimens during the acute  phase of infection. The expected result is Negative. Fact Sheet for Patients:  StrictlyIdeas.no Fact Sheet for Healthcare Providers: BankingDealers.co.za This test is  not yet approved or cleared by the [place] FDA and has been authorized for detection and/or diagnosis of SARS-CoV-2 by FDA under an Emergency Use Authorization (EUA).  This EUA will remain in effect (meaning this test can be used) for the duration of the COVID-19 declaration under Section 564(b)(1) of the Act, 21 U.S.C. section 360bbb-3(b)(1), unless the authorization is terminated or revoked sooner. Performed at [facility], [place] [address], [place], [place] [address]   Respiratory Panel by PCR     Status: None   Collection Time: 06/21/18  3:15 PM  Result Value Ref Range Status   Adenovirus NOT DETECTED  NOT DETECTED Final   Coronavirus 229E NOT DETECTED NOT DETECTED Final    Comment: (NOTE) The Coronavirus on the Respiratory Panel, DOES NOT test for the novel  Coronavirus (2019 nCoV)    Coronavirus HKU1 NOT DETECTED NOT DETECTED Final   Coronavirus NL63 NOT DETECTED NOT DETECTED Final   Coronavirus OC43 NOT DETECTED NOT DETECTED Final   Metapneumovirus NOT DETECTED NOT DETECTED Final   Rhinovirus / Enterovirus NOT DETECTED NOT DETECTED Final   Influenza A NOT DETECTED NOT DETECTED Final   Influenza B NOT DETECTED NOT DETECTED Final   Parainfluenza Virus 1 NOT DETECTED NOT DETECTED Final   Parainfluenza Virus 2 NOT DETECTED NOT DETECTED Final   Parainfluenza Virus 3 NOT DETECTED NOT DETECTED Final   Parainfluenza Virus 4 NOT DETECTED NOT DETECTED Final   Respiratory Syncytial Virus NOT DETECTED NOT DETECTED Final   Bordetella pertussis NOT DETECTED NOT DETECTED Final   Chlamydophila pneumoniae NOT DETECTED NOT DETECTED Final   Mycoplasma pneumoniae NOT DETECTED NOT DETECTED Final    Comment: Performed at [facility], [place] [address], [place], [place] [address]    Radiology Reports Ct Head Wo Contrast  Result Date: 06/19/2018 CLINICAL DATA:  85 year old male with shortness of breath weakness, altered mental status. Fever of 101.4 F. EXAM: CT HEAD  WITHOUT CONTRAST TECHNIQUE: Contiguous axial images were obtained from the base of the skull through the vertex without intravenous contrast. COMPARISON:  Head CT 01/14/2017, MRI 12/26/2016 and earlier. FINDINGS: Brain: Stable cerebral volume. Stable ventricle size and configuration. No midline shift, mass effect, or evidence of intracranial mass lesion. No acute intracranial hemorrhage identified. Stable gray-white matter differentiation throughout the brain. No cortically based acute infarct identified. Vascular: Calcified atherosclerosis at the skull base. No suspicious intracranial vascular hyperdensity. Skull: No acute osseous abnormality identified. Sinuses/Orbits: Visualized paranasal sinuses and mastoids are stable and well pneumatized. Other: Stable orbit and scalp soft tissues. IMPRESSION: Stable non contrast CT appearance of the brain since 2018. No acute intracranial abnormality. Electronically Signed   By: Genevie Ann M.D.   On: 06/19/2018 00:24   Dg Chest Portable 1 View  Result Date: 06/18/2018 CLINICAL DATA:  85 year old male with recent pneumonia. Fever and shortness of breath. COVID-19 status is pending. EXAM: PORTABLE CHEST 1 VIEW COMPARISON:  01/14/2017 and earlier. FINDINGS: Portable AP semi upright view at 2042 hours. Ill-defined abnormal right upper lobe opacity near the hilum is new since 2018. there is also patchy confluent opacity at the right lateral costophrenic angle. There is mild superimposed asymmetric indistinct and interstitial opacity in both lungs. Chronic prosthetic cardiac valve. Stable cardiac size and mediastinal contours. Calcified aortic atherosclerosis. Visualized tracheal air column is within normal limits. No pneumothorax or pleural effusion. Negative visible bowel gas pattern. No acute osseous abnormality identified. IMPRESSION: Abnormal right greater than left pulmonary opacity most confluent in the right upper lobe opacity. Consider acute pneumonia versus  viral/atypical respiratory infection. No pleural effusion. Electronically Signed   By: Genevie Ann M.D.   On: 06/18/2018 21:43

## 2018-06-26 LAB — CREATININE, SERUM
Creatinine, Ser: 1.07 mg/dL (ref 0.61–1.24)
GFR calc Af Amer: >60 mL/min (ref >60)
GFR calc non Af Amer: >60 mL/min (ref >60)

## 2018-06-26 LAB — GLUCOSE, CAPILLARY
Glucose-Capillary: 107 mg/dL — ABNORMAL HIGH (ref 70–99)
Glucose-Capillary: 121 mg/dL — ABNORMAL HIGH (ref 70–99)
Glucose-Capillary: 91 mg/dL (ref 70–99)
Glucose-Capillary: 91 mg/dL (ref 70–99)
Glucose-Capillary: 93 mg/dL (ref 70–99)
Glucose-Capillary: 96 mg/dL (ref 70–99)
Glucose-Capillary: 97 mg/dL (ref 70–99)

## 2018-06-26 NOTE — Progress Notes (Signed)
Called Hermina Staggers with update.  Addressed all questions and concerns.

## 2018-06-26 NOTE — Progress Notes (Signed)
PROGRESS NOTE                                                                                                                                                                                                             Patient Demographics:    Marvin Wagner, is a 85 y.o. male, DOB - 1930-11-22, TDV:[id number]  Outpatient Primary MD for the patient is Burchette, Alinda Sierras, MD    LOS - 7  Chief Complaint  Patient presents with  . Shortness of Breath       Brief Narrative: Patient is a 85 y.o. male with PMHx of dementia, AFib, HTN, and CAD who presented with confusion and fever found to have covid-19.   Subjective:    Marvin Wagner remains pleasantly confused.  Not in any distress.  No major events over night per RN.  Remains on room air.   Assessment  & Plan :   COVID-19 viral pneumonia: Improved-patient is s/p Actemra and steroids.  Inflammatory markers have trended down, patient is on room air.  COVID-19 Labs:  Recent Labs    06/24/18 0656  DDIMER 3.33*  FERRITIN 659*  CRP 0.9    Lab Results  Component Value Date   SARSCOV2NAA POSITIVE (A) 06/19/2018   [place] NEGATIVE 06/18/2018     COVID-19 Medications: 5/20>> Actemra 5/19>> 5/22>> Solu-Medrol  Transaminitis: Probably secondary to COVID-19 or from Actemra-continuous to slowly downtrend.  Follow periodically.   AKI: Resolved-likely hemodynamically mediated  Hypernatremia: Improved-monitor periodically.  Acute metabolic encephalopathy: Secondary to COVID-19 infection-improved with supportive care.  Has dementia at baseline-suspect back to usual baseline.  Chronic atrial fibrillation: Rate controlled with metoprolol.  Not a candidate for anticoagulation.  CAD: No anginal symptoms-continue aspirin/metoprolol and statin  Dementia with delirium: Pleasantly confused-suspect not far from baseline-continue Aricept/Namenda and Risperdal  Dysphagia:  Probably related to dementia-evaluated by SLP-continue dysphagia 3 diet    ABG:    Component Value Date/Time   PHART 7.371 12/17/2008 2024   PCO2ART 42.2 12/17/2008 2024   PO2ART 162.0 (H) 12/17/2008 2024   HCO3 24.2 (H) 12/17/2008 2024   TCO2 22 12/17/2008 2028   ACIDBASEDEF 1.0 12/17/2008 2024   O2SAT 99.0 12/17/2008 2024    Condition - stable  Family Communication  : Unable to reach both patient's daughters listed in the chart today.  Code Status :  DNR  Diet : Dysphagia 3 diet  Disposition Plan  :  Remain inpatient-SNF vs ALF over the next few days  Consults  :  None  Procedures  :  None  DVT Prophylaxis  :  Lovenox   Lab Results  Component Value Date   PLT 214 06/25/2018    Inpatient Medications  Scheduled Meds: . aspirin EC  81 mg Oral Daily  . atorvastatin  40 mg Oral Daily  . donepezil  10 mg Oral QHS  . enoxaparin (LOVENOX) injection  40 mg Subcutaneous Q24H  . mouth rinse  15 mL Mouth Rinse BID  . Melatonin  1 mg Oral QHS  . memantine  10 mg Oral BID  . metoprolol tartrate  12.5 mg Oral Daily   Continuous Infusions: PRN Meds:.acetaminophen **OR** acetaminophen, LORazepam, ondansetron **OR** ondansetron (ZOFRAN) IV, risperiDONE  Antibiotics  :    Anti-infectives (From admission, onward)   Start     Dose/Rate Route Frequency Ordered Stop   06/19/18 2000  cefTRIAXone (ROCEPHIN) 1 g in sodium chloride 0.9 % 100 mL IVPB  Status:  Discontinued     1 g 200 mL/hr over 30 Minutes Intravenous Every 24 hours 06/19/18 0014 06/20/18 1811   06/19/18 2000  azithromycin (ZITHROMAX) 500 mg in sodium chloride 0.9 % 250 mL IVPB  Status:  Discontinued     500 mg 250 mL/hr over 60 Minutes Intravenous Every 24 hours 06/19/18 0014 06/20/18 1811   06/18/18 2215  cefTRIAXone (ROCEPHIN) 1 g in sodium chloride 0.9 % 100 mL IVPB     1 g 200 mL/hr over 30 Minutes Intravenous  Once 06/18/18 2209 06/19/18 0006   06/18/18 2215  azithromycin (ZITHROMAX) 500 mg in sodium  chloride 0.9 % 250 mL IVPB     500 mg 250 mL/hr over 60 Minutes Intravenous  Once 06/18/18 2209 06/19/18 0006       Time Spent in minutes  25   Marvin Wagner M.D on 06/26/2018 at 12:02 PM  To page go to [email or web address] - use universal password  [facility] -  Office  [phone]   See all Orders from today for further details   Admit date - 06/18/2018    7    Objective:   Vitals:   06/26/18 0400 06/26/18 0752 06/26/18 0936 06/26/18 1120  BP: 98/70 (!) 109/46 126/70 129/73  Pulse: 87  79 (!) 140  Resp:      Temp: 97.8 F (36.6 C) (!) 97.3 F (36.3 C)  97.7 F (36.5 C)  TempSrc: Axillary Axillary  Oral  SpO2: 92%  97% (!) 82%  Weight:      Height:        Wt Readings from Last 3 Encounters:  06/18/18 81 kg  09/21/17 79.4 kg  02/08/17 77.9 kg     Intake/Output Summary (Last 24 hours) at 06/26/2018 1202 Last data filed at 06/25/2018 2034 Gross per 24 hour  Intake -  Output 100 ml  Net -100 ml     Physical Exam General appearance: Awake-pleasantly confused.  Not in any distress.   Eyes: No scleral icterus HEENT: Atraumatic and Normocephalic Neck: supple Resp: Clear to auscultation anteriorly CVS: S1 S2 regular GI: Bowel sounds present, Non tender and not distended with no gaurding, rigidity or rebound. Extremities: No edema Neurology: Moving all 4 extremities-appears to be nonfocal.   Data Review:    CBC Recent Labs  Lab 06/21/18 0404 06/22/18 0600 06/23/18 0331 06/24/18 0656 06/25/18 0313  WBC 9.4 12.9*  12.5* 8.2 8.0  HGB 11.0* 11.2* 10.7* 12.2* 11.6*  HCT 33.9* 34.4* 33.0* 35.4* 36.0*  PLT 186 227 220 205 214  MCV 100.0 99.1 100.0 97.3 100.3*  MCH 32.4 32.3 32.4 33.5 32.3  MCHC 32.4 32.6 32.4 34.5 32.2  RDW 12.7 12.5 12.6 12.4 12.4  LYMPHSABS 1.3 0.7 0.9 1.2 1.5  MONOABS 0.5 0.6 0.7 0.3 0.4  EOSABS 0.0 0.0 0.0 0.0 0.0  BASOSABS 0.0 0.0 0.0 0.0 0.0    Chemistries  Recent Labs  Lab 06/21/18 0404 06/22/18 0600 06/23/18 0331  06/24/18 0656 06/25/18 0313 06/26/18 0435  NA 146* 148* 146* 142 146*  --   K 3.8 3.4* 3.8 3.6 3.8  --   CL 115* 115* 116* 110 109  --   CO2 21* 23 22 24 27   --   GLUCOSE 164* 148* 122* 101* 102*  --   BUN 40* 47* 39* 26* 31*  --   CREATININE 0.95 0.96 0.91 0.91 0.97 1.07  CALCIUM 7.8* 8.0* 7.9* 7.9* 8.4*  --   MG 3.0* 3.2* 3.0* 2.5*  --   --   AST 83* 67* 95* 96* 75*  --   ALT 59* 64* 88* 112* 105*  --   ALKPHOS 58 66 69 70 67  --   BILITOT 0.6 0.7 0.7 0.6 0.7  --    ------------------------------------------------------------------------------------------------------------------ No results for input(s): CHOL, HDL, LDLCALC, TRIG, CHOLHDL, LDLDIRECT in the last 72 hours.  Lab Results  Component Value Date   HGBA1C  12/15/2008    5.3 (NOTE) The ADA recommends the following therapeutic goal for glycemic control related to Hgb A1c measurement: Goal of therapy: <6.5 Hgb A1c  Reference: American Diabetes Association: Clinical Practice Recommendations 2010, Diabetes Care, 2010, 33: (Suppl  1).   ------------------------------------------------------------------------------------------------------------------ No results for input(s): TSH, T4TOTAL, T3FREE, THYROIDAB in the last 72 hours.  Invalid input(s): FREET3 ------------------------------------------------------------------------------------------------------------------ Recent Labs    06/24/18 0656  FERRITIN 659*    Coagulation profile Recent Labs  Lab 06/19/18 1500  INR 1.2    Recent Labs    06/24/18 0656  DDIMER 3.33*    Cardiac Enzymes Recent Labs  Lab 06/20/18 0915  TROPONINI 0.03*   ------------------------------------------------------------------------------------------------------------------    Component Value Date/Time   BNP 175.0 (H) 01/14/2017 1058    Micro Results Recent Results (from the past 240 hour(s))  Urine culture     Status: None   Collection Time: 06/18/18  8:48 PM  Result Value  Ref Range Status   Specimen Description   Final    URINE, RANDOM Performed at [facility], [place] [address], [place], [place] [address]    Special Requests   Final    NONE Performed at [facility], [place] [address] [place], [place] [address]    Culture   Final    NO GROWTH Performed at [facility], [place] [address], [place], [place] [address]    Report Status 06/19/2018 FINAL  Final  SARS Coronavirus 2 (CEPHEID- Performed in [place] hospital lab), Hosp Order     Status: None   Collection Time: 06/18/18  8:53 PM  Result Value Ref Range Status   SARS Coronavirus 2 NEGATIVE NEGATIVE Final    Comment: (NOTE) If result is NEGATIVE SARS-CoV-2 target nucleic acids are NOT DETECTED. The SARS-CoV-2 RNA is generally detectable in upper and lower  respiratory specimens during the acute phase of infection. The lowest  concentration of SARS-CoV-2 viral copies this assay can detect  is 250  copies / mL. A negative result does not preclude SARS-CoV-2 infection  and should not be used as the sole basis for treatment or other  patient management decisions.  A negative result may occur with  improper specimen collection / handling, submission of specimen other  than nasopharyngeal swab, presence of viral mutation(s) within the  areas targeted by this assay, and inadequate number of viral copies  (<250 copies / mL). A negative result must be combined with clinical  observations, patient history, and epidemiological information. If result is POSITIVE SARS-CoV-2 target nucleic acids are DETECTED. The SARS-CoV-2 RNA is generally detectable in upper and lower  respiratory specimens dur ing the acute phase of infection.  Positive  results are indicative of active infection with SARS-CoV-2.  Clinical  correlation with patient history and other diagnostic information is  necessary to determine patient infection status.  Positive results do  not rule out  bacterial infection or co-infection with other viruses. If result is PRESUMPTIVE POSTIVE SARS-CoV-2 nucleic acids MAY BE PRESENT.   A presumptive positive result was obtained on the submitted specimen  and confirmed on repeat testing.  While 2019 novel coronavirus  (SARS-CoV-2) nucleic acids may be present in the submitted sample  additional confirmatory testing may be necessary for epidemiological  and / or clinical management purposes  to differentiate between  SARS-CoV-2 and other Sarbecovirus currently known to infect humans.  If clinically indicated additional testing with an alternate test  methodology [phone]) is advised. The SARS-CoV-2 RNA is generally  detectable in upper and lower respiratory sp ecimens during the acute  phase of infection. The expected result is Negative. Fact Sheet for Patients:  StrictlyIdeas.no Fact Sheet for Healthcare Providers: BankingDealers.co.za This test is not yet approved or cleared by the [place] FDA and has been authorized for detection and/or diagnosis of SARS-CoV-2 by FDA under an Emergency Use Authorization (EUA).  This EUA will remain in effect (meaning this test can be used) for the duration of the COVID-19 declaration under Section 564(b)(1) of the Act, 21 U.S.C. section 360bbb-3(b)(1), unless the authorization is terminated or revoked sooner. Performed at [facility], [place] [address], [place], [place] [address]   Blood Culture (routine x 2)     Status: None   Collection Time: 06/18/18  9:01 PM  Result Value Ref Range Status   Specimen Description   Final    BLOOD LEFT ARM Performed at [place] [address], [place], [place] [address]    Special Requests   Final    BOTTLES DRAWN AEROBIC AND ANAEROBIC Blood Culture adequate volume Performed at [place] [address], [place], [place] [address]    Culture    Final    NO GROWTH 5 DAYS Performed at [facility], [place] [address], [place], [place] [address]    Report Status 06/23/2018 FINAL  Final  Blood Culture (routine x 2)     Status: None   Collection Time: 06/18/18  9:01 PM  Result Value Ref Range Status   Specimen Description   Final    BLOOD RIGHT ARM Performed at [place] [address], [place], [place] [address]    Special Requests   Final    BOTTLES DRAWN AEROBIC AND ANAEROBIC Blood Culture adequate volume Performed at [place] [address] [place],  [address]    Culture   Final    NO GROWTH 5 DAYS Performed  at [facility], [place] [address] [place], [place] [address]    Report Status 06/23/2018 FINAL  Final  MRSA PCR Screening     Status: None   Collection Time: 06/19/18  2:53 AM  Result Value Ref Range Status   MRSA by PCR NEGATIVE NEGATIVE Final    Comment:        The GeneXpert MRSA Assay (FDA approved for NASAL specimens only), is one component of a comprehensive MRSA colonization surveillance program. It is not intended to diagnose MRSA infection nor to guide or monitor treatment for MRSA infections. Performed at [facility], [place] [address] [place], [place] [address]   SARS Coronavirus 2 (CEPHEID- Performed in [place] hospital lab), Hosp Order     Status: Abnormal   Collection Time: 06/19/18  6:15 AM  Result Value Ref Range Status   SARS Coronavirus 2 POSITIVE (A) NEGATIVE Final    Comment: RESULT CALLED TO, READ BACK BY AND VERIFIED WITH: NELSON,A. RN @0809  ON 5.19.2020 BY NMCCOY (NOTE) If result is NEGATIVE SARS-CoV-2 target nucleic acids are NOT DETECTED. The SARS-CoV-2 RNA is generally detectable in upper and lower  respiratory specimens during the acute phase of infection. The lowest  concentration of SARS-CoV-2 viral copies this assay can detect is 250  copies / mL. A negative result does not preclude SARS-CoV-2  infection  and should not be used as the sole basis for treatment or other  patient management decisions.  A negative result may occur with  improper specimen collection / handling, submission of specimen other  than nasopharyngeal swab, presence of viral mutation(s) within the  areas targeted by this assay, and inadequate number of viral copies  (<250 copies / mL). A negative result must be combined with clinical  observations, patient history, and epidemiological information. If result is POSITIVE SARS-CoV-2 target nucleic acids are DETECT ED. The SARS-CoV-2 RNA is generally detectable in upper and lower  respiratory specimens during the acute phase of infection.  Positive  results are indicative of active infection with SARS-CoV-2.  Clinical  correlation with patient history and other diagnostic information is  necessary to determine patient infection status.  Positive results do  not rule out bacterial infection or co-infection with other viruses. If result is PRESUMPTIVE POSTIVE SARS-CoV-2 nucleic acids MAY BE PRESENT.   A presumptive positive result was obtained on the submitted specimen  and confirmed on repeat testing.  While 2019 novel coronavirus  (SARS-CoV-2) nucleic acids may be present in the submitted sample  additional confirmatory testing may be necessary for epidemiological  and / or clinical management purposes  to differentiate between  SARS-CoV-2 and other Sarbecovirus currently known to infect humans.  If clinically indicated additional testing with an alternate test  methodology ([id number]) is advised. The SARS-CoV-2 RNA is generally  detectable in upper and lower respiratory specimens during the acute  phase of infection. The expected result is Negative. Fact Sheet for Patients:  StrictlyIdeas.no Fact Sheet for Healthcare Providers: BankingDealers.co.za This test is not yet approved or cleared by the [place]  FDA and has been authorized for detection and/or diagnosis of SARS-CoV-2 by FDA under an Emergency Use Authorization (EUA).  This EUA will remain in effect (meaning this test can be used) for the duration of the COVID-19 declaration under Section 564(b)(1) of the Act, 21 U.S.C. section 360bbb-3(b)(1), unless the authorization is terminated or revoked sooner. Performed at [facility], [place] [address] [place], [place] [address]  Respiratory Panel by PCR     Status: None   Collection Time: 06/21/18  3:15 PM  Result Value Ref Range Status   Adenovirus NOT DETECTED NOT DETECTED Final   Coronavirus 229E NOT DETECTED NOT DETECTED Final    Comment: (NOTE) The Coronavirus on the Respiratory Panel, DOES NOT test for the novel  Coronavirus (2019 nCoV)    Coronavirus HKU1 NOT DETECTED NOT DETECTED Final   Coronavirus NL63 NOT DETECTED NOT DETECTED Final   Coronavirus OC43 NOT DETECTED NOT DETECTED Final   Metapneumovirus NOT DETECTED NOT DETECTED Final   Rhinovirus / Enterovirus NOT DETECTED NOT DETECTED Final   Influenza A NOT DETECTED NOT DETECTED Final   Influenza B NOT DETECTED NOT DETECTED Final   Parainfluenza Virus 1 NOT DETECTED NOT DETECTED Final   Parainfluenza Virus 2 NOT DETECTED NOT DETECTED Final   Parainfluenza Virus 3 NOT DETECTED NOT DETECTED Final   Parainfluenza Virus 4 NOT DETECTED NOT DETECTED Final   Respiratory Syncytial Virus NOT DETECTED NOT DETECTED Final   Bordetella pertussis NOT DETECTED NOT DETECTED Final   Chlamydophila pneumoniae NOT DETECTED NOT DETECTED Final   Mycoplasma pneumoniae NOT DETECTED NOT DETECTED Final    Comment: Performed at [facility], [address] [place], [place] [address]    Radiology Reports Ct Head Wo Contrast  Result Date: 06/19/2018 CLINICAL DATA:  85 year old male with shortness of breath weakness, altered mental status. Fever of 101.4 F. EXAM: CT HEAD WITHOUT CONTRAST TECHNIQUE: Contiguous axial  images were obtained from the base of the skull through the vertex without intravenous contrast. COMPARISON:  Head CT 01/14/2017, MRI 12/26/2016 and earlier. FINDINGS: Brain: Stable cerebral volume. Stable ventricle size and configuration. No midline shift, mass effect, or evidence of intracranial mass lesion. No acute intracranial hemorrhage identified. Stable gray-white matter differentiation throughout the brain. No cortically based acute infarct identified. Vascular: Calcified atherosclerosis at the skull base. No suspicious intracranial vascular hyperdensity. Skull: No acute osseous abnormality identified. Sinuses/Orbits: Visualized paranasal sinuses and mastoids are stable and well pneumatized. Other: Stable orbit and scalp soft tissues. IMPRESSION: Stable non contrast CT appearance of the brain since 2018. No acute intracranial abnormality. Electronically Signed   By: Genevie Ann M.D.   On: 06/19/2018 00:24   Dg Chest Portable 1 View  Result Date: 06/18/2018 CLINICAL DATA:  85 year old male with recent pneumonia. Fever and shortness of breath. COVID-19 status is pending. EXAM: PORTABLE CHEST 1 VIEW COMPARISON:  01/14/2017 and earlier. FINDINGS: Portable AP semi upright view at 2042 hours. Ill-defined abnormal right upper lobe opacity near the hilum is new since 2018. there is also patchy confluent opacity at the right lateral costophrenic angle. There is mild superimposed asymmetric indistinct and interstitial opacity in both lungs. Chronic prosthetic cardiac valve. Stable cardiac size and mediastinal contours. Calcified aortic atherosclerosis. Visualized tracheal air column is within normal limits. No pneumothorax or pleural effusion. Negative visible bowel gas pattern. No acute osseous abnormality identified. IMPRESSION: Abnormal right greater than left pulmonary opacity most confluent in the right upper lobe opacity. Consider acute pneumonia versus viral/atypical respiratory infection. No pleural effusion.  Electronically Signed   By: Genevie Ann M.D.   On: 06/18/2018 21:43

## 2018-06-26 NOTE — Progress Notes (Signed)
Occupational Therapy Treatment Patient Details Name: Marvin Wagner MRN: [id number] DOB: 1930-05-13 Today's Date: 06/26/2018    History of present illness 85 y.o. male admitted 06/19/18 due to fever and confusion.  Pt dx with COVID after second test (initial test was negative) came back positive. Pt also with LFT elevation, AKI, hypernatremia, hypokalemia, acute metabolic encephalopathy.  Pt with other significant PMH of HTN, CAD, aortic stenosis, dementia, and aortic valve replacement.     OT comments  Pt progressing towards established OT goals. Pt enjoyed listening to Laqueta Linden and demonstrated increased engagement to perform stand pivot transfer to recliner with Mod-Max A +2 and RW. Pt participating in self feeding once upright in recliner with cues for redirection back to task. Continue to recommend dc to SNF and will continue to follow acutely as admitted.    Follow Up Recommendations  SNF;Supervision/Assistance - 24 hour    Equipment Recommendations  Other (comment)(Defer to next venue)    Recommendations for Other Services PT consult    Precautions / Restrictions Precautions Precautions: Fall Restrictions Weight Bearing Restrictions: No       Mobility Bed Mobility Overal bed mobility: Needs Assistance Bed Mobility: Supine to Sit     Supine to sit: Mod assist;HOB elevated;+2 for physical assistance     General bed mobility comments: Pt requiring assistance to bring BLEs off EOB. Pt then attending to task and able to push himself up into seated position  Transfers Overall transfer level: Needs assistance Equipment used: Rolling walker (2 wheeled) Transfers: Sit to/from [facility] Sit to Stand: Mod assist Stand pivot transfers: Max assist;+2 safety/equipment       General transfer comment: Pt requiring initial Mod A to power up into standing. Pt then requiring Max A to pivot to recliner for RW management and posterior lean    Balance Overall  balance assessment: Needs assistance Sitting-balance support: Feet supported;Bilateral upper extremity supported;No upper extremity supported Sitting balance-Leahy Scale: Fair Sitting balance - Comments: Sitting at EOB with MIn Guard for safety   Standing balance support: Bilateral upper extremity supported Standing balance-Leahy Scale: Poor Standing balance comment: Requiring UE support and physical A                           ADL either performed or assessed with clinical judgement   ADL Overall ADL's : Needs assistance/impaired Eating/Feeding: Maximal assistance;Minimal assistance;Sitting Eating/Feeding Details (indicate cue type and reason): While sitting in recliner, pt attending to banana and grabbing to eat. Pt requiring cues to redirect back to eating banana.                      Toilet Transfer: Maximal assistance;+2 for safety/equipment;Stand-pivot;RW(simulated to recliner) Toilet Transfer Details (indicate cue type and reason): Pt performing stand pivot with Max A for RW management and slight posterior lean.          Functional mobility during ADLs: Maximal assistance;+2 for safety/equipment;Rolling walker(stand pivot only) General ADL Comments: Pt presenting with decreased activity tolerance and poor cognition. Pt with difficulty following cues. Shaking OT's hand appropiately      Vision       Perception     Praxis      Cognition Arousal/Alertness: Awake/alert Behavior During Therapy: WFL for tasks assessed/performed Overall Cognitive Status: History of cognitive impairments - at baseline  General Comments: Baseline dementia. From memory care unit. Presenting with increased engagement this session.        Exercises     Shoulder Instructions       General Comments SpO2 99-94% on RA.    Pertinent Vitals/ Pain       Pain Assessment: Faces Faces Pain Scale: Hurts little more Pain Location:  with peri care Pain Descriptors / Indicators: Grimacing;Guarding Pain Intervention(s): Monitored during session;Limited activity within patient's tolerance;Repositioned  Home Living                                          Prior Functioning/Environment              Frequency  Min 2X/week        Progress Toward Goals  OT Goals(current goals can now be found in the care plan section)  Progress towards OT goals: Progressing toward goals  Acute Rehab OT Goals Patient Stated Goal: unable to state OT Goal Formulation: Patient unable to participate in goal setting Time For Goal Achievement: 07/08/18 Potential to Achieve Goals: Good ADL Goals Pt Will Perform Grooming: with min assist;sitting Pt Will Transfer to Toilet: with min assist;stand pivot transfer;bedside commode Additional ADL Goal #1: Pt will perform bed mobility with Min A in preparation for ADLs Additional ADL Goal #2: Pt will sustain attention to simple ADLs with Min cues  Plan Discharge plan remains appropriate    Co-evaluation                 AM-PAC OT "6 Clicks" Daily Activity     Outcome Measure   Help from another person eating meals?: A Lot Help from another person taking care of personal grooming?: A Lot Help from another person toileting, which includes using toliet, bedpan, or urinal?: A Lot Help from another person bathing (including washing, rinsing, drying)?: A Lot Help from another person to put on and taking off regular upper body clothing?: A Lot Help from another person to put on and taking off regular lower body clothing?: Total 6 Click Score: 11    End of Session Equipment Utilized During Treatment: Gait belt;Rolling walker  OT Visit Diagnosis: Unsteadiness on feet (R26.81);Other abnormalities of gait and mobility (R26.89);Muscle weakness (generalized) (M62.81);Other symptoms and signs involving cognitive function   Activity Tolerance Patient tolerated treatment  well   Patient Left in chair;with call bell/phone within reach;with chair alarm set   Nurse Communication Mobility status        Time: 6144-3154 OT Time Calculation (min): 30 min  Charges: OT General Charges $OT Visit: 1 Visit OT Treatments $Self Care/Home Management : 23-37 mins  [place], OTR/L Acute Rehab Pager: [phone] Office: [place] 06/26/2018, 5:09 PM

## 2018-06-26 NOTE — NC FL2 (Signed)
[place] MEDICAID FL2 LEVEL OF CARE SCREENING TOOL     IDENTIFICATION  Patient Name: Marvin Wagner Birthdate: 1930-05-31 Sex: male Admission Date (Current Location): 06/18/2018  [facility] [place] Number:  Herbalist and Address:  [facility] [place] [address] [place], [place] [address]      Provider Number: [id number]  Attending Physician Name and Address:  Jonetta Osgood, MD  Relative Name and Phone Number:  Riddick Nuon; [phone]; daughter    Current Level of Care: Hospital Recommended Level of Care: [place] Prior Approval Number:    Date Approved/Denied:   PASRR Number: [id number]  Discharge Plan: SNF    Current Diagnoses: Patient Active Problem List   Diagnosis Date Noted  . Community acquired pneumonia 06/19/2018  . Acute encephalopathy 06/19/2018  . CAP (community acquired pneumonia) 06/19/2018  . Acute blood loss anemia 02/27/2016  . UTI (urinary tract infection) 02/27/2016  . Pressure injury of skin 02/25/2016  . Hematoma of chest wall, left, initial encounter 02/24/2016  . Fever 02/24/2016  . Supratherapeutic INR 02/24/2016  . Dementia ([place]) 03/04/2015  . Hypotension 12/03/2014  . Weight loss, unintentional 10/17/2013  . History of elevated PSA 04/25/2013  . Encounter for therapeutic drug monitoring 02/26/2013  . Long term current use of anticoagulant therapy 12/19/2012  . Other vitamin B12 deficiency anemia 02/29/2012  . Memory disorder 02/14/2012  . Hx of CABG 07/01/2010  . Hyperlipidemia 07/01/2010  . Benign hypertensive heart disease without heart failure 07/01/2010    Orientation RESPIRATION BLADDER Height & Weight     (disoriented x 4)  Normal Incontinent Weight: 178 lb 9.2 oz (81 kg) Height:  6' (182.9 cm)  BEHAVIORAL SYMPTOMS/MOOD NEUROLOGICAL BOWEL NUTRITION STATUS      Incontinent Diet(see DC summary)  AMBULATORY STATUS COMMUNICATION OF NEEDS Skin   Extensive Assist Verbally  Normal                       Personal Care Assistance Level of Assistance  Bathing, Feeding, Dressing Bathing Assistance: Maximum assistance Feeding assistance: Maximum assistance Dressing Assistance: Maximum assistance     Functional Limitations Info  Sight, Hearing, Speech Sight Info: Adequate Hearing Info: Adequate Speech Info: Impaired(dysarthria)    SPECIAL CARE FACTORS FREQUENCY  PT (By licensed PT), OT (By licensed OT)     PT Frequency: 5x/wk OT Frequency: 5x/wk            Contractures Contractures Info: Not present    Additional Factors Info  Code Status, Allergies, Psychotropic Code Status Info: DNR Allergies Info: Penicillins Psychotropic Info: Aricept 10mg  daily         Current Medications (06/26/2018):  This is the current hospital active medication list Current Facility-Administered Medications  Medication Dose Route Frequency Provider Last Rate Last Dose  . acetaminophen (TYLENOL) tablet 650 mg  650 mg Oral Q6H PRN Rise Patience, MD   650 mg at 06/19/18 1206   Or  . acetaminophen (TYLENOL) suppository 650 mg  650 mg Rectal Q6H PRN Rise Patience, MD      . aspirin EC tablet 81 mg  81 mg Oral Daily Rise Patience, MD   81 mg at 06/26/18 0936  . atorvastatin (LIPITOR) tablet 40 mg  40 mg Oral Daily Rise Patience, MD   40 mg at 06/26/18 0936  . donepezil (ARICEPT) tablet 10 mg  10 mg Oral QHS Rise Patience, MD   10 mg at  06/25/18 2154  . enoxaparin (LOVENOX) injection 40 mg  40 mg Subcutaneous Q24H Rise Patience, MD   40 mg at 06/26/18 6808  . LORazepam (ATIVAN) injection 1 mg  1 mg Intravenous Q4H PRN Patrecia Pour, MD      . MEDLINE mouth rinse  15 mL Mouth Rinse BID Darliss Cheney, MD   15 mL at 06/26/18 0942  . Melatonin TABS 1 mg  1 mg Oral QHS Rise Patience, MD   1 mg at 06/25/18 2155  . memantine (NAMENDA) tablet 10 mg  10 mg Oral BID Rise Patience, MD   10 mg at 06/26/18 8110  . metoprolol  tartrate (LOPRESSOR) tablet 12.5 mg  12.5 mg Oral Daily Rise Patience, MD   12.5 mg at 06/26/18 3159  . ondansetron (ZOFRAN) tablet 4 mg  4 mg Oral Q6H PRN Rise Patience, MD       Or  . ondansetron [facility]) injection 4 mg  4 mg Intravenous Q6H PRN Rise Patience, MD      . risperiDONE (RISPERDAL) tablet 0.25 mg  0.25 mg Oral Q6H PRN Rise Patience, MD   0.25 mg at 06/21/18 1053     Discharge Medications: Please see discharge summary for a list of discharge medications.  Relevant Imaging Results:  Relevant Lab Results:   Additional Information SS#: [id number]  Geralynn Ochs, LCSW

## 2018-06-26 NOTE — TOC Progression Note (Addendum)
Transition of Care [facility]) - Progression Note    Patient Details  Name: Marvin Wagner MRN: [id number] Date of Birth: [birth year]  Transition of Care [facility]) CM/SW [place], [place] Phone Number: 06/26/2018, 12:25 PM  Clinical Narrative:    12:25pm- CSW spoke with Carloyn Jaeger, Development worker, international aid at [facility], [phone]. They are unable to have therapy services in the building at this time, due to pts deconditioning they feel it is best for him to have SNF level therapies prior to return.   They will contact the family and discuss this with them also.  Updated MD.   1:14pm- CSW spoke with legal guardian [name], she has sent paperwork to Network engineer at [employer]. She works with [employer] for [employer] and assists with decision making. Pt daughter Katharine Look in [place] but is involved but cannot make final decisions. She understands [facility] can take pt and would like to discuss 24/7 private sitters at [facility]. Will put her in touch with Suanne Marker, admissions director at [facility] to discuss.    Expected Discharge Plan: Assisted Living(Memory Care) Barriers to Discharge: Other (comment)(from Memeory Cre at [facility])  Expected Discharge Plan and Services Expected Discharge Plan: Assisted Living(Memory Care) Living arrangements for the past 2 months: [place] Expected Discharge Date: (unknown)                    Social Determinants of Health (SDOH) Interventions    Readmission Risk Interventions No flowsheet data found.

## 2018-06-27 LAB — GLUCOSE, CAPILLARY
Glucose-Capillary: 94 mg/dL (ref 70–99)
Glucose-Capillary: 93 mg/dL (ref 70–99)
Glucose-Capillary: 96 mg/dL (ref 70–99)
Glucose-Capillary: 120 mg/dL — ABNORMAL HIGH (ref 70–99)

## 2018-06-27 NOTE — Progress Notes (Signed)
Physical Therapy Treatment Patient Details Name: Marvin Wagner MRN: [id number] DOB: 11/05/1930 Today's Date: 06/27/2018    History of Present Illness 85 y.o. male admitted 06/19/18 due to fever and confusion.  Pt dx with COVID after second test (initial test was negative) came back positive. Pt also with LFT elevation, AKI, hypernatremia, hypokalemia, acute metabolic encephalopathy.  Pt with other significant PMH of HTN, CAD, aortic stenosis, dementia, and aortic valve replacement.      PT Comments    Pt was able to stand and get to the recliner chair with RW and two person assist today.  We stood again from the recliner and "danced" to music Kingsley Spittle) for 1-2 mins.  He is making steady progress.     Follow Up Recommendations  Other (comment)     Equipment Recommendations  Wheelchair (measurements PT);Wheelchair cushion (measurements PT);Hospital bed    Recommendations for Other Services   NA     Precautions / Restrictions Precautions Precautions: Fall    Mobility  Bed Mobility Overal bed mobility: Needs Assistance Bed Mobility: Supine to Sit     Supine to sit: Mod assist;HOB elevated     General bed mobility comments: One person mod assist to come to sitting EOB.  Multimodal cues and hand over hand assist to find railing to help pt pull up to sitting.  HOB elevated ~45 degrees.    Transfers Overall transfer level: Needs assistance Equipment used: Rolling walker (2 wheeled) Transfers: Sit to/from [facility] Sit to Stand: +2 physical assistance;Mod assist;From elevated surface Stand pivot transfers: +2 physical assistance;Mod assist       General transfer comment: Two person heavy mod assist to stand from bed and then again from recliner chair to RW.  Pt able, if given time to get his feet under him (anterior of his body initially) and take some shuffling steps with posterior preference.           Balance Overall balance assessment: Needs  assistance Sitting-balance support: Feet supported;Bilateral upper extremity supported Sitting balance-Leahy Scale: Fair Sitting balance - Comments: close supervision EOB once feet on the ground.  Postural control: Posterior lean Standing balance support: Bilateral upper extremity supported Standing balance-Leahy Scale: Poor Standing balance comment: needs external support from RW and two therapists. Pt was able to stand a few minutes on his second attempt and "dance" i.e. weight shift from side to side to music in his RW.                             Cognition Arousal/Alertness: Awake/alert Behavior During Therapy: WFL for tasks assessed/performed Overall Cognitive Status: History of cognitive impairments - at baseline                                 General Comments: Baseline dementia. From memory care unit. Unsure of baseline cognition and if this is worse      Exercises General Exercises - Upper Extremity Elbow Flexion: AAROM;Both;10 reps Elbow Extension: AAROM;Both;10 reps        Pertinent Vitals/Pain Pain Assessment: Faces Faces Pain Scale: Hurts little more Pain Location: generalized with handling of legs Pain Descriptors / Indicators: Grimacing;Guarding Pain Intervention(s): Limited activity within patient's tolerance;Monitored during session;Repositioned           PT Goals (current goals can now be found in the care plan section) Acute Rehab PT Goals Patient  Stated Goal: unable to state Progress towards PT goals: Progressing toward goals    Frequency    Min 3X/week      PT Plan Current plan remains appropriate       AM-PAC PT "6 Clicks" Mobility   Outcome Measure  Help needed turning from your back to your side while in a flat bed without using bedrails?: A Lot Help needed moving from lying on your back to sitting on the side of a flat bed without using bedrails?: A Lot Help needed moving to and from a bed to a chair  (including a wheelchair)?: A Lot Help needed standing up from a chair using your arms (e.g., wheelchair or bedside chair)?: A Lot Help needed to walk in hospital room?: Total Help needed climbing 3-5 steps with a railing? : Total 6 Click Score: 10    End of Session Equipment Utilized During Treatment: Gait belt Activity Tolerance: Patient tolerated treatment well Patient left: in chair;with call bell/phone within reach;with chair alarm set Nurse Communication: Mobility status PT Visit Diagnosis: Muscle weakness (generalized) (M62.81);Difficulty in walking, not elsewhere classified (R26.2)     Time: 2707-8675 PT Time Calculation (min) (ACUTE ONLY): 25 min  Charges:  $Therapeutic Activity: 23-37 mins                    Gaylan Fauver B. Alayja Armas, PT, DPT  Acute Rehabilitation [phone] pager #[phone] office   06/27/2018, 5:43 PM

## 2018-06-27 NOTE — Progress Notes (Signed)
PROGRESS NOTE                                                                                                                                                                                                             Patient Demographics:    Marvin Wagner, is a 85 y.o. male, DOB - 26-May-1930, OZD:[id number]  Outpatient Primary MD for the patient is Burchette, Alinda Sierras, MD    LOS - 8  Chief Complaint  Patient presents with  . Shortness of Breath       Brief Narrative: Patient is a 85 y.o. male with PMHx of dementia, AFib, HTN, and CAD who presented with confusion and fever found to have covid-19.   Subjective:    Marvin Wagner was sleeping comfortably when I walked in-he is still pleasantly confused.  No major events overnight per RN.   Assessment  & Plan :   COVID-19 viral pneumonia: Improved-patient is s/p Actemra and steroids.  Inflammatory markers have trended down, patient is on room air.  Awaiting repeat COVID-19 test before appropriate disposition can be made.  COVID-19 Labs:  No results for input(s): DDIMER, FERRITIN, LDH, CRP in the last 72 hours.  Lab Results  Component Value Date   SARSCOV2NAA POSITIVE (A) 06/19/2018   [place] NEGATIVE 06/18/2018     COVID-19 Medications: 5/20>> Actemra 5/19>> 5/22>> Solu-Medrol  Transaminitis: Probably secondary to COVID-19 or from Actemra-continuous to slowly downtrend.  Follow periodically.   AKI: Resolved-likely hemodynamically mediated  Hypernatremia: Improved-monitor periodically.  Acute metabolic encephalopathy: Secondary to COVID-19 infection-improved with supportive care.  Has dementia at baseline-suspect back to usual baseline.  Chronic atrial fibrillation: Rate controlled with metoprolol.  Not a candidate for anticoagulation.  CAD: No anginal symptoms-continue aspirin/metoprolol and statin  Dementia with delirium: Pleasantly confused-suspect not  far from baseline-continue Aricept/Namenda and Risperdal  Dysphagia: Probably related to dementia-evaluated by SLP-continue dysphagia 3 diet    ABG:    Component Value Date/Time   PHART 7.371 12/17/2008 2024   PCO2ART 42.2 12/17/2008 2024   PO2ART 162.0 (H) 12/17/2008 2024   HCO3 24.2 (H) 12/17/2008 2024   TCO2 22 12/17/2008 2028   ACIDBASEDEF 1.0 12/17/2008 2024   O2SAT 99.0 12/17/2008 2024    Condition - stable  Family Communication  : Spoke with patient's POA over the phone this morning.  Code Status :  DNR  Diet : Dysphagia 3 diet  Disposition Plan  :  Remain inpatient-SNF vs ALF over the next few days-social worker following-await repeat COVID testing  Consults  :  None  Procedures  :  None  DVT Prophylaxis  :  Lovenox   Lab Results  Component Value Date   PLT 214 06/25/2018    Inpatient Medications  Scheduled Meds: . aspirin EC  81 mg Oral Daily  . atorvastatin  40 mg Oral Daily  . donepezil  10 mg Oral QHS  . enoxaparin (LOVENOX) injection  40 mg Subcutaneous Q24H  . mouth rinse  15 mL Mouth Rinse BID  . Melatonin  1 mg Oral QHS  . memantine  10 mg Oral BID  . metoprolol tartrate  12.5 mg Oral Daily   Continuous Infusions: PRN Meds:.acetaminophen **OR** acetaminophen, LORazepam, ondansetron **OR** ondansetron (ZOFRAN) IV, risperiDONE  Antibiotics  :    Anti-infectives (From admission, onward)   Start     Dose/Rate Route Frequency Ordered Stop   06/19/18 2000  cefTRIAXone (ROCEPHIN) 1 g in sodium chloride 0.9 % 100 mL IVPB  Status:  Discontinued     1 g 200 mL/hr over 30 Minutes Intravenous Every 24 hours 06/19/18 0014 06/20/18 1811   06/19/18 2000  azithromycin (ZITHROMAX) 500 mg in sodium chloride 0.9 % 250 mL IVPB  Status:  Discontinued     500 mg 250 mL/hr over 60 Minutes Intravenous Every 24 hours 06/19/18 0014 06/20/18 1811   06/18/18 2215  cefTRIAXone (ROCEPHIN) 1 g in sodium chloride 0.9 % 100 mL IVPB     1 g 200 mL/hr over 30 Minutes  Intravenous  Once 06/18/18 2209 06/19/18 0006   06/18/18 2215  azithromycin (ZITHROMAX) 500 mg in sodium chloride 0.9 % 250 mL IVPB     500 mg 250 mL/hr over 60 Minutes Intravenous  Once 06/18/18 2209 06/19/18 0006       Time Spent in minutes  15   Oren Binet M.D on 06/27/2018 at 10:52 AM  To page go to [email or web address] - use universal password  [facility] -  Office  [phone]   See all Orders from today for further details   Admit date - 06/18/2018    8    Objective:   Vitals:   06/26/18 2309 06/27/18 0317 06/27/18 0842 06/27/18 0932  BP: (!) 135/58 128/74 (!) 145/78   Pulse: 78 67  91  Resp: 18 16    Temp: (!) 97.5 F (36.4 C) 97.6 F (36.4 C) 97.6 F (36.4 C)   TempSrc: Axillary Axillary    SpO2: 98% 93%    Weight:      Height:        Wt Readings from Last 3 Encounters:  06/18/18 81 kg  09/21/17 79.4 kg  02/08/17 77.9 kg     Intake/Output Summary (Last 24 hours) at 06/27/2018 1052 Last data filed at 06/26/2018 1845 Gross per 24 hour  Intake 120 ml  Output -  Net 120 ml     Physical Exam General appearance: Awake-pleasantly confused.  Not in any distress.   Eyes: No scleral icterus HEENT: Atraumatic and Normocephalic Neck: supple Resp: Clear to auscultation anteriorly CVS: S1 S2 regular GI: Bowel sounds present, Non tender and not distended with no gaurding, rigidity or rebound. Extremities: No edema Neurology: Moving all 4 extremities-appears to be nonfocal.   Data Review:    CBC Recent Labs  Lab 06/21/18 0404 06/22/18 0600 06/23/18 0331 06/24/18 0656 06/25/18 0947  WBC 9.4 12.9* 12.5* 8.2 8.0  HGB 11.0* 11.2* 10.7* 12.2* 11.6*  HCT 33.9* 34.4* 33.0* 35.4* 36.0*  PLT 186 227 220 205 214  MCV 100.0 99.1 100.0 97.3 100.3*  MCH 32.4 32.3 32.4 33.5 32.3  MCHC 32.4 32.6 32.4 34.5 32.2  RDW 12.7 12.5 12.6 12.4 12.4  LYMPHSABS 1.3 0.7 0.9 1.2 1.5  MONOABS 0.5 0.6 0.7 0.3 0.4  EOSABS 0.0 0.0 0.0 0.0 0.0  BASOSABS 0.0 0.0  0.0 0.0 0.0    Chemistries  Recent Labs  Lab 06/21/18 0404 06/22/18 0600 06/23/18 0331 06/24/18 0656 06/25/18 0313 06/26/18 0435  NA 146* 148* 146* 142 146*  --   K 3.8 3.4* 3.8 3.6 3.8  --   CL 115* 115* 116* 110 109  --   CO2 21* 23 22 24 27   --   GLUCOSE 164* 148* 122* 101* 102*  --   BUN 40* 47* 39* 26* 31*  --   CREATININE 0.95 0.96 0.91 0.91 0.97 1.07  CALCIUM 7.8* 8.0* 7.9* 7.9* 8.4*  --   MG 3.0* 3.2* 3.0* 2.5*  --   --   AST 83* 67* 95* 96* 75*  --   ALT 59* 64* 88* 112* 105*  --   ALKPHOS 58 66 69 70 67  --   BILITOT 0.6 0.7 0.7 0.6 0.7  --    ------------------------------------------------------------------------------------------------------------------ No results for input(s): CHOL, HDL, LDLCALC, TRIG, CHOLHDL, LDLDIRECT in the last 72 hours.  Lab Results  Component Value Date   HGBA1C  12/15/2008    5.3 (NOTE) The ADA recommends the following therapeutic goal for glycemic control related to Hgb A1c measurement: Goal of therapy: <6.5 Hgb A1c  Reference: American Diabetes Association: Clinical Practice Recommendations 2010, Diabetes Care, 2010, 33: (Suppl  1).   ------------------------------------------------------------------------------------------------------------------ No results for input(s): TSH, T4TOTAL, T3FREE, THYROIDAB in the last 72 hours.  Invalid input(s): FREET3 ------------------------------------------------------------------------------------------------------------------ No results for input(s): VITAMINB12, FOLATE, FERRITIN, TIBC, IRON, RETICCTPCT in the last 72 hours.  Coagulation profile No results for input(s): INR, PROTIME in the last 168 hours.  No results for input(s): DDIMER in the last 72 hours.  Cardiac Enzymes No results for input(s): CKMB, TROPONINI, MYOGLOBIN in the last 168 hours.  Invalid input(s): CK ------------------------------------------------------------------------------------------------------------------     Component Value Date/Time   BNP 175.0 (H) 01/14/2017 1058    Micro Results Recent Results (from the past 240 hour(s))  Urine culture     Status: None   Collection Time: 06/18/18  8:48 PM  Result Value Ref Range Status   Specimen Description   Final    URINE, RANDOM Performed at [facility], [place] [address], [place], [place] [address]    Special Requests   Final    NONE Performed at [facility], [place] [address] [place], [place] [address]    Culture   Final    NO GROWTH Performed at [facility], [place] [address], [place], [place] [address]    Report Status 06/19/2018 FINAL  Final  SARS Coronavirus 2 (CEPHEID- Performed in [place] hospital lab), Hosp Order     Status: None   Collection Time: 06/18/18  8:53 PM  Result Value Ref Range Status   SARS Coronavirus 2 NEGATIVE NEGATIVE Final    Comment: (NOTE) If result is NEGATIVE SARS-CoV-2 target nucleic acids are NOT DETECTED. The SARS-CoV-2 RNA is generally detectable in upper and lower  respiratory specimens during the acute phase of infection. The lowest  concentration of SARS-CoV-2 viral copies this assay can detect is 250  copies / mL. A negative result does not preclude SARS-CoV-2 infection  and should not be used as the sole basis for treatment or other  patient management decisions.  A negative result may occur with  improper specimen collection / handling, submission of specimen other  than nasopharyngeal swab, presence of viral mutation(s) within the  areas targeted by this assay, and inadequate number of viral copies  (<250 copies / mL). A negative result must be combined with clinical  observations, patient history, and epidemiological information. If result is POSITIVE SARS-CoV-2 target nucleic acids are DETECTED. The SARS-CoV-2 RNA is generally detectable in upper and lower  respiratory specimens dur ing the acute phase of infection.  Positive  results are indicative  of active infection with SARS-CoV-2.  Clinical  correlation with patient history and other diagnostic information is  necessary to determine patient infection status.  Positive results do  not rule out bacterial infection or co-infection with other viruses. If result is PRESUMPTIVE POSTIVE SARS-CoV-2 nucleic acids MAY BE PRESENT.   A presumptive positive result was obtained on the submitted specimen  and confirmed on repeat testing.  While 2019 novel coronavirus  (SARS-CoV-2) nucleic acids may be present in the submitted sample  additional confirmatory testing may be necessary for epidemiological  and / or clinical management purposes  to differentiate between  SARS-CoV-2 and other Sarbecovirus currently known to infect humans.  If clinically indicated additional testing with an alternate test  methodology [phone]) is advised. The SARS-CoV-2 RNA is generally  detectable in upper and lower respiratory sp ecimens during the acute  phase of infection. The expected result is Negative. Fact Sheet for Patients:  StrictlyIdeas.no Fact Sheet for Healthcare Providers: BankingDealers.co.za This test is not yet approved or cleared by the [place] FDA and has been authorized for detection and/or diagnosis of SARS-CoV-2 by FDA under an Emergency Use Authorization (EUA).  This EUA will remain in effect (meaning this test can be used) for the duration of the COVID-19 declaration under Section 564(b)(1) of the Act, 21 U.S.C. section 360bbb-3(b)(1), unless the authorization is terminated or revoked sooner. Performed at [facility], [place] [address] [place], [place] [address]   Blood Culture (routine x 2)     Status: None   Collection Time: 06/18/18  9:01 PM  Result Value Ref Range Status   Specimen Description   Final    BLOOD LEFT ARM Performed at [facility] [facility] [address] [place], [place] [address]     Special Requests   Final    BOTTLES DRAWN AEROBIC AND ANAEROBIC Blood Culture adequate volume Performed at [facility] [address], [place], [place] [address]    Culture   Final    NO GROWTH 5 DAYS Performed at [facility], [place] [address], [place], [place] [address]    Report Status 06/23/2018 FINAL  Final  Blood Culture (routine x 2)     Status: None   Collection Time: 06/18/18  9:01 PM  Result Value Ref Range Status   Specimen Description   Final    BLOOD RIGHT ARM Performed at [facility] [address] [place], [place] [address]    Special Requests   Final    BOTTLES DRAWN AEROBIC AND ANAEROBIC Blood Culture adequate volume Performed at [facility] [address] [place], [place] [address]    Culture  Final    NO GROWTH 5 DAYS Performed at [facility], [place] [address] [place], [place] [address]    Report Status 06/23/2018 FINAL  Final  MRSA PCR Screening     Status: None   Collection Time: 06/19/18  2:53 AM  Result Value Ref Range Status   MRSA by PCR NEGATIVE NEGATIVE Final    Comment:        The GeneXpert MRSA Assay (FDA approved for NASAL specimens only), is one component of a comprehensive MRSA colonization surveillance program. It is not intended to diagnose MRSA infection nor to guide or monitor treatment for MRSA infections. Performed at [facility], [place] [address] [place], [place] [address]   SARS Coronavirus 2 (CEPHEID- Performed in [place] hospital lab), Hosp Order     Status: Abnormal   Collection Time: 06/19/18  6:15 AM  Result Value Ref Range Status   SARS Coronavirus 2 POSITIVE (A) NEGATIVE Final    Comment: RESULT CALLED TO, READ BACK BY AND VERIFIED WITH: NELSON,A. RN @0809  ON 5.19.2020 BY NMCCOY (NOTE) If result is NEGATIVE SARS-CoV-2 target nucleic acids are NOT DETECTED. The SARS-CoV-2 RNA is generally detectable in upper and lower   respiratory specimens during the acute phase of infection. The lowest  concentration of SARS-CoV-2 viral copies this assay can detect is 250  copies / mL. A negative result does not preclude SARS-CoV-2 infection  and should not be used as the sole basis for treatment or other  patient management decisions.  A negative result may occur with  improper specimen collection / handling, submission of specimen other  than nasopharyngeal swab, presence of viral mutation(s) within the  areas targeted by this assay, and inadequate number of viral copies  (<250 copies / mL). A negative result must be combined with clinical  observations, patient history, and epidemiological information. If result is POSITIVE SARS-CoV-2 target nucleic acids are DETECT ED. The SARS-CoV-2 RNA is generally detectable in upper and lower  respiratory specimens during the acute phase of infection.  Positive  results are indicative of active infection with SARS-CoV-2.  Clinical  correlation with patient history and other diagnostic information is  necessary to determine patient infection status.  Positive results do  not rule out bacterial infection or co-infection with other viruses. If result is PRESUMPTIVE POSTIVE SARS-CoV-2 nucleic acids MAY BE PRESENT.   A presumptive positive result was obtained on the submitted specimen  and confirmed on repeat testing.  While 2019 novel coronavirus  (SARS-CoV-2) nucleic acids may be present in the submitted sample  additional confirmatory testing may be necessary for epidemiological  and / or clinical management purposes  to differentiate between  SARS-CoV-2 and other Sarbecovirus currently known to infect humans.  If clinically indicated additional testing with an alternate test  methodology ([id number] [id number]) is advised. The SARS-CoV-2 RNA is generally  detectable in upper and lower respiratory specimens during the acute  phase of infection. The expected result is Negative. Fact  Sheet for Patients:  StrictlyIdeas.no Fact Sheet for Healthcare Providers: BankingDealers.co.za This test is not yet approved or cleared by the [place] FDA and has been authorized for detection and/or diagnosis of SARS-CoV-2 by FDA under an Emergency Use Authorization (EUA).  This EUA will remain in effect (meaning this test can be used) for the duration of the COVID-19 declaration under Section 564(b)(1) of the Act, 21 U.S.C. section 360bbb-3(b)(1), unless the authorization is terminated or revoked sooner. Performed at [facility],  [place] [address] [place], [place] [address]   Respiratory Panel by PCR     Status: None   Collection Time: 06/21/18  3:15 PM  Result Value Ref Range Status   Adenovirus NOT DETECTED NOT DETECTED Final   Coronavirus 229E NOT DETECTED NOT DETECTED Final    Comment: (NOTE) The Coronavirus on the Respiratory Panel, DOES NOT test for the novel  Coronavirus (2019 nCoV)    Coronavirus HKU1 NOT DETECTED NOT DETECTED Final   Coronavirus NL63 NOT DETECTED NOT DETECTED Final   Coronavirus OC43 NOT DETECTED NOT DETECTED Final   Metapneumovirus NOT DETECTED NOT DETECTED Final   Rhinovirus / Enterovirus NOT DETECTED NOT DETECTED Final   Influenza A NOT DETECTED NOT DETECTED Final   Influenza B NOT DETECTED NOT DETECTED Final   Parainfluenza Virus 1 NOT DETECTED NOT DETECTED Final   Parainfluenza Virus 2 NOT DETECTED NOT DETECTED Final   Parainfluenza Virus 3 NOT DETECTED NOT DETECTED Final   Parainfluenza Virus 4 NOT DETECTED NOT DETECTED Final   Respiratory Syncytial Virus NOT DETECTED NOT DETECTED Final   Bordetella pertussis NOT DETECTED NOT DETECTED Final   Chlamydophila pneumoniae NOT DETECTED NOT DETECTED Final   Mycoplasma pneumoniae NOT DETECTED NOT DETECTED Final    Comment: Performed at [facility], [place]. [address], [place],  [address]    Radiology Reports Ct Head Wo  Contrast  Result Date: 06/19/2018 CLINICAL DATA:  85 year old male with shortness of breath weakness, altered mental status. Fever of 101.4 F. EXAM: CT HEAD WITHOUT CONTRAST TECHNIQUE: Contiguous axial images were obtained from the base of the skull through the vertex without intravenous contrast. COMPARISON:  Head CT 01/14/2017, MRI 12/26/2016 and earlier. FINDINGS: Brain: Stable cerebral volume. Stable ventricle size and configuration. No midline shift, mass effect, or evidence of intracranial mass lesion. No acute intracranial hemorrhage identified. Stable gray-white matter differentiation throughout the brain. No cortically based acute infarct identified. Vascular: Calcified atherosclerosis at the skull base. No suspicious intracranial vascular hyperdensity. Skull: No acute osseous abnormality identified. Sinuses/Orbits: Visualized paranasal sinuses and mastoids are stable and well pneumatized. Other: Stable orbit and scalp soft tissues. IMPRESSION: Stable non contrast CT appearance of the brain since 2018. No acute intracranial abnormality. Electronically Signed   By: Genevie Ann M.D.   On: 06/19/2018 00:24   Dg Chest Portable 1 View  Result Date: 06/18/2018 CLINICAL DATA:  85 year old male with recent pneumonia. Fever and shortness of breath. COVID-19 status is pending. EXAM: PORTABLE CHEST 1 VIEW COMPARISON:  01/14/2017 and earlier. FINDINGS: Portable AP semi upright view at 2042 hours. Ill-defined abnormal right upper lobe opacity near the hilum is new since 2018. there is also patchy confluent opacity at the right lateral costophrenic angle. There is mild superimposed asymmetric indistinct and interstitial opacity in both lungs. Chronic prosthetic cardiac valve. Stable cardiac size and mediastinal contours. Calcified aortic atherosclerosis. Visualized tracheal air column is within normal limits. No pneumothorax or pleural effusion. Negative visible bowel gas pattern. No acute osseous abnormality  identified. IMPRESSION: Abnormal right greater than left pulmonary opacity most confluent in the right upper lobe opacity. Consider acute pneumonia versus viral/atypical respiratory infection. No pleural effusion. Electronically Signed   By: Genevie Ann M.D.   On: 06/18/2018 21:43

## 2018-06-27 NOTE — Progress Notes (Signed)
cbg 96 @1220 

## 2018-06-28 LAB — NOVEL CORONAVIRUS, NAA (HOSP ORDER, SEND-OUT TO REF LAB; TAT 18-24 HRS): SARS-CoV-2, NAA: NOT DETECTED

## 2018-06-28 LAB — GLUCOSE, CAPILLARY
Glucose-Capillary: 110 mg/dL — ABNORMAL HIGH (ref 70–99)
Glucose-Capillary: 101 mg/dL — ABNORMAL HIGH (ref 70–99)

## 2018-06-28 MED ORDER — LIP MEDEX EX OINT
TOPICAL_OINTMENT | CUTANEOUS | Status: DC | PRN
  Administered 2018-06-28: 11:00:00 via TOPICAL
  Filled 2018-06-28: qty 7

## 2018-06-28 NOTE — Progress Notes (Signed)
Patient has negative COVID test, CSW faxed to [facility]. They report being able to take patient back once they set up caregivers with family. They will contact CSW when this is complete.   [place], [place]

## 2018-06-28 NOTE — Progress Notes (Signed)
CSW spoke with Suanne Marker at [facility] regarding patient not needing sitters at [facility] and has been pleasantly confused. Suanne Marker agrees she believes they can accept patient, however it is legal guardian [name] who was advocating for sitters.   CSW called [name] who reports she believes patient needs more care than what [facility] can provide. She reports 6 to 1 patient to nurse ratio at [facility] is not enough, that patient is a fall risk. CSW explained that most patients that go to SNF are fall risk and attempted to educate Ann on SNF level of care and patients that go there, including those with dementia. Lelon Frohlich believes patient has an extraordinary fall risk that we have not seen before, CSW attempted to explain multiple times that patient's needs are general to those of many patients we see go to SNF for short term rehab. Ann did not receive or acknowledge CSW's information provided.   CSW following up with [facility] on staff's ability to accommodate patient and hopefully ease some of [name]' concerns.  CSW notifying MD as well.   [place], [place]

## 2018-06-28 NOTE — Progress Notes (Signed)
Occupational Therapy Treatment Patient Details Name: Marvin Wagner MRN: [id number] DOB: 06/25/1930 Today's Date: 06/28/2018    History of present illness 85 y.o. male admitted 06/19/18 due to fever and confusion.  Pt dx with COVID after second test (initial test was negative) came back positive. Pt also with LFT elevation, AKI, hypernatremia, hypokalemia, acute metabolic encephalopathy.  Pt with other significant PMH of HTN, CAD, aortic stenosis, dementia, and aortic valve replacement.     OT comments  Pt presents supine in bed, sleepy but easily arousable and participating in therapy session given multimodal cues throughout. Pt requiring maxA for bed mobility rolling to L/R multiple times during session as pt with x2 episodes of incontinence, requiring totalA for pericare and assist for changing bed pads. Egressed bed to modified chair position and pt engaging in P/AAROM to bil UE and LE, playing [place] during activity. O2 sats stable on RA during session. Feel SNF recommendation remains appropriate at this time. Will continue to follow acutely.   Follow Up Recommendations  SNF;Supervision/Assistance - 24 hour    Equipment Recommendations  Other (comment)(defer to next venue)    Recommendations for Other Services      Precautions / Restrictions Precautions Precautions: Fall Restrictions Weight Bearing Restrictions: No       Mobility Bed Mobility Overal bed mobility: Needs Assistance Bed Mobility: Rolling Rolling: Max assist         General bed mobility comments: pt requires maxA and multimodal cues to roll towards L/R; completed rolling multiple times during session for pericare and changing of bed pad/linens (x2); egressed bed to chair position as much as possible for additional activity during session (as chair position feature not working properly)  Transfers                      Balance                                            ADL either performed or assessed with clinical judgement   ADL Overall ADL's : Needs assistance/impaired     Grooming: Maximal assistance;Wash/dry face;Sitting;Bed level Grooming Details (indicate cue type and reason): requires max hand over hand assist to initiate washing face, once initiated pt able to carry through with task                     Toileting- Clothing Manipulation and Hygiene: Total assistance;Bed level Toileting - Clothing Manipulation Details (indicate cue type and reason): assist for peri-care at bed level as pt incontinent of urine, noted upon arriaval and with additional episode of incontinence during session       General ADL Comments: pt contineus to have limitations due to weakness, decreased activity tolerance     Vision       Perception     Praxis      Cognition Arousal/Alertness: Awake/alert Behavior During Therapy: WFL for tasks assessed/performed Overall Cognitive Status: History of cognitive impairments - at baseline                                 General Comments: Baseline dementia. From memory care unit. Unsure of baseline cognition and if this is worse        Exercises Exercises: General Upper Extremity;General Lower Extremity;Other exercises General Exercises -  Upper Extremity Shoulder Flexion: AAROM;PROM;Both;10 reps Elbow Flexion: AAROM;Both;10 reps Elbow Extension: AAROM;Both;10 reps Wrist Flexion: AAROM;Both;10 reps Wrist Extension: AAROM;Both;10 reps Digit Composite Flexion: AAROM;Both;10 reps Composite Extension: AAROM;Both;10 reps General Exercises - Lower Extremity Hip Flexion/Marching: AAROM;PROM;Both;10 reps;Supine Other Exercises Other Exercises: PNF diagonals bil UE   Shoulder Instructions       General Comments      Pertinent Vitals/ Pain       Pain Assessment: Faces Faces Pain Scale: No hurt Pain Intervention(s): Monitored during session  Home Living                                           Prior Functioning/Environment              Frequency  Min 2X/week        Progress Toward Goals  OT Goals(current goals can now be found in the care plan section)  Progress towards OT goals: Progressing toward goals  Acute Rehab OT Goals Patient Stated Goal: unable to state OT Goal Formulation: Patient unable to participate in goal setting Time For Goal Achievement: 07/08/18 Potential to Achieve Goals: Good  Plan Discharge plan remains appropriate    Co-evaluation                 AM-PAC OT "6 Clicks" Daily Activity     Outcome Measure   Help from another person eating meals?: A Lot Help from another person taking care of personal grooming?: A Lot Help from another person toileting, which includes using toliet, bedpan, or urinal?: A Lot Help from another person bathing (including washing, rinsing, drying)?: A Lot Help from another person to put on and taking off regular upper body clothing?: A Lot Help from another person to put on and taking off regular lower body clothing?: Total 6 Click Score: 11    End of Session    OT Visit Diagnosis: Unsteadiness on feet (R26.81);Other abnormalities of gait and mobility (R26.89);Muscle weakness (generalized) (M62.81);Other symptoms and signs involving cognitive function   Activity Tolerance Patient tolerated treatment well   Patient Left in bed;with call bell/phone within reach(chair alarm in bed as bed alarm)   Nurse Communication Mobility status        Time: 1425-1505 OT Time Calculation (min): 40 min  Charges: OT General Charges $OT Visit: 1 Visit OT Treatments $Self Care/Home Management : 23-37 mins $Therapeutic Activity: 8-22 mins  Lou Cal, OT [facility] Pager [phone] Office [phone]    Raymondo Band 06/28/2018, 5:01 PM

## 2018-06-28 NOTE — Progress Notes (Signed)
PROGRESS NOTE                                                                                                                                                                                                             Patient Demographics:    Marvin [place], is a 85 y.o. male, DOB - 03/10/1930, OIN:[id number]  Outpatient Primary MD for the patient is Burchette, Alinda Sierras, MD    LOS - 9  Chief Complaint  Patient presents with  . Shortness of Breath       Brief Narrative: Patient is a 85 y.o. male with PMHx of dementia, AFib, HTN, and CAD who presented with confusion and fever found to have covid-19.   Subjective:    Marvin [place] remains pleasantly confused-no major issues overnight.  Remains on room air   Assessment  & Plan :   COVID-19 viral pneumonia: Improved-patient is s/p Actemra and steroids.  Inflammatory markers have trended down, patient is on room air.  Awaiting repeat COVID-19 test before appropriate disposition can be made.  COVID-19 Labs:  No results for input(s): DDIMER, FERRITIN, LDH, CRP in the last 72 hours.  Lab Results  Component Value Date   SARSCOV2NAA POSITIVE (A) 06/19/2018   [place] NEGATIVE 06/18/2018     COVID-19 Medications: 5/20>> Actemra 5/19>> 5/22>> Solu-Medrol  Transaminitis: Probably secondary to COVID-19 or from Actemra-continuous to slowly downtrend.  Follow periodically.   AKI: Resolved-likely hemodynamically mediated  Hypernatremia: Improved-monitor periodically.  Acute metabolic encephalopathy: Secondary to COVID-19 infection-improved with supportive care.  Has dementia at baseline-suspect back to usual baseline.  Chronic atrial fibrillation: Rate controlled with metoprolol.  Not a candidate for anticoagulation.  Remains on aspirin  CAD: No anginal symptoms-continue aspirin/metoprolol and statin  Dementia with delirium: Pleasantly confused-suspect not far from  baseline-continue Aricept/Namenda and Risperdal.  Has not required sedating medications-no bedside sitter in place.  Dysphagia: Probably related to dementia-evaluated by SLP-continue dysphagia 3 diet    ABG:    Component Value Date/Time   PHART 7.371 12/17/2008 2024   PCO2ART 42.2 12/17/2008 2024   PO2ART 162.0 (H) 12/17/2008 2024   HCO3 24.2 (H) 12/17/2008 2024   TCO2 22 12/17/2008 2028   ACIDBASEDEF 1.0 12/17/2008 2024   O2SAT 99.0 12/17/2008 2024    Condition - stable  Family Communication  : Spoke with patient's POA over the  phone this morning.  Code Status :  DNR  Diet : Dysphagia 3 diet  Disposition Plan  :  Remain inpatient-SNF vs ALF over the next few days-social worker following-await repeat COVID testing  Consults  :  None  Procedures  :  None  DVT Prophylaxis  :  Lovenox   Lab Results  Component Value Date   PLT 214 06/25/2018    Inpatient Medications  Scheduled Meds: . aspirin EC  81 mg Oral Daily  . atorvastatin  40 mg Oral Daily  . donepezil  10 mg Oral QHS  . enoxaparin (LOVENOX) injection  40 mg Subcutaneous Q24H  . mouth rinse  15 mL Mouth Rinse BID  . Melatonin  1 mg Oral QHS  . memantine  10 mg Oral BID  . metoprolol tartrate  12.5 mg Oral Daily   Continuous Infusions: PRN Meds:.acetaminophen **OR** acetaminophen, lip balm, LORazepam, ondansetron **OR** ondansetron (ZOFRAN) IV, risperiDONE  Antibiotics  :    Anti-infectives (From admission, onward)   Start     Dose/Rate Route Frequency Ordered Stop   06/19/18 2000  cefTRIAXone (ROCEPHIN) 1 g in sodium chloride 0.9 % 100 mL IVPB  Status:  Discontinued     1 g 200 mL/hr over 30 Minutes Intravenous Every 24 hours 06/19/18 0014 06/20/18 1811   06/19/18 2000  azithromycin (ZITHROMAX) 500 mg in sodium chloride 0.9 % 250 mL IVPB  Status:  Discontinued     500 mg 250 mL/hr over 60 Minutes Intravenous Every 24 hours 06/19/18 0014 06/20/18 1811   06/18/18 2215  cefTRIAXone (ROCEPHIN) 1 g in  sodium chloride 0.9 % 100 mL IVPB     1 g 200 mL/hr over 30 Minutes Intravenous  Once 06/18/18 2209 06/19/18 0006   06/18/18 2215  azithromycin (ZITHROMAX) 500 mg in sodium chloride 0.9 % 250 mL IVPB     500 mg 250 mL/hr over 60 Minutes Intravenous  Once 06/18/18 2209 06/19/18 0006       Time Spent in minutes  15   Oren Binet M.D on 06/28/2018 at 11:05 AM  To page go to [email or web address] - use universal password  [facility] -  Office  [phone]   See all Orders from today for further details   Admit date - 06/18/2018    9    Objective:   Vitals:   06/27/18 1956 06/28/18 0519 06/28/18 0800 06/28/18 0900  BP: (!) 146/77 (!) 142/86 (!) 156/59   Pulse: 81 82 61   Resp: 17 17 18    Temp: 98.6 F (37 C) (!) 97.4 F (36.3 C)  97.6 F (36.4 C)  TempSrc: Oral Oral  Oral  SpO2: 96% 99% 96%   Weight:      Height:        Wt Readings from Last 3 Encounters:  06/18/18 81 kg  09/21/17 79.4 kg  02/08/17 77.9 kg     Intake/Output Summary (Last 24 hours) at 06/28/2018 1105 Last data filed at 06/28/2018 8250 Gross per 24 hour  Intake 420 ml  Output 2 ml  Net 418 ml     Physical Exam General appearance: Awake-pleasantly confused.  Not in any distress.   Eyes: No scleral icterus HEENT: Atraumatic and Normocephalic Neck: supple Resp: Clear to auscultation anteriorly CVS: S1 S2 regular GI: Bowel sounds present, Non tender and not distended with no gaurding, rigidity or rebound. Extremities: No edema Neurology: Moving all 4 extremities-appears to be nonfocal.   Data Review:    CBC Recent Labs  Lab 06/22/18 0600 06/23/18 0331 06/24/18 0656 06/25/18 0313  WBC 12.9* 12.5* 8.2 8.0  HGB 11.2* 10.7* 12.2* 11.6*  HCT 34.4* 33.0* 35.4* 36.0*  PLT 227 220 205 214  MCV 99.1 100.0 97.3 100.3*  MCH 32.3 32.4 33.5 32.3  MCHC 32.6 32.4 34.5 32.2  RDW 12.5 12.6 12.4 12.4  LYMPHSABS 0.7 0.9 1.2 1.5  MONOABS 0.6 0.7 0.3 0.4  EOSABS 0.0 0.0 0.0 0.0  BASOSABS  0.0 0.0 0.0 0.0    Chemistries  Recent Labs  Lab 06/22/18 0600 06/23/18 0331 06/24/18 0656 06/25/18 0313 06/26/18 0435  NA 148* 146* 142 146*  --   K 3.4* 3.8 3.6 3.8  --   CL 115* 116* 110 109  --   CO2 23 22 24 27   --   GLUCOSE 148* 122* 101* 102*  --   BUN 47* 39* 26* 31*  --   CREATININE 0.96 0.91 0.91 0.97 1.07  CALCIUM 8.0* 7.9* 7.9* 8.4*  --   MG 3.2* 3.0* 2.5*  --   --   AST 67* 95* 96* 75*  --   ALT 64* 88* 112* 105*  --   ALKPHOS 66 69 70 67  --   BILITOT 0.7 0.7 0.6 0.7  --    ------------------------------------------------------------------------------------------------------------------ No results for input(s): CHOL, HDL, LDLCALC, TRIG, CHOLHDL, LDLDIRECT in the last 72 hours.  Lab Results  Component Value Date   HGBA1C  12/15/2008    5.3 (NOTE) The ADA recommends the following therapeutic goal for glycemic control related to Hgb A1c measurement: Goal of therapy: <6.5 Hgb A1c  Reference: American Diabetes Association: Clinical Practice Recommendations 2010, Diabetes Care, 2010, 33: (Suppl  1).   ------------------------------------------------------------------------------------------------------------------ No results for input(s): TSH, T4TOTAL, T3FREE, THYROIDAB in the last 72 hours.  Invalid input(s): FREET3 ------------------------------------------------------------------------------------------------------------------ No results for input(s): VITAMINB12, FOLATE, FERRITIN, TIBC, IRON, RETICCTPCT in the last 72 hours.  Coagulation profile No results for input(s): INR, PROTIME in the last 168 hours.  No results for input(s): DDIMER in the last 72 hours.  Cardiac Enzymes No results for input(s): CKMB, TROPONINI, MYOGLOBIN in the last 168 hours.  Invalid input(s): CK ------------------------------------------------------------------------------------------------------------------    Component Value Date/Time   BNP 175.0 (H) 01/14/2017 1058     Micro Results Recent Results (from the past 240 hour(s))  Urine culture     Status: None   Collection Time: 06/18/18  8:48 PM  Result Value Ref Range Status   Specimen Description   Final    URINE, RANDOM Performed at [facility], [place] [address], [place], [place] [address]    Special Requests   Final    NONE Performed at [facility], [place] [address] [place], [place] [address]    Culture   Final    NO GROWTH Performed at [facility], [place] [address], [place], [place] [address]    Report Status 06/19/2018 FINAL  Final  SARS Coronavirus 2 (CEPHEID- Performed in [place] hospital lab), Hosp Order     Status: None   Collection Time: 06/18/18  8:53 PM  Result Value Ref Range Status   SARS Coronavirus 2 NEGATIVE NEGATIVE Final    Comment: (NOTE) If result is NEGATIVE SARS-CoV-2 target nucleic acids are NOT DETECTED. The SARS-CoV-2 RNA is generally detectable in upper and lower  respiratory specimens during the acute phase of infection. The lowest  concentration of SARS-CoV-2 viral copies this assay can detect is 250  copies / mL. A negative  result does not preclude SARS-CoV-2 infection  and should not be used as the sole basis for treatment or other  patient management decisions.  A negative result may occur with  improper specimen collection / handling, submission of specimen other  than nasopharyngeal swab, presence of viral mutation(s) within the  areas targeted by this assay, and inadequate number of viral copies  (<250 copies / mL). A negative result must be combined with clinical  observations, patient history, and epidemiological information. If result is POSITIVE SARS-CoV-2 target nucleic acids are DETECTED. The SARS-CoV-2 RNA is generally detectable in upper and lower  respiratory specimens dur ing the acute phase of infection.  Positive  results are indicative of active infection with SARS-CoV-2.  Clinical  correlation  with patient history and other diagnostic information is  necessary to determine patient infection status.  Positive results do  not rule out bacterial infection or co-infection with other viruses. If result is PRESUMPTIVE POSTIVE SARS-CoV-2 nucleic acids MAY BE PRESENT.   A presumptive positive result was obtained on the submitted specimen  and confirmed on repeat testing.  While 2019 novel coronavirus  (SARS-CoV-2) nucleic acids may be present in the submitted sample  additional confirmatory testing may be necessary for epidemiological  and / or clinical management purposes  to differentiate between  SARS-CoV-2 and other Sarbecovirus currently known to infect humans.  If clinically indicated additional testing with an alternate test  methodology [id number]) is advised. The SARS-CoV-2 RNA is generally  detectable in upper and lower respiratory sp ecimens during the acute  phase of infection. The expected result is Negative. Fact Sheet for Patients:  StrictlyIdeas.no Fact Sheet for Healthcare Providers: BankingDealers.co.za This test is not yet approved or cleared by the [place] FDA and has been authorized for detection and/or diagnosis of SARS-CoV-2 by FDA under an Emergency Use Authorization (EUA).  This EUA will remain in effect (meaning this test can be used) for the duration of the COVID-19 declaration under Section 564(b)(1) of the Act, 21 U.S.C. section 360bbb-3(b)(1), unless the authorization is terminated or revoked sooner. Performed at [facility], [place] [address] [place], [place] [address]   Blood Culture (routine x 2)     Status: None   Collection Time: 06/18/18  9:01 PM  Result Value Ref Range Status   Specimen Description   Final    BLOOD LEFT ARM Performed at [place] [address], [place], [place] [address]    Special Requests   Final    BOTTLES DRAWN AEROBIC AND  ANAEROBIC Blood Culture adequate volume Performed at [place] [address] [place], [place] [address]    Culture   Final    NO GROWTH 5 DAYS Performed at [facility], [place] [address] [place], [place] [address]    Report Status 06/23/2018 FINAL  Final  Blood Culture (routine x 2)     Status: None   Collection Time: 06/18/18  9:01 PM  Result Value Ref Range Status   Specimen Description   Final    BLOOD RIGHT ARM Performed at [place] [address] [place], [place] [address]    Special Requests   Final    BOTTLES DRAWN AEROBIC AND ANAEROBIC Blood Culture adequate volume Performed at [place] [address] [place], [place] [address]    Culture   Final    NO GROWTH 5 DAYS Performed at [facility], [place]  [address], [place], [place] [address]    Report Status 06/23/2018 FINAL  Final  MRSA PCR Screening     Status: None   Collection Time: 06/19/18  2:53 AM  Result Value Ref Range Status   MRSA by PCR NEGATIVE NEGATIVE Final    Comment:        The GeneXpert MRSA Assay (FDA approved for NASAL specimens only), is one component of a comprehensive MRSA colonization surveillance program. It is not intended to diagnose MRSA infection nor to guide or monitor treatment for MRSA infections. Performed at [facility], [place] [address] [place], [place] [address]   SARS Coronavirus 2 (CEPHEID- Performed in [place] hospital lab), Hosp Order     Status: Abnormal   Collection Time: 06/19/18  6:15 AM  Result Value Ref Range Status   SARS Coronavirus 2 POSITIVE (A) NEGATIVE Final    Comment: RESULT CALLED TO, READ BACK BY AND VERIFIED WITH: NELSON,A. RN @0809  ON 5.19.2020 BY NMCCOY (NOTE) If result is NEGATIVE SARS-CoV-2 target nucleic acids are NOT DETECTED. The SARS-CoV-2 RNA is generally detectable in upper and lower  respiratory specimens during the acute phase of infection.  The lowest  concentration of SARS-CoV-2 viral copies this assay can detect is 250  copies / mL. A negative result does not preclude SARS-CoV-2 infection  and should not be used as the sole basis for treatment or other  patient management decisions.  A negative result may occur with  improper specimen collection / handling, submission of specimen other  than nasopharyngeal swab, presence of viral mutation(s) within the  areas targeted by this assay, and inadequate number of viral copies  (<250 copies / mL). A negative result must be combined with clinical  observations, patient history, and epidemiological information. If result is POSITIVE SARS-CoV-2 target nucleic acids are DETECT ED. The SARS-CoV-2 RNA is generally detectable in upper and lower  respiratory specimens during the acute phase of infection.  Positive  results are indicative of active infection with SARS-CoV-2.  Clinical  correlation with patient history and other diagnostic information is  necessary to determine patient infection status.  Positive results do  not rule out bacterial infection or co-infection with other viruses. If result is PRESUMPTIVE POSTIVE SARS-CoV-2 nucleic acids MAY BE PRESENT.   A presumptive positive result was obtained on the submitted specimen  and confirmed on repeat testing.  While 2019 novel coronavirus  (SARS-CoV-2) nucleic acids may be present in the submitted sample  additional confirmatory testing may be necessary for epidemiological  and / or clinical management purposes  to differentiate between  SARS-CoV-2 and other Sarbecovirus currently known to infect humans.  If clinically indicated additional testing with an alternate test  methodology ([id number]) is advised. The SARS-CoV-2 RNA is generally  detectable in upper and lower respiratory specimens during the acute  phase of infection. The expected result is Negative. Fact Sheet for Patients:  StrictlyIdeas.no  Fact Sheet for Healthcare Providers: BankingDealers.co.za This test is not yet approved or cleared by the [place] FDA and has been authorized for detection and/or diagnosis of SARS-CoV-2 by FDA under an Emergency Use Authorization (EUA).  This EUA will remain in effect (meaning this test can be used) for the duration of the COVID-19 declaration under Section 564(b)(1) of the Act, 21 U.S.C. section 360bbb-3(b)(1), unless the authorization is terminated or revoked sooner. Performed at [facility], [place] [address], [place], [place] [address]   Respiratory Panel by PCR  Status: None   Collection Time: 06/21/18  3:15 PM  Result Value Ref Range Status   Adenovirus NOT DETECTED NOT DETECTED Final   Coronavirus 229E NOT DETECTED NOT DETECTED Final    Comment: (NOTE) The Coronavirus on the Respiratory Panel, DOES NOT test for the novel  Coronavirus (2019 nCoV)    Coronavirus HKU1 NOT DETECTED NOT DETECTED Final   Coronavirus NL63 NOT DETECTED NOT DETECTED Final   Coronavirus OC43 NOT DETECTED NOT DETECTED Final   Metapneumovirus NOT DETECTED NOT DETECTED Final   Rhinovirus / Enterovirus NOT DETECTED NOT DETECTED Final   Influenza A NOT DETECTED NOT DETECTED Final   Influenza B NOT DETECTED NOT DETECTED Final   Parainfluenza Virus 1 NOT DETECTED NOT DETECTED Final   Parainfluenza Virus 2 NOT DETECTED NOT DETECTED Final   Parainfluenza Virus 3 NOT DETECTED NOT DETECTED Final   Parainfluenza Virus 4 NOT DETECTED NOT DETECTED Final   Respiratory Syncytial Virus NOT DETECTED NOT DETECTED Final   Bordetella pertussis NOT DETECTED NOT DETECTED Final   Chlamydophila pneumoniae NOT DETECTED NOT DETECTED Final   Mycoplasma pneumoniae NOT DETECTED NOT DETECTED Final    Comment: Performed at [facility], [place]. [address], [place], [place] [address]    Radiology Reports Ct Head Wo Contrast  Result Date: 06/19/2018 CLINICAL DATA:  85 year old  male with shortness of breath weakness, altered mental status. Fever of 101.4 F. EXAM: CT HEAD WITHOUT CONTRAST TECHNIQUE: Contiguous axial images were obtained from the base of the skull through the vertex without intravenous contrast. COMPARISON:  Head CT 01/14/2017, MRI 12/26/2016 and earlier. FINDINGS: Brain: Stable cerebral volume. Stable ventricle size and configuration. No midline shift, mass effect, or evidence of intracranial mass lesion. No acute intracranial hemorrhage identified. Stable gray-white matter differentiation throughout the brain. No cortically based acute infarct identified. Vascular: Calcified atherosclerosis at the skull base. No suspicious intracranial vascular hyperdensity. Skull: No acute osseous abnormality identified. Sinuses/Orbits: Visualized paranasal sinuses and mastoids are stable and well pneumatized. Other: Stable orbit and scalp soft tissues. IMPRESSION: Stable non contrast CT appearance of the brain since 2018. No acute intracranial abnormality. Electronically Signed   By: Genevie Ann M.D.   On: 06/19/2018 00:24   Dg Chest Portable 1 View  Result Date: 06/18/2018 CLINICAL DATA:  85 year old male with recent pneumonia. Fever and shortness of breath. COVID-19 status is pending. EXAM: PORTABLE CHEST 1 VIEW COMPARISON:  01/14/2017 and earlier. FINDINGS: Portable AP semi upright view at 2042 hours. Ill-defined abnormal right upper lobe opacity near the hilum is new since 2018. there is also patchy confluent opacity at the right lateral costophrenic angle. There is mild superimposed asymmetric indistinct and interstitial opacity in both lungs. Chronic prosthetic cardiac valve. Stable cardiac size and mediastinal contours. Calcified aortic atherosclerosis. Visualized tracheal air column is within normal limits. No pneumothorax or pleural effusion. Negative visible bowel gas pattern. No acute osseous abnormality identified. IMPRESSION: Abnormal right greater than left pulmonary  opacity most confluent in the right upper lobe opacity. Consider acute pneumonia versus viral/atypical respiratory infection. No pleural effusion. Electronically Signed   By: Genevie Ann M.D.   On: 06/18/2018 21:43

## 2018-06-28 NOTE — Progress Notes (Signed)
Daughter Katharine Look called to update. No answer left voicemail.

## 2018-06-28 NOTE — Progress Notes (Signed)
The pt is pleasantly confused and has not required a Air cabin crew. The pt has not tried to get out of bed by himself or has he displayed any aggressive behaviors. I will continue to monitor the pt.

## 2018-06-28 NOTE — Progress Notes (Signed)
The pt's legal gaurdian was called and given a daily update regarding the pt's current status.

## 2018-06-29 LAB — GLUCOSE, CAPILLARY
Glucose-Capillary: 89 mg/dL (ref 70–99)
Glucose-Capillary: 132 mg/dL — ABNORMAL HIGH (ref 70–99)
Glucose-Capillary: 97 mg/dL (ref 70–99)
Glucose-Capillary: 117 mg/dL — ABNORMAL HIGH (ref 70–99)

## 2018-06-29 NOTE — [place] FL2 (Signed)
[place] MEDICAID FL2 LEVEL OF CARE SCREENING TOOL     IDENTIFICATION  Patient Name: Marvin Wagner Birthdate: Aug 02, 1930 Sex: male Admission Date (Current Location): 06/18/2018  [facility] and [place] Number:  Herbalist and Address:  [facility], [place] [address] [place], [place])      Provider Number: [id number]  Attending Physician Name and Address:  Jonetta Osgood, MD  Relative Name and Phone Number:  Fielding Mault; [phone]; daughter    Current Level of Care: Hospital Recommended Level of Care: [place] Prior Approval Number:    Date Approved/Denied:   PASRR Number: [id number]  Discharge Plan: Other (Comment)(ALF)    Current Diagnoses: Patient Active Problem List   Diagnosis Date Noted  . Community acquired pneumonia 06/19/2018  . Acute encephalopathy 06/19/2018  . CAP (community acquired pneumonia) 06/19/2018  . Acute blood loss anemia 02/27/2016  . UTI (urinary tract infection) 02/27/2016  . Pressure injury of skin 02/25/2016  . Hematoma of chest wall, left, initial encounter 02/24/2016  . Fever 02/24/2016  . Supratherapeutic INR 02/24/2016  . Dementia ([place]) 03/04/2015  . Hypotension 12/03/2014  . Weight loss, unintentional 10/17/2013  . History of elevated PSA 04/25/2013  . Encounter for therapeutic drug monitoring 02/26/2013  . Long term current use of anticoagulant therapy 12/19/2012  . Other vitamin B12 deficiency anemia 02/29/2012  . Memory disorder 02/14/2012  . Hx of CABG 07/01/2010  . Hyperlipidemia 07/01/2010  . Benign hypertensive heart disease without heart failure 07/01/2010    Orientation RESPIRATION BLADDER Height & Weight     (disoriented x 4)  Normal Incontinent Weight: 178 lb 9.2 oz (81 kg) Height:  6' (182.9 cm)  BEHAVIORAL SYMPTOMS/MOOD NEUROLOGICAL BOWEL NUTRITION STATUS      Incontinent Low Sodium, Heart Healthy Diet  AMBULATORY STATUS  COMMUNICATION OF NEEDS Skin   Extensive Assist Verbally Normal                       Personal Care Assistance Level of Assistance  Bathing, Feeding, Dressing Bathing Assistance: Maximum assistance Feeding assistance: Maximum assistance Dressing Assistance: Maximum assistance     Functional Limitations Info  Sight, Hearing, Speech Sight Info: Adequate Hearing Info: Adequate Speech Info: Impaired(dysarthria)    SPECIAL CARE FACTORS FREQUENCY                      Contractures Contractures Info: Not present    Additional Factors Info  Code Status, Allergies, Psychotropic Code Status Info: DNR Allergies Info: Penicillins Psychotropic Info: Aricept 10mg  daily         Discharge Medications: STOP taking these medications   levofloxacin 750 MG tablet Commonly known as:  LEVAQUIN   warfarin 5 MG tablet Commonly known as:  COUMADIN     TAKE these medications   acetaminophen 500 MG tablet Commonly known as:  TYLENOL Take 500 mg by mouth every 6 (six) hours as needed for mild pain.   aspirin EC 81 MG tablet Take 81 mg by mouth daily.   atorvastatin 40 MG tablet Commonly known as:  LIPITOR Take 1 tablet (40 mg total) daily by mouth.   Centrum Silver 50+Men Tabs Take 1 tablet by mouth daily.   [employer] Tussin DM 10-100 MG/5ML liquid Generic drug:  dextromethorphan-guaiFENesin Take 10 mLs by mouth every 6 (six) hours as needed for cough or congestion.   donepezil 10 MG tablet Commonly known as:  ARICEPT TAKE  1 TABLET (10 MG TOTAL) BY MOUTH AT BEDTIME.   Melatonin 3 MG Tabs Take 3 mg by mouth at bedtime.   Melatonin 1 MG Caps Take 1 capsule (1 mg total) by mouth at bedtime.   memantine 10 MG tablet Commonly known as:  NAMENDA Take 1 tablet (10 mg total) by mouth 2 (two) times daily. TAKE   1 TABLET (10MG ) BY MOUTH TWICE DAILY What changed:  additional instructions   metoprolol tartrate 25 MG tablet Commonly known as:  LOPRESSOR TAKE  ONE-HALF TABLETS (12.5 MG TOTAL) BY MOUTH DAILY. What changed:  See the new instructions.   ondansetron 8 MG disintegrating tablet Commonly known as:  Zofran ODT Take 1 tablet (8 mg total) by mouth every 8 (eight) hours as needed for nausea or vomiting.   risperiDONE 0.25 MG tablet Commonly known as:  RisperDAL Take 1 tablet (0.25 mg total) by mouth 2 (two) times daily. What changed:    when to take this  reasons to take this  additional instructions        Relevant Imaging Results:  Relevant Lab Results: Collection Time: 06/27/18  7:38 AM   Result Value Ref Range Status   SARS-CoV-2, NAA NOT DETECTED NOT DETECTED Final    Comment: (NOTE) This test was developed and its performance characteristics determined by [employer]. This test has not been FDA cleared or approved. This test has been authorized by FDA under an Emergency Use Authorization (EUA). This test is only authorized for the duration of time the declaration that circumstances exist justifying the authorization of the emergency use of in vitro diagnostic tests for detection of SARS-CoV-2 virus and/or diagnosis of COVID-19 infection under section 564(b)(1) of the Act, 21 U.S.C. 267TIW-5(Y)(0), unless the authorization is terminated or revoked sooner. When diagnostic testing is negative, the possibility of a false negative result should be considered in the context of a patient's recent exposures and the presence of clinical signs and symptoms consistent with COVID-19. An individual without symptoms of COVID-19 and who is not shedding SARS-CoV-2 virus would expect to have a negative (not detected) result in this assay. Performed  At: [facility] [place], [place] [id number] Rush Farmer MD NL:[id number]    [place]  Final    Comment: Performed at [facility] [address] [place], [place] [address]      Additional Information SS#: [id number]  Alberteen Sam, LCSW

## 2018-06-29 NOTE — Progress Notes (Signed)
Called Ms. Johns, patient's guardian at [phone]. No one available to answer call. Left message for her to contact me regarding updates

## 2018-06-29 NOTE — Progress Notes (Signed)
Physical Therapy Treatment Patient Details Name: Marvin Wagner MRN: [id number] DOB: February 13, 1930 Today's Date: 06/29/2018    History of Present Illness 85 y.o. male admitted 06/19/18 due to fever and confusion.  Pt dx with COVID after second test (initial test was negative) came back positive. Pt also with LFT elevation, AKI, hypernatremia, hypokalemia, acute metabolic encephalopathy.  Pt with other significant PMH of HTN, CAD, aortic stenosis, dementia, and aortic valve replacement.      PT Comments    Pt was able to get OOB to the recliner chair with two person heavy mod assist from elevated bed with RW.  He stood for ~3 mins "dancing" or weight shifting side to side listening to music and holding the RW.  Upright posture encouraged.  He was a bit slower to initiate movement with multimodal cues today, but did get up, just the same.  PT will continue to follow acutely for safe mobility progression  Follow Up Recommendations  SNF     Equipment Recommendations  Wheelchair (measurements PT);Wheelchair cushion (measurements PT);Hospital bed    Recommendations for Other Services   NA     Precautions / Restrictions Precautions Precautions: Fall Restrictions Weight Bearing Restrictions: No    Mobility  Bed Mobility Overal bed mobility: Needs Assistance Bed Mobility: Supine to Sit Rolling: +2 for physical assistance;Mod assist         General bed mobility comments: Two person mod assist to come to sitting from elevated HOB.  More time needed to coax pt to the edge of the bed today with multimodal cues.   Transfers Overall transfer level: Needs assistance Equipment used: Rolling walker (2 wheeled) Transfers: Sit to/from [facility] Sit to Stand: +2 physical assistance;Mod assist;From elevated surface Stand pivot transfers: +2 physical assistance;Mod assist;From elevated surface       General transfer comment: Two person heavy mod assist to stand from elevated  bed to RW, difficult to initiate movement to the walker, but once up, lighter mod assist to take some short, shuffling steps around to the chair.          Balance Overall balance assessment: Needs assistance Sitting-balance support: Bilateral upper extremity supported;Feet unsupported Sitting balance-Leahy Scale: Poor Sitting balance - Comments: Needs mod assist at trunk EOB today, but likely because I could not get his feet to the floor or get him to initiate scooting to the edge of the bed Postural control: Posterior lean Standing balance support: Bilateral upper extremity supported Standing balance-Leahy Scale: Poor Standing balance comment: needs support from RW and two therapists.                             Cognition Arousal/Alertness: Awake/alert Behavior During Therapy: WFL for tasks assessed/performed Overall Cognitive Status: History of cognitive impairments - at baseline                                 General Comments: baeline dementia, but pt a bit more confused today. He was a bit harder to get to initiate movement and would not release a hard grasp on my hands a few times.       Exercises General Exercises - Upper Extremity Shoulder Flexion: AAROM;Both;10 reps Elbow Flexion: AAROM;Both;10 reps Elbow Extension: AAROM;Both;10 reps        Pertinent Vitals/Pain Pain Assessment: Faces Faces Pain Scale: Hurts little more Pain Location: generalized with handling of  legs Pain Descriptors / Indicators: Grimacing;Guarding Pain Intervention(s): Limited activity within patient's tolerance;Monitored during session;Repositioned           PT Goals (current goals can now be found in the care plan section) Acute Rehab PT Goals Patient Stated Goal: unable to state Progress towards PT goals: Progressing toward goals    Frequency    Min 3X/week      PT Plan Current plan remains appropriate       AM-PAC PT "6 Clicks" Mobility   Outcome  Measure  Help needed turning from your back to your side while in a flat bed without using bedrails?: A Lot Help needed moving from lying on your back to sitting on the side of a flat bed without using bedrails?: A Lot Help needed moving to and from a bed to a chair (including a wheelchair)?: A Lot Help needed standing up from a chair using your arms (e.g., wheelchair or bedside chair)?: A Lot Help needed to walk in hospital room?: Total Help needed climbing 3-5 steps with a railing? : Total 6 Click Score: 10    End of Session Equipment Utilized During Treatment: Gait belt Activity Tolerance: Patient tolerated treatment well Patient left: in chair;with call bell/phone within reach;with chair alarm set   PT Visit Diagnosis: Muscle weakness (generalized) (M62.81);Difficulty in walking, not elsewhere classified (R26.2)     Time: 3818-4037 PT Time Calculation (min) (ACUTE ONLY): 36 min  Charges:  $Therapeutic Activity: 23-37 mins                    Emory Leaver B. Elissia Spiewak, PT, DPT  Acute Rehabilitation [phone] pager #[phone] office   06/29/2018, 5:28 PM

## 2018-06-29 NOTE — Progress Notes (Signed)
[facility] reports that after reading chart information that was requested for CSW to fax to them, they believe they are not able to meet patient's needs as he is a 2 person assist and they do not have the staff for that. They also report they are not equipped to manage a dysphagia 3 diet that patient currently requires. They are suggesting patient go to SNF to get back to baseline before returning to [facility].   CSW requesting 2nd covid test as SNF will require 2 negatives. CSW will work on SNF placement.   [place], [place]

## 2018-06-29 NOTE — Progress Notes (Signed)
Physical Therapy Treatment Note  Clinical impression: Patient able to stand pivot from recliner to bed with 2 person assist after sitting up x 1 hour. Patient required increased assist to stand from recliner compared to EOB. He was more alert and once standing did a better job of moving his feet as pivoting with RW to bed. When approached with a calm demeanor, he has consistently been cooperative during therapy.    06/29/18 1729  PT Visit Information  Last PT Received On 06/29/18  Assistance Needed +2  History of Present Illness 85 y.o. male admitted 06/19/18 due to fever and confusion.  Pt dx with COVID after second test (initial test was negative) came back positive. Pt also with LFT elevation, AKI, hypernatremia, hypokalemia, acute metabolic encephalopathy.  Pt with other significant PMH of HTN, CAD, aortic stenosis, dementia, and aortic valve replacement.    Subjective Data  Patient Stated Goal unable to state  Precautions  Precautions Fall  Pain Assessment  Pain Assessment Faces  Faces Pain Scale 0  Cognition  Arousal/Alertness Awake/alert  Behavior During Therapy WFL for tasks assessed/performed  Overall Cognitive Status History of cognitive impairments - at baseline  General Comments improved ability to follow commands and gestures compared to earlier session  Bed Mobility  Overal bed mobility Needs Assistance  Bed Mobility Sit to Supine  Sit to supine Total assist;+2 for physical assistance  General bed mobility comments pt fatigued after up in chair and ?understood what he was doing as assisted him to supine. He then rolled to his left side without assist; assisted back onto his back to raise up in the bed and he again rolled onto his left side without assist  Transfers  Overall transfer level Needs assistance  Equipment used Rolling walker (2 wheeled)  Transfers Sit to/from [employer] Transfers  Sit to Stand +2 physical assistance;Mod assist (from recliner)  Stand  pivot transfers +2 physical assistance;Mod assist (max tactile cues/faciilitation to turn )  General transfer comment Pt with poor use of UEs to assist with come to stand from recliner. Pushing backs of legs against the recliner with posterior lean as coming to stand.   Ambulation/Gait  General Gait Details pivotal steps with RW  Balance  Overall balance assessment Needs assistance  Postural control Posterior lean  Standing balance support Bilateral upper extremity supported  Standing balance-Leahy Scale Poor  Standing balance comment needs support from RW and two person assist  PT - End of Session  Equipment Utilized During Treatment Gait belt  Activity Tolerance Patient tolerated treatment well  Patient left with call bell/phone within reach;in bed;with nursing/sitter in room (RN aware that bed alarm is not working)  Nurse Communication Mobility status   PT - Assessment/Plan  PT Plan Current plan remains appropriate  PT Visit Diagnosis Muscle weakness (generalized) (M62.81);Difficulty in walking, not elsewhere classified (R26.2)  PT Frequency (ACUTE ONLY) Min 3X/week  Follow Up Recommendations SNF  PT equipment Wheelchair (measurements PT);Wheelchair cushion (measurements PT);Hospital bed  AM-PAC PT "6 Clicks" Mobility Outcome Measure (Version 2)  Help needed turning from your back to your side while in a flat bed without using bedrails? 3  Help needed moving from lying on your back to sitting on the side of a flat bed without using bedrails? 2  Help needed moving to and from a bed to a chair (including a wheelchair)? 2  Help needed standing up from a chair using your arms (e.g., wheelchair or bedside chair)? 2  Help needed to walk  in hospital room? 1  Help needed climbing 3-5 steps with a railing?  1  6 Click Score 11  Consider Recommendation of Discharge To: CIR/SNF/LTACH  PT Goal Progression  Progress towards PT goals Progressing toward goals  Acute Rehab PT Goals  Time For  Goal Achievement 07/07/18  PT Time Calculation  PT Start Time (ACUTE ONLY) 1635  PT Stop Time (ACUTE ONLY) 1651  PT Time Calculation (min) (ACUTE ONLY) 16 min  PT General Charges  $$ ACUTE PT VISIT 1 Visit  PT Treatments  $Therapeutic Activity 8-22 mins    Barry Brunner, PT

## 2018-06-29 NOTE — Discharge Summary (Signed)
PATIENT DETAILS Name: Marvin Wagner Age: 85 y.o. Sex: male Date of Birth: 1930/11/23 MRN: [id number]. Admitting Physician: Rise Patience, MD [id number], Alinda Sierras, MD  Admit Date: 06/18/2018 Discharge date: 06/29/2018  Recommendations for Outpatient Follow-up:  1. Follow up with PCP in 1-2 weeks 2. Please obtain BMP/CBC in one week  Admitted From:  ALF   Disposition: ALF    Home Health: No  Equipment/Devices: None  Discharge Condition: Stable  CODE STATUS: DNR  Diet recommendation:  Dysphagia 3 with full aspiration precautions  Brief Summary: See H&P, Labs, Consult and Test reports for all details in brief, Patient is a 85 y.o. male with PMHx of dementia, AFib, HTN, and CAD who presented with confusion and fever found to have covid-19.   Brief Hospital Course: COVID-19 viral pneumonia: Improved-patient is s/p Actemra and steroids.  Inflammatory markers have trended down, patient is on room air.  Repeat COVID PCR negative.  Transaminitis: Probably secondary to COVID-19 or from Actemra-continuous to slowly downtrend.  Follow periodically.   AKI: Resolved-likely hemodynamically mediated  Hypernatremia: Improved-monitor periodically.  Acute metabolic encephalopathy: Secondary to COVID-19 infection-improved with supportive care.  Has dementia at baseline-suspect back to usual baseline.  Chronic atrial fibrillation: Rate controlled with metoprolol.  Not a candidate for anticoagulation given frailty/fall risk.   Per patient's POA-patient apparently was not on warfarin prior to this hospitalization-this has already been discontinued.  We discussed risks of CVA/benefits-POA does not want patient back on anticoagulation.  Okay to continue aspirin on discharge.  CAD: No anginal symptoms-continue aspirin/metoprolol and statin  Dementia with delirium: Pleasantly confused-suspect not far from baseline-continue Aricept/Namenda and Risperdal.    Dysphagia:  Probably related to dementia-evaluated by SLP-continue dysphagia 3 diet  Procedures/Studies: None  Discharge Diagnoses:  Principal Problem:   Community acquired pneumonia Active Problems:   Hx of CABG   Dementia ([place])   Acute encephalopathy   CAP (community acquired pneumonia)   Discharge Instructions:  Activity:  As tolerated with Full fall precautions use walker/cane & assistance as needed  Discharge Instructions    Diet - low sodium heart healthy   Complete by:  As directed    Diet recommendations: Thin liquid;Dysphagia 3 (mechanical soft) Liquids provided via: Cup;Straw Medication Administration: Crushed with puree Supervision: Staff to assist with self feeding;Full supervision/cueing for compensatory strategies Compensations: Slow rate;Small sips/bites;Minimize environmental distractions Postural Changes and/or Swallow Maneuvers: Seated upright 90 degrees   Discharge instructions   Complete by:  As directed    Follow with Primary MD  Eulas Post, MD in 1 week  Please get a complete blood count and chemistry panel checked by your Primary MD at your next visit, and again as instructed by your Primary MD.  Get Medicines reviewed and adjusted: Please take all your medications with you for your next visit with your Primary MD  Laboratory/radiological data: Please request your Primary MD to go over all hospital tests and procedure/radiological results at the follow up, please ask your Primary MD to get all Hospital records sent to his/her office.  In some cases, they will be blood work, cultures and biopsy results pending at the time of your discharge. Please request that your primary care M.D. follows up on these results.  Also Note the following: If you experience worsening of your admission symptoms, develop shortness of breath, life threatening emergency, suicidal or homicidal thoughts you must seek medical attention immediately by calling 911 or calling your MD  immediately  if symptoms less severe.  You must read complete instructions/literature along with all the possible adverse reactions/side effects for all the Medicines you take and that have been prescribed to you. Take any new Medicines after you have completely understood and accpet all the possible adverse reactions/side effects.   Do not drive when taking Pain medications or sleeping medications (Benzodaizepines)  Do not take more than prescribed Pain, Sleep and Anxiety Medications. It is not advisable to combine anxiety,sleep and pain medications without talking with your primary care practitioner  Special Instructions: If you have smoked or chewed Tobacco  in the last 2 yrs please stop smoking, stop any regular Alcohol  and or any Recreational drug use.  Wear Seat belts while driving.  Please note: You were cared for by a hospitalist during your hospital stay. Once you are discharged, your primary care physician will handle any further medical issues. Please note that NO REFILLS for any discharge medications will be authorized once you are discharged, as it is imperative that you return to your primary care physician (or establish a relationship with a primary care physician if you do not have one) for your post hospital discharge needs so that they can reassess your need for medications and monitor your lab values.   Increase activity slowly   Complete by:  As directed      Allergies as of 06/29/2018      Reactions   Penicillins Other (See Comments)   Has patient had a PCN reaction causing immediate rash, facial/tongue/throat swelling, SOB or lightheadedness with hypotension: Unknown Has patient had a PCN reaction causing severe rash involving mucus membranes or skin necrosis: Unknown Has patient had a PCN reaction that required hospitalization: Unknown Has patient had a PCN reaction occurring within the last 10 years: Unknown If all of the above answers are "NO", then may proceed with  Cephalosporin use.      Medication List    STOP taking these medications   levofloxacin 750 MG tablet Commonly known as:  LEVAQUIN   warfarin 5 MG tablet Commonly known as:  COUMADIN     TAKE these medications   acetaminophen 500 MG tablet Commonly known as:  TYLENOL Take 500 mg by mouth every 6 (six) hours as needed for mild pain.   aspirin EC 81 MG tablet Take 81 mg by mouth daily.   atorvastatin 40 MG tablet Commonly known as:  LIPITOR Take 1 tablet (40 mg total) daily by mouth.   Centrum Silver 50+Men Tabs Take 1 tablet by mouth daily.   [facility] Tussin DM 10-100 MG/5ML liquid Generic drug:  dextromethorphan-guaiFENesin Take 10 mLs by mouth every 6 (six) hours as needed for cough or congestion.   donepezil 10 MG tablet Commonly known as:  ARICEPT TAKE 1 TABLET (10 MG TOTAL) BY MOUTH AT BEDTIME.   Melatonin 3 MG Tabs Take 3 mg by mouth at bedtime.   Melatonin 1 MG Caps Take 1 capsule (1 mg total) by mouth at bedtime.   memantine 10 MG tablet Commonly known as:  NAMENDA Take 1 tablet (10 mg total) by mouth 2 (two) times daily. TAKE   1 TABLET (10MG ) BY MOUTH TWICE DAILY What changed:  additional instructions   metoprolol tartrate 25 MG tablet Commonly known as:  LOPRESSOR TAKE ONE-HALF TABLETS (12.5 MG TOTAL) BY MOUTH DAILY. What changed:  See the new instructions.   ondansetron 8 MG disintegrating tablet Commonly known as:  Zofran ODT Take 1 tablet (8 mg total) by mouth every 8 (eight) hours as  needed for nausea or vomiting.   risperiDONE 0.25 MG tablet Commonly known as:  RisperDAL Take 1 tablet (0.25 mg total) by mouth 2 (two) times daily. What changed:    when to take this  reasons to take this  additional instructions      Follow-up Information    Burchette, Alinda Sierras, MD. Schedule an appointment as soon as possible for a visit in 1 week(s).   Specialty:  Family Medicine Contact information: [place]  [id number] [phone]          Allergies  Allergen Reactions   Penicillins Other (See Comments)    Has patient had a PCN reaction causing immediate rash, facial/tongue/throat swelling, SOB or lightheadedness with hypotension: Unknown Has patient had a PCN reaction causing severe rash involving mucus membranes or skin necrosis: Unknown Has patient had a PCN reaction that required hospitalization: Unknown Has patient had a PCN reaction occurring within the last 10 years: Unknown If all of the above answers are "NO", then may proceed with Cephalosporin use.     Consultations:   None   Other Procedures/Studies: Ct Head Wo Contrast  Result Date: 06/19/2018 CLINICAL DATA:  85 year old male with shortness of breath weakness, altered mental status. Fever of 101.4 F. EXAM: CT HEAD WITHOUT CONTRAST TECHNIQUE: Contiguous axial images were obtained from the base of the skull through the vertex without intravenous contrast. COMPARISON:  Head CT 01/14/2017, MRI 12/26/2016 and earlier. FINDINGS: Brain: Stable cerebral volume. Stable ventricle size and configuration. No midline shift, mass effect, or evidence of intracranial mass lesion. No acute intracranial hemorrhage identified. Stable gray-white matter differentiation throughout the brain. No cortically based acute infarct identified. Vascular: Calcified atherosclerosis at the skull base. No suspicious intracranial vascular hyperdensity. Skull: No acute osseous abnormality identified. Sinuses/Orbits: Visualized paranasal sinuses and mastoids are stable and well pneumatized. Other: Stable orbit and scalp soft tissues. IMPRESSION: Stable non contrast CT appearance of the brain since 2018. No acute intracranial abnormality. Electronically Signed   By: Genevie Ann M.D.   On: 06/19/2018 00:24   Dg Chest Portable 1 View  Result Date: 06/18/2018 CLINICAL DATA:  86 year old male with recent pneumonia. Fever and shortness of breath. COVID-19 status is pending.  EXAM: PORTABLE CHEST 1 VIEW COMPARISON:  01/14/2017 and earlier. FINDINGS: Portable AP semi upright view at 2042 hours. Ill-defined abnormal right upper lobe opacity near the hilum is new since 2018. there is also patchy confluent opacity at the right lateral costophrenic angle. There is mild superimposed asymmetric indistinct and interstitial opacity in both lungs. Chronic prosthetic cardiac valve. Stable cardiac size and mediastinal contours. Calcified aortic atherosclerosis. Visualized tracheal air column is within normal limits. No pneumothorax or pleural effusion. Negative visible bowel gas pattern. No acute osseous abnormality identified. IMPRESSION: Abnormal right greater than left pulmonary opacity most confluent in the right upper lobe opacity. Consider acute pneumonia versus viral/atypical respiratory infection. No pleural effusion. Electronically Signed   By: Genevie Ann M.D.   On: 06/18/2018 21:43      TODAY-DAY OF DISCHARGE:  Subjective:   Danise Mina today remains pleasantly confused.  Objective:   Blood pressure (!) 138/58, pulse 81, temperature 97.9 F (36.6 C), temperature source Axillary, resp. rate 17, height 6' (1.829 m), weight 81 kg, SpO2 99 %.  Intake/Output Summary (Last 24 hours) at 06/29/2018 1025 Last data filed at 06/28/2018 2005 Gross per 24 hour  Intake 330 ml  Output 0 ml  Net 330 ml   [employer]  06/18/18 2039  Weight: 81 kg    Exam: Pleasantly confused-not in any distress, No new F.N deficits, Normal affect Spur.AT,PERRAL Supple Neck,No JVD, No cervical lymphadenopathy appriciated.  Symmetrical Chest wall movement, Good air movement bilaterally, CTAB RRR,No Gallops,Rubs or new Murmurs, No Parasternal Heave +ve B.Sounds, Abd Soft, Non tender, No organomegaly appriciated, No rebound -guarding or rigidity. No Cyanosis, Clubbing or edema, No new Rash or bruise   PERTINENT RADIOLOGIC STUDIES: Ct Head Wo Contrast  Result Date: 06/19/2018 CLINICAL DATA:   85 year old male with shortness of breath weakness, altered mental status. Fever of 101.4 F. EXAM: CT HEAD WITHOUT CONTRAST TECHNIQUE: Contiguous axial images were obtained from the base of the skull through the vertex without intravenous contrast. COMPARISON:  Head CT 01/14/2017, MRI 12/26/2016 and earlier. FINDINGS: Brain: Stable cerebral volume. Stable ventricle size and configuration. No midline shift, mass effect, or evidence of intracranial mass lesion. No acute intracranial hemorrhage identified. Stable gray-white matter differentiation throughout the brain. No cortically based acute infarct identified. Vascular: Calcified atherosclerosis at the skull base. No suspicious intracranial vascular hyperdensity. Skull: No acute osseous abnormality identified. Sinuses/Orbits: Visualized paranasal sinuses and mastoids are stable and well pneumatized. Other: Stable orbit and scalp soft tissues. IMPRESSION: Stable non contrast CT appearance of the brain since 2018. No acute intracranial abnormality. Electronically Signed   By: Genevie Ann M.D.   On: 06/19/2018 00:24   Dg Chest Portable 1 View  Result Date: 06/18/2018 CLINICAL DATA:  85 year old male with recent pneumonia. Fever and shortness of breath. COVID-19 status is pending. EXAM: PORTABLE CHEST 1 VIEW COMPARISON:  01/14/2017 and earlier. FINDINGS: Portable AP semi upright view at 2042 hours. Ill-defined abnormal right upper lobe opacity near the hilum is new since 2018. there is also patchy confluent opacity at the right lateral costophrenic angle. There is mild superimposed asymmetric indistinct and interstitial opacity in both lungs. Chronic prosthetic cardiac valve. Stable cardiac size and mediastinal contours. Calcified aortic atherosclerosis. Visualized tracheal air column is within normal limits. No pneumothorax or pleural effusion. Negative visible bowel gas pattern. No acute osseous abnormality identified. IMPRESSION: Abnormal right greater than left  pulmonary opacity most confluent in the right upper lobe opacity. Consider acute pneumonia versus viral/atypical respiratory infection. No pleural effusion. Electronically Signed   By: Genevie Ann M.D.   On: 06/18/2018 21:43     PERTINENT LAB RESULTS: CBC: No results for input(s): WBC, HGB, HCT, PLT in the last 72 hours. CMET CMP     Component Value Date/Time   NA 146 (H) 06/25/2018 0313   NA 146 03/04/2016   K 3.8 06/25/2018 0313   CL 109 06/25/2018 0313   CO2 27 06/25/2018 0313   GLUCOSE 102 (H) 06/25/2018 0313   BUN 31 (H) 06/25/2018 0313   BUN 11 03/04/2016   CREATININE 1.07 06/26/2018 0435   CREATININE 1.02 03/12/2015 1203   CALCIUM 8.4 (L) 06/25/2018 0313   PROT 5.4 (L) 06/25/2018 0313   ALBUMIN 2.7 (L) 06/25/2018 0313   AST 75 (H) 06/25/2018 0313   ALT 105 (H) 06/25/2018 0313   ALKPHOS 67 06/25/2018 0313   BILITOT 0.7 06/25/2018 0313   GFRNONAA >60 06/26/2018 0435   GFRAA >60 06/26/2018 0435    GFR Estimated Creatinine Clearance: 55.4 mL/min (by C-G formula based on SCr of 1.07 mg/dL). No results for input(s): LIPASE, AMYLASE in the last 72 hours. No results for input(s): CKTOTAL, CKMB, CKMBINDEX, TROPONINI in the last 72 hours. Invalid input(s): POCBNP No results for input(s): DDIMER in  the last 72 hours. No results for input(s): HGBA1C in the last 72 hours. No results for input(s): CHOL, HDL, LDLCALC, TRIG, CHOLHDL, LDLDIRECT in the last 72 hours. No results for input(s): TSH, T4TOTAL, T3FREE, THYROIDAB in the last 72 hours.  Invalid input(s): FREET3 No results for input(s): VITAMINB12, FOLATE, FERRITIN, TIBC, IRON, RETICCTPCT in the last 72 hours. Coags: No results for input(s): INR in the last 72 hours.  Invalid input(s): PT Microbiology: Recent Results (from the past 240 hour(s))  Respiratory Panel by PCR     Status: None   Collection Time: 06/21/18  3:15 PM  Result Value Ref Range Status   Adenovirus NOT DETECTED NOT DETECTED Final   Coronavirus 229E NOT  DETECTED NOT DETECTED Final    Comment: (NOTE) The Coronavirus on the Respiratory Panel, DOES NOT test for the novel  Coronavirus (2019 nCoV)    Coronavirus HKU1 NOT DETECTED NOT DETECTED Final   Coronavirus NL63 NOT DETECTED NOT DETECTED Final   Coronavirus OC43 NOT DETECTED NOT DETECTED Final   Metapneumovirus NOT DETECTED NOT DETECTED Final   Rhinovirus / Enterovirus NOT DETECTED NOT DETECTED Final   Influenza A NOT DETECTED NOT DETECTED Final   Influenza B NOT DETECTED NOT DETECTED Final   Parainfluenza Virus 1 NOT DETECTED NOT DETECTED Final   Parainfluenza Virus 2 NOT DETECTED NOT DETECTED Final   Parainfluenza Virus 3 NOT DETECTED NOT DETECTED Final   Parainfluenza Virus 4 NOT DETECTED NOT DETECTED Final   Respiratory Syncytial Virus NOT DETECTED NOT DETECTED Final   Bordetella pertussis NOT DETECTED NOT DETECTED Final   Chlamydophila pneumoniae NOT DETECTED NOT DETECTED Final   Mycoplasma pneumoniae NOT DETECTED NOT DETECTED Final    Comment: Performed at [facility], [place]. [address] [place], [place] [address]  Novel Coronavirus, NAA (hospital order; send-out to ref lab)     Status: None   Collection Time: 06/27/18  7:38 AM  Result Value Ref Range Status   SARS-CoV-2, NAA NOT DETECTED NOT DETECTED Final    Comment: (NOTE) This test was developed and its performance characteristics determined by [employer]. This test has not been FDA cleared or approved. This test has been authorized by FDA under an Emergency Use Authorization (EUA). This test is only authorized for the duration of time the declaration that circumstances exist justifying the authorization of the emergency use of in vitro diagnostic tests for detection of SARS-CoV-2 virus and/or diagnosis of COVID-19 infection under section 564(b)(1) of the Act, 21 U.S.C. 188CZY-6(A)(6), unless the authorization is terminated or revoked sooner. When diagnostic testing is negative, the possibility of a false  negative result should be considered in the context of a patient's recent exposures and the presence of clinical signs and symptoms consistent with COVID-19. An individual without symptoms of COVID-19 and who is not shedding SARS-CoV-2 virus would expect to have a negative (not detected) result in this assay. Performed  At: [facility] [place], [place] [id number] Rush Farmer MD AT:[id number]    [place]  Final    Comment: Performed at [facility] [address] [place], [place] [address]    FURTHER DISCHARGE INSTRUCTIONS:  Get Medicines reviewed and adjusted: Please take all your medications with you for your next visit with your Primary MD  Laboratory/radiological data: Please request your Primary MD to go over all hospital tests and procedure/radiological results at the follow up, please ask your Primary MD to get all Hospital records sent to his/her  office.  In some cases, they will be blood work, cultures and biopsy results pending at the time of your discharge. Please request that your primary care M.D. goes through all the records of your hospital data and follows up on these results.  Also Note the following: If you experience worsening of your admission symptoms, develop shortness of breath, life threatening emergency, suicidal or homicidal thoughts you must seek medical attention immediately by calling 911 or calling your MD immediately  if symptoms less severe.  You must read complete instructions/literature along with all the possible adverse reactions/side effects for all the Medicines you take and that have been prescribed to you. Take any new Medicines after you have completely understood and accpet all the possible adverse reactions/side effects.   Do not drive when taking Pain medications or sleeping medications (Benzodaizepines)  Do not take more than prescribed Pain, Sleep and Anxiety  Medications. It is not advisable to combine anxiety,sleep and pain medications without talking with your primary care practitioner  Special Instructions: If you have smoked or chewed Tobacco  in the last 2 yrs please stop smoking, stop any regular Alcohol  and or any Recreational drug use.  Wear Seat belts while driving.  Please note: You were cared for by a hospitalist during your hospital stay. Once you are discharged, your primary care physician will handle any further medical issues. Please note that NO REFILLS for any discharge medications will be authorized once you are discharged, as it is imperative that you return to your primary care physician (or establish a relationship with a primary care physician if you do not have one) for your post hospital discharge needs so that they can reassess your need for medications and monitor your lab values.  Total Time spent coordinating discharge including counseling, education and face to face time equals 35 minutes.  SignedOren Binet 06/29/2018 10:25 AM

## 2018-06-29 NOTE — Progress Notes (Signed)
Spoke to [employer], Education officer, museum about patient's discharge. She informed me that [facility] will not take patient back and we would need to find another facility to send patient. Will await further instruction with regard to patient discharge.

## 2018-06-29 NOTE — [place] FL2 (Signed)
[place] MEDICAID FL2 LEVEL OF CARE SCREENING TOOL     IDENTIFICATION  Patient Name: Marvin Wagner Birthdate: April 11, 1930 Sex: male Admission Date (Current Location): 06/18/2018  [facility] and [place] Number:  Herbalist and Address:  [facility] [place], [place] [address], [place], [place])      Provider Number: [id number]  Attending Physician Name and Address:  Jonetta Osgood, MD  Relative Name and Phone Number:  Katai Marsico; [phone]; daughter    Current Level of Care: Hospital Recommended Level of Care: [place] Prior Approval Number:    Date Approved/Denied:   PASRR Number: [id number]  Discharge Plan: SNF    Current Diagnoses: Patient Active Problem List   Diagnosis Date Noted  . Community acquired pneumonia 06/19/2018  . Acute encephalopathy 06/19/2018  . CAP (community acquired pneumonia) 06/19/2018  . Acute blood loss anemia 02/27/2016  . UTI (urinary tract infection) 02/27/2016  . Pressure injury of skin 02/25/2016  . Hematoma of chest wall, left, initial encounter 02/24/2016  . Fever 02/24/2016  . Supratherapeutic INR 02/24/2016  . Dementia ([place]) 03/04/2015  . Hypotension 12/03/2014  . Weight loss, unintentional 10/17/2013  . History of elevated PSA 04/25/2013  . Encounter for therapeutic drug monitoring 02/26/2013  . Long term current use of anticoagulant therapy 12/19/2012  . Other vitamin B12 deficiency anemia 02/29/2012  . Memory disorder 02/14/2012  . Hx of CABG 07/01/2010  . Hyperlipidemia 07/01/2010  . Benign hypertensive heart disease without heart failure 07/01/2010    Orientation RESPIRATION BLADDER Height & Weight     (disoriented x 4)  Normal Incontinent Weight: 178 lb 9.2 oz (81 kg) Height:  6' (182.9 cm)  BEHAVIORAL SYMPTOMS/MOOD NEUROLOGICAL BOWEL NUTRITION STATUS      Incontinent Diet(see DC summary)  AMBULATORY STATUS COMMUNICATION OF NEEDS Skin    Extensive Assist Verbally Normal                       Personal Care Assistance Level of Assistance  Bathing, Feeding, Dressing Bathing Assistance: Maximum assistance Feeding assistance: Maximum assistance Dressing Assistance: Maximum assistance     Functional Limitations Info  Sight, Hearing, Speech Sight Info: Adequate Hearing Info: Adequate Speech Info: Impaired(dysarthria)    SPECIAL CARE FACTORS FREQUENCY  PT (By licensed PT), OT (By licensed OT)     PT Frequency: min 5x weekly OT Frequency: min 5x weekly            Contractures Contractures Info: Not present    Additional Factors Info  Code Status, Allergies, Psychotropic Code Status Info: DNR Allergies Info: Penicillins Psychotropic Info: Aricept 10mg  daily         Current Medications (06/29/2018):  This is the current hospital active medication list Current Facility-Administered Medications  Medication Dose Route Frequency Provider Last Rate Last Dose  . acetaminophen (TYLENOL) tablet 650 mg  650 mg Oral Q6H PRN Rise Patience, MD   650 mg at 06/19/18 1206   Or  . acetaminophen (TYLENOL) suppository 650 mg  650 mg Rectal Q6H PRN Rise Patience, MD      . aspirin EC tablet 81 mg  81 mg Oral Daily Rise Patience, MD   81 mg at 06/29/18 0950  . atorvastatin (LIPITOR) tablet 40 mg  40 mg Oral Daily Rise Patience, MD   40 mg at 06/29/18 0950  . donepezil (ARICEPT) tablet 10 mg  10 mg Oral QHS Rise Patience,  MD   10 mg at 06/28/18 2122  . enoxaparin (LOVENOX) injection 40 mg  40 mg Subcutaneous Q24H Rise Patience, MD   40 mg at 06/29/18 0949  . lip balm (CARMEX) ointment   Topical PRN Jonetta Osgood, MD      . LORazepam (ATIVAN) injection 1 mg  1 mg Intravenous Q4H PRN Patrecia Pour, MD   1 mg at 06/28/18 1652  . MEDLINE mouth rinse  15 mL Mouth Rinse BID Darliss Cheney, MD   15 mL at 06/29/18 0951  . Melatonin TABS 1 mg  1 mg Oral QHS Rise Patience, MD   1 mg  at 06/28/18 2122  . memantine (NAMENDA) tablet 10 mg  10 mg Oral BID Rise Patience, MD   10 mg at 06/29/18 0951  . metoprolol tartrate (LOPRESSOR) tablet 12.5 mg  12.5 mg Oral Daily Rise Patience, MD   12.5 mg at 06/29/18 0950  . ondansetron (ZOFRAN) tablet 4 mg  4 mg Oral Q6H PRN Rise Patience, MD       Or  . ondansetron [facility]) injection 4 mg  4 mg Intravenous Q6H PRN Rise Patience, MD      . risperiDONE (RISPERDAL) tablet 0.25 mg  0.25 mg Oral Q6H PRN Rise Patience, MD   0.25 mg at 06/28/18 1544     Discharge Medications: Please see discharge summary for a list of discharge medications.  Relevant Imaging Results:  Relevant Lab Results:   Additional Information SS#: [id number]  Alberteen Sam, LCSW

## 2018-06-30 LAB — GLUCOSE, CAPILLARY
Glucose-Capillary: 113 mg/dL — ABNORMAL HIGH (ref 70–99)
Glucose-Capillary: 87 mg/dL (ref 70–99)
Glucose-Capillary: 87 mg/dL (ref 70–99)
Glucose-Capillary: 90 mg/dL (ref 70–99)

## 2018-06-30 NOTE — Progress Notes (Addendum)
CSW received notification from [facility] that they have spoken with the legal guardian. Legal guardian is requesting that she be allowed to pay a private duty sitter 24/7 to be with the patient, and [facility] is not currently allowing additional personnel due to safety concerns with the pandemic. Guardian is refusing the bed offer due to her concerns that the SNF does not have "appropriate staffing" to meet her requests, wanted to speak to the SNF administrator. Legal guardian made threatening remarks to the administrator of [facility] during their discussion. Due to the threats made from the legal guardian to the facility staff, they are rescinding their bed offer to the patient.  Patient has no other bed offers at this time as the legal guardian has refused the offer from [facility]. CSW spoke with legal guardian to ensure that she was aware that he was medically stable for discharge and that she has refused a bed offer, so she runs the risk of receiving a bill from the hospital if Medicare does not see that he meets medical necessity for a hospitalization. Legal guardian is aware that the patient is medically stable for discharge at this time, and from her perspective she does not believe that she "refused" the bed offer, but that they are not able to meet her needs so the blame should fall on [facility] instead. Legal guardian placed CSW on speakerphone during discussion so that her attorney could hear what CSW was saying and denied that she was the one who refused. Legal guardian is aware that the patient has no other bed offers at this time, and indicated that the responsibility now falls on CSW to find another bed offer for the patient. Legal guardian's preference would be for [facility], but they have no beds available at this time and have not offered a bed for the patient when one becomes available. CSW discussed that with the guardian, and she is aware.   CSW to follow.  Laveda Abbe, [place] Clinical Social  Worker [phone]

## 2018-06-30 NOTE — Progress Notes (Signed)
PROGRESS NOTE                                                                                                                                                                                                             Patient Demographics:    Marvin Wagner, is a 85 y.o. male, DOB - 04/03/1930, VZC:[id number]  Outpatient Primary MD for the patient is Burchette, Alinda Sierras, MD    LOS - 11  Chief Complaint  Patient presents with  . Shortness of Breath       Brief Narrative: Patient is a 85 y.o. male with PMHx of dementia, AFib, HTN, and CAD who presented with confusion and fever found to have covid-19.   Subjective:    Marvin Wagner remains essentially unchanged-looks comfortable-but pleasantly confused.  Moving all 4 extremities.   Assessment  & Plan :   COVID-19 viral pneumonia: Improved-patient is s/p Actemra and steroids.  Inflammatory markers have trended down, patient is on room air.  Awaiting SNF placement-repeat COVID 19 test negative.  COVID-19 Labs:  No results for input(s): DDIMER, FERRITIN, LDH, CRP in the last 72 hours.  Lab Results  Component Value Date   SARSCOV2NAA NOT DETECTED 06/27/2018   SARSCOV2NAA POSITIVE (A) 06/19/2018   [place] NEGATIVE 06/18/2018     COVID-19 Medications: 5/20>> Actemra 5/19>> 5/22>> Solu-Medrol  Transaminitis: Probably secondary to COVID-19 or from Actemra-continuous to slowly downtrend.  Follow periodically.   AKI: Resolved-likely hemodynamically mediated  Hypernatremia: Improved-monitor periodically.  Acute metabolic encephalopathy: Secondary to COVID-19 infection-improved with supportive care.  Has dementia at baseline-suspect back to usual baseline.  Chronic atrial fibrillation: Rate controlled with metoprolol.  Not a candidate for anticoagulation.  Remains on aspirin  CAD: No anginal symptoms-continue aspirin/metoprolol and statin  Dementia with delirium:  Pleasantly confused-suspect not far from baseline-continue Aricept/Namenda and Risperdal.    Dysphagia: Probably related to dementia-evaluated by SLP-continue dysphagia 3 diet    ABG:    Component Value Date/Time   PHART 7.371 12/17/2008 2024   PCO2ART 42.2 12/17/2008 2024   PO2ART 162.0 (H) 12/17/2008 2024   HCO3 24.2 (H) 12/17/2008 2024   TCO2 22 12/17/2008 2028   ACIDBASEDEF 1.0 12/17/2008 2024   O2SAT 99.0 12/17/2008 2024    Condition - stable  Family Communication  : Spoke with patient's POA over the phone this morning.  Code  Status :  DNR  Diet : Dysphagia 3 diet  Disposition Plan  :  Remain inpatient-SNF vs ALF over the next few days-social worker following-await repeat COVID testing  Consults  :  None  Procedures  :  None  DVT Prophylaxis  :  Lovenox   Lab Results  Component Value Date   PLT 214 06/25/2018    Inpatient Medications  Scheduled Meds: . aspirin EC  81 mg Oral Daily  . atorvastatin  40 mg Oral Daily  . donepezil  10 mg Oral QHS  . enoxaparin (LOVENOX) injection  40 mg Subcutaneous Q24H  . mouth rinse  15 mL Mouth Rinse BID  . Melatonin  1 mg Oral QHS  . memantine  10 mg Oral BID  . metoprolol tartrate  12.5 mg Oral Daily   Continuous Infusions: PRN Meds:.acetaminophen **OR** acetaminophen, lip balm, LORazepam, ondansetron **OR** ondansetron (ZOFRAN) IV, risperiDONE  Antibiotics  :    Anti-infectives (From admission, onward)   Start     Dose/Rate Route Frequency Ordered Stop   06/19/18 2000  cefTRIAXone (ROCEPHIN) 1 g in sodium chloride 0.9 % 100 mL IVPB  Status:  Discontinued     1 g 200 mL/hr over 30 Minutes Intravenous Every 24 hours 06/19/18 0014 06/20/18 1811   06/19/18 2000  azithromycin (ZITHROMAX) 500 mg in sodium chloride 0.9 % 250 mL IVPB  Status:  Discontinued     500 mg 250 mL/hr over 60 Minutes Intravenous Every 24 hours 06/19/18 0014 06/20/18 1811   06/18/18 2215  cefTRIAXone (ROCEPHIN) 1 g in sodium chloride 0.9 % 100 mL  IVPB     1 g 200 mL/hr over 30 Minutes Intravenous  Once 06/18/18 2209 06/19/18 0006   06/18/18 2215  azithromycin (ZITHROMAX) 500 mg in sodium chloride 0.9 % 250 mL IVPB     500 mg 250 mL/hr over 60 Minutes Intravenous  Once 06/18/18 2209 06/19/18 0006       Time Spent in minutes  15   Oren Binet M.D on 06/30/2018 at 11:37 AM  To page go to [email or web address] - use universal password  [facility] -  Office  [phone]   See all Orders from today for further details   Admit date - 06/18/2018    11    Objective:   Vitals:   06/30/18 0400 06/30/18 0402 06/30/18 0800 06/30/18 0843  BP: (!) 142/53  129/72 (!) 145/74  Pulse: 62 (!) 58 88 85  Resp:      Temp:   98.4 F (36.9 C)   TempSrc:   Axillary   SpO2: (!) 89% 96% 94% 91%  Weight:      Height:        Wt Readings from Last 3 Encounters:  06/18/18 81 kg  09/21/17 79.4 kg  02/08/17 77.9 kg    No intake or output data in the 24 hours ending 06/30/18 1137   Physical Exam General appearance: Awake-pleasantly confused.  Not in any distress.   Eyes: No scleral icterus HEENT: Atraumatic and Normocephalic Neck: supple Resp: Clear to auscultation anteriorly CVS: S1 S2 regular GI: Bowel sounds present, Non tender and not distended with no gaurding, rigidity or rebound. Extremities: No edema Neurology: Moving all 4 extremities-appears to be nonfocal.   Data Review:    CBC Recent Labs  Lab 06/24/18 0656 06/25/18 0313  WBC 8.2 8.0  HGB 12.2* 11.6*  HCT 35.4* 36.0*  PLT 205 214  MCV 97.3 100.3*  MCH 33.5 32.3  MCHC  34.5 32.2  RDW 12.4 12.4  LYMPHSABS 1.2 1.5  MONOABS 0.3 0.4  EOSABS 0.0 0.0  BASOSABS 0.0 0.0    Chemistries  Recent Labs  Lab 06/24/18 0656 06/25/18 0313 06/26/18 0435  NA 142 146*  --   K 3.6 3.8  --   CL 110 109  --   CO2 24 27  --   GLUCOSE 101* 102*  --   BUN 26* 31*  --   CREATININE 0.91 0.97 1.07  CALCIUM 7.9* 8.4*  --   MG 2.5*  --   --   AST 96* 75*  --    ALT 112* 105*  --   ALKPHOS 70 67  --   BILITOT 0.6 0.7  --    ------------------------------------------------------------------------------------------------------------------ No results for input(s): CHOL, HDL, LDLCALC, TRIG, CHOLHDL, LDLDIRECT in the last 72 hours.  Lab Results  Component Value Date   HGBA1C  12/15/2008    5.3 (NOTE) The ADA recommends the following therapeutic goal for glycemic control related to Hgb A1c measurement: Goal of therapy: <6.5 Hgb A1c  Reference: American Diabetes Association: Clinical Practice Recommendations 2010, Diabetes Care, 2010, 33: (Suppl  1).   ------------------------------------------------------------------------------------------------------------------ No results for input(s): TSH, T4TOTAL, T3FREE, THYROIDAB in the last 72 hours.  Invalid input(s): FREET3 ------------------------------------------------------------------------------------------------------------------ No results for input(s): VITAMINB12, FOLATE, FERRITIN, TIBC, IRON, RETICCTPCT in the last 72 hours.  Coagulation profile No results for input(s): INR, PROTIME in the last 168 hours.  No results for input(s): DDIMER in the last 72 hours.  Cardiac Enzymes No results for input(s): CKMB, TROPONINI, MYOGLOBIN in the last 168 hours.  Invalid input(s): CK ------------------------------------------------------------------------------------------------------------------    Component Value Date/Time   BNP 175.0 (H) 01/14/2017 1058    Micro Results Recent Results (from the past 240 hour(s))  Respiratory Panel by PCR     Status: None   Collection Time: 06/21/18  3:15 PM  Result Value Ref Range Status   Adenovirus NOT DETECTED NOT DETECTED Final   Coronavirus 229E NOT DETECTED NOT DETECTED Final    Comment: (NOTE) The Coronavirus on the Respiratory Panel, DOES NOT test for the novel  Coronavirus (2019 nCoV)    Coronavirus HKU1 NOT DETECTED NOT DETECTED Final    Coronavirus NL63 NOT DETECTED NOT DETECTED Final   Coronavirus OC43 NOT DETECTED NOT DETECTED Final   Metapneumovirus NOT DETECTED NOT DETECTED Final   Rhinovirus / Enterovirus NOT DETECTED NOT DETECTED Final   Influenza A NOT DETECTED NOT DETECTED Final   Influenza B NOT DETECTED NOT DETECTED Final   Parainfluenza Virus 1 NOT DETECTED NOT DETECTED Final   Parainfluenza Virus 2 NOT DETECTED NOT DETECTED Final   Parainfluenza Virus 3 NOT DETECTED NOT DETECTED Final   Parainfluenza Virus 4 NOT DETECTED NOT DETECTED Final   Respiratory Syncytial Virus NOT DETECTED NOT DETECTED Final   Bordetella pertussis NOT DETECTED NOT DETECTED Final   Chlamydophila pneumoniae NOT DETECTED NOT DETECTED Final   Mycoplasma pneumoniae NOT DETECTED NOT DETECTED Final    Comment: Performed at [facility], [address], [place], [place] [address]  Novel Coronavirus, NAA (hospital order; send-out to ref lab)     Status: None   Collection Time: 06/27/18  7:38 AM  Result Value Ref Range Status   SARS-CoV-2, NAA NOT DETECTED NOT DETECTED Final    Comment: (NOTE) This test was developed and its performance characteristics determined by [employer]. This test has not been FDA cleared or approved. This test has been authorized by  FDA under an Emergency Use Authorization (EUA). This test is only authorized for the duration of time the declaration that circumstances exist justifying the authorization of the emergency use of in vitro diagnostic tests for detection of SARS-CoV-2 virus and/or diagnosis of COVID-19 infection under section 564(b)(1) of the Act, 21 U.S.C. 426STM-1(D)(6), unless the authorization is terminated or revoked sooner. When diagnostic testing is negative, the possibility of a false negative result should be considered in the context of a patient's recent exposures and the presence of clinical signs and symptoms consistent with COVID-19. An individual without symptoms of  COVID-19 and who is not shedding SARS-CoV-2 virus would expect to have a negative (not detected) result in this assay. Performed  At: [facility] [place], [place] [id number] Rush Farmer MD JJ:[id number]    [place]  Final    Comment: Performed at [facility] [address], [place], [place] [address]    Radiology Reports Ct Head Wo Contrast  Result Date: 06/19/2018 CLINICAL DATA:  85 year old male with shortness of breath weakness, altered mental status. Fever of 101.4 F. EXAM: CT HEAD WITHOUT CONTRAST TECHNIQUE: Contiguous axial images were obtained from the base of the skull through the vertex without intravenous contrast. COMPARISON:  Head CT 01/14/2017, MRI 12/26/2016 and earlier. FINDINGS: Brain: Stable cerebral volume. Stable ventricle size and configuration. No midline shift, mass effect, or evidence of intracranial mass lesion. No acute intracranial hemorrhage identified. Stable gray-white matter differentiation throughout the brain. No cortically based acute infarct identified. Vascular: Calcified atherosclerosis at the skull base. No suspicious intracranial vascular hyperdensity. Skull: No acute osseous abnormality identified. Sinuses/Orbits: Visualized paranasal sinuses and mastoids are stable and well pneumatized. Other: Stable orbit and scalp soft tissues. IMPRESSION: Stable non contrast CT appearance of the brain since 2018. No acute intracranial abnormality. Electronically Signed   By: Genevie Ann M.D.   On: 06/19/2018 00:24   Dg Chest Portable 1 View  Result Date: 06/18/2018 CLINICAL DATA:  85 year old male with recent pneumonia. Fever and shortness of breath. COVID-19 status is pending. EXAM: PORTABLE CHEST 1 VIEW COMPARISON:  01/14/2017 and earlier. FINDINGS: Portable AP semi upright view at 2042 hours. Ill-defined abnormal right upper lobe opacity near the hilum is new since 2018. there is also patchy confluent  opacity at the right lateral costophrenic angle. There is mild superimposed asymmetric indistinct and interstitial opacity in both lungs. Chronic prosthetic cardiac valve. Stable cardiac size and mediastinal contours. Calcified aortic atherosclerosis. Visualized tracheal air column is within normal limits. No pneumothorax or pleural effusion. Negative visible bowel gas pattern. No acute osseous abnormality identified. IMPRESSION: Abnormal right greater than left pulmonary opacity most confluent in the right upper lobe opacity. Consider acute pneumonia versus viral/atypical respiratory infection. No pleural effusion. Electronically Signed   By: Genevie Ann M.D.   On: 06/18/2018 21:43

## 2018-06-30 NOTE — Progress Notes (Signed)
CSW following for discharge plan. Patient has a bed offer at [facility], and legal guardian has still not agreed to admit patient to [facility] at this time. [facility] has attempted to contact legal guardian this morning with no response, left a message. CSW also contacted legal guardian to discuss, left a message. Awaiting a call back at this time.  Patient also continues to await result of second COVID test. Patient will need 2 negatives prior to admission to SNF.   CSW to follow.  Laveda Abbe, [place] Clinical Social Worker [phone]

## 2018-07-01 LAB — GLUCOSE, CAPILLARY
Glucose-Capillary: 101 mg/dL — ABNORMAL HIGH (ref 70–99)
Glucose-Capillary: 92 mg/dL (ref 70–99)
Glucose-Capillary: 94 mg/dL (ref 70–99)
Glucose-Capillary: 96 mg/dL (ref 70–99)

## 2018-07-01 NOTE — Progress Notes (Signed)
PROGRESS NOTE                                                                                                                                                                                                             Patient Demographics:    Marvin Wagner, is a 85 y.o. male, DOB - 08-Jun-1930, WVP:[id number]  Outpatient Primary MD for the patient is Burchette, Alinda Sierras, MD    LOS - 12  Chief Complaint  Patient presents with  . Shortness of Breath       Brief Narrative: Patient is a 85 y.o. male with PMHx of dementia, AFib, HTN, and CAD who presented with confusion and fever found to have covid-19.   Subjective:    Keijuan Schellhase mains confused-moves all 4 extremities.  Spoke with RN-no major issues overnight  Assessment  & Plan :   COVID-19 viral pneumonia: Improved-patient is s/p Actemra and steroids.  Inflammatory markers have trended down, patient is on room air.  Awaiting SNF placement-repeat COVID 19 test negative.  COVID-19 Labs:  No results for input(s): DDIMER, FERRITIN, LDH, CRP in the last 72 hours.  Lab Results  Component Value Date   SARSCOV2NAA NOT DETECTED 06/27/2018   SARSCOV2NAA POSITIVE (A) 06/19/2018   [place] NEGATIVE 06/18/2018     COVID-19 Medications: 5/20>> Actemra 5/19>> 5/22>> Solu-Medrol  Transaminitis: Probably secondary to COVID-19 or from Actemra-continuous to slowly downtrend.  Follow periodically.   AKI: Resolved-likely hemodynamically mediated  Hypernatremia: Improved-monitor periodically.  Acute metabolic encephalopathy: Secondary to COVID-19 infection-improved with supportive care.  Has dementia at baseline-suspect back to usual baseline.  Chronic atrial fibrillation: Rate controlled with metoprolol.  Not a candidate for anticoagulation.  Remains on aspirin  CAD: No anginal symptoms-continue aspirin/metoprolol and statin  Dementia with delirium: Pleasantly  confused-suspect not far from baseline-continue Aricept/Namenda and Risperdal.    Dysphagia: Probably related to dementia-evaluated by SLP-continue dysphagia 3 diet    ABG:    Component Value Date/Time   PHART 7.371 12/17/2008 2024   PCO2ART 42.2 12/17/2008 2024   PO2ART 162.0 (H) 12/17/2008 2024   HCO3 24.2 (H) 12/17/2008 2024   TCO2 22 12/17/2008 2028   ACIDBASEDEF 1.0 12/17/2008 2024   O2SAT 99.0 12/17/2008 2024    Condition - stable  Family Communication  : Spoke with patient's POA over the phone this morning.  Code  Status :  DNR  Diet : Dysphagia 3 diet  Disposition Plan  :  Remain inpatient-SNF vs ALF over the next few days-social worker following-await repeat COVID testing  Consults  :  None  Procedures  :  None  DVT Prophylaxis  :  Lovenox   Lab Results  Component Value Date   PLT 214 06/25/2018    Inpatient Medications  Scheduled Meds: . aspirin EC  81 mg Oral Daily  . atorvastatin  40 mg Oral Daily  . donepezil  10 mg Oral QHS  . enoxaparin (LOVENOX) injection  40 mg Subcutaneous Q24H  . mouth rinse  15 mL Mouth Rinse BID  . Melatonin  1 mg Oral QHS  . memantine  10 mg Oral BID  . metoprolol tartrate  12.5 mg Oral Daily   Continuous Infusions: PRN Meds:.acetaminophen **OR** acetaminophen, lip balm, LORazepam, ondansetron **OR** ondansetron (ZOFRAN) IV, risperiDONE  Antibiotics  :    Anti-infectives (From admission, onward)   Start     Dose/Rate Route Frequency Ordered Stop   06/19/18 2000  cefTRIAXone (ROCEPHIN) 1 g in sodium chloride 0.9 % 100 mL IVPB  Status:  Discontinued     1 g 200 mL/hr over 30 Minutes Intravenous Every 24 hours 06/19/18 0014 06/20/18 1811   06/19/18 2000  azithromycin (ZITHROMAX) 500 mg in sodium chloride 0.9 % 250 mL IVPB  Status:  Discontinued     500 mg 250 mL/hr over 60 Minutes Intravenous Every 24 hours 06/19/18 0014 06/20/18 1811   06/18/18 2215  cefTRIAXone (ROCEPHIN) 1 g in sodium chloride 0.9 % 100 mL IVPB      1 g 200 mL/hr over 30 Minutes Intravenous  Once 06/18/18 2209 06/19/18 0006   06/18/18 2215  azithromycin (ZITHROMAX) 500 mg in sodium chloride 0.9 % 250 mL IVPB     500 mg 250 mL/hr over 60 Minutes Intravenous  Once 06/18/18 2209 06/19/18 0006       Time Spent in minutes  15   Oren Binet M.D on 07/01/2018 at 11:07 AM  To page go to [email or web address] - use universal password  [facility] -  Office  [phone]   See all Orders from today for further details   Admit date - 06/18/2018    12    Objective:   Vitals:   06/30/18 1607 06/30/18 2025 07/01/18 0327 07/01/18 0800  BP: 129/73 96/77 (!) 143/67 (!) 142/76  Pulse: 81 82 69   Resp:      Temp:  98 F (36.7 C) (!) 97.5 F (36.4 C)   TempSrc:  Oral Axillary   SpO2: 97% 93% 100%   Weight:      Height:        Wt Readings from Last 3 Encounters:  06/18/18 81 kg  09/21/17 79.4 kg  02/08/17 77.9 kg     Intake/Output Summary (Last 24 hours) at 07/01/2018 1107 Last data filed at 07/01/2018 0800 Gross per 24 hour  Intake 0 ml  Output -  Net 0 ml     Physical Exam General appearance: Awake-pleasantly confused.  Not in any distress.   Eyes: No scleral icterus HEENT: Atraumatic and Normocephalic Neck: supple Resp: Clear to auscultation anteriorly CVS: S1 S2 regular GI: Bowel sounds present, Non tender and not distended with no gaurding, rigidity or rebound. Extremities: No edema Neurology: Moving all 4 extremities-appears to be nonfocal.   Data Review:    CBC Recent Labs  Lab 06/25/18 0313  WBC 8.0  HGB 11.6*  HCT 36.0*  PLT 214  MCV 100.3*  MCH 32.3  MCHC 32.2  RDW 12.4  LYMPHSABS 1.5  MONOABS 0.4  EOSABS 0.0  BASOSABS 0.0    Chemistries  Recent Labs  Lab 06/25/18 0313 06/26/18 0435  NA 146*  --   K 3.8  --   CL 109  --   CO2 27  --   GLUCOSE 102*  --   BUN 31*  --   CREATININE 0.97 1.07  CALCIUM 8.4*  --   AST 75*  --   ALT 105*  --   ALKPHOS 67  --   BILITOT 0.7  --     ------------------------------------------------------------------------------------------------------------------ No results for input(s): CHOL, HDL, LDLCALC, TRIG, CHOLHDL, LDLDIRECT in the last 72 hours.  Lab Results  Component Value Date   HGBA1C  12/15/2008    5.3 (NOTE) The ADA recommends the following therapeutic goal for glycemic control related to Hgb A1c measurement: Goal of therapy: <6.5 Hgb A1c  Reference: American Diabetes Association: Clinical Practice Recommendations 2010, Diabetes Care, 2010, 33: (Suppl  1).   ------------------------------------------------------------------------------------------------------------------ No results for input(s): TSH, T4TOTAL, T3FREE, THYROIDAB in the last 72 hours.  Invalid input(s): FREET3 ------------------------------------------------------------------------------------------------------------------ No results for input(s): VITAMINB12, FOLATE, FERRITIN, TIBC, IRON, RETICCTPCT in the last 72 hours.  Coagulation profile No results for input(s): INR, PROTIME in the last 168 hours.  No results for input(s): DDIMER in the last 72 hours.  Cardiac Enzymes No results for input(s): CKMB, TROPONINI, MYOGLOBIN in the last 168 hours.  Invalid input(s): CK ------------------------------------------------------------------------------------------------------------------    Component Value Date/Time   BNP 175.0 (H) 01/14/2017 1058    Micro Results Recent Results (from the past 240 hour(s))  Respiratory Panel by PCR     Status: None   Collection Time: 06/21/18  3:15 PM  Result Value Ref Range Status   Adenovirus NOT DETECTED NOT DETECTED Final   Coronavirus 229E NOT DETECTED NOT DETECTED Final    Comment: (NOTE) The Coronavirus on the Respiratory Panel, DOES NOT test for the novel  Coronavirus (2019 nCoV)    Coronavirus HKU1 NOT DETECTED NOT DETECTED Final   Coronavirus NL63 NOT DETECTED NOT DETECTED Final   Coronavirus OC43 NOT  DETECTED NOT DETECTED Final   Metapneumovirus NOT DETECTED NOT DETECTED Final   Rhinovirus / Enterovirus NOT DETECTED NOT DETECTED Final   Influenza A NOT DETECTED NOT DETECTED Final   Influenza B NOT DETECTED NOT DETECTED Final   Parainfluenza Virus 1 NOT DETECTED NOT DETECTED Final   Parainfluenza Virus 2 NOT DETECTED NOT DETECTED Final   Parainfluenza Virus 3 NOT DETECTED NOT DETECTED Final   Parainfluenza Virus 4 NOT DETECTED NOT DETECTED Final   Respiratory Syncytial Virus NOT DETECTED NOT DETECTED Final   Bordetella pertussis NOT DETECTED NOT DETECTED Final   Chlamydophila pneumoniae NOT DETECTED NOT DETECTED Final   Mycoplasma pneumoniae NOT DETECTED NOT DETECTED Final    Comment: Performed at [facility], [address] [place], [place] [address]  Novel Coronavirus, NAA (hospital order; send-out to ref lab)     Status: None   Collection Time: 06/27/18  7:38 AM  Result Value Ref Range Status   SARS-CoV-2, NAA NOT DETECTED NOT DETECTED Final    Comment: (NOTE) This test was developed and its performance characteristics determined by [employer]. This test has not been FDA cleared or approved. This test has been authorized by FDA under an Emergency Use Authorization (EUA). This test is only authorized for the duration of  time the declaration that circumstances exist justifying the authorization of the emergency use of in vitro diagnostic tests for detection of SARS-CoV-2 virus and/or diagnosis of COVID-19 infection under section 564(b)(1) of the Act, 21 U.S.C. 580DXI-3(J)(8), unless the authorization is terminated or revoked sooner. When diagnostic testing is negative, the possibility of a false negative result should be considered in the context of a patient's recent exposures and the presence of clinical signs and symptoms consistent with COVID-19. An individual without symptoms of COVID-19 and who is not shedding SARS-CoV-2 virus would expect to have a  negative (not detected) result in this assay. Performed  At: [facility] [place], [place] [id number] Rush Farmer MD HA:[id number]    [place]  Final    Comment: Performed at [facility] [address], [place], [place] [address]    Radiology Reports Ct Head Wo Contrast  Result Date: 06/19/2018 CLINICAL DATA:  85 year old male with shortness of breath weakness, altered mental status. Fever of 101.4 F. EXAM: CT HEAD WITHOUT CONTRAST TECHNIQUE: Contiguous axial images were obtained from the base of the skull through the vertex without intravenous contrast. COMPARISON:  Head CT 01/14/2017, MRI 12/26/2016 and earlier. FINDINGS: Brain: Stable cerebral volume. Stable ventricle size and configuration. No midline shift, mass effect, or evidence of intracranial mass lesion. No acute intracranial hemorrhage identified. Stable gray-white matter differentiation throughout the brain. No cortically based acute infarct identified. Vascular: Calcified atherosclerosis at the skull base. No suspicious intracranial vascular hyperdensity. Skull: No acute osseous abnormality identified. Sinuses/Orbits: Visualized paranasal sinuses and mastoids are stable and well pneumatized. Other: Stable orbit and scalp soft tissues. IMPRESSION: Stable non contrast CT appearance of the brain since 2018. No acute intracranial abnormality. Electronically Signed   By: Genevie Ann M.D.   On: 06/19/2018 00:24   Dg Chest Portable 1 View  Result Date: 06/18/2018 CLINICAL DATA:  85 year old male with recent pneumonia. Fever and shortness of breath. COVID-19 status is pending. EXAM: PORTABLE CHEST 1 VIEW COMPARISON:  01/14/2017 and earlier. FINDINGS: Portable AP semi upright view at 2042 hours. Ill-defined abnormal right upper lobe opacity near the hilum is new since 2018. there is also patchy confluent opacity at the right lateral costophrenic angle. There is mild  superimposed asymmetric indistinct and interstitial opacity in both lungs. Chronic prosthetic cardiac valve. Stable cardiac size and mediastinal contours. Calcified aortic atherosclerosis. Visualized tracheal air column is within normal limits. No pneumothorax or pleural effusion. Negative visible bowel gas pattern. No acute osseous abnormality identified. IMPRESSION: Abnormal right greater than left pulmonary opacity most confluent in the right upper lobe opacity. Consider acute pneumonia versus viral/atypical respiratory infection. No pleural effusion. Electronically Signed   By: Genevie Ann M.D.   On: 06/18/2018 21:43

## 2018-07-02 LAB — GLUCOSE, CAPILLARY
Glucose-Capillary: 93 mg/dL (ref 70–99)
Glucose-Capillary: 129 mg/dL — ABNORMAL HIGH (ref 70–99)
Glucose-Capillary: 91 mg/dL (ref 70–99)

## 2018-07-02 LAB — NOVEL CORONAVIRUS, NAA (HOSP ORDER, SEND-OUT TO REF LAB; TAT 18-24 HRS): SARS-CoV-2, NAA: NOT DETECTED

## 2018-07-02 NOTE — TOC Progression Note (Signed)
Transition of Care [facility]) - Progression Note    Patient Details  Name: Marvin Wagner MRN: [id number] Date of Birth: 08-29-1930  [facility] CM/SW Bunk Foss, LCSW Phone Number: 07/02/2018, 4:21 PM  Clinical Narrative:    CSW spoke with patient's legal guardian, Marvin Wagner, and provided only available bed offer for patient, Marvin Wagner. Marvin Wagner reports that she has heard great things about Marvin Wagner and will meet with her team to see if we can proceed with that choice.    Expected Discharge Plan: Assisted Living(Memory Care) Barriers to Discharge: Other (comment)(from [facility] at [facility])  Expected Discharge Plan and Services Expected Discharge Plan: Assisted Living(Memory Care)       Living arrangements for the past 2 months: [place] Expected Discharge Date: 06/29/18                                     Social Determinants of Health (SDOH) Interventions    Readmission Risk Interventions No flowsheet data found.

## 2018-07-02 NOTE — Progress Notes (Signed)
Physical Therapy Treatment Patient Details Name: Marvin Wagner MRN: [id number] DOB: [birth year] Today's Date: 07/02/2018    History of Present Illness 85 y.o. male admitted 06/19/18 due to fever and confusion.  Pt dx with COVID after second test (initial test was negative) came back positive. Pt also with LFT elevation, AKI, hypernatremia, hypokalemia, acute metabolic encephalopathy.  Pt with other significant PMH of HTN, CAD, aortic stenosis, dementia, and aortic valve replacement.      PT Comments    Pt with better initiation to EOB and to stand with RW today.  One person mod assist to get up and over to the recliner chair.  PT will continue to follow acutely for safe mobility progression   Follow Up Recommendations  SNF     Equipment Recommendations  Wheelchair (measurements PT);Wheelchair cushion (measurements PT);Hospital bed    Recommendations for Other Services   NA     Precautions / Restrictions Precautions Precautions: Fall    Mobility  Bed Mobility Overal bed mobility: Needs Assistance Bed Mobility: Supine to Sit     Supine to sit: Mod assist;HOB elevated     General bed mobility comments: Mod assist only to help pt scoot out from the middle of the bed, pt managing both legs to EOB and min hand held assist at trunk to pull up to sitting EOB.   Transfers Overall transfer level: Needs assistance Equipment used: Rolling walker (2 wheeled) Transfers: Sit to/from [facility] Sit to Stand: Mod assist Stand pivot transfers: Mod assist       General transfer comment: One person mod assist to stand multiple times (once for pericare, once for stand pivot transfer).  Mod assist to support trunk to power up and for balance once standing.          Balance Overall balance assessment: Needs assistance Sitting-balance support: Feet supported;Bilateral upper extremity supported Sitting balance-Leahy Scale: Fair Sitting balance - Comments: close  supervision EOB Postural control: Posterior lean Standing balance support: Bilateral upper extremity supported Standing balance-Leahy Scale: Poor Standing balance comment: mod assist with RW, posterior preference throughout standing transfer.                             Cognition Arousal/Alertness: Awake/alert Behavior During Therapy: WFL for tasks assessed/performed Overall Cognitive Status: History of cognitive impairments - at baseline                                           General Comments General comments (skin integrity, edema, etc.): Pt happy, better initiation today with movement to EOB and to standing.  Lights on during and after session, and left music playing.        Pertinent Vitals/Pain Pain Assessment: Faces Faces Pain Scale: No hurt           PT Goals (current goals can now be found in the care plan section) Progress towards PT goals: Progressing toward goals    Frequency    Min 3X/week      PT Plan Current plan remains appropriate       AM-PAC PT "6 Clicks" Mobility   Outcome Measure  Help needed turning from your back to your side while in a flat bed without using bedrails?: A Little Help needed moving from lying on your back to sitting on the side of a flat  bed without using bedrails?: A Lot Help needed moving to and from a bed to a chair (including a wheelchair)?: A Lot Help needed standing up from a chair using your arms (e.g., wheelchair or bedside chair)?: A Lot Help needed to walk in hospital room?: Total Help needed climbing 3-5 steps with a railing? : Total 6 Click Score: 11    End of Session Equipment Utilized During Treatment: Gait belt Activity Tolerance: Patient tolerated treatment well Patient left: in chair;with call bell/phone within reach Nurse Communication: Mobility status(to RN tech) PT Visit Diagnosis: Muscle weakness (generalized) (M62.81);Difficulty in walking, not elsewhere classified  (R26.2)     Time: 5525-8948 PT Time Calculation (min) (ACUTE ONLY): 24 min  Charges:  $Therapeutic Activity: 23-37 mins                    Leona Alen B. Mykelti Goldenstein, PT, DPT  Acute Rehabilitation [phone] pager #[phone] office    07/02/2018, 12:25 PM

## 2018-07-02 NOTE — Progress Notes (Signed)
PROGRESS NOTE                                                                                                                                                                                                             Patient Demographics:    Marvin Wagner, is a 85 y.o. male, DOB - 1930-04-27, OJJ:[id number]  Outpatient Primary MD for the patient is Burchette, Alinda Sierras, MD    LOS - 31  Chief Complaint  Patient presents with   Shortness of Breath       Brief Narrative: Patient is a 85 y.o. male with PMHx of dementia, AFib, HTN, and CAD who presented with confusion and fever found to have covid-19 viral pneumonia.  Markedly better with 1 dose of Actemra and steroids.  He is now on room air-remains pleasantly confused and at baseline-currently awaiting disposition to SNF.   Subjective:    Dolphus Linch mains remains pleasantly confused.  No major issues overnight.  Moving all 4 extremities.    Assessment  & Plan :   COVID-19 viral pneumonia: Improved-patient is s/p Actemra and steroids.  Inflammatory markers have trended down, patient is on room air.  Awaiting SNF placement-repeat COVID 19 test on 5/27 negative, awaiting results of repeat test on 5/29  COVID-19 Labs:  No results for input(s): DDIMER, FERRITIN, LDH, CRP in the last 72 hours.  Lab Results  Component Value Date   SARSCOV2NAA NOT DETECTED 06/27/2018   SARSCOV2NAA POSITIVE (A) 06/19/2018   [place] NEGATIVE 06/18/2018     COVID-19 Medications: 5/20>> Actemra 5/19>> 5/22>> Solu-Medrol  Transaminitis: Probably secondary to COVID-19 or from Actemra-continuous to slowly downtrend.  Follow periodically.   AKI: Resolved-likely hemodynamically mediated  Hypernatremia: Improved-monitor periodically.  Acute metabolic encephalopathy: Secondary to COVID-19 infection-improved with supportive care.  Has dementia at baseline-suspect back to usual  baseline.  Chronic atrial fibrillation: Rate controlled with metoprolol.  Not a candidate for anticoagulation.  Remains on aspirin  CAD: No anginal symptoms-continue aspirin/metoprolol and statin  Dementia with delirium: Pleasantly confused-suspect not far from baseline-continue Aricept/Namenda and Risperdal.    Dysphagia: Probably related to dementia-evaluated by SLP-continue dysphagia 3 diet    ABG:    Component Value Date/Time   PHART 7.371 12/17/2008 2024   PCO2ART 42.2 12/17/2008 2024   PO2ART 162.0 (H) 12/17/2008 2024   HCO3 24.2 (H) 12/17/2008  2024   TCO2 22 12/17/2008 2028   ACIDBASEDEF 1.0 12/17/2008 2024   O2SAT 99.0 12/17/2008 2024    Condition - stable  Family Communication  : Spoke with patient's POA over the phone this morning.  Code Status :  DNR  Diet : Dysphagia 3 diet  Disposition Plan  :  Remain inpatient-SNF vs ALF over the next few days-social worker following-await repeat COVID testing  Consults  :  None  Procedures  :  None  DVT Prophylaxis  :  Lovenox   Lab Results  Component Value Date   PLT 214 06/25/2018    Inpatient Medications  Scheduled Meds:  aspirin EC  81 mg Oral Daily   atorvastatin  40 mg Oral Daily   donepezil  10 mg Oral QHS   enoxaparin (LOVENOX) injection  40 mg Subcutaneous Q24H   mouth rinse  15 mL Mouth Rinse BID   Melatonin  1 mg Oral QHS   memantine  10 mg Oral BID   metoprolol tartrate  12.5 mg Oral Daily   Continuous Infusions: PRN Meds:.acetaminophen **OR** acetaminophen, lip balm, LORazepam, ondansetron **OR** ondansetron (ZOFRAN) IV, risperiDONE  Antibiotics  :    Anti-infectives (From admission, onward)   Start     Dose/Rate Route Frequency Ordered Stop   06/19/18 2000  cefTRIAXone (ROCEPHIN) 1 g in sodium chloride 0.9 % 100 mL IVPB  Status:  Discontinued     1 g 200 mL/hr over 30 Minutes Intravenous Every 24 hours 06/19/18 0014 06/20/18 1811   06/19/18 2000  azithromycin (ZITHROMAX) 500 mg in  sodium chloride 0.9 % 250 mL IVPB  Status:  Discontinued     500 mg 250 mL/hr over 60 Minutes Intravenous Every 24 hours 06/19/18 0014 06/20/18 1811   06/18/18 2215  cefTRIAXone (ROCEPHIN) 1 g in sodium chloride 0.9 % 100 mL IVPB     1 g 200 mL/hr over 30 Minutes Intravenous  Once 06/18/18 2209 06/19/18 0006   06/18/18 2215  azithromycin (ZITHROMAX) 500 mg in sodium chloride 0.9 % 250 mL IVPB     500 mg 250 mL/hr over 60 Minutes Intravenous  Once 06/18/18 2209 06/19/18 0006       Time Spent in minutes  15   Oren Binet M.D on 07/02/2018 at 10:56 AM  To page go to [email or web address] - use universal password  [facility] -  Office  [phone]   See all Orders from today for further details   Admit date - 06/18/2018    13    Objective:   Vitals:   07/01/18 1400 07/01/18 1817 07/01/18 2017 07/02/18 0903  BP:  (!) 126/105 127/74 (!) 149/71  Pulse: 82 87  70  Resp:   17 16  Temp: 97.9 F (36.6 C)  98.3 F (36.8 C) 97.8 F (36.6 C)  TempSrc: Oral  Oral Oral  SpO2: 99% 100%  97%  Weight:      Height:        Wt Readings from Last 3 Encounters:  06/18/18 81 kg  09/21/17 79.4 kg  02/08/17 77.9 kg    No intake or output data in the 24 hours ending 07/02/18 1056   Physical Exam General appearance: Awake-pleasantly confused.  Not in any distress.   Eyes: No scleral icterus HEENT: Atraumatic and Normocephalic Neck: supple Resp: Clear to auscultation anteriorly CVS: S1 S2 regular GI: Bowel sounds present, Non tender and not distended with no gaurding, rigidity or rebound. Extremities: No edema Neurology: Moving all 4 extremities-appears  to be nonfocal.   Data Review:    CBC No results for input(s): WBC, HGB, HCT, PLT, MCV, MCH, MCHC, RDW, LYMPHSABS, MONOABS, EOSABS, BASOSABS, BANDABS in the last 168 hours.  Invalid input(s): NEUTRABS, BANDSABD  Chemistries  Recent Labs  Lab 06/26/18 0435  CREATININE 1.07    ------------------------------------------------------------------------------------------------------------------ No results for input(s): CHOL, HDL, LDLCALC, TRIG, CHOLHDL, LDLDIRECT in the last 72 hours.  Lab Results  Component Value Date   HGBA1C  12/15/2008    5.3 (NOTE) The ADA recommends the following therapeutic goal for glycemic control related to Hgb A1c measurement: Goal of therapy: <6.5 Hgb A1c  Reference: American Diabetes Association: Clinical Practice Recommendations 2010, Diabetes Care, 2010, 33: (Suppl  1).   ------------------------------------------------------------------------------------------------------------------ No results for input(s): TSH, T4TOTAL, T3FREE, THYROIDAB in the last 72 hours.  Invalid input(s): FREET3 ------------------------------------------------------------------------------------------------------------------ No results for input(s): VITAMINB12, FOLATE, FERRITIN, TIBC, IRON, RETICCTPCT in the last 72 hours.  Coagulation profile No results for input(s): INR, PROTIME in the last 168 hours.  No results for input(s): DDIMER in the last 72 hours.  Cardiac Enzymes No results for input(s): CKMB, TROPONINI, MYOGLOBIN in the last 168 hours.  Invalid input(s): CK ------------------------------------------------------------------------------------------------------------------    Component Value Date/Time   BNP 175.0 (H) 01/14/2017 1058    Micro Results Recent Results (from the past 240 hour(s))  Novel Coronavirus, NAA (hospital order; send-out to ref lab)     Status: None   Collection Time: 06/27/18  7:38 AM  Result Value Ref Range Status   SARS-CoV-2, NAA NOT DETECTED NOT DETECTED Final    Comment: (NOTE) This test was developed and its performance characteristics determined by [employer]. This test has not been FDA cleared or approved. This test has been authorized by FDA under an Emergency Use Authorization (EUA). This  test is only authorized for the duration of time the declaration that circumstances exist justifying the authorization of the emergency use of in vitro diagnostic tests for detection of SARS-CoV-2 virus and/or diagnosis of COVID-19 infection under section 564(b)(1) of the Act, 21 U.S.C. 937TKW-4(O)(9), unless the authorization is terminated or revoked sooner. When diagnostic testing is negative, the possibility of a false negative result should be considered in the context of a patient's recent exposures and the presence of clinical signs and symptoms consistent with COVID-19. An individual without symptoms of COVID-19 and who is not shedding SARS-CoV-2 virus would expect to have a negative (not detected) result in this assay. Performed  At: [facility] [place], [place] [id number] Rush Farmer MD QA:[id number]    [place]  Final    Comment: Performed at [facility] [address], [place], [place] [address]    Radiology Reports Ct Head Wo Contrast  Result Date: 06/19/2018 CLINICAL DATA:  85 year old male with shortness of breath weakness, altered mental status. Fever of 101.4 F. EXAM: CT HEAD WITHOUT CONTRAST TECHNIQUE: Contiguous axial images were obtained from the base of the skull through the vertex without intravenous contrast. COMPARISON:  Head CT 01/14/2017, MRI 12/26/2016 and earlier. FINDINGS: Brain: Stable cerebral volume. Stable ventricle size and configuration. No midline shift, mass effect, or evidence of intracranial mass lesion. No acute intracranial hemorrhage identified. Stable gray-white matter differentiation throughout the brain. No cortically based acute infarct identified. Vascular: Calcified atherosclerosis at the skull base. No suspicious intracranial vascular hyperdensity. Skull: No acute osseous abnormality identified. Sinuses/Orbits: Visualized paranasal sinuses and mastoids are stable and well  pneumatized. Other: Stable orbit and scalp soft tissues.  IMPRESSION: Stable non contrast CT appearance of the brain since 2018. No acute intracranial abnormality. Electronically Signed   By: Genevie Ann M.D.   On: 06/19/2018 00:24   Dg Chest Portable 1 View  Result Date: 06/18/2018 CLINICAL DATA:  85 year old male with recent pneumonia. Fever and shortness of breath. COVID-19 status is pending. EXAM: PORTABLE CHEST 1 VIEW COMPARISON:  01/14/2017 and earlier. FINDINGS: Portable AP semi upright view at 2042 hours. Ill-defined abnormal right upper lobe opacity near the hilum is new since 2018. there is also patchy confluent opacity at the right lateral costophrenic angle. There is mild superimposed asymmetric indistinct and interstitial opacity in both lungs. Chronic prosthetic cardiac valve. Stable cardiac size and mediastinal contours. Calcified aortic atherosclerosis. Visualized tracheal air column is within normal limits. No pneumothorax or pleural effusion. Negative visible bowel gas pattern. No acute osseous abnormality identified. IMPRESSION: Abnormal right greater than left pulmonary opacity most confluent in the right upper lobe opacity. Consider acute pneumonia versus viral/atypical respiratory infection. No pleural effusion. Electronically Signed   By: Genevie Ann M.D.   On: 06/18/2018 21:43

## 2018-07-02 NOTE — Progress Notes (Signed)
Spoke with legal guardian regarding updates on patient.

## 2018-07-03 LAB — GLUCOSE, CAPILLARY
Glucose-Capillary: 95 mg/dL (ref 70–99)
Glucose-Capillary: 126 mg/dL — ABNORMAL HIGH (ref 70–99)
Glucose-Capillary: 98 mg/dL (ref 70–99)

## 2018-07-03 LAB — CREATININE, SERUM
Creatinine, Ser: 1.13 mg/dL (ref 0.61–1.24)
GFR calc Af Amer: >60 mL/min (ref >60)
GFR calc non Af Amer: 59 mL/min — ABNORMAL LOW (ref >60)

## 2018-07-03 NOTE — Progress Notes (Signed)
Occupational Therapy Treatment Patient Details Name: Marvin Wagner MRN: [id number] DOB: 1931-01-01 Today's Date: 07/03/2018    History of present illness 85 y.o. male admitted 06/19/18 due to fever and confusion.  Pt dx with COVID after second test (initial test was negative) came back positive. Pt also with LFT elevation, AKI, hypernatremia, hypokalemia, acute metabolic encephalopathy.  Pt with other significant PMH of HTN, CAD, aortic stenosis, dementia, and aortic valve replacement.     OT comments  Pt progressing towards established OT goals. Pt performing short distance mobility to recliner with Min A for balance and RW management. Optimized breakfast set up on table to decrease visual clutter and increase contrasting colors. Pt performing self feeding tasks once up in recliner with Mod cues for redirections and attention. Continue to recommend dc to SNF and will continue to follow acutely as admitted.    Follow Up Recommendations  SNF;Supervision/Assistance - 24 hour    Equipment Recommendations  Other (comment)(defer to next venue)    Recommendations for Other Services PT consult    Precautions / Restrictions Precautions Precautions: Fall Restrictions Weight Bearing Restrictions: No       Mobility Bed Mobility Overal bed mobility: Needs Assistance Bed Mobility: Supine to Sit     Supine to sit: Min assist;HOB elevated     General bed mobility comments: Min A to elevate trunk  Transfers Overall transfer level: Needs assistance Equipment used: Rolling walker (2 wheeled) Transfers: Sit to/from [facility] Sit to Stand: Min assist Stand pivot transfers: Min assist       General transfer comment: Min A to power up into standing and then manage the RW for short distance mobility to recliner. Min A for safe guideance to sit in recliner    Balance Overall balance assessment: Needs assistance Sitting-balance support: Feet supported;Bilateral upper  extremity supported Sitting balance-Leahy Scale: Fair     Standing balance support: Bilateral upper extremity supported Standing balance-Leahy Scale: Poor Standing balance comment: Min A for maintaining balance with support at UE on RW                           ADL either performed or assessed with clinical judgement   ADL Overall ADL's : Needs assistance/impaired Eating/Feeding: Minimal assistance;Sitting Eating/Feeding Details (indicate cue type and reason): Pt participating in self feeding while sitting in recliner. Pt requiring Mod cues for redirection back to food. Optimizing set up to reduce visual clutter and increase color contrast.  Grooming: Maximal assistance;Wash/dry face;Sitting;Bed level Grooming Details (indicate cue type and reason): requires max hand over hand assist to initiate washing face, once initiated pt able to carry through with task                 Toilet Transfer: Minimal assistance;Ambulation;RW(simulated to recliner) Toilet Transfer Details (indicate cue type and reason): Min A for power up into standing and then short distance to mobilize to recliner.          Functional mobility during ADLs: Rolling walker;Minimal assistance(short distance) General ADL Comments: Pt performing short distance mobility showing more balance and activity tolerance. Pt participating in self feeding and grooming     Vision       Perception     Praxis      Cognition Arousal/Alertness: Awake/alert Behavior During Therapy: WFL for tasks assessed/performed Overall Cognitive Status: History of cognitive impairments - at baseline  General Comments: Increased arousal and following commands with cues for attention adn redirection        Exercises     Shoulder Instructions       General Comments VSS.     Pertinent Vitals/ Pain       Pain Assessment: Faces Faces Pain Scale: No hurt Pain  Intervention(s): Monitored during session  Home Living                                          Prior Functioning/Environment              Frequency  Min 2X/week        Progress Toward Goals  OT Goals(current goals can now be found in the care plan section)  Progress towards OT goals: Progressing toward goals  Acute Rehab OT Goals Patient Stated Goal: unable to state OT Goal Formulation: Patient unable to participate in goal setting Time For Goal Achievement: 07/08/18 Potential to Achieve Goals: Good ADL Goals Pt Will Perform Grooming: with min assist;sitting Pt Will Transfer to Toilet: with min assist;stand pivot transfer;bedside commode Additional ADL Goal #1: Pt will perform bed mobility with Min A in preparation for ADLs Additional ADL Goal #2: Pt will sustain attention to simple ADLs with Min cues  Plan Discharge plan remains appropriate    Co-evaluation                 AM-PAC OT "6 Clicks" Daily Activity     Outcome Measure   Help from another person eating meals?: A Little Help from another person taking care of personal grooming?: A Lot Help from another person toileting, which includes using toliet, bedpan, or urinal?: A Lot Help from another person bathing (including washing, rinsing, drying)?: A Lot Help from another person to put on and taking off regular upper body clothing?: A Lot Help from another person to put on and taking off regular lower body clothing?: Total 6 Click Score: 12    End of Session Equipment Utilized During Treatment: Rolling walker  OT Visit Diagnosis: Unsteadiness on feet (R26.81);Other abnormalities of gait and mobility (R26.89);Muscle weakness (generalized) (M62.81);Other symptoms and signs involving cognitive function   Activity Tolerance Patient tolerated treatment well   Patient Left with call bell/phone within reach;in chair;with chair alarm set   Nurse Communication Mobility status         Time: 6063-0160 OT Time Calculation (min): 42 min  Charges: OT General Charges $OT Visit: 1 Visit OT Treatments $Self Care/Home Management : 38-52 mins  [place], OTR/L Acute Rehab Pager: [phone] Office: [place] 07/03/2018, 2:57 PM

## 2018-07-03 NOTE — Progress Notes (Signed)
Tried to call Jari Favre, patient's legal guardian, at [phone] to give update. There was no answer and unable to leave message due to mailbox being full.

## 2018-07-03 NOTE — Progress Notes (Signed)
Physical Therapy Treatment Patient Details Name: Marvin Wagner MRN: [id number] DOB: 04-Oct-1930 Today's Date: 07/03/2018    History of Present Illness 85 y.o. male admitted 06/19/18 due to fever and confusion.  Pt dx with COVID after second test (initial test was negative) came back positive. Pt also with LFT elevation, AKI, hypernatremia, hypokalemia, acute metabolic encephalopathy.  Pt with other significant PMH of HTN, CAD, aortic stenosis, dementia, and aortic valve replacement.      PT Comments    Pt was able to stand after a few hours in the recliner chair with one person mod assist to the RW and take pivotal steps back to bed.  We stood 4 times total for peri care and repeat standing practice. Pt continues to enjoy music in his room.  PT will continue to follow acutely for safe mobility progression   Follow Up Recommendations  SNF     Equipment Recommendations  Wheelchair (measurements PT);Wheelchair cushion (measurements PT);Hospital bed    Recommendations for Other Services   NA     Precautions / Restrictions Precautions Precautions: Fall Restrictions Weight Bearing Restrictions: No    Mobility  Bed Mobility Overal bed mobility: Needs Assistance Bed Mobility: Sit to Supine     Supine to sit: Min assist;HOB elevated Sit to supine: Mod assist   General bed mobility comments: Mod assist to help legs back into bed, significant multimodal cues to get pt to realize that I wanted him to lay down in the bed.   Transfers Overall transfer level: Needs assistance Equipment used: Rolling walker (2 wheeled) Transfers: Sit to/from [facility] Sit to Stand: Mod assist Stand pivot transfers: Mod assist       General transfer comment: One person mod assist after being up in the chair for several hours today.  We stood multiple times from recliner chair and bed for peri care to RW, pivotal step to bed with posterior preference throughout.          Balance  Overall balance assessment: Needs assistance Sitting-balance support: Feet supported;No upper extremity supported Sitting balance-Leahy Scale: Good Sitting balance - Comments: supervision EOB, reaching to the floor for bits of paper Postural control: Posterior lean Standing balance support: Bilateral upper extremity supported Standing balance-Leahy Scale: Poor Standing balance comment: needs external assist and RW for standing. Posterior preference throughout.                             Cognition Arousal/Alertness: Awake/alert Behavior During Therapy: Restless Overall Cognitive Status: History of cognitive impairments - at baseline                                 General Comments: Increased arousal and following commands with cues for attention adn redirection      Exercises Other Exercises Other Exercises: "danced" flexing at elbows and raising shoulders to music.      General Comments General comments (skin integrity, edema, etc.): VSS on RA.  Pt was not in any distress.  Kingsley Spittle playing throghout session.  Pt smiling and moving things around on his trey table.      Pertinent Vitals/Pain Pain Assessment: Faces Faces Pain Scale: No hurt Pain Intervention(s): Monitored during session           PT Goals (current goals can now be found in the care plan section) Acute Rehab PT Goals Patient Stated Goal: unable  to state Progress towards PT goals: Progressing toward goals    Frequency    Min 3X/week      PT Plan Current plan remains appropriate       AM-PAC PT "6 Clicks" Mobility   Outcome Measure  Help needed turning from your back to your side while in a flat bed without using bedrails?: A Little Help needed moving from lying on your back to sitting on the side of a flat bed without using bedrails?: A Lot Help needed moving to and from a bed to a chair (including a wheelchair)?: A Lot Help needed standing up from a chair using  your arms (e.g., wheelchair or bedside chair)?: A Lot Help needed to walk in hospital room?: Total Help needed climbing 3-5 steps with a railing? : Total 6 Click Score: 11    End of Session Equipment Utilized During Treatment: Gait belt Activity Tolerance: Patient tolerated treatment well Patient left: in bed;with call bell/phone within reach;with bed alarm set   PT Visit Diagnosis: Muscle weakness (generalized) (M62.81);Difficulty in walking, not elsewhere classified (R26.2)     Time: 0017-4944 PT Time Calculation (min) (ACUTE ONLY): 24 min  Charges:  $Therapeutic Activity: 23-37 mins           Richardo Popoff B. Kazuto Sevey, PT, DPT  Acute Rehabilitation [phone] pager #[phone] office            07/03/2018, 3:32 PM

## 2018-07-03 NOTE — Progress Notes (Signed)
PROGRESS NOTE                                                                                                                                                                                                             Patient Demographics:    Marvin Wagner, is a 85 y.o. male, DOB - 07-19-1930, MWU:[id number]  Outpatient Primary MD for the patient is Burchette, Alinda Sierras, MD    LOS - 14  Chief Complaint  Patient presents with  . Shortness of Breath       Brief Narrative: Patient is a 85 y.o. male with PMHx of dementia, AFib, HTN, and CAD who presented with confusion and fever found to have covid-19 viral pneumonia.  Markedly better with 1 dose of Actemra and steroids.  He is now on room air-remains pleasantly confused and at baseline-currently awaiting disposition to SNF.   Subjective:    Marvin Wagner mains remains pleasantly confused.  No major issues overnight.  Moving all 4 extremities.    Assessment  & Plan :   COVID-19 viral pneumonia: Improved-patient is s/p Actemra and steroids.  Inflammatory markers have trended down, patient is on room air.   -She has had negative test on 5/27, 5/29, discussed with social worker, hopefully will go to [facility] .  COVID-19 Labs:  No results for input(s): DDIMER, FERRITIN, LDH, CRP in the last 72 hours.  Lab Results  Component Value Date   SARSCOV2NAA NOT DETECTED 06/29/2018   SARSCOV2NAA NOT DETECTED 06/27/2018   SARSCOV2NAA POSITIVE (A) 06/19/2018   [place] NEGATIVE 06/18/2018     COVID-19 Medications: 5/20>> Actemra 5/19>> 5/22>> Solu-Medrol  Transaminitis: Probably secondary to COVID-19 or from Actemra-continuous to slowly downtrend.  Follow periodically.   AKI: Resolved-likely hemodynamically mediated  Hypernatremia: Improved-monitor periodically.  Acute metabolic encephalopathy: Secondary to COVID-19 infection-improved with supportive care.  Has  dementia at baseline-suspect back to usual baseline.  Chronic atrial fibrillation: Rate controlled with metoprolol.  Not a candidate for anticoagulation.  Remains on aspirin  CAD: No anginal symptoms-continue aspirin/metoprolol and statin  Dementia with delirium: Pleasantly confused-suspect not far from baseline-continue Aricept/Namenda and Risperdal.    Dysphagia: Probably related to dementia-evaluated by SLP-continue dysphagia 3 diet    ABG:    Component Value Date/Time   PHART 7.371 12/17/2008 2024   PCO2ART 42.2 12/17/2008 2024  PO2ART 162.0 (H) 12/17/2008 2024   HCO3 24.2 (H) 12/17/2008 2024   TCO2 22 12/17/2008 2028   ACIDBASEDEF 1.0 12/17/2008 2024   O2SAT 99.0 12/17/2008 2024    Condition - stable  Family Communication  : Spoke with patient's POA over the phone this morning.  Code Status :  DNR  Diet : Dysphagia 3 diet  Disposition Plan  :  [facility] in am.  Consults  :  None  Procedures  :  None  DVT Prophylaxis  :  Lovenox   Lab Results  Component Value Date   PLT 214 06/25/2018    Inpatient Medications  Scheduled Meds: . aspirin EC  81 mg Oral Daily  . atorvastatin  40 mg Oral Daily  . donepezil  10 mg Oral QHS  . enoxaparin (LOVENOX) injection  40 mg Subcutaneous Q24H  . mouth rinse  15 mL Mouth Rinse BID  . Melatonin  1 mg Oral QHS  . memantine  10 mg Oral BID  . metoprolol tartrate  12.5 mg Oral Daily   Continuous Infusions: PRN Meds:.acetaminophen **OR** acetaminophen, lip balm, LORazepam, ondansetron **OR** ondansetron (ZOFRAN) IV, risperiDONE  Antibiotics  :    Anti-infectives (From admission, onward)   Start     Dose/Rate Route Frequency Ordered Stop   06/19/18 2000  cefTRIAXone (ROCEPHIN) 1 g in sodium chloride 0.9 % 100 mL IVPB  Status:  Discontinued     1 g 200 mL/hr over 30 Minutes Intravenous Every 24 hours 06/19/18 0014 06/20/18 1811   06/19/18 2000  azithromycin (ZITHROMAX) 500 mg in sodium chloride 0.9 % 250 mL IVPB   Status:  Discontinued     500 mg 250 mL/hr over 60 Minutes Intravenous Every 24 hours 06/19/18 0014 06/20/18 1811   06/18/18 2215  cefTRIAXone (ROCEPHIN) 1 g in sodium chloride 0.9 % 100 mL IVPB     1 g 200 mL/hr over 30 Minutes Intravenous  Once 06/18/18 2209 06/19/18 0006   06/18/18 2215  azithromycin (ZITHROMAX) 500 mg in sodium chloride 0.9 % 250 mL IVPB     500 mg 250 mL/hr over 60 Minutes Intravenous  Once 06/18/18 2209 06/19/18 0006       14    Objective:   Vitals:   07/02/18 1600 07/02/18 2000 07/03/18 0338 07/03/18 1208  BP: 113/85 121/78  112/82  Pulse: 85 70 76   Resp:    18  Temp:  97.9 F (36.6 C) 98 F (36.7 C) 97.7 F (36.5 C)  TempSrc:  Oral Oral Oral  SpO2: 97% 96% 99%   Weight:      Height:        Wt Readings from Last 3 Encounters:  06/18/18 81 kg  09/21/17 79.4 kg  02/08/17 77.9 kg    No intake or output data in the 24 hours ending 07/03/18 1418   Physical Exam  Awake Alert, pleasantly demented, frail, sitting in recliner eating breakfast with nurse assistant. Symmetrical Chest wall movement, Good air movement bilaterally, CTAB RRR,No Gallops,Rubs or new Murmurs, No Parasternal Heave +ve B.Sounds, Abd Soft, No tenderness, No rebound - guarding or rigidity. No Cyanosis, Clubbing or edema, No new Rash or bruise       Data Review:    CBC No results for input(s): WBC, HGB, HCT, PLT, MCV, MCH, MCHC, RDW, LYMPHSABS, MONOABS, EOSABS, BASOSABS, BANDABS in the last 168 hours.  Invalid input(s): NEUTRABS, BANDSABD  Chemistries  Recent Labs  Lab 07/03/18 0435  CREATININE 1.13   ------------------------------------------------------------------------------------------------------------------  No results for input(s): CHOL, HDL, LDLCALC, TRIG, CHOLHDL, LDLDIRECT in the last 72 hours.  Lab Results  Component Value Date   HGBA1C  12/15/2008    5.3 (NOTE) The ADA recommends the following therapeutic goal for glycemic control related to Hgb A1c  measurement: Goal of therapy: <6.5 Hgb A1c  Reference: American Diabetes Association: Clinical Practice Recommendations 2010, Diabetes Care, 2010, 33: (Suppl  1).   ------------------------------------------------------------------------------------------------------------------ No results for input(s): TSH, T4TOTAL, T3FREE, THYROIDAB in the last 72 hours.  Invalid input(s): FREET3 ------------------------------------------------------------------------------------------------------------------ No results for input(s): VITAMINB12, FOLATE, FERRITIN, TIBC, IRON, RETICCTPCT in the last 72 hours.  Coagulation profile No results for input(s): INR, PROTIME in the last 168 hours.  No results for input(s): DDIMER in the last 72 hours.  Cardiac Enzymes No results for input(s): CKMB, TROPONINI, MYOGLOBIN in the last 168 hours.  Invalid input(s): CK ------------------------------------------------------------------------------------------------------------------    Component Value Date/Time   BNP 175.0 (H) 01/14/2017 1058    Micro Results Recent Results (from the past 240 hour(s))  Novel Coronavirus, NAA (hospital order; send-out to ref lab)     Status: None   Collection Time: 06/27/18  7:38 AM  Result Value Ref Range Status   SARS-CoV-2, NAA NOT DETECTED NOT DETECTED Final    Comment: (NOTE) This test was developed and its performance characteristics determined by [employer]. This test has not been FDA cleared or approved. This test has been authorized by FDA under an Emergency Use Authorization (EUA). This test is only authorized for the duration of time the declaration that circumstances exist justifying the authorization of the emergency use of in vitro diagnostic tests for detection of SARS-CoV-2 virus and/or diagnosis of COVID-19 infection under section 564(b)(1) of the Act, 21 U.S.C. 563OVF-6(E)(3), unless the authorization is terminated or revoked sooner. When  diagnostic testing is negative, the possibility of a false negative result should be considered in the context of a patient's recent exposures and the presence of clinical signs and symptoms consistent with COVID-19. An individual without symptoms of COVID-19 and who is not shedding SARS-CoV-2 virus would expect to have a negative (not detected) result in this assay. Performed  At: [facility] [place], [place] [id number] Rush Farmer MD YS:[id number]    [place]  Final    Comment: Performed at [facility] [address], [place], [place] [address]  Novel Coronavirus, NAA (hospital order; send-out to ref lab)     Status: None   Collection Time: 06/29/18 12:35 PM  Result Value Ref Range Status   SARS-CoV-2, NAA NOT DETECTED NOT DETECTED Final    Comment: (NOTE) This test was developed and its performance characteristics determined by [employer]. This test has not been FDA cleared or approved. This test has been authorized by FDA under an Emergency Use Authorization (EUA). This test is only authorized for the duration of time the declaration that circumstances exist justifying the authorization of the emergency use of in vitro diagnostic tests for detection of SARS-CoV-2 virus and/or diagnosis of COVID-19 infection under section 564(b)(1) of the Act, 21 U.S.C. 732KGU-5(K)(2), unless the authorization is terminated or revoked sooner. When diagnostic testing is negative, the possibility of a false negative result should be considered in the context of a patient's recent exposures and the presence of clinical signs and symptoms consistent with COVID-19. An individual without symptoms of COVID-19 and who is not shedding SARS-CoV-2 virus would expect to have a negative (not detected) result in this assay. Performed  At: [facility] [place], [place] [id number] Rush Farmer MD  BV:[id number]    Coronavirus Source NASOPHARYNGEAL  Final    Comment: Performed at [facility] [address] [place], [place] [address]    Radiology Reports Ct Head Wo Contrast  Result Date: 06/19/2018 CLINICAL DATA:  85 year old male with shortness of breath weakness, altered mental status. Fever of 101.4 F. EXAM: CT HEAD WITHOUT CONTRAST TECHNIQUE: Contiguous axial images were obtained from the base of the skull through the vertex without intravenous contrast. COMPARISON:  Head CT 01/14/2017, MRI 12/26/2016 and earlier. FINDINGS: Brain: Stable cerebral volume. Stable ventricle size and configuration. No midline shift, mass effect, or evidence of intracranial mass lesion. No acute intracranial hemorrhage identified. Stable gray-white matter differentiation throughout the brain. No cortically based acute infarct identified. Vascular: Calcified atherosclerosis at the skull base. No suspicious intracranial vascular hyperdensity. Skull: No acute osseous abnormality identified. Sinuses/Orbits: Visualized paranasal sinuses and mastoids are stable and well pneumatized. Other: Stable orbit and scalp soft tissues. IMPRESSION: Stable non contrast CT appearance of the brain since 2018. No acute intracranial abnormality. Electronically Signed   By: Genevie Ann M.D.   On: 06/19/2018 00:24   Dg Chest Portable 1 View  Result Date: 06/18/2018 CLINICAL DATA:  84 year old male with recent pneumonia. Fever and shortness of breath. COVID-19 status is pending. EXAM: PORTABLE CHEST 1 VIEW COMPARISON:  01/14/2017 and earlier. FINDINGS: Portable AP semi upright view at 2042 hours. Ill-defined abnormal right upper lobe opacity near the hilum is new since 2018. there is also patchy confluent opacity at the right lateral costophrenic angle. There is mild superimposed asymmetric indistinct and interstitial opacity in both lungs. Chronic prosthetic cardiac valve. Stable cardiac size and mediastinal contours.  Calcified aortic atherosclerosis. Visualized tracheal air column is within normal limits. No pneumothorax or pleural effusion. Negative visible bowel gas pattern. No acute osseous abnormality identified. IMPRESSION: Abnormal right greater than left pulmonary opacity most confluent in the right upper lobe opacity. Consider acute pneumonia versus viral/atypical respiratory infection. No pleural effusion. Electronically Signed   By: Genevie Ann M.D.   On: 06/18/2018 21:43   Time Spent in minutes  15   Phillips Climes M.D on 07/03/2018 at 2:18 PM  To page go to [email or web address] - use universal password  [facility] -  Office  [phone]   See all Orders from today for further details   Admit date - 06/18/2018

## 2018-07-03 NOTE — TOC Progression Note (Signed)
Transition of Care [facility]) - Progression Note    Patient Details  Name: Marvin Wagner MRN: [id number] Date of Birth: 10/01/1930  Transition of Care [facility]) CM/SW [place], [place] Phone Number: 07/03/2018, 11:47 AM  Clinical Narrative:     CSW spoke with Olivia Mackie, with [facility], facility is able to accept patient Wednesday 6/3. MD and legal guardian, Marvin Wagner, notified. Legal guardian voiced appreciation for patient going to [facility] and excitement for patient being discharged.   Will follow up on 6/3 for discharge plans.   Expected Discharge Plan: Skilled Nursing Facility Barriers to Discharge: Other (comment)(from Memeory Cre at [facility])  Expected Discharge Plan and Services Expected Discharge Plan: [facility] arrangements for the past 2 months: [facility] Expected Discharge Date: 06/29/18                                     Social Determinants of Health (SDOH) Interventions    Readmission Risk Interventions No flowsheet data found.

## 2018-07-04 LAB — GLUCOSE, CAPILLARY
Glucose-Capillary: 103 mg/dL — ABNORMAL HIGH (ref 70–99)
Glucose-Capillary: 81 mg/dL (ref 70–99)
Glucose-Capillary: 93 mg/dL (ref 70–99)
Glucose-Capillary: 96 mg/dL (ref 70–99)

## 2018-07-04 NOTE — Progress Notes (Signed)
Marvin Wagner place as rescinded bed offer due to guardian Marvin Wagner insisting patient needs 1:1 care. No SNF allows private hired help into their facilities at this time. Marvin Wagner continues to be persistent with this request.   Previously, bed offers from [facility] were also rescinded due to Gila persistence that staff needs to be 1:1 with patient. CSW has attempted multiple times to educate guardian on SNF's inability to have private 1:1 sitters in facility and that patient does not require that at this time.   Will continue to work on bed offers, however guardian continues to pose barriers to discharge ultimately resulting in SNFs rescinding their bed offers.  [place], [place]

## 2018-07-04 NOTE — Progress Notes (Signed)
Tried to call Aldean Jewett, patient's legal guardian, at [phone] to give update. Left message.

## 2018-07-04 NOTE — Progress Notes (Signed)
CSW called lvm for [name], director [employer] assisting in attempting placement.   [place], [place]

## 2018-07-04 NOTE — Progress Notes (Signed)
PROGRESS NOTE                                                                                                                                                                                                             Patient Demographics:    Marvin Wagner, is a 85 y.o. male, DOB - [birth year], UVO:[id number]  Outpatient Primary MD for the patient is Burchette, Alinda Sierras, MD    LOS - 15  Chief Complaint  Patient presents with  . Shortness of Breath       Brief Narrative: Patient is a 85 y.o. male with PMHx of dementia, AFib, HTN, and CAD who presented with confusion and fever found to have covid-19 viral pneumonia.  Markedly better with 1 dose of Actemra and steroids.  He is now on room air-remains pleasantly confused and at baseline-currently awaiting disposition to SNF.   Subjective:    Marvin Wagner mains remains with  No major issues overnight.  Moving all 4 extremities.    Assessment  & Plan :   COVID-19 viral pneumonia: Improved-patient is s/p Actemra and steroids.  Inflammatory markers have trended down, patient is on room air.   -She had negative test x2 on 5/27, 5/29, waiting SNF bed offer for discharge  COVID-19 Labs:  No results for input(s): DDIMER, FERRITIN, LDH, CRP in the last 72 hours.  Lab Results  Component Value Date   SARSCOV2NAA NOT DETECTED 06/29/2018   SARSCOV2NAA NOT DETECTED 06/27/2018   SARSCOV2NAA POSITIVE (A) 06/19/2018   [place] NEGATIVE 06/18/2018     COVID-19 Medications: 5/20>> Actemra 5/19>> 5/22>> Solu-Medrol  Transaminitis: Probably secondary to COVID-19 or from Actemra-continuous to slowly downtrend.  Follow periodically.   AKI: Resolved-likely hemodynamically mediated  Hypernatremia: Improved-monitor periodically.  Acute metabolic encephalopathy: Secondary to COVID-19 infection-improved with supportive care.  Has dementia at baseline-suspect back to usual baseline.   Chronic atrial fibrillation: Rate controlled with metoprolol.  Not a candidate for anticoagulation.  Remains on aspirin  CAD: No anginal symptoms-continue aspirin/metoprolol and statin  Dementia with delirium: Pleasantly confused-suspect not far from baseline-continue Aricept/Namenda and Risperdal.    Dysphagia: Probably related to dementia-evaluated by SLP-continue dysphagia 3 diet    ABG:    Component Value Date/Time   PHART 7.371 12/17/2008 2024   PCO2ART 42.2 12/17/2008 2024   PO2ART 162.0 (H) 12/17/2008 2024  HCO3 24.2 (H) 12/17/2008 2024   TCO2 22 12/17/2008 2028   ACIDBASEDEF 1.0 12/17/2008 2024   O2SAT 99.0 12/17/2008 2024    Condition - stable  Family Communication  : Spoke with patient's POA over the phone 6/2  Code Status :  DNR  Diet : Dysphagia 3 diet  Disposition Plan  : SNF when bed available  Consults  :  None  Procedures  :  None  DVT Prophylaxis  :  Lovenox   Lab Results  Component Value Date   PLT 214 06/25/2018    Inpatient Medications  Scheduled Meds: . aspirin EC  81 mg Oral Daily  . atorvastatin  40 mg Oral Daily  . donepezil  10 mg Oral QHS  . enoxaparin (LOVENOX) injection  40 mg Subcutaneous Q24H  . mouth rinse  15 mL Mouth Rinse BID  . Melatonin  1 mg Oral QHS  . memantine  10 mg Oral BID  . metoprolol tartrate  12.5 mg Oral Daily   Continuous Infusions: PRN Meds:.acetaminophen **OR** acetaminophen, lip balm, LORazepam, ondansetron **OR** ondansetron (ZOFRAN) IV, risperiDONE  Antibiotics  :    Anti-infectives (From admission, onward)   Start     Dose/Rate Route Frequency Ordered Stop   06/19/18 2000  cefTRIAXone (ROCEPHIN) 1 g in sodium chloride 0.9 % 100 mL IVPB  Status:  Discontinued     1 g 200 mL/hr over 30 Minutes Intravenous Every 24 hours 06/19/18 0014 06/20/18 1811   06/19/18 2000  azithromycin (ZITHROMAX) 500 mg in sodium chloride 0.9 % 250 mL IVPB  Status:  Discontinued     500 mg 250 mL/hr over 60 Minutes  Intravenous Every 24 hours 06/19/18 0014 06/20/18 1811   06/18/18 2215  cefTRIAXone (ROCEPHIN) 1 g in sodium chloride 0.9 % 100 mL IVPB     1 g 200 mL/hr over 30 Minutes Intravenous  Once 06/18/18 2209 06/19/18 0006   06/18/18 2215  azithromycin (ZITHROMAX) 500 mg in sodium chloride 0.9 % 250 mL IVPB     500 mg 250 mL/hr over 60 Minutes Intravenous  Once 06/18/18 2209 06/19/18 0006       15    Objective:   Vitals:   07/04/18 0400 07/04/18 0743 07/04/18 1045 07/04/18 1200  BP: 102/61 129/63 107/65 108/63  Pulse: 70 74 83 75  Resp:  19  20  Temp:  (!) 97.5 F (36.4 C)  (!) 97.4 F (36.3 C)  TempSrc:  Oral  Oral  SpO2: 95% 97%  95%  Weight:      Height:        Wt Readings from Last 3 Encounters:  06/18/18 81 kg  09/21/17 79.4 kg  02/08/17 77.9 kg     Intake/Output Summary (Last 24 hours) at 07/04/2018 1554 Last data filed at 07/04/2018 1050 Gross per 24 hour  Intake 300 ml  Output 325 ml  Net -25 ml     Physical Exam  Awake , frail, demented, pleasant  symmetrical Chest wall movement, Good air movement bilaterally, CTAB RRR,No Gallops,Rubs or new Murmurs, No Parasternal Heave +ve B.Sounds, Abd Soft, No tenderness, No rebound - guarding or rigidity. No Cyanosis, Clubbing or edema, No new Rash or bruise       Data Review:    CBC No results for input(s): WBC, HGB, HCT, PLT, MCV, MCH, MCHC, RDW, LYMPHSABS, MONOABS, EOSABS, BASOSABS, BANDABS in the last 168 hours.  Invalid input(s): NEUTRABS, [place]  Recent Labs  Lab 07/03/18 0435  CREATININE  1.13   ------------------------------------------------------------------------------------------------------------------ No results for input(s): CHOL, HDL, LDLCALC, TRIG, CHOLHDL, LDLDIRECT in the last 72 hours.  Lab Results  Component Value Date   HGBA1C  12/15/2008    5.3 (NOTE) The ADA recommends the following therapeutic goal for glycemic control related to Hgb A1c measurement: Goal of therapy:  <6.5 Hgb A1c  Reference: American Diabetes Association: Clinical Practice Recommendations 2010, Diabetes Care, 2010, 33: (Suppl  1).   ------------------------------------------------------------------------------------------------------------------ No results for input(s): TSH, T4TOTAL, T3FREE, THYROIDAB in the last 72 hours.  Invalid input(s): FREET3 ------------------------------------------------------------------------------------------------------------------ No results for input(s): VITAMINB12, FOLATE, FERRITIN, TIBC, IRON, RETICCTPCT in the last 72 hours.  Coagulation profile No results for input(s): INR, PROTIME in the last 168 hours.  No results for input(s): DDIMER in the last 72 hours.  Cardiac Enzymes No results for input(s): CKMB, TROPONINI, MYOGLOBIN in the last 168 hours.  Invalid input(s): CK ------------------------------------------------------------------------------------------------------------------    Component Value Date/Time   BNP 175.0 (H) 01/14/2017 1058    Micro Results Recent Results (from the past 240 hour(s))  Novel Coronavirus, NAA (hospital order; send-out to ref lab)     Status: None   Collection Time: 06/27/18  7:38 AM  Result Value Ref Range Status   SARS-CoV-2, NAA NOT DETECTED NOT DETECTED Final    Comment: (NOTE) This test was developed and its performance characteristics determined by [employer]. This test has not been FDA cleared or approved. This test has been authorized by FDA under an Emergency Use Authorization (EUA). This test is only authorized for the duration of time the declaration that circumstances exist justifying the authorization of the emergency use of in vitro diagnostic tests for detection of SARS-CoV-2 virus and/or diagnosis of COVID-19 infection under section 564(b)(1) of the Act, 21 U.S.C. 341PFX-9(K)(2), unless the authorization is terminated or revoked sooner. When diagnostic testing is negative, the  possibility of a false negative result should be considered in the context of a patient's recent exposures and the presence of clinical signs and symptoms consistent with COVID-19. An individual without symptoms of COVID-19 and who is not shedding SARS-CoV-2 virus would expect to have a negative (not detected) result in this assay. Performed  At: [facility] [place], [place] [id number] Rush Farmer MD JM:[id number]    [place]  Final    Comment: Performed at [facility] [address], [place], [place] [address]  Novel Coronavirus, NAA (hospital order; send-out to ref lab)     Status: None   Collection Time: 06/29/18 12:35 PM  Result Value Ref Range Status   SARS-CoV-2, NAA NOT DETECTED NOT DETECTED Final    Comment: (NOTE) This test was developed and its performance characteristics determined by [employer]. This test has not been FDA cleared or approved. This test has been authorized by FDA under an Emergency Use Authorization (EUA). This test is only authorized for the duration of time the declaration that circumstances exist justifying the authorization of the emergency use of in vitro diagnostic tests for detection of SARS-CoV-2 virus and/or diagnosis of COVID-19 infection under section 564(b)(1) of the Act, 21 U.S.C. 892JJH-4(R)(7), unless the authorization is terminated or revoked sooner. When diagnostic testing is negative, the possibility of a false negative result should be considered in the context of a patient's recent exposures and the presence of clinical signs and symptoms consistent with COVID-19. An individual without symptoms of COVID-19 and who is not shedding SARS-CoV-2 virus would expect to have a negative (not detected) result  in this assay. Performed  At: [facility] [place], [place] [id number] Rush Farmer MD [id number]    [place]  Final    Comment: Performed at [facility] [address] [place], [place] [address]    Radiology Reports Ct Head Wo Contrast  Result Date: 06/19/2018 CLINICAL DATA:  85 year old male with shortness of breath weakness, altered mental status. Fever of 101.4 F. EXAM: CT HEAD WITHOUT CONTRAST TECHNIQUE: Contiguous axial images were obtained from the base of the skull through the vertex without intravenous contrast. COMPARISON:  Head CT 01/14/2017, MRI 12/26/2016 and earlier. FINDINGS: Brain: Stable cerebral volume. Stable ventricle size and configuration. No midline shift, mass effect, or evidence of intracranial mass lesion. No acute intracranial hemorrhage identified. Stable gray-white matter differentiation throughout the brain. No cortically based acute infarct identified. Vascular: Calcified atherosclerosis at the skull base. No suspicious intracranial vascular hyperdensity. Skull: No acute osseous abnormality identified. Sinuses/Orbits: Visualized paranasal sinuses and mastoids are stable and well pneumatized. Other: Stable orbit and scalp soft tissues. IMPRESSION: Stable non contrast CT appearance of the brain since 2018. No acute intracranial abnormality. Electronically Signed   By: Genevie Ann M.D.   On: 06/19/2018 00:24   Dg Chest Portable 1 View  Result Date: 06/18/2018 CLINICAL DATA:  85 year old male with recent pneumonia. Fever and shortness of breath. COVID-19 status is pending. EXAM: PORTABLE CHEST 1 VIEW COMPARISON:  01/14/2017 and earlier. FINDINGS: Portable AP semi upright view at 2042 hours. Ill-defined abnormal right upper lobe opacity near the hilum is new since 2018. there is also patchy confluent opacity at the right lateral costophrenic angle. There is mild superimposed asymmetric indistinct and interstitial opacity in both lungs. Chronic prosthetic cardiac valve. Stable cardiac size and mediastinal contours. Calcified aortic atherosclerosis.  Visualized tracheal air column is within normal limits. No pneumothorax or pleural effusion. Negative visible bowel gas pattern. No acute osseous abnormality identified. IMPRESSION: Abnormal right greater than left pulmonary opacity most confluent in the right upper lobe opacity. Consider acute pneumonia versus viral/atypical respiratory infection. No pleural effusion. Electronically Signed   By: Genevie Ann M.D.   On: 06/18/2018 21:43   Time Spent in minutes  15   Phillips Climes M.D on 07/03/2018 at 2:18 PM  To page go to [email or web address] - use universal password  [facility] -  Office  [phone]   See all Orders from today for further details   Admit date - 06/18/2018

## 2018-07-05 LAB — GLUCOSE, CAPILLARY
Glucose-Capillary: 91 mg/dL (ref 70–99)
Glucose-Capillary: 107 mg/dL — ABNORMAL HIGH (ref 70–99)

## 2018-07-05 MED ORDER — RISPERIDONE 0.25 MG PO TABS: 0.2500 mg | ORAL_TABLET | Freq: Two times a day (BID) | ORAL | 5 refills | Status: AC | PRN

## 2018-07-05 MED ORDER — ACETAMINOPHEN 325 MG PO TABS: 650.0000 mg | ORAL_TABLET | Freq: Four times a day (QID) | ORAL | Status: DC | PRN

## 2018-07-05 NOTE — Progress Notes (Signed)
CSW had a vm from patient's [place] reporting she has worked things out with [employer] regarding patient being able to return.   CSW contacted [facility] and they reported through threats Marvin Wagner has almost made them take patient back, however they are still concerned about his diet as they are not able to accommodate. [facility] is inquiring if patient's diet may improve.  CSW has paged MD awaiting response.   [place], [place]

## 2018-07-05 NOTE — Discharge Summary (Addendum)
Marvin Wagner, is a 85 y.o. male  DOB [birth year]  MRN [id number].  Admission date:  06/18/2018  Admitting Physician  Rise Patience, MD  Discharge Date:  07/05/2018   Primary MD  Eulas Post, MD   Addendum:  No significant update on discharge summary adjustment of medication detected on 07/05/2018, patient was seen and examined today, patient is stable for discharge.  Recommendations for primary care physician for things to follow:  - please check CBC, BMP in one week  Code Status: DNR  Heart Healthy Diet   Admission Diagnosis  Hypoxia [R09.02] Multifocal pneumonia [J18.9]   Discharge Diagnosis  Hypoxia [R09.02] Multifocal pneumonia [J18.9]    Principal Problem:   Community acquired pneumonia Active Problems:   Hx of CABG   Dementia ([place])   Acute encephalopathy   CAP (community acquired pneumonia)      Past Medical History:  Diagnosis Date  . Aortic insufficiency and aortic stenosis   . Aortic stenosis    SEVERE  . CAD (coronary artery disease)   . Heart murmur   . Hyperlipidemia   . Hypertension   . Mild memory disturbance     Past Surgical History:  Procedure Laterality Date  . AORTIC VALVE REPLACEMENT  12/17/08  . CARDIAC CATHETERIZATION    . CORONARY ANGIOPLASTY     NORMAL LEFT VENTRICULAR SIZEAND CONTRACTILITY WITH NORMAL SYSTOLIC FUNCTION. EF 65%  . TONSILLECTOMY         History of present illness and  Hospital Course:     Kindly see H&P for history of present illness and admission details, please review complete Labs, Consult reports and Test reports for all details in brief  HPI  from the history and physical done on the day of admission 06/18/2018  HPI: Marvin Wagner is a 85 y.o. male with history of dementia A. fib stroke hypertension and CAD was found to be increasingly confused over the last 1 week had chest x-ray done as per the report on Jun 15, 2018 was showing infiltrates but no antibiotics were started at that time since patient became more febrile and confused yesterday patient was brought to the ER.  Per daughter patient mental status is at the baseline per the discussion with the ER physician.  ED Course: In the ER patient is not oriented.  Moves all extremities CT head is unremarkable.  Chest x-ray shows infiltrates most of the right side concerning for atypical pneumonia.  COVID-19 test was negative.  Patient was febrile with temperature of 101.4 F.  Lab work show sodium of 141 creatinine 1.1 AST 76 ALT 38 lactic acid 1.1 hemoglobin 12.8 platelets 145.  Blood cultures were obtained patient was started on empiric antibiotics for pneumonia and admitted for further work-up.   Hospital Course   Patient is a 85 y.o. male with PMHx of dementia, AFib, HTN, and CAD who presented with confusion and fever found to have covid-19 viral pneumonia.  Markedly better with 1 dose of Actemra and steroids.  He  is now on room air-remains pleasantly confused and at baseline.  COVID-19 viral pneumonia: Improved-patient is s/p Actemra and steroids.  Inflammatory markers have trended down, patient is on room air.   -She had negative test x2 on 5/27, 5/29 COVID-19 Labs:   COVID-19 Medications: 5/20>> Actemra 5/19>> 5/22>> Solu-Medrol  Transaminitis: Probably secondary to COVID-19 or from Actemra-continue to slowly trending doswn.  Follow periodically.   AKI: Resolved-likely hemodynamically mediated  Hypernatremia: Improved-monitor periodically.  Acute metabolic encephalopathy: Secondary to COVID-19 infection-improved with supportive care.  Has dementia at baseline-suspect back to usual baseline.  Chronic atrial fibrillation: Rate controlled with metoprolol.  Not a candidate for anticoagulation.  Remains on aspirin  CAD: No anginal symptoms-continue aspirin/metoprolol and statin  Dementia with delirium: Pleasantly confused-suspect  not far from baseline-continue Aricept/Namenda and Risperdal.    Dysphagia: Probably related to dementia-evaluated by SLP-continue dysphagia 3 diet   Discharge Condition:  Stable     Discharge Instructions  and  Discharge Medications     Discharge Instructions    Diet - low sodium heart healthy   Complete by:  As directed    Diet recommendations: Thin liquid;Dysphagia 3 (mechanical soft) Liquids provided via: Cup;Straw Medication Administration: Crushed with puree Supervision: Staff to assist with self feeding;Full supervision/cueing for compensatory strategies Compensations: Slow rate;Small sips/bites;Minimize environmental distractions Postural Changes and/or Swallow Maneuvers: Seated upright 90 degrees   Discharge instructions   Complete by:  As directed    Follow with Primary MD  Eulas Post, MD in 1 week  Please get a complete blood count and chemistry panel checked by your Primary MD at your next visit, and again as instructed by your Primary MD.  Get Medicines reviewed and adjusted: Please take all your medications with you for your next visit with your Primary MD  Laboratory/radiological data: Please request your Primary MD to go over all hospital tests and procedure/radiological results at the follow up, please ask your Primary MD to get all Hospital records sent to his/her office.  In some cases, they will be blood work, cultures and biopsy results pending at the time of your discharge. Please request that your primary care M.D. follows up on these results.  Also Note the following: If you experience worsening of your admission symptoms, develop shortness of breath, life threatening emergency, suicidal or homicidal thoughts you must seek medical attention immediately by calling 911 or calling your MD immediately  if symptoms less severe.  You must read complete instructions/literature along with all the possible adverse reactions/side effects for all the  Medicines you take and that have been prescribed to you. Take any new Medicines after you have completely understood and accpet all the possible adverse reactions/side effects.   Do not drive when taking Pain medications or sleeping medications (Benzodaizepines)  Do not take more than prescribed Pain, Sleep and Anxiety Medications. It is not advisable to combine anxiety,sleep and pain medications without talking with your primary care practitioner  Special Instructions: If you have smoked or chewed Tobacco  in the last 2 yrs please stop smoking, stop any regular Alcohol  and or any Recreational drug use.  Wear Seat belts while driving.  Please note: You were cared for by a hospitalist during your hospital stay. Once you are discharged, your primary care physician will handle any further medical issues. Please note that NO REFILLS for any discharge medications will be authorized once you are discharged, as it is imperative that you return to your primary care physician (or establish  a relationship with a primary care physician if you do not have one) for your post hospital discharge needs so that they can reassess your need for medications and monitor your lab values.   Increase activity slowly   Complete by:  As directed      Allergies as of 07/05/2018      Reactions   Penicillins Other (See Comments)   Has patient had a PCN reaction causing immediate rash, facial/tongue/throat swelling, SOB or lightheadedness with hypotension: Unknown Has patient had a PCN reaction causing severe rash involving mucus membranes or skin necrosis: Unknown Has patient had a PCN reaction that required hospitalization: Unknown Has patient had a PCN reaction occurring within the last 10 years: Unknown If all of the above answers are "NO", then may proceed with Cephalosporin use.      Medication List    STOP taking these medications   levofloxacin 750 MG tablet Commonly known as:  LEVAQUIN   warfarin 5 MG  tablet Commonly known as:  COUMADIN     TAKE these medications   acetaminophen 325 MG tablet Commonly known as:  TYLENOL Take 2 tablets (650 mg total) by mouth every 6 (six) hours as needed for mild pain, moderate pain, fever or headache (or Fever >/= 101). What changed:    medication strength  how much to take  reasons to take this   aspirin EC 81 MG tablet Take 81 mg by mouth daily.   atorvastatin 40 MG tablet Commonly known as:  LIPITOR Take 1 tablet (40 mg total) daily by mouth.   Centrum Silver 50+Men Tabs Take 1 tablet by mouth daily.   [facility] Tussin DM 10-100 MG/5ML liquid Generic drug:  dextromethorphan-guaiFENesin Take 10 mLs by mouth every 6 (six) hours as needed for cough or congestion.   donepezil 10 MG tablet Commonly known as:  ARICEPT TAKE 1 TABLET (10 MG TOTAL) BY MOUTH AT BEDTIME.   Melatonin 3 MG Tabs Take 3 mg by mouth at bedtime. What changed:  Another medication with the same name was removed. Continue taking this medication, and follow the directions you see here.   memantine 10 MG tablet Commonly known as:  NAMENDA Take 1 tablet (10 mg total) by mouth 2 (two) times daily. TAKE   1 TABLET (10MG ) BY MOUTH TWICE DAILY What changed:  additional instructions   metoprolol tartrate 25 MG tablet Commonly known as:  LOPRESSOR TAKE ONE-HALF TABLETS (12.5 MG TOTAL) BY MOUTH DAILY. What changed:  See the new instructions.   ondansetron 8 MG disintegrating tablet Commonly known as:  Zofran ODT Take 1 tablet (8 mg total) by mouth every 8 (eight) hours as needed for nausea or vomiting.   risperiDONE 0.25 MG tablet Commonly known as:  RisperDAL Take 1 tablet (0.25 mg total) by mouth 2 (two) times daily as needed (anxiety). What changed:    when to take this  reasons to take this         Diet and Activity recommendation: See Discharge Instructions above   Consults obtained - None   Major procedures and Radiology Reports - PLEASE review  detailed and final reports for all details, in brief -      Ct Head Wo Contrast  Result Date: 06/19/2018 CLINICAL DATA:  85 year old male with shortness of breath weakness, altered mental status. Fever of 101.4 F. EXAM: CT HEAD WITHOUT CONTRAST TECHNIQUE: Contiguous axial images were obtained from the base of the skull through the vertex without intravenous contrast. COMPARISON:  Head  CT 01/14/2017, MRI 12/26/2016 and earlier. FINDINGS: Brain: Stable cerebral volume. Stable ventricle size and configuration. No midline shift, mass effect, or evidence of intracranial mass lesion. No acute intracranial hemorrhage identified. Stable gray-white matter differentiation throughout the brain. No cortically based acute infarct identified. Vascular: Calcified atherosclerosis at the skull base. No suspicious intracranial vascular hyperdensity. Skull: No acute osseous abnormality identified. Sinuses/Orbits: Visualized paranasal sinuses and mastoids are stable and well pneumatized. Other: Stable orbit and scalp soft tissues. IMPRESSION: Stable non contrast CT appearance of the brain since 2018. No acute intracranial abnormality. Electronically Signed   By: Genevie Ann M.D.   On: 06/19/2018 00:24   Dg Chest Portable 1 View  Result Date: 06/18/2018 CLINICAL DATA:  85 year old male with recent pneumonia. Fever and shortness of breath. COVID-19 status is pending. EXAM: PORTABLE CHEST 1 VIEW COMPARISON:  01/14/2017 and earlier. FINDINGS: Portable AP semi upright view at 2042 hours. Ill-defined abnormal right upper lobe opacity near the hilum is new since 2018. there is also patchy confluent opacity at the right lateral costophrenic angle. There is mild superimposed asymmetric indistinct and interstitial opacity in both lungs. Chronic prosthetic cardiac valve. Stable cardiac size and mediastinal contours. Calcified aortic atherosclerosis. Visualized tracheal air column is within normal limits. No pneumothorax or pleural  effusion. Negative visible bowel gas pattern. No acute osseous abnormality identified. IMPRESSION: Abnormal right greater than left pulmonary opacity most confluent in the right upper lobe opacity. Consider acute pneumonia versus viral/atypical respiratory infection. No pleural effusion. Electronically Signed   By: Genevie Ann M.D.   On: 06/18/2018 21:43    Micro Results     Recent Results (from the past 240 hour(s))  Novel Coronavirus, NAA (hospital order; send-out to ref lab)     Status: None   Collection Time: 06/27/18  7:38 AM  Result Value Ref Range Status   SARS-CoV-2, NAA NOT DETECTED NOT DETECTED Final    Comment: (NOTE) This test was developed and its performance characteristics determined by [employer]. This test has not been FDA cleared or approved. This test has been authorized by FDA under an Emergency Use Authorization (EUA). This test is only authorized for the duration of time the declaration that circumstances exist justifying the authorization of the emergency use of in vitro diagnostic tests for detection of SARS-CoV-2 virus and/or diagnosis of COVID-19 infection under section 564(b)(1) of the Act, 21 U.S.C. 630ZSW-1(U)(9), unless the authorization is terminated or revoked sooner. When diagnostic testing is negative, the possibility of a false negative result should be considered in the context of a patient's recent exposures and the presence of clinical signs and symptoms consistent with COVID-19. An individual without symptoms of COVID-19 and who is not shedding SARS-CoV-2 virus would expect to have a negative (not detected) result in this assay. Performed  At: [facility] [place], [place] [id number] Rush Farmer MD [id number]    [place]  Final    Comment: Performed at [facility] [address], [place], [place] [address]  Novel Coronavirus, NAA (hospital order; send-out to ref  lab)     Status: None   Collection Time: 06/29/18 12:35 PM  Result Value Ref Range Status   SARS-CoV-2, NAA NOT DETECTED NOT DETECTED Final    Comment: (NOTE) This test was developed and its performance characteristics determined by [employer]. This test has not been FDA cleared or approved. This test has been authorized by FDA under an Emergency Use Authorization (EUA). This test is  only authorized for the duration of time the declaration that circumstances exist justifying the authorization of the emergency use of in vitro diagnostic tests for detection of SARS-CoV-2 virus and/or diagnosis of COVID-19 infection under section 564(b)(1) of the Act, 21 U.S.C. 480XKP-5(V)(7), unless the authorization is terminated or revoked sooner. When diagnostic testing is negative, the possibility of a false negative result should be considered in the context of a patient's recent exposures and the presence of clinical signs and symptoms consistent with COVID-19. An individual without symptoms of COVID-19 and who is not shedding SARS-CoV-2 virus would expect to have a negative (not detected) result in this assay. Performed  At: [facility] [place], [place] [id number] Rush Farmer MD JQ:[id number]    [place]  Final    Comment: Performed at [facility] [address], [place], [place] [address]       Today   Subjective:   Marvin Wagner is pleasantly demented, he denies any complaints today, no significant events overnight per staff .   Objective:   Blood pressure 119/86, pulse 72, temperature 97.6 F (36.4 C), temperature source Oral, resp. rate 20, height 6' (1.829 m), weight 81 kg, SpO2 97 %.   Intake/Output Summary (Last 24 hours) at 07/05/2018 1130 Last data filed at 07/05/2018 0923 Gross per 24 hour  Intake 600 ml  Output 125 ml  Net 475 ml    Exam Awake Alert, pleasantly demented, in no apparent  distress Symmetrical Chest wall movement, Good air movement bilaterally, CTAB RRR,No Gallops,Rubs or new Murmurs, No Parasternal Heave +ve B.Sounds, Abd Soft, Non tender,  No rebound -guarding or rigidity. No Cyanosis, Clubbing or edema, No new Rash or bruise  Data Review   CBC w Diff:  Lab Results  Component Value Date   WBC 8.0 06/25/2018   HGB 11.6 (L) 06/25/2018   HCT 36.0 (L) 06/25/2018   PLT 214 06/25/2018   LYMPHOPCT 18 06/25/2018   MONOPCT 4 06/25/2018   EOSPCT 0 06/25/2018   BASOPCT 0 06/25/2018    CMP:  Lab Results  Component Value Date   NA 146 (H) 06/25/2018   NA 146 03/04/2016   K 3.8 06/25/2018   CL 109 06/25/2018   CO2 27 06/25/2018   BUN 31 (H) 06/25/2018   BUN 11 03/04/2016   CREATININE 1.13 07/03/2018   CREATININE 1.02 03/12/2015   GLU 95 03/04/2016   PROT 5.4 (L) 06/25/2018   ALBUMIN 2.7 (L) 06/25/2018   BILITOT 0.7 06/25/2018   ALKPHOS 67 06/25/2018   AST 75 (H) 06/25/2018   ALT 105 (H) 06/25/2018  .   Total Time in preparing paper work, data evaluation and todays exam - 28 minutes  Phillips Climes M.D on 07/05/2018 at 11:30 AM  [facility]   Office  [phone]

## 2018-07-05 NOTE — Discharge Instructions (Signed)
Follow with Primary MD Eulas Post, MD or SNF physician   Activity: As tolerated with Full fall precautions use walker/cane & assistance as needed   Disposition SNF   Diet: Heart Healthy  , with feeding assistance and aspiration precautions.  On your next visit with your primary care physician please Get Medicines reviewed and adjusted.   Please request your Prim.MD to go over all Hospital Tests and Procedure/Radiological results at the follow up, please get all Hospital records sent to your Prim MD by signing hospital release before you go home.   If you experience worsening of your admission symptoms, develop shortness of breath, life threatening emergency, suicidal or homicidal thoughts you must seek medical attention immediately by calling 911 or calling your MD immediately  if symptoms less severe.  You Must read complete instructions/literature along with all the possible adverse reactions/side effects for all the Medicines you take and that have been prescribed to you. Take any new Medicines after you have completely understood and accpet all the possible adverse reactions/side effects.   Do not drive, operating heavy machinery, perform activities at heights, swimming or participation in water activities or provide baby sitting services if your were admitted for syncope or siezures until you have seen by Primary MD or a Neurologist and advised to do so again.  Do not drive when taking Pain medications.    Do not take more than prescribed Pain, Sleep and Anxiety Medications  Special Instructions: If you have smoked or chewed Tobacco  in the last 2 yrs please stop smoking, stop any regular Alcohol  and or any Recreational drug use.  Wear Seat belts while driving.   Please note  You were cared for by a hospitalist during your hospital stay. If you have any questions about your discharge medications or the care you received while you were in the hospital after you are  discharged, you can call the unit and asked to speak with the hospitalist on call if the hospitalist that took care of you is not available. Once you are discharged, your primary care physician will handle any further medical issues. Please note that NO REFILLS for any discharge medications will be authorized once you are discharged, as it is imperative that you return to your primary care physician (or establish a relationship with a primary care physician if you do not have one) for your aftercare needs so that they can reassess your need for medications and monitor your lab values.

## 2018-07-05 NOTE — Progress Notes (Signed)
Guardian has agreed for patient to return to [facility] to ILF with private 24/7 caregivers who will live with patient as well as PT/OT in home. Patient will discharge to ILF tomorrow as Arlina Robes is arranging everything.   [place], [place]

## 2018-07-06 LAB — GLUCOSE, CAPILLARY
Glucose-Capillary: 104 mg/dL — ABNORMAL HIGH (ref 70–99)
Glucose-Capillary: 80 mg/dL (ref 70–99)
Glucose-Capillary: 83 mg/dL (ref 70–99)

## 2018-07-06 NOTE — Progress Notes (Signed)
OT Treatment Note  Pt pleasantly confused. Attempted to exit bed with bed alarm sounding. Able to stand and side step to [facility] with mod A. "Dancing" to Laqueta Linden to side step and complete exercises at bed level. Nsg assisting pt with meal. Continue to recommend OT after DC.     07/06/18 1500  OT Visit Information  Last OT Received On 07/06/18  Assistance Needed +2  History of Present Illness 85 y.o. male admitted 06/19/18 due to fever and confusion.  Pt dx with COVID after second test (initial test was negative) came back positive. Pt also with LFT elevation, AKI, hypernatremia, hypokalemia, acute metabolic encephalopathy.  Pt with other significant PMH of HTN, CAD, aortic stenosis, dementia, and aortic valve replacement.    Precautions  Precautions Fall  Precaution Comments posterior preference  Pain Assessment  Pain Assessment Faces  Faces Pain Scale 0  Cognition  Arousal/Alertness Awake/alert  Behavior During Therapy WFL for tasks assessed/performed  Overall Cognitive Status History of cognitive impairments - at baseline  General Comments Pt awake, smiling, dancing to music, cooperative.   Upper Extremity Assessment  Upper Extremity Assessment Generalized weakness  ADL  Grooming Moderate assistance  Functional mobility during ADLs Moderate assistance  Bed Mobility  Sit to supine Mod assist  General bed mobility comments Multimodal cues used to help pt initiate movement to EOB.  Pt with HOB maximally elevated which helps him more independently come to sitting (and I would recommend at discharge).  Pt using railing as well to pull his trunks and hips to sitting.   Balance  Sitting balance-Leahy Scale Fair  Standing balance-Leahy Scale Poor  Transfers  Overall transfer level Needs assistance  Other Exercises  Other Exercises danced to music for exercise at bed level while seated  OT - End of Session  Activity Tolerance Patient tolerated treatment well  Patient left in bed;with  call bell/phone within reach;with nursing/sitter in room  Nurse Communication Mobility status  OT Assessment/Plan  OT Plan Discharge plan remains appropriate  OT Visit Diagnosis Unsteadiness on feet (R26.81);Other abnormalities of gait and mobility (R26.89);Muscle weakness (generalized) (M62.81);Other symptoms and signs involving cognitive function  OT Frequency (ACUTE ONLY) Min 2X/week  Recommendations for Other Services PT consult  Follow Up Recommendations SNF;Supervision/Assistance - 24 hour  OT Equipment Other (comment)  AM-PAC OT "6 Clicks" Daily Activity Outcome Measure (Version 2)  Help from another person eating meals? 2  Help from another person taking care of personal grooming? 2  Help from another person toileting, which includes using toliet, bedpan, or urinal? 2  Help from another person bathing (including washing, rinsing, drying)? 2  Help from another person to put on and taking off regular upper body clothing? 2  Help from another person to put on and taking off regular lower body clothing? 1  6 Click Score 11  OT Goal Progression  Progress towards OT goals Progressing toward goals  Acute Rehab OT Goals  Patient Stated Goal unable to state  OT Goal Formulation Patient unable to participate in goal setting  Time For Goal Achievement 07/08/18  Potential to Achieve Goals Good  ADL Goals  Pt Will Perform Grooming with min assist;sitting  Pt Will Transfer to Toilet with min assist;stand pivot transfer;bedside commode  Additional ADL Goal #1 Pt will perform bed mobility with Min A in preparation for ADLs  Additional ADL Goal #2 Pt will sustain attention to simple ADLs with Min cues  OT Time Calculation  OT Start Time (ACUTE ONLY) 1226  OT Stop Time (ACUTE ONLY) 1240  OT Time Calculation (min) 14 min  OT General Charges  $OT Visit 1 Visit  OT Treatments  $Self Care/Home Management  8-22 mins  Maurie Boettcher, OT/L   Acute OT Clinical Specialist [place] Pager [phone] Office [phone]

## 2018-07-06 NOTE — Progress Notes (Signed)
Physical Therapy Treatment Patient Details Name: Marvin Wagner MRN: [id number] DOB: 1930-12-20 Today's Date: 07/06/2018    History of Present Illness 85 y.o. male admitted 06/19/18 due to fever and confusion.  Pt dx with COVID after second test (initial test was negative) came back positive. Pt also with LFT elevation, AKI, hypernatremia, hypokalemia, acute metabolic encephalopathy.  Pt with other significant PMH of HTN, CAD, aortic stenosis, dementia, and aortic valve replacement.      PT Comments    Pt was able to get up and OOB with one person assist and RW today.  He is requiring less assistance and tolerating standing longer with multiple repeated stands to "dance" to [place].  He is ready to progress gait with chair to follow next session.     Follow Up Recommendations  Supervision/Assistance - 24 hour;Other (comment)(returning to [employer] apartment with 24/7 sitters.)     [facility] bed    Recommendations for Other Services  NA     Precautions / Restrictions Precautions Precautions: Fall Precaution Comments: posterior preference    Mobility  Bed Mobility Overal bed mobility: Needs Assistance Bed Mobility: Supine to Sit     Supine to sit: Min assist;HOB elevated     General bed mobility comments: Multimodal cues used to help pt initiate movement to EOB.  Pt with HOB maximally elevated which helps him more independently come to sitting (and I would recommend at discharge).  Pt using railing as well to pull his trunks and hips to sitting.   Transfers Overall transfer level: Needs assistance Equipment used: Rolling walker (2 wheeled) Transfers: Sit to/from [facility] Sit to Stand: Mod assist;From elevated surface;Min assist Stand pivot transfers: Mod assist;From elevated surface       General transfer comment: One person mod assist initially to stand and pivot to the recliner chair.  repeated sit to stands  improved assist needed to min.  Pt did fatigue after 3-4 stands to "dance"  Ambulation/Gait             General Gait Details: NT due to only one person in room, he could likely progress gait more safely with chair to follow.           Balance Overall balance assessment: Needs assistance Sitting-balance support: Feet supported;Bilateral upper extremity supported Sitting balance-Leahy Scale: Fair Sitting balance - Comments: close supervision EOB.  Postural control: Posterior lean Standing balance support: Bilateral upper extremity supported Standing balance-Leahy Scale: Poor Standing balance comment: needs min to mod assist in standing.  Cues to get his feet underneath him, posterior lean throughout.  When standing multiple times until fatigue pt was able to weight shift as if "dancing" to frank sanatra music in the background.                              Cognition Arousal/Alertness: Awake/alert Behavior During Therapy: WFL for tasks assessed/performed Overall Cognitive Status: History of cognitive impairments - at baseline                                 General Comments: Pt awake, smiling, dancing to music, cooperative.       Exercises General Exercises - Upper Extremity Shoulder Flexion: AAROM;Both;10 reps Elbow Flexion: AAROM;Both;10 reps General Exercises - Lower Extremity Toe Raises: AROM;Both;10 reps(tap toes to music)    General Comments  Pertinent Vitals/Pain Pain Assessment: Faces Faces Pain Scale: No hurt           PT Goals (current goals can now be found in the care plan section) Acute Rehab PT Goals Patient Stated Goal: unable to state Progress towards PT goals: Progressing toward goals    Frequency    Min 3X/week      PT Plan Discharge plan needs to be updated       AM-PAC PT "6 Clicks" Mobility   Outcome Measure  Help needed turning from your back to your side while in a flat bed without using  bedrails?: A Little Help needed moving from lying on your back to sitting on the side of a flat bed without using bedrails?: A Little Help needed moving to and from a bed to a chair (including a wheelchair)?: A Lot Help needed standing up from a chair using your arms (e.g., wheelchair or bedside chair)?: A Lot Help needed to walk in hospital room?: A Lot Help needed climbing 3-5 steps with a railing? : Total 6 Click Score: 13    End of Session Equipment Utilized During Treatment: Gait belt Activity Tolerance: Patient limited by fatigue Patient left: in chair;with call bell/phone within reach;with chair alarm set   PT Visit Diagnosis: Muscle weakness (generalized) (M62.81);Difficulty in walking, not elsewhere classified (R26.2)     Time: 3888-2800 PT Time Calculation (min) (ACUTE ONLY): 32 min  Charges:  $Therapeutic Activity: 23-37 mins          Jahmari Esbenshade B. Elaynah Virginia, PT, DPT  Acute Rehabilitation [phone] pager #[phone] office             07/06/2018, 10:18 AM

## 2018-07-06 NOTE — Discharge Summary (Addendum)
Marvin Wagner, is a 85 y.o. male  DOB [birth year]  MRN [id number].  Admission date:  06/18/2018  Admitting Physician  Rise Patience, MD  Discharge Date:  07/06/2018   Primary MD  Eulas Post, MD   Addendum:  No significant update on discharge summary adjustment of medication detected on 07/05/2018, patient was seen and examined today, patient is stable for discharge.  Recommendations for primary care physician for things to follow:  - please check CBC, BMP in one week  Code Status: DNR  Heart Healthy Diet   Admission Diagnosis  Hypoxia [R09.02] Multifocal pneumonia [J18.9]   Discharge Diagnosis  Hypoxia [R09.02] Multifocal pneumonia [J18.9]    Principal Problem:   Community acquired pneumonia Active Problems:   Hx of CABG   Dementia ([place])   Acute encephalopathy   CAP (community acquired pneumonia)      Past Medical History:  Diagnosis Date   Aortic insufficiency and aortic stenosis    Aortic stenosis    SEVERE   CAD (coronary artery disease)    Heart murmur    Hyperlipidemia    Hypertension    Mild memory disturbance     Past Surgical History:  Procedure Laterality Date   AORTIC VALVE REPLACEMENT  12/17/08   CARDIAC CATHETERIZATION     CORONARY ANGIOPLASTY     NORMAL LEFT VENTRICULAR SIZEAND CONTRACTILITY WITH NORMAL SYSTOLIC FUNCTION. EF 65%   TONSILLECTOMY         History of present illness and  Hospital Course:     Kindly see H&P for history of present illness and admission details, please review complete Labs, Consult reports and Test reports for all details in brief  HPI  from the history and physical done on the day of admission 06/18/2018  HPI: Marvin Wagner is a 85 y.o. male with history of dementia A. fib stroke hypertension and CAD was found to be increasingly confused over the last 1 week had chest x-ray done as per the report on Jun 15, 2018 was showing infiltrates but no antibiotics were started at that time since patient became more febrile and confused yesterday patient was brought to the ER.  Per daughter patient mental status is at the baseline per the discussion with the ER physician.  ED Course: In the ER patient is not oriented.  Moves all extremities CT head is unremarkable.  Chest x-ray shows infiltrates most of the right side concerning for atypical pneumonia.  COVID-19 test was negative.  Patient was febrile with temperature of 101.4 F.  Lab work show sodium of 141 creatinine 1.1 AST 76 ALT 38 lactic acid 1.1 hemoglobin 12.8 platelets 145.  Blood cultures were obtained patient was started on empiric antibiotics for pneumonia and admitted for further work-up.   Hospital Course   Patient is a 85 y.o. male with PMHx of dementia, AFib, HTN, and CAD who presented with confusion and fever found to have covid-19 viral pneumonia.  Markedly better with 1 dose of Actemra and steroids.  He  is now on room air-remains pleasantly confused and at baseline.  COVID-19 viral pneumonia: Improved-patient is s/p Actemra and steroids.  Inflammatory markers have trended down, patient is on room air.   -She had negative test x2 on 5/27, 5/29 COVID-19 Labs:   COVID-19 Medications: 5/20>> Actemra 5/19>> 5/22>> Solu-Medrol  Transaminitis: Probably secondary to COVID-19 or from Actemra-continue to slowly trending doswn.  Follow periodically.   AKI: Resolved-likely hemodynamically mediated  Hypernatremia: Improved-monitor periodically.  Acute metabolic encephalopathy: Secondary to COVID-19 infection-improved with supportive care.  Has dementia at baseline-suspect back to usual baseline.  Chronic atrial fibrillation: Rate controlled with metoprolol.  Not a candidate for anticoagulation.  Remains on aspirin  CAD: No anginal symptoms-continue aspirin/metoprolol and statin  Dementia with delirium: Pleasantly confused-suspect  not far from baseline-continue Aricept/Namenda and Risperdal.    Dysphagia: Probably related to dementia-evaluated by SLP-continue dysphagia 3 diet   Discharge Condition:  Stable Follow-up [facility] Follow up.   Why:  Wheelchair, bedside commode, rolling walker Contact information: [place] [address] [phone]             Discharge Instructions  and  Discharge Medications     Discharge Instructions    Diet - low sodium heart healthy   Complete by:  As directed    Diet recommendations: Thin liquid;Dysphagia 3 (mechanical soft) Liquids provided via: Cup;Straw Medication Administration: Crushed with puree Supervision: Staff to assist with self feeding;Full supervision/cueing for compensatory strategies Compensations: Slow rate;Small sips/bites;Minimize environmental distractions Postural Changes and/or Swallow Maneuvers: Seated upright 90 degrees   Discharge instructions   Complete by:  As directed    Follow with Primary MD  Eulas Post, MD in 1 week  Please get a complete blood count and chemistry panel checked by your Primary MD at your next visit, and again as instructed by your Primary MD.  Get Medicines reviewed and adjusted: Please take all your medications with you for your next visit with your Primary MD  Laboratory/radiological data: Please request your Primary MD to go over all hospital tests and procedure/radiological results at the follow up, please ask your Primary MD to get all Hospital records sent to his/her office.  In some cases, they will be blood work, cultures and biopsy results pending at the time of your discharge. Please request that your primary care M.D. follows up on these results.  Also Note the following: If you experience worsening of your admission symptoms, develop shortness of breath, life threatening emergency, suicidal or homicidal thoughts you must seek medical attention  immediately by calling 911 or calling your MD immediately  if symptoms less severe.  You must read complete instructions/literature along with all the possible adverse reactions/side effects for all the Medicines you take and that have been prescribed to you. Take any new Medicines after you have completely understood and accpet all the possible adverse reactions/side effects.   Do not drive when taking Pain medications or sleeping medications (Benzodaizepines)  Do not take more than prescribed Pain, Sleep and Anxiety Medications. It is not advisable to combine anxiety,sleep and pain medications without talking with your primary care practitioner  Special Instructions: If you have smoked or chewed Tobacco  in the last 2 yrs please stop smoking, stop any regular Alcohol  and or any Recreational drug use.  Wear Seat belts while driving.  Please note: You were cared for by a hospitalist during your hospital stay. Once you are discharged, your primary care physician  will handle any further medical issues. Please note that NO REFILLS for any discharge medications will be authorized once you are discharged, as it is imperative that you return to your primary care physician (or establish a relationship with a primary care physician if you do not have one) for your post hospital discharge needs so that they can reassess your need for medications and monitor your lab values.   Increase activity slowly   Complete by:  As directed      Allergies as of 07/06/2018      Reactions   Penicillins Other (See Comments)   Has patient had a PCN reaction causing immediate rash, facial/tongue/throat swelling, SOB or lightheadedness with hypotension: Unknown Has patient had a PCN reaction causing severe rash involving mucus membranes or skin necrosis: Unknown Has patient had a PCN reaction that required hospitalization: Unknown Has patient had a PCN reaction occurring within the last 10 years: Unknown If all of the  above answers are "NO", then may proceed with Cephalosporin use.      Medication List    STOP taking these medications   levofloxacin 750 MG tablet Commonly known as:  LEVAQUIN   warfarin 5 MG tablet Commonly known as:  COUMADIN     TAKE these medications   acetaminophen 325 MG tablet Commonly known as:  TYLENOL Take 2 tablets (650 mg total) by mouth every 6 (six) hours as needed for mild pain, moderate pain, fever or headache (or Fever >/= 101). What changed:    medication strength  how much to take  reasons to take this   aspirin EC 81 MG tablet Take 81 mg by mouth daily.   atorvastatin 40 MG tablet Commonly known as:  LIPITOR Take 1 tablet (40 mg total) daily by mouth.   Centrum Silver 50+Men Tabs Take 1 tablet by mouth daily.   [employer] Tussin DM 10-100 MG/5ML liquid Generic drug:  dextromethorphan-guaiFENesin Take 10 mLs by mouth every 6 (six) hours as needed for cough or congestion.   donepezil 10 MG tablet Commonly known as:  ARICEPT TAKE 1 TABLET (10 MG TOTAL) BY MOUTH AT BEDTIME.   Melatonin 3 MG Tabs Take 3 mg by mouth at bedtime. What changed:  Another medication with the same name was removed. Continue taking this medication, and follow the directions you see here.   memantine 10 MG tablet Commonly known as:  NAMENDA Take 1 tablet (10 mg total) by mouth 2 (two) times daily. TAKE   1 TABLET (10MG ) BY MOUTH TWICE DAILY What changed:  additional instructions   metoprolol tartrate 25 MG tablet Commonly known as:  LOPRESSOR TAKE ONE-HALF TABLETS (12.5 MG TOTAL) BY MOUTH DAILY. What changed:  See the new instructions.   ondansetron 8 MG disintegrating tablet Commonly known as:  Zofran ODT Take 1 tablet (8 mg total) by mouth every 8 (eight) hours as needed for nausea or vomiting.   risperiDONE 0.25 MG tablet Commonly known as:  RisperDAL Take 1 tablet (0.25 mg total) by mouth 2 (two) times daily as needed (anxiety). What changed:    when to take  this  reasons to take this            Durable Medical Equipment  (From admission, onward)         Start     Ordered   07/05/18 1703  For home use only DME Hospital bed  Once    Question Answer Comment  Length of Need Lifetime   The above medical  condition requires: Patient requires the ability to reposition frequently   Head must be elevated greater than: 30 degrees   Bed type Semi-electric      07/05/18 1703            Diet and Activity recommendation: See Discharge Instructions above   Consults obtained - None   Major procedures and Radiology Reports - PLEASE review detailed and final reports for all details, in brief -      Ct Head Wo Contrast  Result Date: 06/19/2018 CLINICAL DATA:  85 year old male with shortness of breath weakness, altered mental status. Fever of 101.4 F. EXAM: CT HEAD WITHOUT CONTRAST TECHNIQUE: Contiguous axial images were obtained from the base of the skull through the vertex without intravenous contrast. COMPARISON:  Head CT 01/14/2017, MRI 12/26/2016 and earlier. FINDINGS: Brain: Stable cerebral volume. Stable ventricle size and configuration. No midline shift, mass effect, or evidence of intracranial mass lesion. No acute intracranial hemorrhage identified. Stable gray-white matter differentiation throughout the brain. No cortically based acute infarct identified. Vascular: Calcified atherosclerosis at the skull base. No suspicious intracranial vascular hyperdensity. Skull: No acute osseous abnormality identified. Sinuses/Orbits: Visualized paranasal sinuses and mastoids are stable and well pneumatized. Other: Stable orbit and scalp soft tissues. IMPRESSION: Stable non contrast CT appearance of the brain since 2018. No acute intracranial abnormality. Electronically Signed   By: Genevie Ann M.D.   On: 06/19/2018 00:24   Dg Chest Portable 1 View  Result Date: 06/18/2018 CLINICAL DATA:  85 year old male with recent pneumonia. Fever and shortness of  breath. COVID-19 status is pending. EXAM: PORTABLE CHEST 1 VIEW COMPARISON:  01/14/2017 and earlier. FINDINGS: Portable AP semi upright view at 2042 hours. Ill-defined abnormal right upper lobe opacity near the hilum is new since 2018. there is also patchy confluent opacity at the right lateral costophrenic angle. There is mild superimposed asymmetric indistinct and interstitial opacity in both lungs. Chronic prosthetic cardiac valve. Stable cardiac size and mediastinal contours. Calcified aortic atherosclerosis. Visualized tracheal air column is within normal limits. No pneumothorax or pleural effusion. Negative visible bowel gas pattern. No acute osseous abnormality identified. IMPRESSION: Abnormal right greater than left pulmonary opacity most confluent in the right upper lobe opacity. Consider acute pneumonia versus viral/atypical respiratory infection. No pleural effusion. Electronically Signed   By: Genevie Ann M.D.   On: 06/18/2018 21:43    Micro Results     Recent Results (from the past 240 hour(s))  Novel Coronavirus, NAA (hospital order; send-out to ref lab)     Status: None   Collection Time: 06/27/18  7:38 AM  Result Value Ref Range Status   SARS-CoV-2, NAA NOT DETECTED NOT DETECTED Final    Comment: (NOTE) This test was developed and its performance characteristics determined by [employer]. This test has not been FDA cleared or approved. This test has been authorized by FDA under an Emergency Use Authorization (EUA). This test is only authorized for the duration of time the declaration that circumstances exist justifying the authorization of the emergency use of in vitro diagnostic tests for detection of SARS-CoV-2 virus and/or diagnosis of COVID-19 infection under section 564(b)(1) of the Act, 21 U.S.C. 315QMG-8(Q)(7), unless the authorization is terminated or revoked sooner. When diagnostic testing is negative, the possibility of a false negative result should be  considered in the context of a patient's recent exposures and the presence of clinical signs and symptoms consistent with COVID-19. An individual without symptoms of COVID-19 and who is not shedding SARS-CoV-2 virus would  expect to have a negative (not detected) result in this assay. Performed  At: [facility] [place], [place] [id number] Rush Farmer MD GQ:[id number]    [place]  Final    Comment: Performed at [facility] [address], [place], [place] [address]  Novel Coronavirus, NAA (hospital order; send-out to ref lab)     Status: None   Collection Time: 06/29/18 12:35 PM  Result Value Ref Range Status   SARS-CoV-2, NAA NOT DETECTED NOT DETECTED Final    Comment: (NOTE) This test was developed and its performance characteristics determined by [employer]. This test has not been FDA cleared or approved. This test has been authorized by FDA under an Emergency Use Authorization (EUA). This test is only authorized for the duration of time the declaration that circumstances exist justifying the authorization of the emergency use of in vitro diagnostic tests for detection of SARS-CoV-2 virus and/or diagnosis of COVID-19 infection under section 564(b)(1) of the Act, 21 U.S.C. 580DXI-3(J)(8), unless the authorization is terminated or revoked sooner. When diagnostic testing is negative, the possibility of a false negative result should be considered in the context of a patient's recent exposures and the presence of clinical signs and symptoms consistent with COVID-19. An individual without symptoms of COVID-19 and who is not shedding SARS-CoV-2 virus would expect to have a negative (not detected) result in this assay. Performed  At: [facility] [place], [place] [id number] Rush Farmer MD HA:[id number]    [place]  Final    Comment: Performed at [facility] [address], [place], [place] [address]       Today   Subjective:   Marvin Wagner is pleasantly demented, he denies any complaints today, no significant events overnight per staff .   Objective:   Blood pressure (!) 144/55, pulse 86, temperature 97.9 F (36.6 C), temperature source Axillary, resp. rate 17, height 6' (1.829 m), weight 81 kg, SpO2 94 %.   Intake/Output Summary (Last 24 hours) at 07/06/2018 1129 Last data filed at 07/06/2018 0900 Gross per 24 hour  Intake 120 ml  Output 60 ml  Net 60 ml    Exam Awake Alert, pleasantly demented, in no apparent distress Symmetrical Chest wall movement, Good air movement bilaterally, CTAB RRR,No Gallops,Rubs or new Murmurs, No Parasternal Heave +ve B.Sounds, Abd Soft, Non tender,  No rebound -guarding or rigidity. No Cyanosis, Clubbing or edema, No new Rash or bruise  Data Review   CBC w Diff:  Lab Results  Component Value Date   WBC 8.0 06/25/2018   HGB 11.6 (L) 06/25/2018   HCT 36.0 (L) 06/25/2018   PLT 214 06/25/2018   LYMPHOPCT 18 06/25/2018   MONOPCT 4 06/25/2018   EOSPCT 0 06/25/2018   BASOPCT 0 06/25/2018    CMP:  Lab Results  Component Value Date   NA 146 (H) 06/25/2018   NA 146 03/04/2016   K 3.8 06/25/2018   CL 109 06/25/2018   CO2 27 06/25/2018   BUN 31 (H) 06/25/2018   BUN 11 03/04/2016   CREATININE 1.13 07/03/2018   CREATININE 1.02 03/12/2015   GLU 95 03/04/2016   PROT 5.4 (L) 06/25/2018   ALBUMIN 2.7 (L) 06/25/2018   BILITOT 0.7 06/25/2018   ALKPHOS 67 06/25/2018   AST 75 (H) 06/25/2018   ALT 105 (H) 06/25/2018  .   Total Time in preparing paper work, data evaluation and todays exam - No charge  Phillips Climes M.D on 07/06/2018 at 11:29 AM  [facility]   Office  [phone]

## 2018-07-06 NOTE — Progress Notes (Signed)
Patient will DC to: [facility] Anticipated DC date: 07/06/2018 Family notified: [name] by: Corey Harold  Per MD patient ready for DC to [facility] . RN, patient, patient's family, and facility notified of DC. DC packet on chart. Equipment will be delivered to facility at 3:30 pm today, caregivers plan on arriving at 2:00 today to be prepared for patient's arrival. [name] and [facility] have been updated on this information. Ambulance transport requested for patient for 4:00 pm.  CSW signing off.  [place], [place]

## 2018-07-06 NOTE — Progress Notes (Signed)
07/06/2018 Patient suffers from dementia and is recovering from COVID-19 which impairs their ability to perform daily activities like getting up to the bathroom in the home.  A walker alone will not resolve the issues with performing activities of daily living. A wheelchair will allow patient to safely perform daily activities.  The patient can self propel in the home or has a caregiver who can provide assistance.     Barbarann Ehlers Kaisha Wachob, PT, DPT  Acute Rehabilitation [phone] pager [phone]) [phone] office

## 2018-07-06 NOTE — TOC Transition Note (Signed)
Transition of Care [facility]) - CM/SW Discharge Note   Patient Details  Name: Marvin Wagner MRN: [id number] Date of Birth: [birth year]  Transition of Care [facility]) CM/SW Contact:  Midge Minium RN, BSN, NCM-BC, ACM-RN [phone] (working remotely) Phone Number: 07/06/2018, 11:46 AM   Clinical Narrative:    CM following for dispositional needs. Patient will be transitioning to [facility] to ILF today, with 24hr caregivers and DME (wheelchair, electric bed, loaner rolling walker and BSC) provided and delivered to the facility by [employer]. PTAR will be arranged once confirmation of delivery has been obtained.    Final next level of care: Assisted Living Barriers to Discharge: No Barriers Identified   Patient Goals and CMS Choice   CMS Medicare.gov Compare Post Acute Care list provided to:: Legal Guardian(Ann) Choice offered to / list presented to : [place] / [place]  Discharge Placement   [facility] [place]                    Discharge Plan and Services                DME Arranged: Hospital bed, Bedside commode, Walker rolling DME Agency: [place] Date DME Agency Contacted: 07/06/18 Time DME Agency Contacted: [id number] Representative spoke with at DME Agency: Learta Codding liaison HH Arranged: NA [place] Agency: NA        Social Determinants of Health (SDOH) Interventions     Readmission Risk Interventions No flowsheet data found.

## 2018-07-06 NOTE — Progress Notes (Addendum)
07/06/2018 Patient suffers from advanced dementia and recovering from COVID-19 virus which impairs their ability to perform daily activities like getting up OOB easily in the home.  A regular bed will inhibit performing activities of daily living like getting up OOB to a WC or recliner chair. A hospital bed will allow patient and staff to more safely get pt up and provide a way for upright posture when in bed to help further improve his lung function.  I am therefore recommending a hospital bed for Marvin Wagner at discharge.    Length of Need Lifetime   The above medical condition requires: Patient requires the ability to reposition frequently   Head must be elevated greater than: 30 degrees   Bed type Semi-electric      Marvin Wagner B. Lucciano Vitali, PT, DPT  Acute Rehabilitation [phone] pager [phone]) [phone] office

## 2018-07-09 DIAGNOSIS — R488 Other symbolic dysfunctions: Secondary | ICD-10-CM | POA: Diagnosis not present

## 2018-07-09 DIAGNOSIS — R262 Difficulty in walking, not elsewhere classified: Secondary | ICD-10-CM | POA: Diagnosis not present

## 2018-07-09 DIAGNOSIS — R2681 Unsteadiness on feet: Secondary | ICD-10-CM | POA: Diagnosis not present

## 2018-07-09 DIAGNOSIS — R0602 Shortness of breath: Secondary | ICD-10-CM | POA: Diagnosis not present

## 2018-07-09 DIAGNOSIS — M6281 Muscle weakness (generalized): Secondary | ICD-10-CM | POA: Diagnosis not present

## 2018-07-10 DIAGNOSIS — R2681 Unsteadiness on feet: Secondary | ICD-10-CM | POA: Diagnosis not present

## 2018-07-10 DIAGNOSIS — R488 Other symbolic dysfunctions: Secondary | ICD-10-CM | POA: Diagnosis not present

## 2018-07-10 DIAGNOSIS — R0602 Shortness of breath: Secondary | ICD-10-CM | POA: Diagnosis not present

## 2018-07-10 DIAGNOSIS — R262 Difficulty in walking, not elsewhere classified: Secondary | ICD-10-CM | POA: Diagnosis not present

## 2018-07-10 DIAGNOSIS — M6281 Muscle weakness (generalized): Secondary | ICD-10-CM | POA: Diagnosis not present

## 2018-07-11 DIAGNOSIS — R0602 Shortness of breath: Secondary | ICD-10-CM | POA: Diagnosis not present

## 2018-07-11 DIAGNOSIS — R488 Other symbolic dysfunctions: Secondary | ICD-10-CM | POA: Diagnosis not present

## 2018-07-11 DIAGNOSIS — M6281 Muscle weakness (generalized): Secondary | ICD-10-CM | POA: Diagnosis not present

## 2018-07-11 DIAGNOSIS — R262 Difficulty in walking, not elsewhere classified: Secondary | ICD-10-CM | POA: Diagnosis not present

## 2018-07-11 DIAGNOSIS — R2681 Unsteadiness on feet: Secondary | ICD-10-CM | POA: Diagnosis not present

## 2018-07-12 DIAGNOSIS — M6281 Muscle weakness (generalized): Secondary | ICD-10-CM | POA: Diagnosis not present

## 2018-07-12 DIAGNOSIS — R488 Other symbolic dysfunctions: Secondary | ICD-10-CM | POA: Diagnosis not present

## 2018-07-12 DIAGNOSIS — R2681 Unsteadiness on feet: Secondary | ICD-10-CM | POA: Diagnosis not present

## 2018-07-12 DIAGNOSIS — R262 Difficulty in walking, not elsewhere classified: Secondary | ICD-10-CM | POA: Diagnosis not present

## 2018-07-12 DIAGNOSIS — R0602 Shortness of breath: Secondary | ICD-10-CM | POA: Diagnosis not present

## 2018-07-13 DIAGNOSIS — M6281 Muscle weakness (generalized): Secondary | ICD-10-CM | POA: Diagnosis not present

## 2018-07-13 DIAGNOSIS — R2681 Unsteadiness on feet: Secondary | ICD-10-CM | POA: Diagnosis not present

## 2018-07-13 DIAGNOSIS — R488 Other symbolic dysfunctions: Secondary | ICD-10-CM | POA: Diagnosis not present

## 2018-07-13 DIAGNOSIS — R0602 Shortness of breath: Secondary | ICD-10-CM | POA: Diagnosis not present

## 2018-07-13 DIAGNOSIS — R262 Difficulty in walking, not elsewhere classified: Secondary | ICD-10-CM | POA: Diagnosis not present

## 2018-07-16 DIAGNOSIS — R262 Difficulty in walking, not elsewhere classified: Secondary | ICD-10-CM | POA: Diagnosis not present

## 2018-07-16 DIAGNOSIS — R0602 Shortness of breath: Secondary | ICD-10-CM | POA: Diagnosis not present

## 2018-07-16 DIAGNOSIS — M6281 Muscle weakness (generalized): Secondary | ICD-10-CM | POA: Diagnosis not present

## 2018-07-16 DIAGNOSIS — R488 Other symbolic dysfunctions: Secondary | ICD-10-CM | POA: Diagnosis not present

## 2018-07-16 DIAGNOSIS — R2681 Unsteadiness on feet: Secondary | ICD-10-CM | POA: Diagnosis not present

## 2018-07-17 DIAGNOSIS — R488 Other symbolic dysfunctions: Secondary | ICD-10-CM | POA: Diagnosis not present

## 2018-07-17 DIAGNOSIS — M6281 Muscle weakness (generalized): Secondary | ICD-10-CM | POA: Diagnosis not present

## 2018-07-17 DIAGNOSIS — R2681 Unsteadiness on feet: Secondary | ICD-10-CM | POA: Diagnosis not present

## 2018-07-17 DIAGNOSIS — R262 Difficulty in walking, not elsewhere classified: Secondary | ICD-10-CM | POA: Diagnosis not present

## 2018-07-17 DIAGNOSIS — R0602 Shortness of breath: Secondary | ICD-10-CM | POA: Diagnosis not present

## 2018-07-18 DIAGNOSIS — M6281 Muscle weakness (generalized): Secondary | ICD-10-CM | POA: Diagnosis not present

## 2018-07-18 DIAGNOSIS — R488 Other symbolic dysfunctions: Secondary | ICD-10-CM | POA: Diagnosis not present

## 2018-07-18 DIAGNOSIS — R262 Difficulty in walking, not elsewhere classified: Secondary | ICD-10-CM | POA: Diagnosis not present

## 2018-07-18 DIAGNOSIS — R2681 Unsteadiness on feet: Secondary | ICD-10-CM | POA: Diagnosis not present

## 2018-07-18 DIAGNOSIS — R0602 Shortness of breath: Secondary | ICD-10-CM | POA: Diagnosis not present

## 2018-07-19 DIAGNOSIS — R0602 Shortness of breath: Secondary | ICD-10-CM | POA: Diagnosis not present

## 2018-07-19 DIAGNOSIS — R2681 Unsteadiness on feet: Secondary | ICD-10-CM | POA: Diagnosis not present

## 2018-07-19 DIAGNOSIS — M6281 Muscle weakness (generalized): Secondary | ICD-10-CM | POA: Diagnosis not present

## 2018-07-19 DIAGNOSIS — R488 Other symbolic dysfunctions: Secondary | ICD-10-CM | POA: Diagnosis not present

## 2018-07-19 DIAGNOSIS — R262 Difficulty in walking, not elsewhere classified: Secondary | ICD-10-CM | POA: Diagnosis not present

## 2018-07-20 DIAGNOSIS — R2681 Unsteadiness on feet: Secondary | ICD-10-CM | POA: Diagnosis not present

## 2018-07-20 DIAGNOSIS — M6281 Muscle weakness (generalized): Secondary | ICD-10-CM | POA: Diagnosis not present

## 2018-07-20 DIAGNOSIS — R488 Other symbolic dysfunctions: Secondary | ICD-10-CM | POA: Diagnosis not present

## 2018-07-20 DIAGNOSIS — R262 Difficulty in walking, not elsewhere classified: Secondary | ICD-10-CM | POA: Diagnosis not present

## 2018-07-20 DIAGNOSIS — R0602 Shortness of breath: Secondary | ICD-10-CM | POA: Diagnosis not present

## 2018-07-23 DIAGNOSIS — R488 Other symbolic dysfunctions: Secondary | ICD-10-CM | POA: Diagnosis not present

## 2018-07-23 DIAGNOSIS — R262 Difficulty in walking, not elsewhere classified: Secondary | ICD-10-CM | POA: Diagnosis not present

## 2018-07-23 DIAGNOSIS — R2681 Unsteadiness on feet: Secondary | ICD-10-CM | POA: Diagnosis not present

## 2018-07-23 DIAGNOSIS — M6281 Muscle weakness (generalized): Secondary | ICD-10-CM | POA: Diagnosis not present

## 2018-07-23 DIAGNOSIS — R0602 Shortness of breath: Secondary | ICD-10-CM | POA: Diagnosis not present

## 2018-07-24 DIAGNOSIS — R0602 Shortness of breath: Secondary | ICD-10-CM | POA: Diagnosis not present

## 2018-07-24 DIAGNOSIS — R488 Other symbolic dysfunctions: Secondary | ICD-10-CM | POA: Diagnosis not present

## 2018-07-24 DIAGNOSIS — M6281 Muscle weakness (generalized): Secondary | ICD-10-CM | POA: Diagnosis not present

## 2018-07-24 DIAGNOSIS — R262 Difficulty in walking, not elsewhere classified: Secondary | ICD-10-CM | POA: Diagnosis not present

## 2018-07-24 DIAGNOSIS — R2681 Unsteadiness on feet: Secondary | ICD-10-CM | POA: Diagnosis not present

## 2018-07-25 DIAGNOSIS — R0602 Shortness of breath: Secondary | ICD-10-CM | POA: Diagnosis not present

## 2018-07-25 DIAGNOSIS — R2681 Unsteadiness on feet: Secondary | ICD-10-CM | POA: Diagnosis not present

## 2018-07-25 DIAGNOSIS — M6281 Muscle weakness (generalized): Secondary | ICD-10-CM | POA: Diagnosis not present

## 2018-07-25 DIAGNOSIS — R262 Difficulty in walking, not elsewhere classified: Secondary | ICD-10-CM | POA: Diagnosis not present

## 2018-07-25 DIAGNOSIS — R488 Other symbolic dysfunctions: Secondary | ICD-10-CM | POA: Diagnosis not present

## 2018-07-26 DIAGNOSIS — R262 Difficulty in walking, not elsewhere classified: Secondary | ICD-10-CM | POA: Diagnosis not present

## 2018-07-26 DIAGNOSIS — R488 Other symbolic dysfunctions: Secondary | ICD-10-CM | POA: Diagnosis not present

## 2018-07-26 DIAGNOSIS — R0602 Shortness of breath: Secondary | ICD-10-CM | POA: Diagnosis not present

## 2018-07-26 DIAGNOSIS — R2681 Unsteadiness on feet: Secondary | ICD-10-CM | POA: Diagnosis not present

## 2018-07-26 DIAGNOSIS — M6281 Muscle weakness (generalized): Secondary | ICD-10-CM | POA: Diagnosis not present

## 2018-07-27 DIAGNOSIS — R2681 Unsteadiness on feet: Secondary | ICD-10-CM | POA: Diagnosis not present

## 2018-07-27 DIAGNOSIS — R0602 Shortness of breath: Secondary | ICD-10-CM | POA: Diagnosis not present

## 2018-07-27 DIAGNOSIS — M6281 Muscle weakness (generalized): Secondary | ICD-10-CM | POA: Diagnosis not present

## 2018-07-27 DIAGNOSIS — R488 Other symbolic dysfunctions: Secondary | ICD-10-CM | POA: Diagnosis not present

## 2018-07-27 DIAGNOSIS — R262 Difficulty in walking, not elsewhere classified: Secondary | ICD-10-CM | POA: Diagnosis not present

## 2018-07-30 DIAGNOSIS — M6281 Muscle weakness (generalized): Secondary | ICD-10-CM | POA: Diagnosis not present

## 2018-07-30 DIAGNOSIS — R0602 Shortness of breath: Secondary | ICD-10-CM | POA: Diagnosis not present

## 2018-07-30 DIAGNOSIS — R488 Other symbolic dysfunctions: Secondary | ICD-10-CM | POA: Diagnosis not present

## 2018-07-30 DIAGNOSIS — R262 Difficulty in walking, not elsewhere classified: Secondary | ICD-10-CM | POA: Diagnosis not present

## 2018-07-30 DIAGNOSIS — R2681 Unsteadiness on feet: Secondary | ICD-10-CM | POA: Diagnosis not present

## 2018-07-31 DIAGNOSIS — R262 Difficulty in walking, not elsewhere classified: Secondary | ICD-10-CM | POA: Diagnosis not present

## 2018-07-31 DIAGNOSIS — R0602 Shortness of breath: Secondary | ICD-10-CM | POA: Diagnosis not present

## 2018-07-31 DIAGNOSIS — R2681 Unsteadiness on feet: Secondary | ICD-10-CM | POA: Diagnosis not present

## 2018-07-31 DIAGNOSIS — R488 Other symbolic dysfunctions: Secondary | ICD-10-CM | POA: Diagnosis not present

## 2018-07-31 DIAGNOSIS — M6281 Muscle weakness (generalized): Secondary | ICD-10-CM | POA: Diagnosis not present

## 2018-08-01 DIAGNOSIS — M6281 Muscle weakness (generalized): Secondary | ICD-10-CM | POA: Diagnosis not present

## 2018-08-01 DIAGNOSIS — R0602 Shortness of breath: Secondary | ICD-10-CM | POA: Diagnosis not present

## 2018-08-01 DIAGNOSIS — R488 Other symbolic dysfunctions: Secondary | ICD-10-CM | POA: Diagnosis not present

## 2018-08-01 DIAGNOSIS — R262 Difficulty in walking, not elsewhere classified: Secondary | ICD-10-CM | POA: Diagnosis not present

## 2018-08-01 DIAGNOSIS — R2681 Unsteadiness on feet: Secondary | ICD-10-CM | POA: Diagnosis not present

## 2018-08-02 DIAGNOSIS — R2681 Unsteadiness on feet: Secondary | ICD-10-CM | POA: Diagnosis not present

## 2018-08-02 DIAGNOSIS — R488 Other symbolic dysfunctions: Secondary | ICD-10-CM | POA: Diagnosis not present

## 2018-08-02 DIAGNOSIS — M6281 Muscle weakness (generalized): Secondary | ICD-10-CM | POA: Diagnosis not present

## 2018-08-02 DIAGNOSIS — R0602 Shortness of breath: Secondary | ICD-10-CM | POA: Diagnosis not present

## 2018-08-02 DIAGNOSIS — R262 Difficulty in walking, not elsewhere classified: Secondary | ICD-10-CM | POA: Diagnosis not present

## 2018-08-03 DIAGNOSIS — R262 Difficulty in walking, not elsewhere classified: Secondary | ICD-10-CM | POA: Diagnosis not present

## 2018-08-03 DIAGNOSIS — R2681 Unsteadiness on feet: Secondary | ICD-10-CM | POA: Diagnosis not present

## 2018-08-03 DIAGNOSIS — R0602 Shortness of breath: Secondary | ICD-10-CM | POA: Diagnosis not present

## 2018-08-03 DIAGNOSIS — M6281 Muscle weakness (generalized): Secondary | ICD-10-CM | POA: Diagnosis not present

## 2018-08-03 DIAGNOSIS — R488 Other symbolic dysfunctions: Secondary | ICD-10-CM | POA: Diagnosis not present

## 2018-08-07 DIAGNOSIS — R2681 Unsteadiness on feet: Secondary | ICD-10-CM | POA: Diagnosis not present

## 2018-08-07 DIAGNOSIS — R262 Difficulty in walking, not elsewhere classified: Secondary | ICD-10-CM | POA: Diagnosis not present

## 2018-08-07 DIAGNOSIS — M6281 Muscle weakness (generalized): Secondary | ICD-10-CM | POA: Diagnosis not present

## 2018-08-07 DIAGNOSIS — R0602 Shortness of breath: Secondary | ICD-10-CM | POA: Diagnosis not present

## 2018-08-07 DIAGNOSIS — R488 Other symbolic dysfunctions: Secondary | ICD-10-CM | POA: Diagnosis not present

## 2018-08-08 DIAGNOSIS — M6281 Muscle weakness (generalized): Secondary | ICD-10-CM | POA: Diagnosis not present

## 2018-08-08 DIAGNOSIS — R2681 Unsteadiness on feet: Secondary | ICD-10-CM | POA: Diagnosis not present

## 2018-08-08 DIAGNOSIS — R488 Other symbolic dysfunctions: Secondary | ICD-10-CM | POA: Diagnosis not present

## 2018-08-08 DIAGNOSIS — R0602 Shortness of breath: Secondary | ICD-10-CM | POA: Diagnosis not present

## 2018-08-08 DIAGNOSIS — R262 Difficulty in walking, not elsewhere classified: Secondary | ICD-10-CM | POA: Diagnosis not present

## 2018-08-09 DIAGNOSIS — R488 Other symbolic dysfunctions: Secondary | ICD-10-CM | POA: Diagnosis not present

## 2018-08-09 DIAGNOSIS — R0602 Shortness of breath: Secondary | ICD-10-CM | POA: Diagnosis not present

## 2018-08-09 DIAGNOSIS — R6889 Other general symptoms and signs: Secondary | ICD-10-CM | POA: Diagnosis not present

## 2018-08-09 DIAGNOSIS — R262 Difficulty in walking, not elsewhere classified: Secondary | ICD-10-CM | POA: Diagnosis not present

## 2018-08-09 DIAGNOSIS — R2681 Unsteadiness on feet: Secondary | ICD-10-CM | POA: Diagnosis not present

## 2018-08-09 DIAGNOSIS — M6281 Muscle weakness (generalized): Secondary | ICD-10-CM | POA: Diagnosis not present

## 2018-08-10 DIAGNOSIS — R0602 Shortness of breath: Secondary | ICD-10-CM | POA: Diagnosis not present

## 2018-08-10 DIAGNOSIS — R2681 Unsteadiness on feet: Secondary | ICD-10-CM | POA: Diagnosis not present

## 2018-08-10 DIAGNOSIS — R262 Difficulty in walking, not elsewhere classified: Secondary | ICD-10-CM | POA: Diagnosis not present

## 2018-08-10 DIAGNOSIS — R488 Other symbolic dysfunctions: Secondary | ICD-10-CM | POA: Diagnosis not present

## 2018-08-10 DIAGNOSIS — M6281 Muscle weakness (generalized): Secondary | ICD-10-CM | POA: Diagnosis not present

## 2018-08-13 DIAGNOSIS — R262 Difficulty in walking, not elsewhere classified: Secondary | ICD-10-CM | POA: Diagnosis not present

## 2018-08-13 DIAGNOSIS — R0602 Shortness of breath: Secondary | ICD-10-CM | POA: Diagnosis not present

## 2018-08-13 DIAGNOSIS — R488 Other symbolic dysfunctions: Secondary | ICD-10-CM | POA: Diagnosis not present

## 2018-08-13 DIAGNOSIS — R2681 Unsteadiness on feet: Secondary | ICD-10-CM | POA: Diagnosis not present

## 2018-08-13 DIAGNOSIS — M6281 Muscle weakness (generalized): Secondary | ICD-10-CM | POA: Diagnosis not present

## 2018-08-14 DIAGNOSIS — R2681 Unsteadiness on feet: Secondary | ICD-10-CM | POA: Diagnosis not present

## 2018-08-14 DIAGNOSIS — M6281 Muscle weakness (generalized): Secondary | ICD-10-CM | POA: Diagnosis not present

## 2018-08-14 DIAGNOSIS — R0602 Shortness of breath: Secondary | ICD-10-CM | POA: Diagnosis not present

## 2018-08-14 DIAGNOSIS — R488 Other symbolic dysfunctions: Secondary | ICD-10-CM | POA: Diagnosis not present

## 2018-08-14 DIAGNOSIS — R262 Difficulty in walking, not elsewhere classified: Secondary | ICD-10-CM | POA: Diagnosis not present

## 2018-08-16 DIAGNOSIS — R488 Other symbolic dysfunctions: Secondary | ICD-10-CM | POA: Diagnosis not present

## 2018-08-16 DIAGNOSIS — R2681 Unsteadiness on feet: Secondary | ICD-10-CM | POA: Diagnosis not present

## 2018-08-16 DIAGNOSIS — R262 Difficulty in walking, not elsewhere classified: Secondary | ICD-10-CM | POA: Diagnosis not present

## 2018-08-16 DIAGNOSIS — R0602 Shortness of breath: Secondary | ICD-10-CM | POA: Diagnosis not present

## 2018-08-16 DIAGNOSIS — M6281 Muscle weakness (generalized): Secondary | ICD-10-CM | POA: Diagnosis not present

## 2018-08-17 DIAGNOSIS — R262 Difficulty in walking, not elsewhere classified: Secondary | ICD-10-CM | POA: Diagnosis not present

## 2018-08-17 DIAGNOSIS — R488 Other symbolic dysfunctions: Secondary | ICD-10-CM | POA: Diagnosis not present

## 2018-08-17 DIAGNOSIS — M6281 Muscle weakness (generalized): Secondary | ICD-10-CM | POA: Diagnosis not present

## 2018-08-17 DIAGNOSIS — R0602 Shortness of breath: Secondary | ICD-10-CM | POA: Diagnosis not present

## 2018-08-17 DIAGNOSIS — R2681 Unsteadiness on feet: Secondary | ICD-10-CM | POA: Diagnosis not present

## 2018-08-21 DIAGNOSIS — R2681 Unsteadiness on feet: Secondary | ICD-10-CM | POA: Diagnosis not present

## 2018-08-21 DIAGNOSIS — R0602 Shortness of breath: Secondary | ICD-10-CM | POA: Diagnosis not present

## 2018-08-21 DIAGNOSIS — R488 Other symbolic dysfunctions: Secondary | ICD-10-CM | POA: Diagnosis not present

## 2018-08-21 DIAGNOSIS — M6281 Muscle weakness (generalized): Secondary | ICD-10-CM | POA: Diagnosis not present

## 2018-08-21 DIAGNOSIS — R262 Difficulty in walking, not elsewhere classified: Secondary | ICD-10-CM | POA: Diagnosis not present

## 2018-08-22 DIAGNOSIS — R488 Other symbolic dysfunctions: Secondary | ICD-10-CM | POA: Diagnosis not present

## 2018-08-22 DIAGNOSIS — R262 Difficulty in walking, not elsewhere classified: Secondary | ICD-10-CM | POA: Diagnosis not present

## 2018-08-22 DIAGNOSIS — M6281 Muscle weakness (generalized): Secondary | ICD-10-CM | POA: Diagnosis not present

## 2018-08-22 DIAGNOSIS — R0602 Shortness of breath: Secondary | ICD-10-CM | POA: Diagnosis not present

## 2018-08-22 DIAGNOSIS — R2681 Unsteadiness on feet: Secondary | ICD-10-CM | POA: Diagnosis not present

## 2018-08-23 DIAGNOSIS — M6281 Muscle weakness (generalized): Secondary | ICD-10-CM | POA: Diagnosis not present

## 2018-08-23 DIAGNOSIS — R488 Other symbolic dysfunctions: Secondary | ICD-10-CM | POA: Diagnosis not present

## 2018-08-23 DIAGNOSIS — R2681 Unsteadiness on feet: Secondary | ICD-10-CM | POA: Diagnosis not present

## 2018-08-23 DIAGNOSIS — R0602 Shortness of breath: Secondary | ICD-10-CM | POA: Diagnosis not present

## 2018-08-23 DIAGNOSIS — R262 Difficulty in walking, not elsewhere classified: Secondary | ICD-10-CM | POA: Diagnosis not present

## 2018-08-24 DIAGNOSIS — R488 Other symbolic dysfunctions: Secondary | ICD-10-CM | POA: Diagnosis not present

## 2018-08-24 DIAGNOSIS — R0602 Shortness of breath: Secondary | ICD-10-CM | POA: Diagnosis not present

## 2018-08-24 DIAGNOSIS — R2681 Unsteadiness on feet: Secondary | ICD-10-CM | POA: Diagnosis not present

## 2018-08-24 DIAGNOSIS — R262 Difficulty in walking, not elsewhere classified: Secondary | ICD-10-CM | POA: Diagnosis not present

## 2018-08-24 DIAGNOSIS — M6281 Muscle weakness (generalized): Secondary | ICD-10-CM | POA: Diagnosis not present

## 2018-08-27 DIAGNOSIS — M6281 Muscle weakness (generalized): Secondary | ICD-10-CM | POA: Diagnosis not present

## 2018-08-27 DIAGNOSIS — R262 Difficulty in walking, not elsewhere classified: Secondary | ICD-10-CM | POA: Diagnosis not present

## 2018-08-27 DIAGNOSIS — R0602 Shortness of breath: Secondary | ICD-10-CM | POA: Diagnosis not present

## 2018-08-27 DIAGNOSIS — R488 Other symbolic dysfunctions: Secondary | ICD-10-CM | POA: Diagnosis not present

## 2018-08-27 DIAGNOSIS — R2681 Unsteadiness on feet: Secondary | ICD-10-CM | POA: Diagnosis not present

## 2018-08-28 DIAGNOSIS — R262 Difficulty in walking, not elsewhere classified: Secondary | ICD-10-CM | POA: Diagnosis not present

## 2018-08-28 DIAGNOSIS — R488 Other symbolic dysfunctions: Secondary | ICD-10-CM | POA: Diagnosis not present

## 2018-08-28 DIAGNOSIS — R2681 Unsteadiness on feet: Secondary | ICD-10-CM | POA: Diagnosis not present

## 2018-08-28 DIAGNOSIS — M6281 Muscle weakness (generalized): Secondary | ICD-10-CM | POA: Diagnosis not present

## 2018-08-28 DIAGNOSIS — R0602 Shortness of breath: Secondary | ICD-10-CM | POA: Diagnosis not present

## 2018-08-29 DIAGNOSIS — R2681 Unsteadiness on feet: Secondary | ICD-10-CM | POA: Diagnosis not present

## 2018-08-29 DIAGNOSIS — R0602 Shortness of breath: Secondary | ICD-10-CM | POA: Diagnosis not present

## 2018-08-29 DIAGNOSIS — M6281 Muscle weakness (generalized): Secondary | ICD-10-CM | POA: Diagnosis not present

## 2018-08-29 DIAGNOSIS — R488 Other symbolic dysfunctions: Secondary | ICD-10-CM | POA: Diagnosis not present

## 2018-08-29 DIAGNOSIS — R262 Difficulty in walking, not elsewhere classified: Secondary | ICD-10-CM | POA: Diagnosis not present

## 2018-08-30 DIAGNOSIS — R488 Other symbolic dysfunctions: Secondary | ICD-10-CM | POA: Diagnosis not present

## 2018-08-30 DIAGNOSIS — M6281 Muscle weakness (generalized): Secondary | ICD-10-CM | POA: Diagnosis not present

## 2018-08-30 DIAGNOSIS — R0602 Shortness of breath: Secondary | ICD-10-CM | POA: Diagnosis not present

## 2018-08-30 DIAGNOSIS — R262 Difficulty in walking, not elsewhere classified: Secondary | ICD-10-CM | POA: Diagnosis not present

## 2018-08-30 DIAGNOSIS — R2681 Unsteadiness on feet: Secondary | ICD-10-CM | POA: Diagnosis not present

## 2018-08-31 DIAGNOSIS — Z1159 Encounter for screening for other viral diseases: Secondary | ICD-10-CM | POA: Diagnosis not present

## 2018-09-01 ENCOUNTER — Encounter (HOSPITAL_COMMUNITY): Payer: Self-pay | Admitting: Emergency Medicine

## 2018-09-01 ENCOUNTER — Other Ambulatory Visit: Payer: Self-pay

## 2018-09-01 ENCOUNTER — Emergency Department (HOSPITAL_COMMUNITY): Payer: Medicare Other

## 2018-09-01 ENCOUNTER — Inpatient Hospital Stay (HOSPITAL_COMMUNITY)
Admission: EM | Admit: 2018-09-01 | Discharge: 2018-09-10 | DRG: 871 | Disposition: A | Discharge status: Skilled Nursing Facility | Payer: Medicare Other | Source: Skilled Nursing Facility | Attending: Internal Medicine | Admitting: Internal Medicine

## 2018-09-01 DIAGNOSIS — J189 Pneumonia, unspecified organism: Secondary | ICD-10-CM | POA: Diagnosis present

## 2018-09-01 DIAGNOSIS — G9341 Metabolic encephalopathy: Secondary | ICD-10-CM | POA: Diagnosis not present

## 2018-09-01 DIAGNOSIS — Z20828 Contact with and (suspected) exposure to other viral communicable diseases: Secondary | ICD-10-CM | POA: Diagnosis not present

## 2018-09-01 DIAGNOSIS — R0602 Shortness of breath: Secondary | ICD-10-CM | POA: Diagnosis not present

## 2018-09-01 DIAGNOSIS — Z9861 Coronary angioplasty status: Secondary | ICD-10-CM

## 2018-09-01 DIAGNOSIS — Z952 Presence of prosthetic heart valve: Secondary | ICD-10-CM

## 2018-09-01 DIAGNOSIS — I517 Cardiomegaly: Secondary | ICD-10-CM | POA: Diagnosis not present

## 2018-09-01 DIAGNOSIS — Z79899 Other long term (current) drug therapy: Secondary | ICD-10-CM

## 2018-09-01 DIAGNOSIS — I10 Essential (primary) hypertension: Secondary | ICD-10-CM | POA: Diagnosis present

## 2018-09-01 DIAGNOSIS — I2510 Atherosclerotic heart disease of native coronary artery without angina pectoris: Secondary | ICD-10-CM | POA: Diagnosis present

## 2018-09-01 DIAGNOSIS — Z9181 History of falling: Secondary | ICD-10-CM | POA: Diagnosis not present

## 2018-09-01 DIAGNOSIS — L03113 Cellulitis of right upper limb: Secondary | ICD-10-CM | POA: Diagnosis present

## 2018-09-01 DIAGNOSIS — R404 Transient alteration of awareness: Secondary | ICD-10-CM | POA: Diagnosis not present

## 2018-09-01 DIAGNOSIS — R069 Unspecified abnormalities of breathing: Secondary | ICD-10-CM | POA: Diagnosis not present

## 2018-09-01 DIAGNOSIS — Z7982 Long term (current) use of aspirin: Secondary | ICD-10-CM

## 2018-09-01 DIAGNOSIS — R0689 Other abnormalities of breathing: Secondary | ICD-10-CM | POA: Diagnosis not present

## 2018-09-01 DIAGNOSIS — S0990XA Unspecified injury of head, initial encounter: Secondary | ICD-10-CM | POA: Diagnosis not present

## 2018-09-01 DIAGNOSIS — I4891 Unspecified atrial fibrillation: Secondary | ICD-10-CM | POA: Diagnosis not present

## 2018-09-01 DIAGNOSIS — L0390 Cellulitis, unspecified: Secondary | ICD-10-CM | POA: Diagnosis present

## 2018-09-01 DIAGNOSIS — F0390 Unspecified dementia without behavioral disturbance: Secondary | ICD-10-CM | POA: Diagnosis present

## 2018-09-01 DIAGNOSIS — Z66 Do not resuscitate: Secondary | ICD-10-CM | POA: Diagnosis present

## 2018-09-01 DIAGNOSIS — A419 Sepsis, unspecified organism: Secondary | ICD-10-CM | POA: Diagnosis present

## 2018-09-01 DIAGNOSIS — Z87891 Personal history of nicotine dependence: Secondary | ICD-10-CM

## 2018-09-01 DIAGNOSIS — Z951 Presence of aortocoronary bypass graft: Secondary | ICD-10-CM

## 2018-09-01 DIAGNOSIS — M7989 Other specified soft tissue disorders: Secondary | ICD-10-CM | POA: Diagnosis not present

## 2018-09-01 DIAGNOSIS — I480 Paroxysmal atrial fibrillation: Secondary | ICD-10-CM | POA: Diagnosis present

## 2018-09-01 DIAGNOSIS — S199XXA Unspecified injury of neck, initial encounter: Secondary | ICD-10-CM | POA: Diagnosis not present

## 2018-09-01 DIAGNOSIS — S79912A Unspecified injury of left hip, initial encounter: Secondary | ICD-10-CM | POA: Diagnosis not present

## 2018-09-01 DIAGNOSIS — Z8619 Personal history of other infectious and parasitic diseases: Secondary | ICD-10-CM

## 2018-09-01 DIAGNOSIS — E785 Hyperlipidemia, unspecified: Secondary | ICD-10-CM | POA: Diagnosis present

## 2018-09-01 DIAGNOSIS — S79911A Unspecified injury of right hip, initial encounter: Secondary | ICD-10-CM | POA: Diagnosis not present

## 2018-09-01 DIAGNOSIS — R5381 Other malaise: Secondary | ICD-10-CM | POA: Diagnosis present

## 2018-09-01 DIAGNOSIS — R0902 Hypoxemia: Secondary | ICD-10-CM | POA: Diagnosis not present

## 2018-09-01 DIAGNOSIS — M25431 Effusion, right wrist: Secondary | ICD-10-CM | POA: Diagnosis present

## 2018-09-01 DIAGNOSIS — Z880 Allergy status to penicillin: Secondary | ICD-10-CM

## 2018-09-01 LAB — URINALYSIS, ROUTINE W REFLEX MICROSCOPIC
Bilirubin Urine: NEGATIVE
Glucose, UA: NEGATIVE mg/dL
Ketones, ur: 20 mg/dL — AB
Leukocytes,Ua: NEGATIVE
Nitrite: NEGATIVE
Protein, ur: 30 mg/dL — AB
Specific Gravity, Urine: 1.027 (ref 1.005–1.030)
pH: 5 (ref 5.0–8.0)

## 2018-09-01 LAB — CBC WITH DIFFERENTIAL/PLATELET
Abs Immature Granulocytes: 0.07 10*3/uL (ref 0.00–0.07)
Basophils Absolute: 0 10*3/uL (ref 0.0–0.1)
Basophils Relative: 0 %
Eosinophils Absolute: 0 10*3/uL (ref 0.0–0.5)
Eosinophils Relative: 0 %
HCT: 40.9 % (ref 39.0–52.0)
Hemoglobin: 13.8 g/dL (ref 13.0–17.0)
Immature Granulocytes: 1 %
Lymphocytes Relative: 10 %
Lymphs Abs: 1.5 10*3/uL (ref 0.7–4.0)
MCH: 33.3 pg (ref 26.0–34.0)
MCHC: 33.7 g/dL (ref 30.0–36.0)
MCV: 98.6 fL (ref 80.0–100.0)
Monocytes Absolute: 1.7 10*3/uL — ABNORMAL HIGH (ref 0.1–1.0)
Monocytes Relative: 11 %
Neutro Abs: 11.9 10*3/uL — ABNORMAL HIGH (ref 1.7–7.7)
Neutrophils Relative %: 78 %
Platelets: 184 10*3/uL (ref 150–400)
RBC: 4.15 MIL/uL — ABNORMAL LOW (ref 4.22–5.81)
RDW: 12.5 % (ref 11.5–15.5)
WBC: 15.2 10*3/uL — ABNORMAL HIGH (ref 4.0–10.5)
nRBC: 0 % (ref 0.0–0.2)

## 2018-09-01 LAB — COMPREHENSIVE METABOLIC PANEL
ALT: 13 U/L (ref 0–44)
AST: 20 U/L (ref 15–41)
Albumin: 3.2 g/dL — ABNORMAL LOW (ref 3.5–5.0)
Alkaline Phosphatase: 64 U/L (ref 38–126)
Anion gap: 12 (ref 5–15)
BUN: 20 mg/dL (ref 8–23)
CO2: 21 mmol/L — ABNORMAL LOW (ref 22–32)
Calcium: 8.6 mg/dL — ABNORMAL LOW (ref 8.9–10.3)
Chloride: 100 mmol/L (ref 98–111)
Creatinine, Ser: 0.86 mg/dL (ref 0.61–1.24)
GFR calc Af Amer: >60 mL/min (ref >60)
GFR calc non Af Amer: >60 mL/min (ref >60)
Glucose, Bld: 111 mg/dL — ABNORMAL HIGH (ref 70–99)
Potassium: 4 mmol/L (ref 3.5–5.1)
Sodium: 133 mmol/L — ABNORMAL LOW (ref 135–145)
Total Bilirubin: 1.2 mg/dL (ref 0.3–1.2)
Total Protein: 6.9 g/dL (ref 6.5–8.1)

## 2018-09-01 LAB — PROTIME-INR
INR: 1.1 (ref 0.8–1.2)
Prothrombin Time: 13.6 seconds (ref 11.4–15.2)

## 2018-09-01 LAB — APTT: aPTT: 22 seconds — ABNORMAL LOW (ref 24–36)

## 2018-09-01 LAB — SARS CORONAVIRUS 2 BY RT PCR (HOSPITAL ORDER, PERFORMED IN ~~LOC~~ HOSPITAL LAB): SARS Coronavirus 2: NEGATIVE

## 2018-09-01 LAB — LACTIC ACID, PLASMA: Lactic Acid, Venous: 1.7 mmol/L (ref 0.5–1.9)

## 2018-09-01 LAB — CBG MONITORING, ED: Glucose-Capillary: 94 mg/dL (ref 70–99)

## 2018-09-01 MED ORDER — METRONIDAZOLE IN NACL 5-0.79 MG/ML-% IV SOLN
500.0000 mg | Freq: Once | INTRAVENOUS | Status: AC
  Administered 2018-09-01: 500 mg via INTRAVENOUS
  Filled 2018-09-01: qty 100

## 2018-09-01 MED ORDER — SODIUM CHLORIDE 0.9 % IV BOLUS
500.0000 mL | Freq: Once | INTRAVENOUS | Status: AC
  Administered 2018-09-01: 22:00:00 500 mL via INTRAVENOUS

## 2018-09-01 MED ORDER — VANCOMYCIN HCL IN DEXTROSE 1-5 GM/200ML-% IV SOLN: 1000.0000 mg | Freq: Once | INTRAVENOUS | Status: DC

## 2018-09-01 MED ORDER — VANCOMYCIN HCL 10 G IV SOLR
1500.0000 mg | Freq: Once | INTRAVENOUS | Status: AC
  Administered 2018-09-01: 1500 mg via INTRAVENOUS
  Filled 2018-09-01: qty 1500

## 2018-09-01 MED ORDER — SODIUM CHLORIDE 0.9 % IV SOLN
2.0000 g | Freq: Once | INTRAVENOUS | Status: AC
  Administered 2018-09-01: 22:00:00 2 g via INTRAVENOUS
  Filled 2018-09-01: qty 2

## 2018-09-01 NOTE — Progress Notes (Signed)

## 2018-09-01 NOTE — ED Triage Notes (Signed)
Patient brought in by [facility]. Patient has been running fever for the past two days. Hx of dementia. Heavy breathing and rr-30. Room air at 96%. Right wrist warm to touch and swollen. Patient had a fall last week.

## 2018-09-01 NOTE — ED Provider Notes (Signed)
[facility] DEPT Provider Note   CSN: [id number] Arrival date & time: 09/01/18  2055    History   Chief Complaint Chief Complaint  Patient presents with  . Shortness of Breath  . Fever    HPI Marvin Wagner is a 85 y.o. male.     Patient is a 85 year old male who presents from [facility] facility with fever.  Per EMS report, patient who has a history of dementia has been running a fever for the last 2 days.  He is noted to have a decline in his mental status and has been breathing heavier than normal.  He was noted to have swelling to his right wrist.  He was reported to have a fall last week although the circumstances of the fall are unknown.  History is limited due to patient's dementia.  Patient was admitted at the end of May for pneumonia and COVID infection.  He had a positive Covid test on May 19.  He subsequently had 2- test prior to return to the nursing home.     Past Medical History:  Diagnosis Date  . Aortic insufficiency and aortic stenosis   . Aortic stenosis    SEVERE  . CAD (coronary artery disease)   . Heart murmur   . Hyperlipidemia   . Hypertension   . Mild memory disturbance     Patient Active Problem List   Diagnosis Date Noted  . Community acquired pneumonia 06/19/2018  . Acute encephalopathy 06/19/2018  . CAP (community acquired pneumonia) 06/19/2018  . Acute blood loss anemia 02/27/2016  . UTI (urinary tract infection) 02/27/2016  . Pressure injury of skin 02/25/2016  . Hematoma of chest wall, left, initial encounter 02/24/2016  . Fever 02/24/2016  . Supratherapeutic INR 02/24/2016  . Dementia ([place]) 03/04/2015  . Hypotension 12/03/2014  . Weight loss, unintentional 10/17/2013  . History of elevated PSA 04/25/2013  . Encounter for therapeutic drug monitoring 02/26/2013  . Long term current use of anticoagulant therapy 12/19/2012  . Other vitamin B12 deficiency anemia 02/29/2012  . Memory disorder  02/14/2012  . Hx of CABG 07/01/2010  . Hyperlipidemia 07/01/2010  . Benign hypertensive heart disease without heart failure 07/01/2010    Past Surgical History:  Procedure Laterality Date  . AORTIC VALVE REPLACEMENT  12/17/08  . CARDIAC CATHETERIZATION    . CORONARY ANGIOPLASTY     NORMAL LEFT VENTRICULAR SIZEAND CONTRACTILITY WITH NORMAL SYSTOLIC FUNCTION. EF 65%  . TONSILLECTOMY          Home Medications    Prior to Admission medications   Medication Sig Start Date End Date Taking? Authorizing Provider  acetaminophen (TYLENOL) 325 MG tablet Take 2 tablets (650 mg total) by mouth every 6 (six) hours as needed for mild pain, moderate pain, fever or headache (or Fever >/= 101). 07/05/18   Elgergawy, Silver Huguenin, MD  aspirin EC 81 MG tablet Take 81 mg by mouth daily.    [provider]  atorvastatin (LIPITOR) 40 MG tablet Take 1 tablet (40 mg total) daily by mouth. 12/12/16   Burchette, Alinda Sierras, MD  [employer] TUSSIN DM 200-20 MG/10ML liquid Take 10 mLs by mouth every 6 (six) hours as needed for cough or congestion. 05/05/18   [provider]  donepezil (ARICEPT) 10 MG tablet TAKE 1 TABLET (10 MG TOTAL) BY MOUTH AT BEDTIME. 12/28/15   Burchette, Alinda Sierras, MD  Melatonin 3 MG TABS Take 3 mg by mouth at bedtime.    [provider]  memantine (NAMENDA) 10 MG tablet Take 1 tablet (10 mg total) by mouth 2 (two) times daily. TAKE   1 TABLET (10MG ) BY MOUTH TWICE DAILY Patient taking differently: Take 10 mg by mouth 2 (two) times daily.  02/24/17   Burchette, Alinda Sierras, MD  metoprolol tartrate (LOPRESSOR) 25 MG tablet TAKE ONE-HALF TABLETS (12.5 MG TOTAL) BY MOUTH DAILY. Patient taking differently: Take 12.5 mg by mouth daily.  11/11/16   Burchette, Alinda Sierras, MD  Multiple Vitamins-Minerals (CENTRUM SILVER 50+MEN) TABS Take 1 tablet by mouth daily.    [provider]  ondansetron (ZOFRAN ODT) 8 MG disintegrating tablet Take 1 tablet (8 mg total) by mouth every 8 (eight) hours  as needed for nausea or vomiting. Patient not taking: Reported on 09/22/2017 09/21/17   Molpus, Jenny Reichmann, MD  risperiDONE (RISPERDAL) 0.25 MG tablet Take 1 tablet (0.25 mg total) by mouth 2 (two) times daily as needed (anxiety). 07/05/18   Elgergawy, Silver Huguenin, MD    Family History Family History  Problem Relation Age of Onset  . Ankylosing spondylitis Brother     Social History Social History   Tobacco Use  . Smoking status: Former Smoker    Quit date: 06/30/2006    Years since quitting: 12.1  . Smokeless tobacco: Never Used  Substance Use Topics  . Alcohol use: No  . Drug use: No     Allergies   Penicillins   Review of Systems Review of Systems  Unable to perform ROS: Dementia     Physical Exam Updated Vital Signs BP (!) 144/87 (BP Location: Right Arm)   Pulse (!) 120   Temp (!) 100.5 F (38.1 C) (Rectal)   Resp (!) 27   Ht 6' (1.829 m)   Wt 77.1 kg   SpO2 99%   BMI 23.06 kg/m   Physical Exam Constitutional:      Comments: Patient is laying in the bed with his eyes closed but will open his eyes to painful stimuli  HENT:     Head: Normocephalic and atraumatic.  Eyes:     Pupils: Pupils are equal, round, and reactive to light.  Cardiovascular:     Rate and Rhythm: Tachycardia present.  Pulmonary:     Effort: Tachypnea present.     Breath sounds: Rhonchi present.  Abdominal:     Palpations: Abdomen is soft.     Tenderness: There is no abdominal tenderness. There is no guarding.  Musculoskeletal:     Comments: Patient has swelling of his right hand and wrist with warmth and erythema to the area.  He has pain on palpation of this area.  No wounds are noted.  No sores.  He also has pain on range of motion of both of his hips.  No obvious deformity is noted.  No other obvious discomfort on range of motion of the other extremities.  Neurological:     Comments: Patient is not verbally responsive.  He will open his eyes to painful stimuli.  He is not following  commands.      ED Treatments / Results  Labs (all labs ordered are listed, but only abnormal results are displayed) Labs Reviewed  COMPREHENSIVE METABOLIC PANEL - Abnormal; Notable for the following components:      Result Value   Sodium 133 (*)    CO2 21 (*)    Glucose, Bld 111 (*)    Calcium 8.6 (*)    Albumin 3.2 (*)    All other components  within normal limits  CBC WITH DIFFERENTIAL/PLATELET - Abnormal; Notable for the following components:   WBC 15.2 (*)    RBC 4.15 (*)    Neutro Abs 11.9 (*)    Monocytes Absolute 1.7 (*)    All other components within normal limits  APTT - Abnormal; Notable for the following components:   aPTT 22 (*)    All other components within normal limits  URINALYSIS, ROUTINE W REFLEX MICROSCOPIC - Abnormal; Notable for the following components:   Color, Urine AMBER (*)    APPearance HAZY (*)    Hgb urine dipstick MODERATE (*)    Ketones, ur 20 (*)    Protein, ur 30 (*)    Bacteria, UA RARE (*)    All other components within normal limits  SARS CORONAVIRUS 2 (HOSPITAL ORDER, [place] [facility])  CULTURE, BLOOD (ROUTINE X 2)  CULTURE, BLOOD (ROUTINE X 2)  URINE CULTURE  LACTIC ACID, PLASMA  PROTIME-INR  LACTIC ACID, PLASMA  CBG MONITORING, ED    EKG None  Radiology Dg Wrist Complete Right  Result Date: 09/01/2018 CLINICAL DATA:  Initial evaluation for acute warmth and swelling about the right wrist. History of recent fall. EXAM: RIGHT WRIST - COMPLETE 3+ VIEW COMPARISON:  None. FINDINGS: No acute fracture or dislocation. Normal radiocarpal and distal radioulnar articulations maintained. Mild degenerative changes with chondrocalcinosis noted about the wrist. Prominent osteoarthritic changes noted at the first [facility] joint. Mild diffuse soft tissue swelling about the visualized right hand and wrist. IMPRESSION: 1. No acute osseous abnormality. 2. Mild diffuse soft tissue swelling about the visualized right hand and wrist. 3.  Moderate degenerative osteoarthritic changes, most notable at the first [facility] joint. Electronically Signed   By: Jeannine Boga M.D.   On: 09/01/2018 22:57   Dg Chest Port 1 View  Result Date: 09/01/2018 CLINICAL DATA:  Shortness of breath EXAM: PORTABLE CHEST 1 VIEW COMPARISON:  06/18/2018 FINDINGS: Prior median sternotomy and valve replacement. Mild cardiomegaly. Aortic atherosclerosis. No confluent opacity, effusion or edema. No acute bony abnormality. IMPRESSION: No active disease. Electronically Signed   By: Rolm Baptise M.D.   On: 09/01/2018 22:54    Procedures Procedures (including critical care time)  Medications Ordered in ED Medications  vancomycin (VANCOCIN) 1,500 mg in sodium chloride 0.9 % 500 mL IVPB (has no administration in time range)  ceFEPIme (MAXIPIME) 2 g in sodium chloride 0.9 % 100 mL IVPB (2 g Intravenous New Bag/Given 09/01/18 2205)  metroNIDAZOLE (FLAGYL) IVPB 500 mg (500 mg Intravenous New Bag/Given 09/01/18 2201)  sodium chloride 0.9 % bolus 500 mL (500 mLs Intravenous New Bag/Given 09/01/18 2205)     Initial Impression / Assessment and Plan / ED Course  I have reviewed the triage vital signs and the nursing notes.  Pertinent labs & imaging results that were available during my care of the patient were reviewed by me and considered in my medical decision making (see chart for details).        Patient is 85 year old male who presents from nursing home with febrile illness and diminished mental status.  Is unclear the definitive cause of his fever although he does have some concern for cellulitis in his right hand/wrist.  Underlying x-ray does not show any bony injury.  Chest x-ray is clear without evidence of pneumonia although he is fairly rhonchorous on exam.  His labs show an elevated white count.  His lactate is normal.  His Covid test is negative.  He is awaiting  his CT results and will need admission for further treatment.  He was given IV antibiotics and  fluids.  Dr. Stark Jock to take over care.  CRITICAL CARE Performed by: Malvin Johns Total critical care time: 45 minutes Critical care time was exclusive of separately billable procedures and treating other patients. Critical care was necessary to treat or prevent imminent or life-threatening deterioration. Critical care was time spent personally by me on the following activities: development of treatment plan with patient and/or surrogate as well as nursing, discussions with consultants, evaluation of patient's response to treatment, examination of patient, obtaining history from patient or surrogate, ordering and performing treatments and interventions, ordering and review of laboratory studies, ordering and review of radiographic studies, pulse oximetry and re-evaluation of patient's condition.   Final Clinical Impressions(s) / ED Diagnoses   Final diagnoses:  Sepsis, due to unspecified organism, unspecified whether acute organ dysfunction present [facility])  Cellulitis of right upper extremity    ED Discharge Orders    None       Malvin Johns, MD 09/01/18 2304

## 2018-09-02 ENCOUNTER — Inpatient Hospital Stay (HOSPITAL_COMMUNITY): Payer: Medicare Other

## 2018-09-02 ENCOUNTER — Encounter (HOSPITAL_COMMUNITY): Payer: Self-pay | Admitting: Internal Medicine

## 2018-09-02 DIAGNOSIS — G9341 Metabolic encephalopathy: Secondary | ICD-10-CM | POA: Diagnosis present

## 2018-09-02 DIAGNOSIS — F0280 Dementia in other diseases classified elsewhere without behavioral disturbance: Secondary | ICD-10-CM | POA: Diagnosis not present

## 2018-09-02 DIAGNOSIS — Z79899 Other long term (current) drug therapy: Secondary | ICD-10-CM | POA: Diagnosis not present

## 2018-09-02 DIAGNOSIS — R509 Fever, unspecified: Secondary | ICD-10-CM | POA: Diagnosis not present

## 2018-09-02 DIAGNOSIS — L03113 Cellulitis of right upper limb: Secondary | ICD-10-CM | POA: Diagnosis present

## 2018-09-02 DIAGNOSIS — M25531 Pain in right wrist: Secondary | ICD-10-CM | POA: Diagnosis not present

## 2018-09-02 DIAGNOSIS — Z952 Presence of prosthetic heart valve: Secondary | ICD-10-CM | POA: Diagnosis not present

## 2018-09-02 DIAGNOSIS — Z8619 Personal history of other infectious and parasitic diseases: Secondary | ICD-10-CM | POA: Diagnosis not present

## 2018-09-02 DIAGNOSIS — A419 Sepsis, unspecified organism: Secondary | ICD-10-CM | POA: Diagnosis present

## 2018-09-02 DIAGNOSIS — M2550 Pain in unspecified joint: Secondary | ICD-10-CM | POA: Diagnosis not present

## 2018-09-02 DIAGNOSIS — Z20828 Contact with and (suspected) exposure to other viral communicable diseases: Secondary | ICD-10-CM | POA: Diagnosis present

## 2018-09-02 DIAGNOSIS — Z9861 Coronary angioplasty status: Secondary | ICD-10-CM | POA: Diagnosis not present

## 2018-09-02 DIAGNOSIS — Z951 Presence of aortocoronary bypass graft: Secondary | ICD-10-CM | POA: Diagnosis not present

## 2018-09-02 DIAGNOSIS — S299XXA Unspecified injury of thorax, initial encounter: Secondary | ICD-10-CM | POA: Diagnosis not present

## 2018-09-02 DIAGNOSIS — R5381 Other malaise: Secondary | ICD-10-CM | POA: Diagnosis present

## 2018-09-02 DIAGNOSIS — G309 Alzheimer's disease, unspecified: Secondary | ICD-10-CM | POA: Diagnosis not present

## 2018-09-02 DIAGNOSIS — Z880 Allergy status to penicillin: Secondary | ICD-10-CM | POA: Diagnosis not present

## 2018-09-02 DIAGNOSIS — I2510 Atherosclerotic heart disease of native coronary artery without angina pectoris: Secondary | ICD-10-CM | POA: Diagnosis present

## 2018-09-02 DIAGNOSIS — Z66 Do not resuscitate: Secondary | ICD-10-CM | POA: Diagnosis present

## 2018-09-02 DIAGNOSIS — M25431 Effusion, right wrist: Secondary | ICD-10-CM | POA: Diagnosis present

## 2018-09-02 DIAGNOSIS — E785 Hyperlipidemia, unspecified: Secondary | ICD-10-CM | POA: Diagnosis present

## 2018-09-02 DIAGNOSIS — F0390 Unspecified dementia without behavioral disturbance: Secondary | ICD-10-CM | POA: Diagnosis present

## 2018-09-02 DIAGNOSIS — R0602 Shortness of breath: Secondary | ICD-10-CM | POA: Diagnosis not present

## 2018-09-02 DIAGNOSIS — L0390 Cellulitis, unspecified: Secondary | ICD-10-CM | POA: Diagnosis present

## 2018-09-02 DIAGNOSIS — Z7401 Bed confinement status: Secondary | ICD-10-CM | POA: Diagnosis not present

## 2018-09-02 DIAGNOSIS — S6991XA Unspecified injury of right wrist, hand and finger(s), initial encounter: Secondary | ICD-10-CM | POA: Diagnosis not present

## 2018-09-02 DIAGNOSIS — J189 Pneumonia, unspecified organism: Secondary | ICD-10-CM | POA: Diagnosis present

## 2018-09-02 DIAGNOSIS — Z87891 Personal history of nicotine dependence: Secondary | ICD-10-CM | POA: Diagnosis not present

## 2018-09-02 DIAGNOSIS — I480 Paroxysmal atrial fibrillation: Secondary | ICD-10-CM | POA: Diagnosis present

## 2018-09-02 DIAGNOSIS — Z7982 Long term (current) use of aspirin: Secondary | ICD-10-CM | POA: Diagnosis not present

## 2018-09-02 DIAGNOSIS — I10 Essential (primary) hypertension: Secondary | ICD-10-CM | POA: Diagnosis present

## 2018-09-02 LAB — PROCALCITONIN: Procalcitonin: 0.75 ng/mL

## 2018-09-02 LAB — CBC
HCT: 33.9 % — ABNORMAL LOW (ref 39.0–52.0)
Hemoglobin: 11.1 g/dL — ABNORMAL LOW (ref 13.0–17.0)
MCH: 32.5 pg (ref 26.0–34.0)
MCHC: 32.7 g/dL (ref 30.0–36.0)
MCV: 99.1 fL (ref 80.0–100.0)
Platelets: 195 10*3/uL (ref 150–400)
RBC: 3.42 MIL/uL — ABNORMAL LOW (ref 4.22–5.81)
RDW: 12.6 % (ref 11.5–15.5)
WBC: 12.4 10*3/uL — ABNORMAL HIGH (ref 4.0–10.5)
nRBC: 0 % (ref 0.0–0.2)

## 2018-09-02 LAB — COMPREHENSIVE METABOLIC PANEL
ALT: 10 U/L (ref 0–44)
AST: 13 U/L — ABNORMAL LOW (ref 15–41)
Albumin: 2.6 g/dL — ABNORMAL LOW (ref 3.5–5.0)
Alkaline Phosphatase: 50 U/L (ref 38–126)
Anion gap: 10 (ref 5–15)
BUN: 19 mg/dL (ref 8–23)
CO2: 22 mmol/L (ref 22–32)
Calcium: 7.7 mg/dL — ABNORMAL LOW (ref 8.9–10.3)
Chloride: 105 mmol/L (ref 98–111)
Creatinine, Ser: 0.79 mg/dL (ref 0.61–1.24)
GFR calc Af Amer: >60 mL/min (ref >60)
GFR calc non Af Amer: >60 mL/min (ref >60)
Glucose, Bld: 103 mg/dL — ABNORMAL HIGH (ref 70–99)
Potassium: 3.6 mmol/L (ref 3.5–5.1)
Sodium: 137 mmol/L (ref 135–145)
Total Bilirubin: 0.9 mg/dL (ref 0.3–1.2)
Total Protein: 5.5 g/dL — ABNORMAL LOW (ref 6.5–8.1)

## 2018-09-02 LAB — SARS CORONAVIRUS 2 BY RT PCR (HOSPITAL ORDER, PERFORMED IN ~~LOC~~ HOSPITAL LAB): SARS Coronavirus 2: NEGATIVE

## 2018-09-02 LAB — BLOOD CULTURE ID PANEL (REFLEXED)

## 2018-09-02 LAB — MRSA PCR SCREENING: MRSA by PCR: NEGATIVE

## 2018-09-02 MED ORDER — METOPROLOL SUCCINATE ER 25 MG PO TB24
12.5000 mg | ORAL_TABLET | Freq: Every day | ORAL | Status: DC
  Administered 2018-09-02 – 2018-09-04 (×3): 12.5 mg via ORAL
  Filled 2018-09-02 (×4): qty 1

## 2018-09-02 MED ORDER — POLYETHYLENE GLYCOL 3350 17 G PO PACK
17.0000 g | PACK | Freq: Every day | ORAL | Status: AC
  Administered 2018-09-02 – 2018-09-03 (×2): 17 g via ORAL
  Filled 2018-09-02 (×2): qty 1

## 2018-09-02 MED ORDER — RISPERIDONE 0.25 MG PO TABS
0.2500 mg | ORAL_TABLET | Freq: Two times a day (BID) | ORAL | Status: DC
  Administered 2018-09-02 – 2018-09-10 (×16): 0.25 mg via ORAL
  Filled 2018-09-02 (×17): qty 1

## 2018-09-02 MED ORDER — ENOXAPARIN SODIUM 40 MG/0.4ML ~~LOC~~ SOLN
40.0000 mg | SUBCUTANEOUS | Status: DC
  Administered 2018-09-02 – 2018-09-10 (×9): 40 mg via SUBCUTANEOUS
  Filled 2018-09-02 (×9): qty 0.4

## 2018-09-02 MED ORDER — MELATONIN 3 MG PO TABS
3.0000 mg | ORAL_TABLET | Freq: Every day | ORAL | Status: DC
  Administered 2018-09-02 – 2018-09-09 (×8): 3 mg via ORAL
  Filled 2018-09-02 (×9): qty 1

## 2018-09-02 MED ORDER — VANCOMYCIN HCL 10 G IV SOLR
1750.0000 mg | INTRAVENOUS | Status: DC
  Administered 2018-09-02: 1750 mg via INTRAVENOUS
  Filled 2018-09-02 (×2): qty 1750

## 2018-09-02 MED ORDER — ASPIRIN EC 81 MG PO TBEC
81.0000 mg | DELAYED_RELEASE_TABLET | Freq: Every day | ORAL | Status: DC
  Administered 2018-09-02 – 2018-09-10 (×8): 81 mg via ORAL
  Filled 2018-09-02 (×9): qty 1

## 2018-09-02 MED ORDER — SODIUM CHLORIDE (PF) 0.9 % IJ SOLN
INTRAMUSCULAR | Status: AC
  Filled 2018-09-02: qty 50

## 2018-09-02 MED ORDER — ACETAMINOPHEN 325 MG PO TABS
650.0000 mg | ORAL_TABLET | Freq: Four times a day (QID) | ORAL | Status: DC | PRN
  Administered 2018-09-03 – 2018-09-10 (×7): 650 mg via ORAL
  Filled 2018-09-02 (×7): qty 2

## 2018-09-02 MED ORDER — SODIUM CHLORIDE 0.9 % IV SOLN
INTRAVENOUS | Status: DC | PRN
  Administered 2018-09-02: 15:00:00 1000 mL via INTRAVENOUS

## 2018-09-02 MED ORDER — SODIUM CHLORIDE 0.9 % IV SOLN
2.0000 g | Freq: Three times a day (TID) | INTRAVENOUS | Status: DC
  Administered 2018-09-02 – 2018-09-03 (×5): 2 g via INTRAVENOUS
  Filled 2018-09-02 (×6): qty 2

## 2018-09-02 MED ORDER — IOHEXOL 350 MG/ML SOLN
100.0000 mL | Freq: Once | INTRAVENOUS | Status: AC | PRN
  Administered 2018-09-02: 100 mL via INTRAVENOUS

## 2018-09-02 MED ORDER — ACETAMINOPHEN 650 MG RE SUPP
650.0000 mg | Freq: Four times a day (QID) | RECTAL | Status: DC | PRN
  Administered 2018-09-02: 650 mg via RECTAL
  Filled 2018-09-02: qty 1

## 2018-09-02 MED ORDER — SODIUM CHLORIDE 0.9 % IV SOLN
INTRAVENOUS | Status: AC
  Administered 2018-09-02: 01:00:00 via INTRAVENOUS

## 2018-09-02 NOTE — ED Notes (Signed)
Tech taking patient up 

## 2018-09-02 NOTE — H&P (Signed)
TRH H&P    Patient Demographics:    Marvin Wagner, is a 85 y.o. male  MRN: [id number]  DOB - Aug 27, 1930  Admit Date - 09/01/2018  Referring MD/NP/PA:  [name]  Outpatient Primary MD for the patient is Burchette, Alinda Sierras, MD [facility]  Patient coming from:  ? Memory care  Chief complaint-  Fever x 2 days   HPI:    Marvin Wagner  is a 85 y.o. male,  w CAD s/p CABG, h/o Aortic stenosis s/p AVR,  Dementia, apparently presents with fever x2 days,  Pt is unable to give any meaningful history.  Pt apparently fell sometime last week.  Right wrist slightly swollen.   In ED,  T 100.5, P 129 R 23, Bp 144/87  Pox 91% on RA Wt 77.1, kg  R wrist IMPRESSION: 1. No acute osseous abnormality. 2. Mild diffuse soft tissue swelling about the visualized right hand and wrist. 3. Moderate degenerative osteoarthritic changes, most notable at the first [facility] joint.  CXR IMPRESSION: No active disease.  Xray Hip/ pelvis IMPRESSION: 1. No acute osseous abnormality. 2. Mild osteoarthritic changes about the hips bilaterally. 3. Large volume retained stool impacted within the rectal vault, suggesting constipation.  CT brain IMPRESSION: 1. No acute intracranial abnormality. Advanced chronic small vessel ischemic disease and brain atrophy. 2. No evidence for cervical spine fracture. Multi level cervical spondylosis noted.  covid -19 negative  Urinalysis rbc 11-20, wbc 0-5  Na 133, K 4.0, Bun 20, Creatinine 0.86 Ast 20, Alt 13 Wbc 15.2, Hgb 13.8, Plt 184  INR 1.1  Pt will be admitted for fever secondary to sepsis secondary to cellulitis of right wrist    Review of systems:    In addition to the HPI above, unable to obtain clearly due to dementia   No Headache, No changes with Vision or hearing, No problems swallowing food or Liquids, No Chest pain, Cough or Shortness of Breath, No  Abdominal pain, No Nausea or Vomiting, bowel movements are regular, No Blood in stool or Urine, No dysuria, No new skin rashes or bruises, No new joints pains-aches,  No new weakness, tingling, numbness in any extremity, No recent weight gain or loss, No polyuria, polydypsia or polyphagia, No significant Mental Stressors.  All other systems reviewed and are negative.    Past History of the following :    Past Medical History:  Diagnosis Date   Aortic insufficiency and aortic stenosis    Aortic stenosis    SEVERE   CAD (coronary artery disease)    Heart murmur    Hyperlipidemia    Hypertension    Mild memory disturbance       Past Surgical History:  Procedure Laterality Date   AORTIC VALVE REPLACEMENT  12/17/08   CARDIAC CATHETERIZATION     CORONARY ANGIOPLASTY     NORMAL LEFT VENTRICULAR SIZEAND CONTRACTILITY WITH NORMAL SYSTOLIC FUNCTION. EF 65%   TONSILLECTOMY        Social History:      Social History   Tobacco  Use   Smoking status: Former Smoker    Quit date: 06/30/2006    Years since quitting: 12.1   Smokeless tobacco: Never Used  Substance Use Topics   Alcohol use: No       Family History :     Family History  Problem Relation Age of Onset   Ankylosing spondylitis Brother        Home Medications:   Prior to Admission medications   Medication Sig Start Date End Date Taking? Authorizing Provider  acetaminophen (TYLENOL) 325 MG tablet Take 2 tablets (650 mg total) by mouth every 6 (six) hours as needed for mild pain, moderate pain, fever or headache (or Fever >/= 101). 07/05/18   Elgergawy, Silver Huguenin, MD  aspirin EC 81 MG tablet Take 81 mg by mouth daily.    [provider]  atorvastatin (LIPITOR) 40 MG tablet Take 1 tablet (40 mg total) daily by mouth. 12/12/16   Burchette, Alinda Sierras, MD  [facility] TUSSIN DM 200-20 MG/10ML liquid Take 10 mLs by mouth every 6 (six) hours as needed for cough or congestion. 05/05/18   [provider]  donepezil (ARICEPT) 10 MG tablet TAKE 1 TABLET (10 MG TOTAL) BY MOUTH AT BEDTIME. 12/28/15   Burchette, Alinda Sierras, MD  Melatonin 3 MG TABS Take 3 mg by mouth at bedtime.    [provider]  memantine (NAMENDA) 10 MG tablet Take 1 tablet (10 mg total) by mouth 2 (two) times daily. TAKE   1 TABLET (10MG ) BY MOUTH TWICE DAILY Patient taking differently: Take 10 mg by mouth 2 (two) times daily.  02/24/17   Burchette, Alinda Sierras, MD  metoprolol tartrate (LOPRESSOR) 25 MG tablet TAKE ONE-HALF TABLETS (12.5 MG TOTAL) BY MOUTH DAILY. Patient taking differently: Take 12.5 mg by mouth daily.  11/11/16   Burchette, Alinda Sierras, MD  Multiple Vitamins-Minerals (CENTRUM SILVER 50+MEN) TABS Take 1 tablet by mouth daily.    [provider]  ondansetron (ZOFRAN ODT) 8 MG disintegrating tablet Take 1 tablet (8 mg total) by mouth every 8 (eight) hours as needed for nausea or vomiting. Patient not taking: Reported on 09/22/2017 09/21/17   Molpus, Jenny Reichmann, MD  risperiDONE (RISPERDAL) 0.25 MG tablet Take 1 tablet (0.25 mg total) by mouth 2 (two) times daily as needed (anxiety). 07/05/18   Elgergawy, Silver Huguenin, MD     Allergies:     Allergies  Allergen Reactions   Penicillins Other (See Comments)    Has patient had a PCN reaction causing immediate rash, facial/tongue/throat swelling, SOB or lightheadedness with hypotension: Unknown Has patient had a PCN reaction causing severe rash involving mucus membranes or skin necrosis: Unknown Has patient had a PCN reaction that required hospitalization: Unknown Has patient had a PCN reaction occurring within the last 10 years: Unknown If all of the above answers are "NO", then may proceed with Cephalosporin use.      Physical Exam:   Vitals  Blood pressure (!) 157/100, pulse (!) 132, temperature (!) 100.5 F (38.1 C), temperature source Rectal, resp. rate (!) 23, height 6' (1.829 m), weight 77.1 kg, SpO2 100 %.  1.  General: axoxo3  2.  Psychiatric: euthymic  3. Neurologic: cn2-12 intact, reflexes 2+ symmetric, diffuse with no clonus, motor 5/5 in all 4 ext  4. HEENMT:  Anicteric, pupils 1.90mm symmetric, direct, consensual, near intact  5. Respiratory : CTAB  6. Cardiovascular : Midline scar, rrr s1, s2,   7. Gastrointestinal:  Abd: soft, nt, nd, +bs  8. Skin:  Ext: no c/c/e,  Redness over the right wrist and extending up proximally about 1/4 up to elbow, swelling over the dorsum of the wrist   9.Musculoskeletal:  Good ROM,   No adenopathy    Data Review:    CBC Recent Labs  Lab 09/01/18 2128  WBC 15.2*  HGB 13.8  HCT 40.9  PLT 184  MCV 98.6  MCH 33.3  MCHC 33.7  RDW 12.5  LYMPHSABS 1.5  MONOABS 1.7*  EOSABS 0.0  BASOSABS 0.0   ------------------------------------------------------------------------------------------------------------------  Results for orders placed or performed during the hospital encounter of 09/01/18 (from the past 48 hour(s))  Lactic acid, plasma     Status: None   Collection Time: 09/01/18  9:28 PM  Result Value Ref Range   Lactic Acid, Venous 1.7 0.5 - 1.9 mmol/L    Comment: Performed at [facility], [place] [address], [place], [place] [address]  Comprehensive metabolic panel     Status: Abnormal   Collection Time: 09/01/18  9:28 PM  Result Value Ref Range   Sodium 133 (L) 135 - 145 mmol/L   Potassium 4.0 3.5 - 5.1 mmol/L   Chloride 100 98 - 111 mmol/L   CO2 21 (L) 22 - 32 mmol/L   Glucose, Bld 111 (H) 70 - 99 mg/dL   BUN 20 8 - 23 mg/dL   Creatinine, Ser 0.86 0.61 - 1.24 mg/dL   Calcium 8.6 (L) 8.9 - 10.3 mg/dL   Total Protein 6.9 6.5 - 8.1 g/dL   Albumin 3.2 (L) 3.5 - 5.0 g/dL   AST 20 15 - 41 U/L   ALT 13 0 - 44 U/L   Alkaline Phosphatase 64 38 - 126 U/L   Total Bilirubin 1.2 0.3 - 1.2 mg/dL   GFR calc non Af Amer >60 >60 mL/min   GFR calc Af Amer >60 >60 mL/min   Anion gap 12 5 - 15    Comment: Performed at [facility], [place] [address] [place], [place] [address]  CBC WITH DIFFERENTIAL     Status: Abnormal   Collection Time: 09/01/18  9:28 PM  Result Value Ref Range   WBC 15.2 (H) 4.0 - 10.5 K/uL   RBC 4.15 (L) 4.22 - 5.81 MIL/uL   Hemoglobin 13.8 13.0 - 17.0 g/dL   HCT 40.9 39.0 - 52.0 %   MCV 98.6 80.0 - 100.0 fL   MCH 33.3 26.0 - 34.0 pg   MCHC 33.7 30.0 - 36.0 g/dL   RDW 12.5 11.5 - 15.5 %   Platelets 184 150 - 400 K/uL    Comment: REPEATED TO VERIFY SPECIMEN CHECKED FOR CLOTS    nRBC 0.0 0.0 - 0.2 %   Neutrophils Relative % 78 %   Neutro Abs 11.9 (H) 1.7 - 7.7 K/uL   Lymphocytes Relative 10 %   Lymphs Abs 1.5 0.7 - 4.0 K/uL   Monocytes Relative 11 %   Monocytes Absolute 1.7 (H) 0.1 - 1.0 K/uL   Eosinophils Relative 0 %   Eosinophils Absolute 0.0 0.0 - 0.5 K/uL   Basophils Relative 0 %   Basophils Absolute 0.0 0.0 - 0.1 K/uL   Immature Granulocytes 1 %   Abs Immature Granulocytes 0.07 0.00 - 0.07 K/uL    Comment: Performed at [facility], [place] [address], [place], [place] [address]  APTT     Status: Abnormal   Collection Time: 09/01/18  9:28 PM  Result Value Ref Range  aPTT 22 (L) 24 - 36 seconds    Comment: Performed at [facility], [place] [address] [place], [place] [address]  Protime-INR     Status: None   Collection Time: 09/01/18  9:28 PM  Result Value Ref Range   Prothrombin Time 13.6 11.4 - 15.2 seconds   INR 1.1 0.8 - 1.2    Comment: (NOTE) INR goal varies based on device and disease states. Performed at [facility], [place] [address] [place], [place] [address]   Urinalysis, Routine w reflex microscopic     Status: Abnormal   Collection Time: 09/01/18  9:29 PM  Result Value Ref Range   Color, Urine AMBER (A) YELLOW    Comment: BIOCHEMICALS MAY BE AFFECTED BY COLOR   APPearance HAZY (A) CLEAR   Specific Gravity, Urine 1.027 1.005 - 1.030   pH 5.0 5.0 - 8.0   Glucose, UA NEGATIVE NEGATIVE mg/dL   Hgb  urine dipstick MODERATE (A) NEGATIVE   Bilirubin Urine NEGATIVE NEGATIVE   Ketones, ur 20 (A) NEGATIVE mg/dL   Protein, ur 30 (A) NEGATIVE mg/dL   Nitrite NEGATIVE NEGATIVE   Leukocytes,Ua NEGATIVE NEGATIVE   RBC / HPF 11-20 0 - 5 RBC/hpf   WBC, UA 0-5 0 - 5 WBC/hpf   Bacteria, UA RARE (A) NONE SEEN   Squamous Epithelial / LPF 0-5 0 - 5   Mucus PRESENT     Comment: Performed at [facility], [place] [address] [place], [place] [address]  CBG monitoring, ED     Status: None   Collection Time: 09/01/18  9:31 PM  Result Value Ref Range   Glucose-Capillary 94 70 - 99 mg/dL  SARS Coronavirus 2 [facility] order, Performed in [facility] hospital lab) Nasopharyngeal Nasopharyngeal Swab     Status: None   Collection Time: 09/01/18  9:33 PM   Specimen: Nasopharyngeal Swab  Result Value Ref Range   SARS Coronavirus 2 NEGATIVE NEGATIVE    Comment: (NOTE) If result is NEGATIVE SARS-CoV-2 target nucleic acids are NOT DETECTED. The SARS-CoV-2 RNA is generally detectable in upper and lower  respiratory specimens during the acute phase of infection. The lowest  concentration of SARS-CoV-2 viral copies this assay can detect is 250  copies / mL. A negative result does not preclude SARS-CoV-2 infection  and should not be used as the sole basis for treatment or other  patient management decisions.  A negative result may occur with  improper specimen collection / handling, submission of specimen other  than nasopharyngeal swab, presence of viral mutation(s) within the  areas targeted by this assay, and inadequate number of viral copies  (<250 copies / mL). A negative result must be combined with clinical  observations, patient history, and epidemiological information. If result is POSITIVE SARS-CoV-2 target nucleic acids are DETECTED. The SARS-CoV-2 RNA is generally detectable in upper and lower  respiratory specimens dur ing the acute phase of infection.  Positive  results are  indicative of active infection with SARS-CoV-2.  Clinical  correlation with patient history and other diagnostic information is  necessary to determine patient infection status.  Positive results do  not rule out bacterial infection or co-infection with other viruses. If result is PRESUMPTIVE POSTIVE SARS-CoV-2 nucleic acids MAY BE PRESENT.   A presumptive positive result was obtained on the submitted specimen  and confirmed on repeat testing.  While 2019 novel coronavirus  (SARS-CoV-2) nucleic acids may be present in the submitted sample  additional confirmatory testing  may be necessary for epidemiological  and / or clinical management purposes  to differentiate between  SARS-CoV-2 and other Sarbecovirus currently known to infect humans.  If clinically indicated additional testing with an alternate test  methodology [id number]) is advised. The SARS-CoV-2 RNA is generally  detectable in upper and lower respiratory sp ecimens during the acute  phase of infection. The expected result is Negative. Fact Sheet for Patients:  StrictlyIdeas.no Fact Sheet for Healthcare Providers: BankingDealers.co.za This test is not yet approved or cleared by the [place] FDA and has been authorized for detection and/or diagnosis of SARS-CoV-2 by FDA under an Emergency Use Authorization (EUA).  This EUA will remain in effect (meaning this test can be used) for the duration of the COVID-19 declaration under Section 564(b)(1) of the Act, 21 U.S.C. section 360bbb-3(b)(1), unless the authorization is terminated or revoked sooner. Performed at [facility], [place] [address] [place], [place] [address]     Chemistries  Recent Labs  Lab 09/01/18 2128  NA 133*  K 4.0  CL 100  CO2 21*  GLUCOSE 111*  BUN 20  CREATININE 0.86  CALCIUM 8.6*  AST 20  ALT 13  ALKPHOS 64  BILITOT 1.2    ------------------------------------------------------------------------------------------------------------------  ------------------------------------------------------------------------------------------------------------------ GFR: Estimated Creatinine Clearance: 68.5 mL/min (by C-G formula based on SCr of 0.86 mg/dL). Liver Function Tests: Recent Labs  Lab 09/01/18 2128  AST 20  ALT 13  ALKPHOS 64  BILITOT 1.2  PROT 6.9  ALBUMIN 3.2*   No results for input(s): LIPASE, AMYLASE in the last 168 hours. No results for input(s): AMMONIA in the last 168 hours. Coagulation Profile: Recent Labs  Lab 09/01/18 2128  INR 1.1   Cardiac Enzymes: No results for input(s): CKTOTAL, CKMB, CKMBINDEX, TROPONINI in the last 168 hours. BNP (last 3 results) No results for input(s): PROBNP in the last 8760 hours. HbA1C: No results for input(s): HGBA1C in the last 72 hours. CBG: Recent Labs  Lab 09/01/18 2131  GLUCAP 94   Lipid Profile: No results for input(s): CHOL, HDL, LDLCALC, TRIG, CHOLHDL, LDLDIRECT in the last 72 hours. Thyroid Function Tests: No results for input(s): TSH, T4TOTAL, FREET4, T3FREE, THYROIDAB in the last 72 hours. Anemia Panel: No results for input(s): VITAMINB12, FOLATE, FERRITIN, TIBC, IRON, RETICCTPCT in the last 72 hours.  --------------------------------------------------------------------------------------------------------------- Urine analysis:    Component Value Date/Time   COLORURINE AMBER (A) 09/01/2018 2129   APPEARANCEUR HAZY (A) 09/01/2018 2129   LABSPEC 1.027 09/01/2018 2129   PHURINE 5.0 09/01/2018 2129   GLUCOSEU NEGATIVE 09/01/2018 2129   HGBUR MODERATE (A) 09/01/2018 2129   BILIRUBINUR NEGATIVE 09/01/2018 2129   BILIRUBINUR neg 02/08/2017 1149   KETONESUR 20 (A) 09/01/2018 2129   PROTEINUR 30 (A) 09/01/2018 2129   UROBILINOGEN 0.2 02/08/2017 1149   UROBILINOGEN 0.2 12/15/2008 1405   NITRITE NEGATIVE 09/01/2018 2129   LEUKOCYTESUR  NEGATIVE 09/01/2018 2129      Imaging Results:    Dg Wrist Complete Right  Result Date: 09/01/2018 CLINICAL DATA:  Initial evaluation for acute warmth and swelling about the right wrist. History of recent fall. EXAM: RIGHT WRIST - COMPLETE 3+ VIEW COMPARISON:  None. FINDINGS: No acute fracture or dislocation. Normal radiocarpal and distal radioulnar articulations maintained. Mild degenerative changes with chondrocalcinosis noted about the wrist. Prominent osteoarthritic changes noted at the first [facility] joint. Mild diffuse soft tissue swelling about the visualized right hand and wrist. IMPRESSION: 1. No acute osseous abnormality. 2. Mild diffuse soft tissue swelling about the visualized  right hand and wrist. 3. Moderate degenerative osteoarthritic changes, most notable at the first [facility] joint. Electronically Signed   By: Jeannine Boga M.D.   On: 09/01/2018 22:57   Ct Head Wo Contrast  Result Date: 09/01/2018 CLINICAL DATA:  Fevers.  Dementia.  Head trauma. EXAM: CT HEAD WITHOUT CONTRAST CT CERVICAL SPINE WITHOUT CONTRAST TECHNIQUE: Multidetector CT imaging of the head and cervical spine was performed following the standard protocol without intravenous contrast. Multiplanar CT image reconstructions of the cervical spine were also generated. COMPARISON:  06/18/2018 FINDINGS: CT HEAD FINDINGS Brain: No evidence of acute infarction, hemorrhage, hydrocephalus, extra-axial collection or mass lesion/mass effect. There is mild diffuse low-attenuation within the subcortical and periventricular white matter compatible with chronic microvascular disease. Prominence of sulci and ventricles compatible with brain atrophy. Vascular: No hyperdense vessel or unexpected calcification. Skull: Normal. Negative for fracture or focal lesion. Sinuses/Orbits: No acute finding. Other: None CT CERVICAL SPINE FINDINGS Alignment: Normal. Skull base and vertebrae: No acute fracture. No primary bone lesion or focal pathologic  process. Soft tissues and spinal canal: No prevertebral fluid or swelling. No visible canal hematoma. Disc levels: Disc space narrowing in endplate spurring identified at C5-6, C6-7 and C7-T1. Upper chest: Negative. Other: None IMPRESSION: 1. No acute intracranial abnormality. Advanced chronic small vessel ischemic disease and brain atrophy. 2. No evidence for cervical spine fracture. Multi level cervical spondylosis noted. Electronically Signed   By: Kerby Moors M.D.   On: 09/01/2018 23:19   Ct Cervical Spine Wo Contrast  Result Date: 09/01/2018 CLINICAL DATA:  Fevers.  Dementia.  Head trauma. EXAM: CT HEAD WITHOUT CONTRAST CT CERVICAL SPINE WITHOUT CONTRAST TECHNIQUE: Multidetector CT imaging of the head and cervical spine was performed following the standard protocol without intravenous contrast. Multiplanar CT image reconstructions of the cervical spine were also generated. COMPARISON:  06/18/2018 FINDINGS: CT HEAD FINDINGS Brain: No evidence of acute infarction, hemorrhage, hydrocephalus, extra-axial collection or mass lesion/mass effect. There is mild diffuse low-attenuation within the subcortical and periventricular white matter compatible with chronic microvascular disease. Prominence of sulci and ventricles compatible with brain atrophy. Vascular: No hyperdense vessel or unexpected calcification. Skull: Normal. Negative for fracture or focal lesion. Sinuses/Orbits: No acute finding. Other: None CT CERVICAL SPINE FINDINGS Alignment: Normal. Skull base and vertebrae: No acute fracture. No primary bone lesion or focal pathologic process. Soft tissues and spinal canal: No prevertebral fluid or swelling. No visible canal hematoma. Disc levels: Disc space narrowing in endplate spurring identified at C5-6, C6-7 and C7-T1. Upper chest: Negative. Other: None IMPRESSION: 1. No acute intracranial abnormality. Advanced chronic small vessel ischemic disease and brain atrophy. 2. No evidence for cervical spine  fracture. Multi level cervical spondylosis noted. Electronically Signed   By: Kerby Moors M.D.   On: 09/01/2018 23:19   Dg Chest Port 1 View  Result Date: 09/01/2018 CLINICAL DATA:  Shortness of breath EXAM: PORTABLE CHEST 1 VIEW COMPARISON:  06/18/2018 FINDINGS: Prior median sternotomy and valve replacement. Mild cardiomegaly. Aortic atherosclerosis. No confluent opacity, effusion or edema. No acute bony abnormality. IMPRESSION: No active disease. Electronically Signed   By: Rolm Baptise M.D.   On: 09/01/2018 22:54   Dg Hips Bilat W Or Wo Pelvis 3-4 Views  Result Date: 09/01/2018 CLINICAL DATA:  Initial evaluation for recent trauma, fall. EXAM: DG HIP (WITH OR WITHOUT PELVIS) 3-4V BILAT COMPARISON:  None available. FINDINGS: No acute fracture dislocation. Femoral heads in normal alignment within the acetabula. Femoral head heights maintained. Bony pelvis intact. Mild osteoarthritic  changes present about the hips bilaterally. Degenerative changes noted within the lower lumbar spine. No definite acute soft tissue abnormality. Large volume retained stool seen impacted within the rectal vault. IMPRESSION: 1. No acute osseous abnormality. 2. Mild osteoarthritic changes about the hips bilaterally. 3. Large volume retained stool impacted within the rectal vault, suggesting constipation. Electronically Signed   By: Jeannine Boga M.D.   On: 09/01/2018 23:00   ekg ordered but can't see in epic   Assessment & Plan:    Principal Problem:   Sepsis ([place]) Active Problems:   Hx of CABG   Dementia (HCC)   Cellulitis  Fever, Sepsis (fever, tachycardia, tachypnea, elevated wbc) likely secondary to  Cellulitis R wrist Blood culture x2 Vanco, cefepime iv  Tachycardia Tele Trop I q2h x2  CAD s/p CABG, AVR Cont Aspirin 81mg  po qday Cont Lipitor 40mg  po qhs Cont Toprol XL 25mg  po qday  Dementia Cont Aricept 10mg  po qday Cont Namenda 10mg  po bid Cont Risperdal 0.25mg  po qhs     DVT  Prophylaxis-   Lovenox - SCDs   AM Labs Ordered, also please review Full Orders  Family Communication: Admission, patients condition and plan of care including tests being ordered have been discussed with the patient  who indicate understanding and agree with the plan and Code Status.  Code Status:  DNR,  Notified his guardian that he was being admitted to [facility]  Admission status: Inpatient: Based on patients clinical presentation and evaluation of above clinical data, I have made determination that patient meets Inpatient criteria at this time.    Time spent in minutes : 55   Jani Gravel M.D on 09/02/2018 at 1:08 AM

## 2018-09-02 NOTE — ED Notes (Addendum)
ED TO INPATIENT HANDOFF REPORT  ED Nurse Name and Phone #: Fredonia Highland [phone]  S Name/Age/Gender Marvin Wagner 85 y.o. male Room/Bed: WA18/WA18  Code Status   Code Status: DNR  Home/SNF/Other Home Patient oriented to: self, place, time and situation Is this baseline? Yes   Triage Complete: Triage complete  Chief Complaint covid+; shob  Triage Note Patient brought in by [facility]. Patient has been running fever for the past two days. Hx of dementia. Heavy breathing and rr-30. Room air at 96%. Right wrist warm to touch and swollen. Patient had a fall last week.    Allergies Allergies  Allergen Reactions  . Penicillins Other (See Comments)    Has patient had a PCN reaction causing immediate rash, facial/tongue/throat swelling, SOB or lightheadedness with hypotension: Unknown Has patient had a PCN reaction causing severe rash involving mucus membranes or skin necrosis: Unknown Has patient had a PCN reaction that required hospitalization: Unknown Has patient had a PCN reaction occurring within the last 10 years: Unknown If all of the above answers are "NO", then may proceed with Cephalosporin use.     Level of Care/Admitting Diagnosis ED Disposition    ED Disposition Condition Comment   Admit  Hospital Area: [place] [[id number]]  Level of Care: Telemetry [5]  Admit to tele based on following criteria: Monitor for Ischemic changes  Covid Evaluation: Confirmed COVID Negative  Diagnosis: Sepsis [facility]) [[id number]]  Admitting Physician: Jani Gravel [[id number]]  Attending Physician: Jani Gravel [phone]  Estimated length of stay: past midnight tomorrow  Certification:: I certify this patient will need inpatient services for at least 2 midnights  PT Class (Do Not Modify): Inpatient [101]  PT Acc Code (Do Not Modify): Private [1]       B Medical/Surgery History Past Medical History:  Diagnosis Date  . Aortic insufficiency and aortic stenosis   . Aortic stenosis     SEVERE  . CAD (coronary artery disease)   . Heart murmur   . Hyperlipidemia   . Hypertension   . Mild memory disturbance    Past Surgical History:  Procedure Laterality Date  . AORTIC VALVE REPLACEMENT  12/17/08  . CARDIAC CATHETERIZATION    . CORONARY ANGIOPLASTY     NORMAL LEFT VENTRICULAR SIZEAND CONTRACTILITY WITH NORMAL SYSTOLIC FUNCTION. EF 65%  . TONSILLECTOMY       A IV Location/Drains/Wounds Patient Lines/Drains/Airways Status   Active Line/Drains/Airways    Name:   Placement date:   Placement time:   Site:   Days:   Peripheral IV 09/01/18 Anterior;Distal;Right;Upper Arm   09/01/18    -    Arm   1   Peripheral IV 09/01/18 Right Forearm   09/01/18    2145    Forearm   1   External Urinary Catheter   07/04/18    0740    -   60          Intake/Output Last 24 hours  Intake/Output Summary (Last 24 hours) at 09/02/2018 0134 Last data filed at 09/02/2018 0107 Gross per 24 hour  Intake 1900 ml  Output -  Net 1900 ml    Labs/Imaging Results for orders placed or performed during the hospital encounter of 09/01/18 (from the past 48 hour(s))  Lactic acid, plasma     Status: None   Collection Time: 09/01/18  9:28 PM  Result Value Ref Range   Lactic Acid, Venous 1.7 0.5 - 1.9 mmol/L    Comment: Performed at [facility], [place] [address] [place], [place] [address]  Comprehensive metabolic panel     Status: Abnormal   Collection Time: 09/01/18  9:28 PM  Result Value Ref Range   Sodium 133 (L) 135 - 145 mmol/L   Potassium 4.0 3.5 - 5.1 mmol/L   Chloride 100 98 - 111 mmol/L   CO2 21 (L) 22 - 32 mmol/L   Glucose, Bld 111 (H) 70 - 99 mg/dL   BUN 20 8 - 23 mg/dL   Creatinine, Ser 0.86 0.61 - 1.24 mg/dL   Calcium 8.6 (L) 8.9 - 10.3 mg/dL   Total Protein 6.9 6.5 - 8.1 g/dL   Albumin 3.2 (L) 3.5 - 5.0 g/dL   AST 20 15 - 41 U/L   ALT 13 0 - 44 U/L   Alkaline Phosphatase 64 38 - 126 U/L   Total Bilirubin 1.2 0.3 - 1.2 mg/dL   GFR calc non Af Amer >60  >60 mL/min   GFR calc Af Amer >60 >60 mL/min   Anion gap 12 5 - 15    Comment: Performed at [facility], [place] [address], [place], [place] [address]  CBC WITH DIFFERENTIAL     Status: Abnormal   Collection Time: 09/01/18  9:28 PM  Result Value Ref Range   WBC 15.2 (H) 4.0 - 10.5 K/uL   RBC 4.15 (L) 4.22 - 5.81 MIL/uL   Hemoglobin 13.8 13.0 - 17.0 g/dL   HCT 40.9 39.0 - 52.0 %   MCV 98.6 80.0 - 100.0 fL   MCH 33.3 26.0 - 34.0 pg   MCHC 33.7 30.0 - 36.0 g/dL   RDW 12.5 11.5 - 15.5 %   Platelets 184 150 - 400 K/uL    Comment: REPEATED TO VERIFY SPECIMEN CHECKED FOR CLOTS    nRBC 0.0 0.0 - 0.2 %   Neutrophils Relative % 78 %   Neutro Abs 11.9 (H) 1.7 - 7.7 K/uL   Lymphocytes Relative 10 %   Lymphs Abs 1.5 0.7 - 4.0 K/uL   Monocytes Relative 11 %   Monocytes Absolute 1.7 (H) 0.1 - 1.0 K/uL   Eosinophils Relative 0 %   Eosinophils Absolute 0.0 0.0 - 0.5 K/uL   Basophils Relative 0 %   Basophils Absolute 0.0 0.0 - 0.1 K/uL   Immature Granulocytes 1 %   Abs Immature Granulocytes 0.07 0.00 - 0.07 K/uL    Comment: Performed at [facility], [place] [address], [place], [place] [address]  APTT     Status: Abnormal   Collection Time: 09/01/18  9:28 PM  Result Value Ref Range   aPTT 22 (L) 24 - 36 seconds    Comment: Performed at [facility], [place] [address], [place], [place] [address]  Protime-INR     Status: None   Collection Time: 09/01/18  9:28 PM  Result Value Ref Range   Prothrombin Time 13.6 11.4 - 15.2 seconds   INR 1.1 0.8 - 1.2    Comment: (NOTE) INR goal varies based on device and disease states. Performed at [facility], [place] [address] [place], [place] [address]   Urinalysis, Routine w reflex microscopic     Status: Abnormal   Collection Time: 09/01/18  9:29 PM  Result Value Ref Range   Color, Urine AMBER (A) YELLOW    Comment: BIOCHEMICALS MAY BE AFFECTED BY COLOR   APPearance HAZY (A) CLEAR    Specific Gravity, Urine 1.027 1.005 - 1.030   pH  5.0 5.0 - 8.0   Glucose, UA NEGATIVE NEGATIVE mg/dL   Hgb urine dipstick MODERATE (A) NEGATIVE   Bilirubin Urine NEGATIVE NEGATIVE   Ketones, ur 20 (A) NEGATIVE mg/dL   Protein, ur 30 (A) NEGATIVE mg/dL   Nitrite NEGATIVE NEGATIVE   Leukocytes,Ua NEGATIVE NEGATIVE   RBC / HPF 11-20 0 - 5 RBC/hpf   WBC, UA 0-5 0 - 5 WBC/hpf   Bacteria, UA RARE (A) NONE SEEN   Squamous Epithelial / LPF 0-5 0 - 5   Mucus PRESENT     Comment: Performed at [facility], [place] [address] [place], [place] [address]  CBG monitoring, ED     Status: None   Collection Time: 09/01/18  9:31 PM  Result Value Ref Range   Glucose-Capillary 94 70 - 99 mg/dL  SARS Coronavirus 2 [facility] order, Performed in [facility] hospital lab) Nasopharyngeal Nasopharyngeal Swab     Status: None   Collection Time: 09/01/18  9:33 PM   Specimen: Nasopharyngeal Swab  Result Value Ref Range   SARS Coronavirus 2 NEGATIVE NEGATIVE    Comment: (NOTE) If result is NEGATIVE SARS-CoV-2 target nucleic acids are NOT DETECTED. The SARS-CoV-2 RNA is generally detectable in upper and lower  respiratory specimens during the acute phase of infection. The lowest  concentration of SARS-CoV-2 viral copies this assay can detect is 250  copies / mL. A negative result does not preclude SARS-CoV-2 infection  and should not be used as the sole basis for treatment or other  patient management decisions.  A negative result may occur with  improper specimen collection / handling, submission of specimen other  than nasopharyngeal swab, presence of viral mutation(s) within the  areas targeted by this assay, and inadequate number of viral copies  (<250 copies / mL). A negative result must be combined with clinical  observations, patient history, and epidemiological information. If result is POSITIVE SARS-CoV-2 target nucleic acids are DETECTED. The SARS-CoV-2 RNA is generally  detectable in upper and lower  respiratory specimens dur ing the acute phase of infection.  Positive  results are indicative of active infection with SARS-CoV-2.  Clinical  correlation with patient history and other diagnostic information is  necessary to determine patient infection status.  Positive results do  not rule out bacterial infection or co-infection with other viruses. If result is PRESUMPTIVE POSTIVE SARS-CoV-2 nucleic acids MAY BE PRESENT.   A presumptive positive result was obtained on the submitted specimen  and confirmed on repeat testing.  While 2019 novel coronavirus  (SARS-CoV-2) nucleic acids may be present in the submitted sample  additional confirmatory testing may be necessary for epidemiological  and / or clinical management purposes  to differentiate between  SARS-CoV-2 and other Sarbecovirus currently known to infect humans.  If clinically indicated additional testing with an alternate test  methodology [phone]) is advised. The SARS-CoV-2 RNA is generally  detectable in upper and lower respiratory sp ecimens during the acute  phase of infection. The expected result is Negative. Fact Sheet for Patients:  StrictlyIdeas.no Fact Sheet for Healthcare Providers: BankingDealers.co.za This test is not yet approved or cleared by the [place] FDA and has been authorized for detection and/or diagnosis of SARS-CoV-2 by FDA under an Emergency Use Authorization (EUA).  This EUA will remain in effect (meaning this test can be used) for the duration of the COVID-19 declaration under Section 564(b)(1) of the Act, 21 U.S.C. section 360bbb-3(b)(1), unless the authorization is terminated or revoked sooner. Performed at  [facility], [place] [address], [place], [place] [address]    Dg Wrist Complete Right  Result Date: 09/01/2018 CLINICAL DATA:  Initial evaluation for acute warmth and swelling about the right  wrist. History of recent fall. EXAM: RIGHT WRIST - COMPLETE 3+ VIEW COMPARISON:  None. FINDINGS: No acute fracture or dislocation. Normal radiocarpal and distal radioulnar articulations maintained. Mild degenerative changes with chondrocalcinosis noted about the wrist. Prominent osteoarthritic changes noted at the first [facility] joint. Mild diffuse soft tissue swelling about the visualized right hand and wrist. IMPRESSION: 1. No acute osseous abnormality. 2. Mild diffuse soft tissue swelling about the visualized right hand and wrist. 3. Moderate degenerative osteoarthritic changes, most notable at the first [facility] joint. Electronically Signed   By: Jeannine Boga M.D.   On: 09/01/2018 22:57   Ct Head Wo Contrast  Result Date: 09/01/2018 CLINICAL DATA:  Fevers.  Dementia.  Head trauma. EXAM: CT HEAD WITHOUT CONTRAST CT CERVICAL SPINE WITHOUT CONTRAST TECHNIQUE: Multidetector CT imaging of the head and cervical spine was performed following the standard protocol without intravenous contrast. Multiplanar CT image reconstructions of the cervical spine were also generated. COMPARISON:  06/18/2018 FINDINGS: CT HEAD FINDINGS Brain: No evidence of acute infarction, hemorrhage, hydrocephalus, extra-axial collection or mass lesion/mass effect. There is mild diffuse low-attenuation within the subcortical and periventricular white matter compatible with chronic microvascular disease. Prominence of sulci and ventricles compatible with brain atrophy. Vascular: No hyperdense vessel or unexpected calcification. Skull: Normal. Negative for fracture or focal lesion. Sinuses/Orbits: No acute finding. Other: None CT CERVICAL SPINE FINDINGS Alignment: Normal. Skull base and vertebrae: No acute fracture. No primary bone lesion or focal pathologic process. Soft tissues and spinal canal: No prevertebral fluid or swelling. No visible canal hematoma. Disc levels: Disc space narrowing in endplate spurring identified at C5-6, C6-7 and C7-T1.  Upper chest: Negative. Other: None IMPRESSION: 1. No acute intracranial abnormality. Advanced chronic small vessel ischemic disease and brain atrophy. 2. No evidence for cervical spine fracture. Multi level cervical spondylosis noted. Electronically Signed   By: Kerby Moors M.D.   On: 09/01/2018 23:19   Ct Cervical Spine Wo Contrast  Result Date: 09/01/2018 CLINICAL DATA:  Fevers.  Dementia.  Head trauma. EXAM: CT HEAD WITHOUT CONTRAST CT CERVICAL SPINE WITHOUT CONTRAST TECHNIQUE: Multidetector CT imaging of the head and cervical spine was performed following the standard protocol without intravenous contrast. Multiplanar CT image reconstructions of the cervical spine were also generated. COMPARISON:  06/18/2018 FINDINGS: CT HEAD FINDINGS Brain: No evidence of acute infarction, hemorrhage, hydrocephalus, extra-axial collection or mass lesion/mass effect. There is mild diffuse low-attenuation within the subcortical and periventricular white matter compatible with chronic microvascular disease. Prominence of sulci and ventricles compatible with brain atrophy. Vascular: No hyperdense vessel or unexpected calcification. Skull: Normal. Negative for fracture or focal lesion. Sinuses/Orbits: No acute finding. Other: None CT CERVICAL SPINE FINDINGS Alignment: Normal. Skull base and vertebrae: No acute fracture. No primary bone lesion or focal pathologic process. Soft tissues and spinal canal: No prevertebral fluid or swelling. No visible canal hematoma. Disc levels: Disc space narrowing in endplate spurring identified at C5-6, C6-7 and C7-T1. Upper chest: Negative. Other: None IMPRESSION: 1. No acute intracranial abnormality. Advanced chronic small vessel ischemic disease and brain atrophy. 2. No evidence for cervical spine fracture. Multi level cervical spondylosis noted. Electronically Signed   By: Kerby Moors M.D.   On: 09/01/2018 23:19   Dg Chest Port 1 View  Result Date: 09/01/2018 CLINICAL DATA:  Shortness  of breath EXAM: PORTABLE CHEST 1 VIEW COMPARISON:  06/18/2018 FINDINGS: Prior median sternotomy and valve replacement. Mild cardiomegaly. Aortic atherosclerosis. No confluent opacity, effusion or edema. No acute bony abnormality. IMPRESSION: No active disease. Electronically Signed   By: Rolm Baptise M.D.   On: 09/01/2018 22:54   Dg Hips Bilat W Or Wo Pelvis 3-4 Views  Result Date: 09/01/2018 CLINICAL DATA:  Initial evaluation for recent trauma, fall. EXAM: DG HIP (WITH OR WITHOUT PELVIS) 3-4V BILAT COMPARISON:  None available. FINDINGS: No acute fracture dislocation. Femoral heads in normal alignment within the acetabula. Femoral head heights maintained. Bony pelvis intact. Mild osteoarthritic changes present about the hips bilaterally. Degenerative changes noted within the lower lumbar spine. No definite acute soft tissue abnormality. Large volume retained stool seen impacted within the rectal vault. IMPRESSION: 1. No acute osseous abnormality. 2. Mild osteoarthritic changes about the hips bilaterally. 3. Large volume retained stool impacted within the rectal vault, suggesting constipation. Electronically Signed   By: Jeannine Boga M.D.   On: 09/01/2018 23:00    Pending Labs Unresulted Labs (From admission, onward)    Start     Ordered   09/09/18 0500  Creatinine, serum  (enoxaparin (LOVENOX)    CrCl >/= 30 ml/min)  Weekly,   R    Comments: while on enoxaparin therapy    09/02/18 0031   09/02/18 0500  Comprehensive metabolic panel  Tomorrow morning,   R     09/02/18 0031   09/02/18 0500  CBC  Tomorrow morning,   R     09/02/18 0031   09/01/18 2116  Blood Culture (routine x 2)  BLOOD CULTURE X 2,   STAT     09/01/18 2117   09/01/18 2116  Urine culture  ONCE - STAT,   STAT     09/01/18 2117          Vitals/Pain Today's Vitals   09/01/18 2315 09/02/18 0000 09/02/18 0045 09/02/18 0130  BP: 125/89 139/71 (!) 157/100 (!) 135/105  Pulse: 88 61 (!) 132 (!) 41  Resp: 19 (!) 24 (!) 23  (!) 21  Temp:      TempSrc:      SpO2: 98% 99% 100% 95%  Weight:      Height:        Isolation Precautions Airborne and Contact precautions  Medications Medications  enoxaparin (LOVENOX) injection 40 mg (has no administration in time range)  0.9 %  sodium chloride infusion (has no administration in time range)  acetaminophen (TYLENOL) tablet 650 mg ( Oral See Alternative 09/02/18 0134)    Or  acetaminophen (TYLENOL) suppository 650 mg (650 mg Rectal Given 09/02/18 0134)  ceFEPIme (MAXIPIME) 2 g in sodium chloride 0.9 % 100 mL IVPB (0 g Intravenous Stopped 09/01/18 2306)  metroNIDAZOLE (FLAGYL) IVPB 500 mg (0 mg Intravenous Stopped 09/02/18 0047)  vancomycin (VANCOCIN) 1,500 mg in sodium chloride 0.9 % 500 mL IVPB (0 mg Intravenous Stopped 09/02/18 0107)  sodium chloride 0.9 % bolus 500 mL (0 mLs Intravenous Stopped 09/01/18 2314)    Mobility non-ambulatory High fall risk   Focused Assessments Sepsis   R Recommendations: See Admitting Provider Note  Report given to: Mechele Claude RN  Additional Notes:

## 2018-09-02 NOTE — Progress Notes (Signed)
Pharmacy Antibiotic Note  Marvin Wagner is a 85 y.o. male admitted on 09/01/2018 with sepsis.  Pharmacy has been consulted for Vancomycin, cefepime dosing.  Plan:  Vancomycin 1.5gm iv x1, then Vancomycin 1750 mg IV Q 24 hrs. Goal AUC 400-550. Expected AUC: 511 SCr used: 0.86  Cefepime 2gm iv x1, then 2gm iv q8hr   Height: 6' (182.9 cm) Weight: 170 lb (77.1 kg) IBW/kg (Calculated) : 77.6  Temp (24hrs), Avg:99.5 F (37.5 C), Min:98.4 F (36.9 C), Max:100.5 F (38.1 C)  Recent Labs  Lab 09/01/18 2128  WBC 15.2*  CREATININE 0.86  LATICACIDVEN 1.7    Estimated Creatinine Clearance: 68.5 mL/min (by C-G formula based on SCr of 0.86 mg/dL).    Allergies  Allergen Reactions  . Penicillins Other (See Comments)    Has patient had a PCN reaction causing immediate rash, facial/tongue/throat swelling, SOB or lightheadedness with hypotension: Unknown Has patient had a PCN reaction causing severe rash involving mucus membranes or skin necrosis: Unknown Has patient had a PCN reaction that required hospitalization: Unknown Has patient had a PCN reaction occurring within the last 10 years: Unknown If all of the above answers are "NO", then may proceed with Cephalosporin use.     Antimicrobials this admission: Vancomycin 09/01/2018 >> Cefepime 09/01/2018 >>   Dose adjustments this admission: -  Microbiology results: -  Thank you for allowing pharmacy to be a part of this patient's care.  Nani Skillern Crowford 09/02/2018 3:48 AM

## 2018-09-02 NOTE — ED Notes (Signed)
Cleaned up patient. Patient had a bowel movement that was brown formed stool. Patient sacrum is red and nonblanch on the right cheek.

## 2018-09-02 NOTE — Progress Notes (Addendum)
PROGRESS NOTE    Marvin Wagner  KKX:[id number] DOB: October 21, 1930 DOA: 09/01/2018 PCP: Eulas Post, MD  Brief Narrative:  Marvin Wagner  is Marvin Wagner 85 y.o. male,  w CAD s/p CABG, h/o Aortic stenosis s/p AVR,  Dementia, apparently presents with fever x2 days,  Pt is unable to give any meaningful history.  Pt apparently fell sometime last week.  Right wrist slightly swollen.   In ED,  T 100.5, P 129 R 23, Bp 144/87  Pox 91% on RA Wt 77.1, kg  R wrist IMPRESSION: 1. No acute osseous abnormality. 2. Mild diffuse soft tissue swelling about the visualized right hand and wrist. 3. Moderate degenerative osteoarthritic changes, most notable at the first [facility] joint.  CXR IMPRESSION: No active disease.  Xray Hip/ pelvis IMPRESSION: 1. No acute osseous abnormality. 2. Mild osteoarthritic changes about the hips bilaterally. 3. Large volume retained stool impacted within the rectal vault, suggesting constipation.  CT brain IMPRESSION: 1. No acute intracranial abnormality. Advanced chronic small vessel ischemic disease and brain atrophy. 2. No evidence for cervical spine fracture. Multi level cervical spondylosis noted.  covid -19 negative  Urinalysis rbc 11-20, wbc 0-5  Na 133, K 4.0, Bun 20, Creatinine 0.86 Ast 20, Alt 13 Wbc 15.2, Hgb 13.8, Plt 184  INR 1.1  Pt will be admitted for fever secondary to sepsis secondary to cellulitis of right wrist  Assessment & Plan:   Principal Problem:   Sepsis ([place]) Active Problems:   Hx of CABG   Dementia (HCC)   Cellulitis  Fever   Sepsis   Community Acquired Pneumonia vs Cellulitis of R Wrist Concern for R wrist cellulitis on admission, but CT chest with ground glass opacities concerning for infection.   Triage note mentioned RR 30 and increased WOB as well as R wrist warm to touch and swollen Initially concern for cellulitis of R wrist, I was unable to examine this well today as pt had on mittens and would not let me  remove this Imaging shows edema, though he did have fall within past week -> traumatic vs cellulitis? He was dx with COVID 65 in May and has since tested negative COVID 19 testing negative at this admission CT chest with patchy ground glass opacities in both upper lobes and L perihilar region, possibly infectious/inflammatory Will continue vanc/cefepime -> narrow as able Follow MRSA PCR Sputum, urine legionella, urine strep RVP Blood and urine culture SLP eval Procalcitonin Repeat COVID 19 testing and continue airborne precautions - lower suspicion for this as he had infection in May, but pt is coming from SNF and with ground glass opacities (presented with elevated WBC count as well, normal lymphocytes).  Keep precautions in place until daytime MD removes - consider pulm or ID c/s with imaging findings and hx?.  Tachycardia improved  CAD s/p CABG, AVR Cont Aspirin 81mg  po qday Not taking lipitor per med rec Cont Toprol XL 25mg  po qday  Dementia Per med rec, not taking aricept or namenda Cont Risperdal 0.25mg  po BID  DVT prophylaxis: lovenox Code Status: DNR Family Communication: none at bedside Disposition Plan: pending further w/u and improvement   Consultants:   none  Procedures:   none  Antimicrobials:  Anti-infectives (From admission, onward)   Start     Dose/Rate Route Frequency Ordered Stop   09/02/18 1800  vancomycin (VANCOCIN) 1,750 mg in sodium chloride 0.9 % 500 mL IVPB     1,750 mg 250 mL/hr over 120 Minutes Intravenous Every 24 hours  09/02/18 0347     09/02/18 0600  ceFEPIme (MAXIPIME) 2 g in sodium chloride 0.9 % 100 mL IVPB     2 g 200 mL/hr over 30 Minutes Intravenous Every 8 hours 09/02/18 0347     09/01/18 2130  ceFEPIme (MAXIPIME) 2 g in sodium chloride 0.9 % 100 mL IVPB     2 g 200 mL/hr over 30 Minutes Intravenous  Once 09/01/18 2117 09/01/18 2306   09/01/18 2130  metroNIDAZOLE (FLAGYL) IVPB 500 mg     500 mg 100 mL/hr over 60 Minutes  Intravenous  Once 09/01/18 2117 09/02/18 0047   09/01/18 2130  vancomycin (VANCOCIN) IVPB 1000 mg/200 mL premix  Status:  Discontinued     1,000 mg 200 mL/hr over 60 Minutes Intravenous  Once 09/01/18 2117 09/01/18 2119   09/01/18 2130  vancomycin (VANCOCIN) 1,500 mg in sodium chloride 0.9 % 500 mL IVPB     1,500 mg 250 mL/hr over 120 Minutes Intravenous  Once 09/01/18 2119 09/02/18 0107         Subjective: Unable to communicate with dementia  Objective: Vitals:   09/02/18 0130 09/02/18 0218 09/02/18 0600 09/02/18 1055  BP: (!) 135/105 130/64 108/74   Pulse: (!) 41 95 90   Resp: (!) 21 18 20    Temp:  98.4 F (36.9 C) 97.7 F (36.5 C)   TempSrc:  Oral Axillary   SpO2: 95% 100% 97% 99%  Weight:   78.4 kg   Height:   6' (1.829 m)     Intake/Output Summary (Last 24 hours) at 09/02/2018 1448 Last data filed at 09/02/2018 0945 Gross per 24 hour  Intake 2310.42 ml  Output 725 ml  Net 1585.42 ml   Filed Weights   09/01/18 2113 09/02/18 0600  Weight: 77.1 kg 78.4 kg    Examination:  General exam: Appears calm and comfortable  Respiratory system: unlabored, no O2 in place Cardiovascular system: RRR. N Gastrointestinal system: Abdomen is nondistended, soft and nontender. Central nervous system: Alert and disoriented. No focal neurological deficits. Extremities: difficult to examine R wrist, mild erythema and swelling, but he had mittens on and would not let me remove this  Data Reviewed: I have personally reviewed following labs and imaging studies  CBC: Recent Labs  Lab 09/01/18 2128 09/02/18 0455  WBC 15.2* 12.4*  NEUTROABS 11.9*  --   HGB 13.8 11.1*  HCT 40.9 33.9*  MCV 98.6 99.1  PLT 184 716   Basic Metabolic Panel: Recent Labs  Lab 09/01/18 2128 09/02/18 0455  NA 133* 137  K 4.0 3.6  CL 100 105  CO2 21* 22  GLUCOSE 111* 103*  BUN 20 19  CREATININE 0.86 0.79  CALCIUM 8.6* 7.7*   GFR: Estimated Creatinine Clearance: 74.1 mL/min (by C-G formula based  on SCr of 0.79 mg/dL). Liver Function Tests: Recent Labs  Lab 09/01/18 2128 09/02/18 0455  AST 20 13*  ALT 13 10  ALKPHOS 64 50  BILITOT 1.2 0.9  PROT 6.9 5.5*  ALBUMIN 3.2* 2.6*   No results for input(s): LIPASE, AMYLASE in the last 168 hours. No results for input(s): AMMONIA in the last 168 hours. Coagulation Profile: Recent Labs  Lab 09/01/18 2128  INR 1.1   Cardiac Enzymes: No results for input(s): CKTOTAL, CKMB, CKMBINDEX, TROPONINI in the last 168 hours. BNP (last 3 results) No results for input(s): PROBNP in the last 8760 hours. HbA1C: No results for input(s): HGBA1C in the last 72 hours. CBG: Recent Labs  Lab 09/01/18  2131  GLUCAP 94   Lipid Profile: No results for input(s): CHOL, HDL, LDLCALC, TRIG, CHOLHDL, LDLDIRECT in the last 72 hours. Thyroid Function Tests: No results for input(s): TSH, T4TOTAL, FREET4, T3FREE, THYROIDAB in the last 72 hours. Anemia Panel: No results for input(s): VITAMINB12, FOLATE, FERRITIN, TIBC, IRON, RETICCTPCT in the last 72 hours. Sepsis Labs: Recent Labs  Lab 09/01/18 2128  LATICACIDVEN 1.7    Recent Results (from the past 240 hour(s))  Blood Culture (routine x 2)     Status: None (Preliminary result)   Collection Time: 09/01/18  9:29 PM   Specimen: BLOOD  Result Value Ref Range Status   Specimen Description   Final    BLOOD LEFT ANTECUBITAL Performed at [facility] [address] [place], [place] [address]    Special Requests   Final    BOTTLES DRAWN AEROBIC AND ANAEROBIC Blood Culture results may not be optimal due to an excessive volume of blood received in culture bottles Performed at [facility] [address] [place], [place] [address]    Culture   Final    NO GROWTH < 12 HOURS Performed at [facility] [address] [place], [place] [address]    Report Status PENDING  Incomplete  Blood Culture (routine x 2)     Status: None (Preliminary result)    Collection Time: 09/01/18  9:33 PM   Specimen: BLOOD RIGHT FOREARM  Result Value Ref Range Status   Specimen Description   Final    BLOOD RIGHT FOREARM Performed at [facility], [place] [address], [place], [place] [address]    Special Requests   Final    BOTTLES DRAWN AEROBIC AND ANAEROBIC Blood Culture results may not be optimal due to an excessive volume of blood received in culture bottles Performed at [facility] [address] [place], [place] [address]    Culture   Final    NO GROWTH < 12 HOURS Performed at [facility] [address], [place],  [address]    Report Status PENDING  Incomplete  SARS Coronavirus 2 [facility] order, Performed in [facility] hospital lab) Nasopharyngeal Nasopharyngeal Swab     Status: None   Collection Time: 09/01/18  9:33 PM   Specimen: Nasopharyngeal Swab  Result Value Ref Range Status   SARS Coronavirus 2 NEGATIVE NEGATIVE Final    Comment: (NOTE) If result is NEGATIVE SARS-CoV-2 target nucleic acids are NOT DETECTED. The SARS-CoV-2 RNA is generally detectable in upper and lower  respiratory specimens during the acute phase of infection. The lowest  concentration of SARS-CoV-2 viral copies this assay can detect is 250  copies / mL. Quianna Avery negative result does not preclude SARS-CoV-2 infection  and should not be used as the sole basis for treatment or other  patient management decisions.  Divon Krabill negative result may occur with  improper specimen collection / handling, submission of specimen other  than nasopharyngeal swab, presence of viral mutation(s) within the  areas targeted by this assay, and inadequate number of viral copies  (<250 copies / mL). Ericah Scotto negative result must be combined with clinical  observations, patient history, and epidemiological information. If result is POSITIVE SARS-CoV-2 target nucleic acids are DETECTED. The SARS-CoV-2 RNA is generally detectable in upper and lower  respiratory specimens  dur ing the acute phase of infection.  Positive  results are indicative of active infection with SARS-CoV-2.  Clinical  correlation with patient history and other  diagnostic information is  necessary to determine patient infection status.  Positive results do  not rule out bacterial infection or co-infection with other viruses. If result is PRESUMPTIVE POSTIVE SARS-CoV-2 nucleic acids MAY BE PRESENT.   Almarosa Bohac presumptive positive result was obtained on the submitted specimen  and confirmed on repeat testing.  While 2019 novel coronavirus  (SARS-CoV-2) nucleic acids may be present in the submitted sample  additional confirmatory testing may be necessary for epidemiological  and / or clinical management purposes  to differentiate between  SARS-CoV-2 and other Sarbecovirus currently known to infect humans.  If clinically indicated additional testing with an alternate test  methodology [phone]) is advised. The SARS-CoV-2 RNA is generally  detectable in upper and lower respiratory sp ecimens during the acute  phase of infection. The expected result is Negative. Fact Sheet for Patients:  StrictlyIdeas.no Fact Sheet for Healthcare Providers: BankingDealers.co.za This test is not yet approved or cleared by the [place] FDA and has been authorized for detection and/or diagnosis of SARS-CoV-2 by FDA under an Emergency Use Authorization (EUA).  This EUA will remain in effect (meaning this test can be used) for the duration of the COVID-19 declaration under Section 564(b)(1) of the Act, 21 U.S.C. section 360bbb-3(b)(1), unless the authorization is terminated or revoked sooner. Performed at [facility], [place] [address], [place], [place] [address]          Radiology Studies: Dg Wrist Complete Right  Result Date: 09/01/2018 CLINICAL DATA:  Initial evaluation for acute warmth and swelling about the right wrist. History of  recent fall. EXAM: RIGHT WRIST - COMPLETE 3+ VIEW COMPARISON:  None. FINDINGS: No acute fracture or dislocation. Normal radiocarpal and distal radioulnar articulations maintained. Mild degenerative changes with chondrocalcinosis noted about the wrist. Prominent osteoarthritic changes noted at the first [facility] joint. Mild diffuse soft tissue swelling about the visualized right hand and wrist. IMPRESSION: 1. No acute osseous abnormality. 2. Mild diffuse soft tissue swelling about the visualized right hand and wrist. 3. Moderate degenerative osteoarthritic changes, most notable at the first [facility] joint. Electronically Signed   By: Jeannine Boga M.D.   On: 09/01/2018 22:57   Ct Head Wo Contrast  Result Date: 09/01/2018 CLINICAL DATA:  Fevers.  Dementia.  Head trauma. EXAM: CT HEAD WITHOUT CONTRAST CT CERVICAL SPINE WITHOUT CONTRAST TECHNIQUE: Multidetector CT imaging of the head and cervical spine was performed following the standard protocol without intravenous contrast. Multiplanar CT image reconstructions of the cervical spine were also generated. COMPARISON:  06/18/2018 FINDINGS: CT HEAD FINDINGS Brain: No evidence of acute infarction, hemorrhage, hydrocephalus, extra-axial collection or mass lesion/mass effect. There is mild diffuse low-attenuation within the subcortical and periventricular white matter compatible with chronic microvascular disease. Prominence of sulci and ventricles compatible with brain atrophy. Vascular: No hyperdense vessel or unexpected calcification. Skull: Normal. Negative for fracture or focal lesion. Sinuses/Orbits: No acute finding. Other: None CT CERVICAL SPINE FINDINGS Alignment: Normal. Skull base and vertebrae: No acute fracture. No primary bone lesion or focal pathologic process. Soft tissues and spinal canal: No prevertebral fluid or swelling. No visible canal hematoma. Disc levels: Disc space narrowing in endplate spurring identified at C5-6, C6-7 and C7-T1. Upper chest:  Negative. Other: None IMPRESSION: 1. No acute intracranial abnormality. Advanced chronic small vessel ischemic disease and brain atrophy. 2. No evidence for cervical spine fracture. Multi level cervical spondylosis noted. Electronically Signed   By: Kerby Moors M.D.   On: 09/01/2018 23:19   Ct Angio Chest Pe  W Or Wo Contrast  Result Date: 09/02/2018 CLINICAL DATA:  Fever, dementia, mental status change, recent fall, history of pneumonia and COVID-19 EXAM: CT ANGIOGRAPHY CHEST WITH CONTRAST TECHNIQUE: Multidetector CT imaging of the chest was performed using the standard protocol during bolus administration of intravenous contrast. Multiplanar CT image reconstructions and MIPs were obtained to evaluate the vascular anatomy. CONTRAST:  149mL OMNIPAQUE IOHEXOL 350 MG/ML SOLN COMPARISON:  02/24/2016 FINDINGS: Cardiovascular: Heart size normal. No pericardial effusion. Mildly dilated right pulmonary artery. Satisfactory opacification of pulmonary arteries noted, and there is no evidence of pulmonary emboli. Previous AVR. Scattered coronary calcifications, post CABG. Adequate contrast opacification of the thoracic aorta with no evidence of dissection, aneurysm, or stenosis. There is classic 3-vessel brachiocephalic arch anatomy without proximal stenosis. Coarse moderate atheromatous calcifications in the distal arch and descending thoracic segment. Mild atheromatous change in the proximal visualized abdominal aorta without dilatation. Mediastinum/Nodes: Small hiatal hernia. No pleural or pericardial effusion. Lungs/Pleura: Trace pleural effusions left greater than right. No pneumothorax. Coarse linear opacities peripherally in both upper lobes. Poorly marginated ground-glass opacity centrally in both upper lobes, and left perihilar region. No airspace consolidation. Upper Abdomen: No acute findings. Musculoskeletal: Chest wall hematoma seen previously has resolved. Sternotomy wires. No acute fracture or worrisome  bone lesion. Review of the MIP images confirms the above findings. IMPRESSION: 1. Negative for acute PE or thoracic aortic dissection. 2. Trace bilateral pleural effusions left greater than right. 3. Patchy ground-glass opacities in both upper lobes and left perihilar region, possibly infectious/inflammatory. 4. Coronary and Aortic Atherosclerosis (ICD10-I70.0), post AVR and CABG. Electronically Signed   By: Lucrezia Europe M.D.   On: 09/02/2018 14:13   Ct Cervical Spine Wo Contrast  Result Date: 09/01/2018 CLINICAL DATA:  Fevers.  Dementia.  Head trauma. EXAM: CT HEAD WITHOUT CONTRAST CT CERVICAL SPINE WITHOUT CONTRAST TECHNIQUE: Multidetector CT imaging of the head and cervical spine was performed following the standard protocol without intravenous contrast. Multiplanar CT image reconstructions of the cervical spine were also generated. COMPARISON:  06/18/2018 FINDINGS: CT HEAD FINDINGS Brain: No evidence of acute infarction, hemorrhage, hydrocephalus, extra-axial collection or mass lesion/mass effect. There is mild diffuse low-attenuation within the subcortical and periventricular white matter compatible with chronic microvascular disease. Prominence of sulci and ventricles compatible with brain atrophy. Vascular: No hyperdense vessel or unexpected calcification. Skull: Normal. Negative for fracture or focal lesion. Sinuses/Orbits: No acute finding. Other: None CT CERVICAL SPINE FINDINGS Alignment: Normal. Skull base and vertebrae: No acute fracture. No primary bone lesion or focal pathologic process. Soft tissues and spinal canal: No prevertebral fluid or swelling. No visible canal hematoma. Disc levels: Disc space narrowing in endplate spurring identified at C5-6, C6-7 and C7-T1. Upper chest: Negative. Other: None IMPRESSION: 1. No acute intracranial abnormality. Advanced chronic small vessel ischemic disease and brain atrophy. 2. No evidence for cervical spine fracture. Multi level cervical spondylosis noted.  Electronically Signed   By: Kerby Moors M.D.   On: 09/01/2018 23:19   Ct Wrist Right W Contrast  Result Date: 09/02/2018 CLINICAL DATA:  [place].  Wrist pain. EXAM: CT OF THE UPPER RIGHT EXTREMITY WITH CONTRAST TECHNIQUE: Multidetector CT imaging of the upper right extremity was performed according to the standard protocol following intravenous contrast administration. COMPARISON:  Wrist radiographs 09/01/2018 CONTRAST:  142mL OMNIPAQUE IOHEXOL 350 MG/ML SOLN FINDINGS: The joint spaces are fairly well maintained for the patient's age. Mild degenerative changes. No acute wrist fractures identified. Carpal alignment is normal. Advanced degenerative changes at the [facility]  joint of the thumb but no acute fracture. The visualized metacarpal bones are intact. No fracture of the distal radius or ulna. Slight widening of the scapholunate joint space could suggest Rayhaan Huster ligament tear. Diffuse subcutaneous soft tissue swelling/edema could be posttraumatic or cellulitis. IMPRESSION: 1. No definite acute wrist fracture. 2. Advanced degenerative changes at the [facility] joint of the thumb. 3. Widening of the scapholunate joint space could suggest Kao Conry ligament tear or ligament insufficiency. 4. Diffuse subcutaneous soft tissue swelling. Electronically Signed   By: Marijo Sanes M.D.   On: 09/02/2018 13:49   Dg Chest Port 1 View  Result Date: 09/01/2018 CLINICAL DATA:  Shortness of breath EXAM: PORTABLE CHEST 1 VIEW COMPARISON:  06/18/2018 FINDINGS: Prior median sternotomy and valve replacement. Mild cardiomegaly. Aortic atherosclerosis. No confluent opacity, effusion or edema. No acute bony abnormality. IMPRESSION: No active disease. Electronically Signed   By: Rolm Baptise M.D.   On: 09/01/2018 22:54   Dg Hips Bilat W Or Wo Pelvis 3-4 Views  Result Date: 09/01/2018 CLINICAL DATA:  Initial evaluation for recent trauma, fall. EXAM: DG HIP (WITH OR WITHOUT PELVIS) 3-4V BILAT COMPARISON:  None available. FINDINGS: No acute fracture  dislocation. Femoral heads in normal alignment within the acetabula. Femoral head heights maintained. Bony pelvis intact. Mild osteoarthritic changes present about the hips bilaterally. Degenerative changes noted within the lower lumbar spine. No definite acute soft tissue abnormality. Large volume retained stool seen impacted within the rectal vault. IMPRESSION: 1. No acute osseous abnormality. 2. Mild osteoarthritic changes about the hips bilaterally. 3. Large volume retained stool impacted within the rectal vault, suggesting constipation. Electronically Signed   By: Jeannine Boga M.D.   On: 09/01/2018 23:00        Scheduled Meds:  enoxaparin (LOVENOX) injection  40 mg Subcutaneous Q24H   polyethylene glycol  17 g Oral Daily   sodium chloride (PF)       Continuous Infusions:  sodium chloride     ceFEPime (MAXIPIME) IV 2 g (09/02/18 0555)   vancomycin       LOS: 0 days    Time spent: over 30 min    Fayrene Helper, MD [facility] Pager AMION  If 7PM-7AM, please contact night-coverage [email or web address] Password [facility] 09/02/2018, 2:48 PM

## 2018-09-03 ENCOUNTER — Other Ambulatory Visit: Payer: Self-pay

## 2018-09-03 LAB — COMPREHENSIVE METABOLIC PANEL
ALT: 12 U/L (ref 0–44)
AST: 15 U/L (ref 15–41)
Albumin: 2.4 g/dL — ABNORMAL LOW (ref 3.5–5.0)
Alkaline Phosphatase: 50 U/L (ref 38–126)
Anion gap: 9 (ref 5–15)
BUN: 17 mg/dL (ref 8–23)
CO2: 23 mmol/L (ref 22–32)
Calcium: 7.8 mg/dL — ABNORMAL LOW (ref 8.9–10.3)
Chloride: 106 mmol/L (ref 98–111)
Creatinine, Ser: 0.83 mg/dL (ref 0.61–1.24)
GFR calc Af Amer: >60 mL/min (ref >60)
GFR calc non Af Amer: >60 mL/min (ref >60)
Glucose, Bld: 110 mg/dL — ABNORMAL HIGH (ref 70–99)
Potassium: 3.2 mmol/L — ABNORMAL LOW (ref 3.5–5.1)
Sodium: 138 mmol/L (ref 135–145)
Total Bilirubin: 0.8 mg/dL (ref 0.3–1.2)
Total Protein: 5.4 g/dL — ABNORMAL LOW (ref 6.5–8.1)

## 2018-09-03 LAB — RESPIRATORY PANEL BY PCR

## 2018-09-03 LAB — URINE CULTURE: Culture: NO GROWTH

## 2018-09-03 LAB — CBC
HCT: 32.6 % — ABNORMAL LOW (ref 39.0–52.0)
Hemoglobin: 10.8 g/dL — ABNORMAL LOW (ref 13.0–17.0)
MCH: 32.6 pg (ref 26.0–34.0)
MCHC: 33.1 g/dL (ref 30.0–36.0)
MCV: 98.5 fL (ref 80.0–100.0)
Platelets: 209 10*3/uL (ref 150–400)
RBC: 3.31 MIL/uL — ABNORMAL LOW (ref 4.22–5.81)
RDW: 12.4 % (ref 11.5–15.5)
WBC: 10.4 10*3/uL (ref 4.0–10.5)
nRBC: 0 % (ref 0.0–0.2)

## 2018-09-03 LAB — MAGNESIUM: Magnesium: 2.2 mg/dL (ref 1.7–2.4)

## 2018-09-03 LAB — STREP PNEUMONIAE URINARY ANTIGEN: Strep Pneumo Urinary Antigen: NEGATIVE

## 2018-09-03 LAB — PROCALCITONIN: Procalcitonin: 0.61 ng/mL

## 2018-09-03 MED ORDER — SODIUM CHLORIDE 0.9 % IV SOLN
1.0000 g | INTRAVENOUS | Status: AC
  Administered 2018-09-03 – 2018-09-07 (×5): 1 g via INTRAVENOUS
  Filled 2018-09-03 (×2): qty 10
  Filled 2018-09-03: qty 1
  Filled 2018-09-03: qty 10
  Filled 2018-09-03: qty 1

## 2018-09-03 MED ORDER — PANTOPRAZOLE SODIUM 40 MG PO TBEC
40.0000 mg | DELAYED_RELEASE_TABLET | Freq: Every day | ORAL | Status: DC
  Administered 2018-09-03 – 2018-09-10 (×7): 40 mg via ORAL
  Filled 2018-09-03 (×8): qty 1

## 2018-09-03 MED ORDER — POTASSIUM CHLORIDE CRYS ER 20 MEQ PO TBCR
40.0000 meq | EXTENDED_RELEASE_TABLET | ORAL | Status: AC
  Administered 2018-09-03 (×2): 40 meq via ORAL
  Filled 2018-09-03 (×2): qty 2

## 2018-09-03 MED ORDER — DOXYCYCLINE HYCLATE 100 MG PO TABS
100.0000 mg | ORAL_TABLET | Freq: Two times a day (BID) | ORAL | Status: AC
  Administered 2018-09-03 – 2018-09-08 (×10): 100 mg via ORAL
  Filled 2018-09-03 (×10): qty 1

## 2018-09-03 NOTE — Progress Notes (Addendum)
PROGRESS NOTE    Marvin Wagner  IZT:[id number] DOB: Jul 26, 1930 DOA: 09/01/2018 PCP: Eulas Post, MD  Brief Narrative:  Marvin Wagner  is Marvin Wagner 85 y.o. male,  w CAD s/p CABG, h/o Aortic stenosis s/p AVR,  Dementia, apparently presents with fever x2 days,  Pt is unable to give any meaningful history.  Pt apparently fell sometime last week.  Right wrist slightly swollen.   In ED,  T 100.5, P 129 R 23, Bp 144/87  Pox 91% on RA Wt 77.1, kg  R wrist IMPRESSION: 1. No acute osseous abnormality. 2. Mild diffuse soft tissue swelling about the visualized right hand and wrist. 3. Moderate degenerative osteoarthritic changes, most notable at the first [facility] joint.  CXR IMPRESSION: No active disease.  Xray Hip/ pelvis IMPRESSION: 1. No acute osseous abnormality. 2. Mild osteoarthritic changes about the hips bilaterally. 3. Large volume retained stool impacted within the rectal vault, suggesting constipation.  CT brain IMPRESSION: 1. No acute intracranial abnormality. Advanced chronic small vessel ischemic disease and brain atrophy. 2. No evidence for cervical spine fracture. Multi level cervical spondylosis noted.  covid -19 negative  Urinalysis rbc 11-20, wbc 0-5  Na 133, K 4.0, Bun 20, Creatinine 0.86 Ast 20, Alt 13 Wbc 15.2, Hgb 13.8, Plt 184  INR 1.1  Pt will be admitted for fever secondary to sepsis secondary to cellulitis of right wrist  Assessment & Plan:   Principal Problem:   Sepsis ([place]) Active Problems:   Hx of CABG   Dementia (HCC)   Cellulitis  Fever   Sepsis   Community Acquired Pneumonia vs Cellulitis of R Wrist Concern for R wrist cellulitis on admission, but CT chest with ground glass opacities concerning for infection.   Triage note mentioned RR 30 and increased WOB as well as R wrist warm to touch and swollen Imaging shows edema, though he did have fall within past week -> traumatic vs cellulitis? He was dx with COVID 74 in May and has  since tested negative COVID 19 testing negative at this admission CT chest with patchy ground glass opacities in both upper lobes and L perihilar region, possibly infectious/inflammatory Will continue vanc/cefepime -> will narrow to ceftriaxone/doxycycline to cover both possible cellulitis and CAP. Follow MRSA PCR Sputum (pending), urine legionella (pending), urine strep (negative) RVP Blood - 1 of 2 with coag negative staph, suspect contaminant  urine culture with no growth SLP eval Procalcitonin elevated, WBC improving COVID 19 negative x2.  Low suspicion given infection in May.  Discussed discontinuing precautions with ID who agreed.  Tachycardia improved  CAD s/p CABG, AVR Cont Aspirin 81mg  po qday Not taking lipitor per med rec Cont Toprol XL 25mg  po qday  Dementia Per med rec, not taking aricept or namenda Cont Risperdal 0.25mg  po BID  DVT prophylaxis: lovenox Code Status: DNR Family Communication: none at bedside - called Mrs. Johns 8/2.  No answer 8/3, left message. Disposition Plan: pending further w/u and improvement   Consultants:   none  Procedures:   none  Antimicrobials:  Anti-infectives (From admission, onward)   Start     Dose/Rate Route Frequency Ordered Stop   09/03/18 2200  doxycycline (VIBRA-TABS) tablet 100 mg     100 mg Oral Every 12 hours 09/03/18 1619     09/03/18 2200  cefTRIAXone (ROCEPHIN) 1 g in sodium chloride 0.9 % 100 mL IVPB     1 g 200 mL/hr over 30 Minutes Intravenous Every 24 hours 09/03/18 1619  09/02/18 1800  vancomycin (VANCOCIN) 1,750 mg in sodium chloride 0.9 % 500 mL IVPB  Status:  Discontinued     1,750 mg 250 mL/hr over 120 Minutes Intravenous Every 24 hours 09/02/18 0347 09/03/18 1619   09/02/18 0600  ceFEPIme (MAXIPIME) 2 g in sodium chloride 0.9 % 100 mL IVPB  Status:  Discontinued     2 g 200 mL/hr over 30 Minutes Intravenous Every 8 hours 09/02/18 0347 09/03/18 1619   09/01/18 2130  ceFEPIme (MAXIPIME) 2 g in  sodium chloride 0.9 % 100 mL IVPB     2 g 200 mL/hr over 30 Minutes Intravenous  Once 09/01/18 2117 09/01/18 2306   09/01/18 2130  metroNIDAZOLE (FLAGYL) IVPB 500 mg     500 mg 100 mL/hr over 60 Minutes Intravenous  Once 09/01/18 2117 09/02/18 0047   09/01/18 2130  vancomycin (VANCOCIN) IVPB 1000 mg/200 mL premix  Status:  Discontinued     1,000 mg 200 mL/hr over 60 Minutes Intravenous  Once 09/01/18 2117 09/01/18 2119   09/01/18 2130  vancomycin (VANCOCIN) 1,500 mg in sodium chloride 0.9 % 500 mL IVPB     1,500 mg 250 mL/hr over 120 Minutes Intravenous  Once 09/01/18 2119 09/02/18 0107         Subjective: Unable to communicate with dementia  Objective: Vitals:   09/02/18 2159 09/03/18 0605 09/03/18 0624 09/03/18 1329  BP: (!) 146/65 (!) 156/97  122/73  Pulse: 93 (!) 108  (!) 104  Resp:  20  18  Temp:  98.7 F (37.1 C)  99.7 F (37.6 C)  TempSrc:  Axillary  Oral  SpO2:  98%  95%  Weight:   78.7 kg   Height:        Intake/Output Summary (Last 24 hours) at 09/03/2018 1749 Last data filed at 09/03/2018 0900 Gross per 24 hour  Intake 714.54 ml  Output 100 ml  Net 614.54 ml   Filed Weights   09/01/18 2113 09/02/18 0600 09/03/18 0624  Weight: 77.1 kg 78.4 kg 78.7 kg    Examination:  General: No acute distress. Cardiovascular: Heart sounds show Marvin Wagner regular rate, and rhythm Lungs: Clear to auscultation bilaterally  Abdomen: Soft, nontender, nondistended  Neurological: Disoriented, unable to communicate with dementia. Moves all extremities 4. Cranial nerves II through XII grossly intact. Skin: mild redness and warmth to R wrist, does not seem tender to touch Extremities: No clubbing or cyanosis. No edema.   Data Reviewed: I have personally reviewed following labs and imaging studies  CBC: Recent Labs  Lab 09/01/18 2128 09/02/18 0455 09/03/18 0449  WBC 15.2* 12.4* 10.4  NEUTROABS 11.9*  --   --   HGB 13.8 11.1* 10.8*  HCT 40.9 33.9* 32.6*  MCV 98.6 99.1 98.5    PLT 184 195 175   Basic Metabolic Panel: Recent Labs  Lab 09/01/18 2128 09/02/18 0455 09/03/18 0449  NA 133* 137 138  K 4.0 3.6 3.2*  CL 100 105 106  CO2 21* 22 23  GLUCOSE 111* 103* 110*  BUN 20 19 17   CREATININE 0.86 0.79 0.83  CALCIUM 8.6* 7.7* 7.8*  MG  --   --  2.2   GFR: Estimated Creatinine Clearance: 71.4 mL/min (by C-G formula based on SCr of 0.83 mg/dL). Liver Function Tests: Recent Labs  Lab 09/01/18 2128 09/02/18 0455 09/03/18 0449  AST 20 13* 15  ALT 13 10 12   ALKPHOS 64 50 50  BILITOT 1.2 0.9 0.8  PROT 6.9 5.5* 5.4*  ALBUMIN  3.2* 2.6* 2.4*   No results for input(s): LIPASE, AMYLASE in the last 168 hours. No results for input(s): AMMONIA in the last 168 hours. Coagulation Profile: Recent Labs  Lab 09/01/18 2128  INR 1.1   Cardiac Enzymes: No results for input(s): CKTOTAL, CKMB, CKMBINDEX, TROPONINI in the last 168 hours. BNP (last 3 results) No results for input(s): PROBNP in the last 8760 hours. HbA1C: No results for input(s): HGBA1C in the last 72 hours. CBG: Recent Labs  Lab 09/01/18 2131  GLUCAP 94   Lipid Profile: No results for input(s): CHOL, HDL, LDLCALC, TRIG, CHOLHDL, LDLDIRECT in the last 72 hours. Thyroid Function Tests: No results for input(s): TSH, T4TOTAL, FREET4, T3FREE, THYROIDAB in the last 72 hours. Anemia Panel: No results for input(s): VITAMINB12, FOLATE, FERRITIN, TIBC, IRON, RETICCTPCT in the last 72 hours. Sepsis Labs: Recent Labs  Lab 09/01/18 2128 09/02/18 1507 09/03/18 0449  PROCALCITON  --  0.75 0.61  LATICACIDVEN 1.7  --   --     Recent Results (from the past 240 hour(s))  Blood Culture (routine x 2)     Status: None (Preliminary result)   Collection Time: 09/01/18  9:29 PM   Specimen: BLOOD  Result Value Ref Range Status   Specimen Description   Final    BLOOD LEFT ANTECUBITAL Performed at [facility], [place] [address], [place], [place] [address]    Special Requests   Final     BOTTLES DRAWN AEROBIC AND ANAEROBIC Blood Culture results may not be optimal due to an excessive volume of blood received in culture bottles Performed at [place] [address] [place], [place] [address]    Culture  Setup Time   Final    GRAM POSITIVE COCCI IN CLUSTERS AEROBIC BOTTLE ONLY CRITICAL RESULT CALLED TO, READ BACK BY AND VERIFIED WITH: J GRIMSLEY,PHARMD AT 2240 09/02/2018 BY L BENFIELD    Culture   Final    GRAM POSITIVE COCCI TOO YOUNG TO READ CULTURE REINCUBATED FOR BETTER GROWTH Performed at [facility], [place] [address], [place], [place] [address]    Report Status PENDING  Incomplete  Urine culture     Status: None   Collection Time: 09/01/18  9:29 PM   Specimen: In/Out Cath Urine  Result Value Ref Range Status   Specimen Description   Final    IN/OUT CATH URINE Performed at [place] [address], [place], [place] [address]    Special Requests   Final    NONE Performed at [facility], [place] [address], [place], [place] [address]    Culture   Final    NO GROWTH Performed at [facility], [place] [address], [place], [place] [address]    Report Status 09/03/2018 FINAL  Final  Blood Culture ID Panel (Reflexed)     Status: Abnormal   Collection Time: 09/01/18  9:29 PM  Result Value Ref Range Status   Enterococcus species NOT DETECTED NOT DETECTED Final   Listeria monocytogenes NOT DETECTED NOT DETECTED Final   Staphylococcus species DETECTED (Marvin Wagner) NOT DETECTED Final    Comment: Methicillin (oxacillin) resistant coagulase negative staphylococcus. Possible blood culture contaminant (unless isolated from more than one blood culture draw or clinical case suggests pathogenicity). No antibiotic treatment is indicated for blood  culture contaminants. CRITICAL RESULT CALLED TO, READ BACK BY AND VERIFIED WITH: J GRIMSLEY,PHARMD AT 2040 09/02/2018 BY L BENFIELD    Staphylococcus aureus (BCID) NOT  DETECTED  NOT DETECTED Final   Methicillin resistance DETECTED (Marvin Wagner) NOT DETECTED Final    Comment: CRITICAL RESULT CALLED TO, READ BACK BY AND VERIFIED WITH: J GRIMSLEY,PHARMD AT 2040 09/02/2018 BY L BENFIELD    Streptococcus species NOT DETECTED NOT DETECTED Final   Streptococcus agalactiae NOT DETECTED NOT DETECTED Final   Streptococcus pneumoniae NOT DETECTED NOT DETECTED Final   Streptococcus pyogenes NOT DETECTED NOT DETECTED Final   Acinetobacter baumannii NOT DETECTED NOT DETECTED Final   Enterobacteriaceae species NOT DETECTED NOT DETECTED Final   Enterobacter cloacae complex NOT DETECTED NOT DETECTED Final   Escherichia coli NOT DETECTED NOT DETECTED Final   Klebsiella oxytoca NOT DETECTED NOT DETECTED Final   Klebsiella pneumoniae NOT DETECTED NOT DETECTED Final   Proteus species NOT DETECTED NOT DETECTED Final   Serratia marcescens NOT DETECTED NOT DETECTED Final   Haemophilus influenzae NOT DETECTED NOT DETECTED Final   Neisseria meningitidis NOT DETECTED NOT DETECTED Final   Pseudomonas aeruginosa NOT DETECTED NOT DETECTED Final   Candida albicans NOT DETECTED NOT DETECTED Final   Candida glabrata NOT DETECTED NOT DETECTED Final   Candida krusei NOT DETECTED NOT DETECTED Final   Candida parapsilosis NOT DETECTED NOT DETECTED Final   Candida tropicalis NOT DETECTED NOT DETECTED Final    Comment: Performed at [facility], [place]. [address], [place], [place] [address]  Blood Culture (routine x 2)     Status: None (Preliminary result)   Collection Time: 09/01/18  9:33 PM   Specimen: BLOOD RIGHT FOREARM  Result Value Ref Range Status   Specimen Description   Final    BLOOD RIGHT FOREARM Performed at [facility], [place] [address], [place], [place] [address]    Special Requests   Final    BOTTLES DRAWN AEROBIC AND ANAEROBIC Blood Culture results may not be optimal due to an excessive volume of blood received in culture bottles Performed at [place] [address], [place], [place] [address]    Culture   Final    NO GROWTH 2 DAYS Performed at [place] [address] [place], [place] [address]    Report Status PENDING  Incomplete  SARS Coronavirus 2 [facility] order, Performed in [facility] hospital lab) Nasopharyngeal Nasopharyngeal Swab     Status: None   Collection Time: 09/01/18  9:33 PM   Specimen: Nasopharyngeal Swab  Result Value Ref Range Status   SARS Coronavirus 2 NEGATIVE NEGATIVE Final    Comment: (NOTE) If result is NEGATIVE SARS-CoV-2 target nucleic acids are NOT DETECTED. The SARS-CoV-2 RNA is generally detectable in upper and lower  respiratory specimens during the acute phase of infection. The lowest  concentration of SARS-CoV-2 viral copies this assay can detect is 250  copies / mL. Marvin Wagner negative result does not preclude SARS-CoV-2 infection  and should not be used as the sole basis for treatment or other  patient management decisions.  Marvin Wagner negative result may occur with  improper specimen collection / handling, submission of specimen other  than nasopharyngeal swab, presence of viral mutation(s) within the  areas targeted by this assay, and inadequate number of viral copies  (<250 copies / mL). Marvin Wagner negative result must be combined with clinical  observations, patient history, and epidemiological information. If result is POSITIVE SARS-CoV-2 target nucleic acids are DETECTED. The SARS-CoV-2 RNA is generally detectable in upper and lower  respiratory specimens dur ing the acute phase of infection.  Positive  results are indicative of active  infection with SARS-CoV-2.  Clinical  correlation with patient history and other diagnostic information is  necessary to determine patient infection status.  Positive results do  not rule out bacterial infection or co-infection with other viruses. If result is PRESUMPTIVE POSTIVE SARS-CoV-2 nucleic acids MAY BE PRESENT.   Sarea Fyfe presumptive positive result was  obtained on the submitted specimen  and confirmed on repeat testing.  While 2019 novel coronavirus  (SARS-CoV-2) nucleic acids may be present in the submitted sample  additional confirmatory testing may be necessary for epidemiological  and / or clinical management purposes  to differentiate between  SARS-CoV-2 and other Sarbecovirus currently known to infect humans.  If clinically indicated additional testing with an alternate test  methodology [phone]) is advised. The SARS-CoV-2 RNA is generally  detectable in upper and lower respiratory sp ecimens during the acute  phase of infection. The expected result is Negative. Fact Sheet for Patients:  StrictlyIdeas.no Fact Sheet for Healthcare Providers: BankingDealers.co.za This test is not yet approved or cleared by the [place] FDA and has been authorized for detection and/or diagnosis of SARS-CoV-2 by FDA under an Emergency Use Authorization (EUA).  This EUA will remain in effect (meaning this test can be used) for the duration of the COVID-19 declaration under Section 564(b)(1) of the Act, 21 U.S.C. section 360bbb-3(b)(1), unless the authorization is terminated or revoked sooner. Performed at [facility], [place] [address], [place], [place] [address]   Respiratory Panel by PCR     Status: None   Collection Time: 09/02/18  2:45 PM   Specimen: Nasopharyngeal Swab; Respiratory  Result Value Ref Range Status   Adenovirus NOT DETECTED NOT DETECTED Final   Coronavirus 229E NOT DETECTED NOT DETECTED Final    Comment: (NOTE) The Coronavirus on the Respiratory Panel, DOES NOT test for the novel  Coronavirus (2019 nCoV)    Coronavirus HKU1 NOT DETECTED NOT DETECTED Final   Coronavirus NL63 NOT DETECTED NOT DETECTED Final   Coronavirus OC43 NOT DETECTED NOT DETECTED Final   Metapneumovirus NOT DETECTED NOT DETECTED Final   Rhinovirus / Enterovirus NOT DETECTED NOT  DETECTED Final   Influenza Marvin Wagner NOT DETECTED NOT DETECTED Final   Influenza B NOT DETECTED NOT DETECTED Final   Parainfluenza Virus 1 NOT DETECTED NOT DETECTED Final   Parainfluenza Virus 2 NOT DETECTED NOT DETECTED Final   Parainfluenza Virus 3 NOT DETECTED NOT DETECTED Final   Parainfluenza Virus 4 NOT DETECTED NOT DETECTED Final   Respiratory Syncytial Virus NOT DETECTED NOT DETECTED Final   Bordetella pertussis NOT DETECTED NOT DETECTED Final   Chlamydophila pneumoniae NOT DETECTED NOT DETECTED Final   Mycoplasma pneumoniae NOT DETECTED NOT DETECTED Final    Comment: Performed at [facility], [place]. [address] [place], [place] [address]  MRSA PCR Screening     Status: None   Collection Time: 09/02/18  2:45 PM   Specimen: Nasopharyngeal  Result Value Ref Range Status   MRSA by PCR NEGATIVE NEGATIVE Final    Comment:        The GeneXpert MRSA Assay (FDA approved for NASAL specimens only), is one component of Marvin Wagner comprehensive MRSA colonization surveillance program. It is not intended to diagnose MRSA infection nor to guide or monitor treatment for MRSA infections. Performed at [facility], [place] [address] [place], [place] [address]   SARS Coronavirus 2 [facility] order, Performed in [facility] hospital lab)     Status: None   Collection Time: 09/02/18  6:04  PM  Result Value Ref Range Status   SARS Coronavirus 2 NEGATIVE NEGATIVE Final    Comment: (NOTE) If result is NEGATIVE SARS-CoV-2 target nucleic acids are NOT DETECTED. The SARS-CoV-2 RNA is generally detectable in upper and lower  respiratory specimens during the acute phase of infection. The lowest  concentration of SARS-CoV-2 viral copies this assay can detect is 250  copies / mL. Kindal Ponti negative result does not preclude SARS-CoV-2 infection  and should not be used as the sole basis for treatment or other  patient management decisions.  Marvin Wagner negative result may occur with  improper specimen collection /  handling, submission of specimen other  than nasopharyngeal swab, presence of viral mutation(s) within the  areas targeted by this assay, and inadequate number of viral copies  (<250 copies / mL). Marvin Wagner negative result must be combined with clinical  observations, patient history, and epidemiological information. If result is POSITIVE SARS-CoV-2 target nucleic acids are DETECTED. The SARS-CoV-2 RNA is generally detectable in upper and lower  respiratory specimens dur ing the acute phase of infection.  Positive  results are indicative of active infection with SARS-CoV-2.  Clinical  correlation with patient history and other diagnostic information is  necessary to determine patient infection status.  Positive results do  not rule out bacterial infection or co-infection with other viruses. If result is PRESUMPTIVE POSTIVE SARS-CoV-2 nucleic acids MAY BE PRESENT.   Marvin Wagner presumptive positive result was obtained on the submitted specimen  and confirmed on repeat testing.  While 2019 novel coronavirus  (SARS-CoV-2) nucleic acids may be present in the submitted sample  additional confirmatory testing may be necessary for epidemiological  and / or clinical management purposes  to differentiate between  SARS-CoV-2 and other Sarbecovirus currently known to infect humans.  If clinically indicated additional testing with an alternate test  methodology [phone]) is advised. The SARS-CoV-2 RNA is generally  detectable in upper and lower respiratory sp ecimens during the acute  phase of infection. The expected result is Negative. Fact Sheet for Patients:  StrictlyIdeas.no Fact Sheet for Healthcare Providers: BankingDealers.co.za This test is not yet approved or cleared by the [place] FDA and has been authorized for detection and/or diagnosis of SARS-CoV-2 by FDA under an Emergency Use Authorization (EUA).  This EUA will remain in effect (meaning this  test can be used) for the duration of the COVID-19 declaration under Section 564(b)(1) of the Act, 21 U.S.C. section 360bbb-3(b)(1), unless the authorization is terminated or revoked sooner. Performed at [facility], [place] [address] [place], [place] [address]          Radiology Studies: Dg Wrist Complete Right  Result Date: 09/01/2018 CLINICAL DATA:  Initial evaluation for acute warmth and swelling about the right wrist. History of recent fall. EXAM: RIGHT WRIST - COMPLETE 3+ VIEW COMPARISON:  None. FINDINGS: No acute fracture or dislocation. Normal radiocarpal and distal radioulnar articulations maintained. Mild degenerative changes with chondrocalcinosis noted about the wrist. Prominent osteoarthritic changes noted at the first [facility] joint. Mild diffuse soft tissue swelling about the visualized right hand and wrist. IMPRESSION: 1. No acute osseous abnormality. 2. Mild diffuse soft tissue swelling about the visualized right hand and wrist. 3. Moderate degenerative osteoarthritic changes, most notable at the first [facility] joint. Electronically Signed   By: Jeannine Boga M.D.   On: 09/01/2018 22:57   Ct Head Wo Contrast  Result Date: 09/01/2018 CLINICAL DATA:  Fevers.  Dementia.  Head trauma. EXAM: CT HEAD WITHOUT CONTRAST CT  CERVICAL SPINE WITHOUT CONTRAST TECHNIQUE: Multidetector CT imaging of the head and cervical spine was performed following the standard protocol without intravenous contrast. Multiplanar CT image reconstructions of the cervical spine were also generated. COMPARISON:  06/18/2018 FINDINGS: CT HEAD FINDINGS Brain: No evidence of acute infarction, hemorrhage, hydrocephalus, extra-axial collection or mass lesion/mass effect. There is mild diffuse low-attenuation within the subcortical and periventricular white matter compatible with chronic microvascular disease. Prominence of sulci and ventricles compatible with brain atrophy. Vascular: No hyperdense vessel or  unexpected calcification. Skull: Normal. Negative for fracture or focal lesion. Sinuses/Orbits: No acute finding. Other: None CT CERVICAL SPINE FINDINGS Alignment: Normal. Skull base and vertebrae: No acute fracture. No primary bone lesion or focal pathologic process. Soft tissues and spinal canal: No prevertebral fluid or swelling. No visible canal hematoma. Disc levels: Disc space narrowing in endplate spurring identified at C5-6, C6-7 and C7-T1. Upper chest: Negative. Other: None IMPRESSION: 1. No acute intracranial abnormality. Advanced chronic small vessel ischemic disease and brain atrophy. 2. No evidence for cervical spine fracture. Multi level cervical spondylosis noted. Electronically Signed   By: Kerby Moors M.D.   On: 09/01/2018 23:19   Ct Angio Chest Pe W Or Wo Contrast  Result Date: 09/02/2018 CLINICAL DATA:  Fever, dementia, mental status change, recent fall, history of pneumonia and COVID-19 EXAM: CT ANGIOGRAPHY CHEST WITH CONTRAST TECHNIQUE: Multidetector CT imaging of the chest was performed using the standard protocol during bolus administration of intravenous contrast. Multiplanar CT image reconstructions and MIPs were obtained to evaluate the vascular anatomy. CONTRAST:  17mL OMNIPAQUE IOHEXOL 350 MG/ML SOLN COMPARISON:  02/24/2016 FINDINGS: Cardiovascular: Heart size normal. No pericardial effusion. Mildly dilated right pulmonary artery. Satisfactory opacification of pulmonary arteries noted, and there is no evidence of pulmonary emboli. Previous AVR. Scattered coronary calcifications, post CABG. Adequate contrast opacification of the thoracic aorta with no evidence of dissection, aneurysm, or stenosis. There is classic 3-vessel brachiocephalic arch anatomy without proximal stenosis. Coarse moderate atheromatous calcifications in the distal arch and descending thoracic segment. Mild atheromatous change in the proximal visualized abdominal aorta without dilatation. Mediastinum/Nodes: Small  hiatal hernia. No pleural or pericardial effusion. Lungs/Pleura: Trace pleural effusions left greater than right. No pneumothorax. Coarse linear opacities peripherally in both upper lobes. Poorly marginated ground-glass opacity centrally in both upper lobes, and left perihilar region. No airspace consolidation. Upper Abdomen: No acute findings. Musculoskeletal: Chest wall hematoma seen previously has resolved. Sternotomy wires. No acute fracture or worrisome bone lesion. Review of the MIP images confirms the above findings. IMPRESSION: 1. Negative for acute PE or thoracic aortic dissection. 2. Trace bilateral pleural effusions left greater than right. 3. Patchy ground-glass opacities in both upper lobes and left perihilar region, possibly infectious/inflammatory. 4. Coronary and Aortic Atherosclerosis (ICD10-I70.0), post AVR and CABG. Electronically Signed   By: Lucrezia Europe M.D.   On: 09/02/2018 14:13   Ct Cervical Spine Wo Contrast  Result Date: 09/01/2018 CLINICAL DATA:  Fevers.  Dementia.  Head trauma. EXAM: CT HEAD WITHOUT CONTRAST CT CERVICAL SPINE WITHOUT CONTRAST TECHNIQUE: Multidetector CT imaging of the head and cervical spine was performed following the standard protocol without intravenous contrast. Multiplanar CT image reconstructions of the cervical spine were also generated. COMPARISON:  06/18/2018 FINDINGS: CT HEAD FINDINGS Brain: No evidence of acute infarction, hemorrhage, hydrocephalus, extra-axial collection or mass lesion/mass effect. There is mild diffuse low-attenuation within the subcortical and periventricular white matter compatible with chronic microvascular disease. Prominence of sulci and ventricles compatible with brain atrophy. Vascular: No hyperdense  vessel or unexpected calcification. Skull: Normal. Negative for fracture or focal lesion. Sinuses/Orbits: No acute finding. Other: None CT CERVICAL SPINE FINDINGS Alignment: Normal. Skull base and vertebrae: No acute fracture. No primary  bone lesion or focal pathologic process. Soft tissues and spinal canal: No prevertebral fluid or swelling. No visible canal hematoma. Disc levels: Disc space narrowing in endplate spurring identified at C5-6, C6-7 and C7-T1. Upper chest: Negative. Other: None IMPRESSION: 1. No acute intracranial abnormality. Advanced chronic small vessel ischemic disease and brain atrophy. 2. No evidence for cervical spine fracture. Multi level cervical spondylosis noted. Electronically Signed   By: Kerby Moors M.D.   On: 09/01/2018 23:19   Ct Wrist Right W Contrast  Result Date: 09/02/2018 CLINICAL DATA:  [place].  Wrist pain. EXAM: CT OF THE UPPER RIGHT EXTREMITY WITH CONTRAST TECHNIQUE: Multidetector CT imaging of the upper right extremity was performed according to the standard protocol following intravenous contrast administration. COMPARISON:  Wrist radiographs 09/01/2018 CONTRAST:  136mL OMNIPAQUE IOHEXOL 350 MG/ML SOLN FINDINGS: The joint spaces are fairly well maintained for the patient's age. Mild degenerative changes. No acute wrist fractures identified. Carpal alignment is normal. Advanced degenerative changes at the [facility] joint of the thumb but no acute fracture. The visualized metacarpal bones are intact. No fracture of the distal radius or ulna. Slight widening of the scapholunate joint space could suggest Evie Croston ligament tear. Diffuse subcutaneous soft tissue swelling/edema could be posttraumatic or cellulitis. IMPRESSION: 1. No definite acute wrist fracture. 2. Advanced degenerative changes at the [facility] joint of the thumb. 3. Widening of the scapholunate joint space could suggest Shayleigh Bouldin ligament tear or ligament insufficiency. 4. Diffuse subcutaneous soft tissue swelling. Electronically Signed   By: Marijo Sanes M.D.   On: 09/02/2018 13:49   Dg Chest Port 1 View  Result Date: 09/01/2018 CLINICAL DATA:  Shortness of breath EXAM: PORTABLE CHEST 1 VIEW COMPARISON:  06/18/2018 FINDINGS: Prior median sternotomy and valve  replacement. Mild cardiomegaly. Aortic atherosclerosis. No confluent opacity, effusion or edema. No acute bony abnormality. IMPRESSION: No active disease. Electronically Signed   By: Rolm Baptise M.D.   On: 09/01/2018 22:54   Dg Hips Bilat W Or Wo Pelvis 3-4 Views  Result Date: 09/01/2018 CLINICAL DATA:  Initial evaluation for recent trauma, fall. EXAM: DG HIP (WITH OR WITHOUT PELVIS) 3-4V BILAT COMPARISON:  None available. FINDINGS: No acute fracture dislocation. Femoral heads in normal alignment within the acetabula. Femoral head heights maintained. Bony pelvis intact. Mild osteoarthritic changes present about the hips bilaterally. Degenerative changes noted within the lower lumbar spine. No definite acute soft tissue abnormality. Large volume retained stool seen impacted within the rectal vault. IMPRESSION: 1. No acute osseous abnormality. 2. Mild osteoarthritic changes about the hips bilaterally. 3. Large volume retained stool impacted within the rectal vault, suggesting constipation. Electronically Signed   By: Jeannine Boga M.D.   On: 09/01/2018 23:00        Scheduled Meds:  aspirin EC  81 mg Oral Daily   doxycycline  100 mg Oral Q12H   enoxaparin (LOVENOX) injection  40 mg Subcutaneous Q24H   Melatonin  3 mg Oral QHS   metoprolol succinate  12.5 mg Oral Daily   pantoprazole  40 mg Oral Daily   risperiDONE  0.25 mg Oral BID   Continuous Infusions:  sodium chloride 1,000 mL (09/02/18 1501)   cefTRIAXone (ROCEPHIN)  IV       LOS: 1 day    Time spent: over 30 min  Fayrene Helper, MD [facility] Pager AMION  If 7PM-7AM, please contact night-coverage [email or web address] Password [email or web address] 09/03/2018, 5:49 PM

## 2018-09-03 NOTE — Progress Notes (Signed)
PHARMACY - PHYSICIAN COMMUNICATION CRITICAL VALUE ALERT - BLOOD CULTURE IDENTIFICATION (BCID)  Marvin Wagner is an 85 y.o. male who presented to [facility] on 09/01/2018 with a chief complaint of fever, SOB  Assessment:  Patient already on Vancomycin, cefepime.  BCID could be contamination.   (include suspected source if known)  Name of physician (or Provider) Contacted: Marvin Wagner  Current antibiotics: Vancomycin, cefepime   Changes to prescribed antibiotics recommended:  Patient is on recommended antibiotics - No changes needed  Results for orders placed or performed during the hospital encounter of 09/01/18  Blood Culture ID Panel (Reflexed) (Collected: 09/01/2018  9:29 PM)  Result Value Ref Range   Enterococcus species NOT DETECTED NOT DETECTED   Listeria monocytogenes NOT DETECTED NOT DETECTED   Staphylococcus species DETECTED (A) NOT DETECTED   Staphylococcus aureus (BCID) NOT DETECTED NOT DETECTED   Methicillin resistance DETECTED (A) NOT DETECTED   Streptococcus species NOT DETECTED NOT DETECTED   Streptococcus agalactiae NOT DETECTED NOT DETECTED   Streptococcus pneumoniae NOT DETECTED NOT DETECTED   Streptococcus pyogenes NOT DETECTED NOT DETECTED   Acinetobacter baumannii NOT DETECTED NOT DETECTED   Enterobacteriaceae species NOT DETECTED NOT DETECTED   Enterobacter cloacae complex NOT DETECTED NOT DETECTED   Escherichia coli NOT DETECTED NOT DETECTED   Klebsiella oxytoca NOT DETECTED NOT DETECTED   Klebsiella pneumoniae NOT DETECTED NOT DETECTED   Proteus species NOT DETECTED NOT DETECTED   Serratia marcescens NOT DETECTED NOT DETECTED   Haemophilus influenzae NOT DETECTED NOT DETECTED   Neisseria meningitidis NOT DETECTED NOT DETECTED   Pseudomonas aeruginosa NOT DETECTED NOT DETECTED   Candida albicans NOT DETECTED NOT DETECTED   Candida glabrata NOT DETECTED NOT DETECTED   Candida krusei NOT DETECTED NOT DETECTED   Candida parapsilosis NOT DETECTED NOT DETECTED   Candida tropicalis NOT DETECTED NOT DETECTED    Marvin Wagner 09/03/2018  4:36 AM

## 2018-09-03 NOTE — Evaluation (Signed)
Clinical/Bedside Swallow Evaluation Patient Details  Name: Marvin Wagner MRN: [id number] Date of Birth: October 27, 1930  Today's Date: 09/03/2018 Time: SLP Start Time (ACUTE ONLY): 1048 SLP Stop Time (ACUTE ONLY): 1105 SLP Time Calculation (min) (ACUTE ONLY): 17 min  Past Medical History:  Past Medical History:  Diagnosis Date  . Aortic insufficiency and aortic stenosis   . Aortic stenosis    SEVERE  . CAD (coronary artery disease)   . Heart murmur   . Hyperlipidemia   . Hypertension   . Mild memory disturbance    Past Surgical History:  Past Surgical History:  Procedure Laterality Date  . AORTIC VALVE REPLACEMENT  12/17/08  . CARDIAC CATHETERIZATION    . CORONARY ANGIOPLASTY     NORMAL LEFT VENTRICULAR SIZEAND CONTRACTILITY WITH NORMAL SYSTOLIC FUNCTION. EF 65%  . TONSILLECTOMY     HPI:  85 yo male admitted with right arm cellulitis, fevers, recent fall.  PMH + for chronic pons and white matter ischemia,dementia, CAD s/p CABG, h/o Aortic stenosis s/p AVR, COVID + in May 2020. Concern present for constipation and possibly HCAP - ground glass opacities noted.  Swallow evaluation ordered.   Assessment / Plan / Recommendation Clinical Impression  Patient presents with clinical indications concerning for possible esophageal dysphagia/?reflux.  He is awake and will consume po but does not follow directions nor attempt to self feed. Discoordinated swallow x1 with coffee resulted in pt overtly coughing immediately post-swallow but pt clearly did not want the coffee evidenced by wincing and holding it in his mouth.  No further incidents with 5 other boluses.  Pt provided with intake of pancakes and slow mastication evident but no residuals.  When SLP went back into room to post swallow precaution sign and lower pt's HOB, he immediately coughed with delayed swallow despite cues to expectorate.  He was then observed to drool anterior from right labial region - ? waterbrash.  Set up oral suction for  use and advised RN/NT to recommendations.  Recommend strict aspiration and esophageal precautions.  Will follow up briefly to assure tolerance and for family education given his CT chest findings.  Advised RN. SLP Visit Diagnosis: Dysphagia, unspecified (R13.10)    Aspiration Risk  Moderate aspiration risk    Diet Recommendation Regular;Thin liquid   Liquid Administration via: Cup;Straw Medication Administration: Whole meds with puree Supervision: Staff to assist with self feeding Compensations: Minimize environmental distractions;Slow rate;Small sips/bites Postural Changes: Seated upright at 90 degrees;Remain upright for at least 30 minutes after po intake    Other  Recommendations Oral Care Recommendations: Oral care BID Other Recommendations: Have oral suction available   Follow up Recommendations        Frequency and Duration min 2x/week  1 week       Prognosis Prognosis for Safe Diet Advancement: Good Barriers to Reach Goals: Cognitive deficits      Swallow Study   General Date of Onset: 09/03/18 HPI: 85 yo male admitted with right arm cellulitis, fevers, recent fall.  PMH + for chronic pons and white matter ischemia,dementia, CAD s/p CABG, h/o Aortic stenosis s/p AVR, COVID + in May 2020. Concern present for constipation and possibly HCAP - ground glass opacities noted.  Swallow evaluation ordered. Type of Study: Bedside Swallow Evaluation Previous Swallow Assessment: seen clinically during prior admissions Diet Prior to this Study: Regular;Thin liquids Temperature Spikes Noted: No Respiratory Status: Nasal cannula History of Recent Intubation: No Behavior/Cognition: Alert;Lethargic/Drowsy Oral Cavity Assessment: Within Functional Limits(from portions able to view) Oral  Cavity - Dentition: Other (Comment)(? dentures, pt did not allow SLP to fully view his oral cavity) Vision: Impaired for self-feeding Self-Feeding Abilities: Total assist Patient Positioning: Upright  in bed Baseline Vocal Quality: Low vocal intensity Volitional Cough: Cognitively unable to elicit Volitional Swallow: Unable to elicit    Oral/Motor/Sensory Function Overall Oral Motor/Sensory Function: Generalized oral weakness   Ice Chips Ice chips: Not tested   Thin Liquid Thin Liquid: Impaired Presentation: Straw;Spoon Oral Phase Impairments: Reduced labial seal(reduced labial seal at times but able to be prompted via tsp and transition into straw) Oral Phase Functional Implications: Prolonged oral transit(suspect mild oral holding) Pharyngeal  Phase Impairments: Cough - Immediate Other Comments: immediate cough x 1/6 swallows - coffee administered and swallow appeared discoordinated, pt clearly winced and did not appreciate the coffee- thus suspect singular event    Nectar Thick Nectar Thick Liquid: Not tested   Honey Thick Honey Thick Liquid: Not tested   Puree Puree: Within functional limits Presentation: Spoon   Solid     Solid: Impaired Presentation: Spoon Oral Phase Functional Implications: Other (comment)(prolonged mastication)      Macario Golds 09/03/2018,11:18 AM   Luanna Salk, MS [facility] SLP Acute Rehab Services Pager [phone] Office [phone]

## 2018-09-03 NOTE — Progress Notes (Signed)
Spoke with Daughter Katharine Look, updated on patient status.

## 2018-09-04 LAB — PROCALCITONIN: Procalcitonin: 0.46 ng/mL

## 2018-09-04 LAB — COMPREHENSIVE METABOLIC PANEL
ALT: 11 U/L (ref 0–44)
AST: 19 U/L (ref 15–41)
Albumin: 2.3 g/dL — ABNORMAL LOW (ref 3.5–5.0)
Alkaline Phosphatase: 50 U/L (ref 38–126)
Anion gap: 8 (ref 5–15)
BUN: 22 mg/dL (ref 8–23)
CO2: 23 mmol/L (ref 22–32)
Calcium: 7.8 mg/dL — ABNORMAL LOW (ref 8.9–10.3)
Chloride: 107 mmol/L (ref 98–111)
Creatinine, Ser: 0.85 mg/dL (ref 0.61–1.24)
GFR calc Af Amer: >60 mL/min (ref >60)
GFR calc non Af Amer: >60 mL/min (ref >60)
Glucose, Bld: 107 mg/dL — ABNORMAL HIGH (ref 70–99)
Potassium: 3.8 mmol/L (ref 3.5–5.1)
Sodium: 138 mmol/L (ref 135–145)
Total Bilirubin: 0.7 mg/dL (ref 0.3–1.2)
Total Protein: 5.4 g/dL — ABNORMAL LOW (ref 6.5–8.1)

## 2018-09-04 LAB — CBC
HCT: 30.5 % — ABNORMAL LOW (ref 39.0–52.0)
Hemoglobin: 10.2 g/dL — ABNORMAL LOW (ref 13.0–17.0)
MCH: 33.1 pg (ref 26.0–34.0)
MCHC: 33.4 g/dL (ref 30.0–36.0)
MCV: 99 fL (ref 80.0–100.0)
Platelets: 201 10*3/uL (ref 150–400)
RBC: 3.08 MIL/uL — ABNORMAL LOW (ref 4.22–5.81)
RDW: 12.6 % (ref 11.5–15.5)
WBC: 8.6 10*3/uL (ref 4.0–10.5)
nRBC: 0 % (ref 0.0–0.2)

## 2018-09-04 LAB — CULTURE, BLOOD (ROUTINE X 2)

## 2018-09-04 LAB — MAGNESIUM: Magnesium: 2.5 mg/dL — ABNORMAL HIGH (ref 1.7–2.4)

## 2018-09-04 NOTE — TOC Initial Note (Signed)
Transition of Care [facility]) - Initial/Assessment Note    Patient Details  Name: Marvin Wagner MRN: [id number] Date of Birth: July 20, 1930  [facility] CM/SW Contact:    Joaquin Courts, RN Phone Number: 09/04/2018, 3:24 PM  Clinical Narrative:  CM spoke with legal guardian over the telephone. Per LG patient is from the [facility] memory care at [facility].  LG is open to the possibility that patient may need a higher level of care such as SNF at dc. Reports patient was getting PT services at memory care and was responding well until he became sick again. Reports that patient had ten hours a day of private duty care at the [facility]. MD notified of LG wishes for PT/OT eval to establish discharge plan.                   Expected Discharge Plan: Memory Care Barriers to Discharge: Continued Medical Work up   Patient Goals and CMS Choice Patient states their goals for this hospitalization and ongoing recovery are:: to go to an appropriate level of care per his legal guardian      Expected Discharge Plan and Services Expected Discharge Plan: Memory Care   Discharge Planning Services: CM Consult   Living arrangements for the past 2 months: [place]                 DME Arranged: N/A DME Agency: NA       HH Arranged: NA HH Agency: NA        Prior Living Arrangements/Services Living arrangements for the past 2 months: [place] Lives with:: Facility Resident Patient language and need for interpreter reviewed:: Yes Do you feel safe going back to the place where you live?: Yes      Need for Family Participation in Patient Care: Yes (Comment)(legal guardian) Care giver support system in place?: Yes (comment)(legal guardian)   Criminal Activity/Legal Involvement Pertinent to Current Situation/Hospitalization: No - Comment as needed  Activities of Daily Living Home Assistive Devices/Equipment: Blood pressure cuff, Eyeglasses, Grab  bars around toilet, Grab bars in shower, Hand-held shower hose, Walker (specify type), Wheelchair(front wheeled walker-[facility] necessary equipment for their residents) ADL Screening (condition at time of admission) Patient's cognitive ability adequate to safely complete daily activities?: No Is the patient deaf or have difficulty hearing?: Yes Does the patient have difficulty seeing, even when wearing glasses/contacts?: No Does the patient have difficulty concentrating, remembering, or making decisions?: Yes Patient able to express need for assistance with ADLs?: No Does the patient have difficulty dressing or bathing?: Yes Independently performs ADLs?: No Communication: Independent Dressing (OT): Dependent Is this a change from baseline?: Change from baseline, expected to last >3 days Grooming: Dependent Is this a change from baseline?: Change from baseline, expected to last >3 days Feeding: Dependent Is this a change from baseline?: Change from baseline, expected to last >3 days Bathing: Dependent Is this a change from baseline?: Change from baseline, expected to last >3 days Toileting: Dependent Is this a change from baseline?: Change from baseline, expected to last >3days In/Out Bed: Dependent Is this a change from baseline?: Change from baseline, expected to last >3 days Walks in Home: Dependent Is this a change from baseline?: Change from baseline, expected to last >3 days Does the patient have difficulty walking or climbing stairs?: Yes(secondary to weakness and shortness of breath) Weakness of Legs: Both Weakness of Arms/Hands: Both  Permission Sought/Granted  Emotional Assessment       Orientation: : Fluctuating Orientation (Suspected and/or reported [place])   Psych Involvement: No (comment)  Admission diagnosis:  Cellulitis of right upper extremity [L03.113] Sepsis, due to unspecified organism, unspecified whether acute organ  dysfunction present [facility]) [A41.9] Patient Active Problem List   Diagnosis Date Noted  . Sepsis ([place]) 09/02/2018  . Cellulitis 09/02/2018  . Community acquired pneumonia 06/19/2018  . Acute encephalopathy 06/19/2018  . CAP (community acquired pneumonia) 06/19/2018  . Acute blood loss anemia 02/27/2016  . UTI (urinary tract infection) 02/27/2016  . Pressure injury of skin 02/25/2016  . Hematoma of chest wall, left, initial encounter 02/24/2016  . Fever 02/24/2016  . Supratherapeutic INR 02/24/2016  . Dementia ([place]) 03/04/2015  . Hypotension 12/03/2014  . Weight loss, unintentional 10/17/2013  . History of elevated PSA 04/25/2013  . Encounter for therapeutic drug monitoring 02/26/2013  . Long term current use of anticoagulant therapy 12/19/2012  . Other vitamin B12 deficiency anemia 02/29/2012  . Memory disorder 02/14/2012  . Hx of CABG 07/01/2010  . Hyperlipidemia 07/01/2010  . Benign hypertensive heart disease without heart failure 07/01/2010   PCP:  Eulas Post, MD Pharmacy:   [place], [place] [address] [place] [address] Phone: [phone] Fax: [phone]  [facility] [address] [place] [facility] [address] AT [address] [place] [address] Phone: [phone] Fax: [place], [place] [address] [place] [address] Phone: [phone] Fax: [phone]  [place] [address], [place] - [address] [place] [place] [address] Phone: [phone] Fax: [phone]     Social Determinants of Health (SDOH) Interventions    Readmission Risk Interventions No flowsheet data found.

## 2018-09-04 NOTE — Progress Notes (Addendum)
PROGRESS NOTE    Marvin Wagner  IRC:[id number] DOB: May 30, 1930 DOA: 09/01/2018 PCP: Eulas Post, MD  Brief Narrative:  Marvin Wagner  is Marvin Wagner 85 y.o. male,  w CAD s/p CABG, h/o Aortic stenosis s/p AVR,  Dementia, apparently presents with fever x2 days,  Pt is unable to give any meaningful history.  Pt apparently fell sometime last week.  Right wrist slightly swollen.   In ED,  T 100.5, P 129 R 23, Bp 144/87  Pox 91% on RA Wt 77.1, kg  R wrist IMPRESSION: 1. No acute osseous abnormality. 2. Mild diffuse soft tissue swelling about the visualized right hand and wrist. 3. Moderate degenerative osteoarthritic changes, most notable at the first [facility] joint.  CXR IMPRESSION: No active disease.  Xray Hip/ pelvis IMPRESSION: 1. No acute osseous abnormality. 2. Mild osteoarthritic changes about the hips bilaterally. 3. Large volume retained stool impacted within the rectal vault, suggesting constipation.  CT brain IMPRESSION: 1. No acute intracranial abnormality. Advanced chronic small vessel ischemic disease and brain atrophy. 2. No evidence for cervical spine fracture. Multi level cervical spondylosis noted.  covid -19 negative  Urinalysis rbc 11-20, wbc 0-5  Na 133, K 4.0, Bun 20, Creatinine 0.86 Ast 20, Alt 13 Wbc 15.2, Hgb 13.8, Plt 184  INR 1.1  Admitted for sepsis 2/2 pneumonia vs cellulitis of R wrist.  Now improved on antibiotics.     Assessment & Plan:   Principal Problem:   Sepsis ([place]) Active Problems:   Hx of CABG   Dementia (HCC)   Cellulitis  Fever   Sepsis   Community Acquired Pneumonia vs Cellulitis of R Wrist Concern for R wrist cellulitis on admission, but CT chest with ground glass opacities concerning for infection.   Triage note mentioned RR 30 and increased WOB as well as R wrist warm to touch and swollen Imaging shows edema, though he did have fall within past week -> traumatic vs cellulitis? He was dx with COVID 92 in May and  has since tested negative COVID 19 testing negative at this admission CT chest with patchy ground glass opacities in both upper lobes and L perihilar region, possibly infectious/inflammatory Will continue vanc/cefepime -> will narrow to ceftriaxone/doxycycline to cover both possible cellulitis and CAP. Follow MRSA PCR (negative) Sputum (pending), urine legionella (pending), urine strep (negative) RVP Blood - 1 of 2 with coag negative staph, suspect contaminant  urine culture with no growth SLP eval -> regular thin liquids Procalcitonin elevated, WBC improving with abx COVID 19 negative x2.  Low suspicion given infection in May.  Discussed discontinuing precautions with ID who agreed.  Tachycardia improved  CAD s/p CABG, AVR Cont Aspirin 81mg  po qday Not taking lipitor per med rec Cont Toprol XL 25mg  po qday  Dementia   Acute Encephalopathy Per med rec, not taking aricept or namenda Cont Risperdal 0.25mg  po BID Per discussion with [name], his communication got worse with COVID infection.  This improved after he recovered, but he's gotten worse recently.  Was speaking in 5 word sentences up until 3 weeks ago (this fluctuated).  While I've been taking care of him, I haven't been able to understand what he's said to me.  Most of his communication with me has been incomprehensible ([name] says this has been the case 2-3 weeks).  Head CT at admission was without acute abnormality.  DVT prophylaxis: lovenox Code Status: DNR Family Communication: none at bedside - called Mrs. Johns 8/2.  No answer 8/3, left  message.  Discussed with 8/4, [name]. Disposition Plan: pending further w/u and improvement   Consultants:   none  Procedures:   none  Antimicrobials:  Anti-infectives (From admission, onward)   Start     Dose/Rate Route Frequency Ordered Stop   09/03/18 2200  doxycycline (VIBRA-TABS) tablet 100 mg     100 mg Oral Every 12 hours 09/03/18 1619     09/03/18 2200   cefTRIAXone (ROCEPHIN) 1 g in sodium chloride 0.9 % 100 mL IVPB     1 g 200 mL/hr over 30 Minutes Intravenous Every 24 hours 09/03/18 1619     09/02/18 1800  vancomycin (VANCOCIN) 1,750 mg in sodium chloride 0.9 % 500 mL IVPB  Status:  Discontinued     1,750 mg 250 mL/hr over 120 Minutes Intravenous Every 24 hours 09/02/18 0347 09/03/18 1619   09/02/18 0600  ceFEPIme (MAXIPIME) 2 g in sodium chloride 0.9 % 100 mL IVPB  Status:  Discontinued     2 g 200 mL/hr over 30 Minutes Intravenous Every 8 hours 09/02/18 0347 09/03/18 1619   09/01/18 2130  ceFEPIme (MAXIPIME) 2 g in sodium chloride 0.9 % 100 mL IVPB     2 g 200 mL/hr over 30 Minutes Intravenous  Once 09/01/18 2117 09/01/18 2306   09/01/18 2130  metroNIDAZOLE (FLAGYL) IVPB 500 mg     500 mg 100 mL/hr over 60 Minutes Intravenous  Once 09/01/18 2117 09/02/18 0047   09/01/18 2130  vancomycin (VANCOCIN) IVPB 1000 mg/200 mL premix  Status:  Discontinued     1,000 mg 200 mL/hr over 60 Minutes Intravenous  Once 09/01/18 2117 09/01/18 2119   09/01/18 2130  vancomycin (VANCOCIN) 1,500 mg in sodium chloride 0.9 % 500 mL IVPB     1,500 mg 250 mL/hr over 120 Minutes Intravenous  Once 09/01/18 2119 09/02/18 0107         Subjective: Unable to communicate with dementia, incomprehensible speech.  Objective: Vitals:   09/03/18 1749 09/03/18 2208 09/04/18 0511 09/04/18 0652  BP:  120/70 130/64   Pulse: 100 86 (!) 104   Resp:  20 18   Temp:  98.7 F (37.1 C) 98.2 F (36.8 C)   TempSrc:  Axillary Oral   SpO2:  97% 96%   Weight:    78.4 kg  Height:        Intake/Output Summary (Last 24 hours) at 09/04/2018 1713 Last data filed at 09/04/2018 1500 Gross per 24 hour  Intake 100 ml  Output --  Net 100 ml   Filed Weights   09/02/18 0600 09/03/18 0624 09/04/18 0652  Weight: 78.4 kg 78.7 kg 78.4 kg    Examination:  General: No acute distress. Cardiovascular: Heart sounds show Lakara Weiland regular rate, and rhythm.  Lungs: Clear to auscultation  bilaterally  Abdomen: Soft, nontender, nondistended  Neurological: Alert and disoriented.  Unable to communicate with dementia. Moves all extremities 4. Cranial nerves II through XII grossly intact. Skin: Warm and dry. No rashes or lesions. Extremities: No clubbing or cyanosis. No edema.   Data Reviewed: I have personally reviewed following labs and imaging studies  CBC: Recent Labs  Lab 09/01/18 2128 09/02/18 0455 09/03/18 0449 09/04/18 0357  WBC 15.2* 12.4* 10.4 8.6  NEUTROABS 11.9*  --   --   --   HGB 13.8 11.1* 10.8* 10.2*  HCT 40.9 33.9* 32.6* 30.5*  MCV 98.6 99.1 98.5 99.0  PLT 184 195 209 867   Basic Metabolic Panel: Recent Labs  Lab 09/01/18  2128 09/02/18 0455 09/03/18 0449 09/04/18 0357  NA 133* 137 138 138  K 4.0 3.6 3.2* 3.8  CL 100 105 106 107  CO2 21* 22 23 23   GLUCOSE 111* 103* 110* 107*  BUN 20 19 17 22   CREATININE 0.86 0.79 0.83 0.85  CALCIUM 8.6* 7.7* 7.8* 7.8*  MG  --   --  2.2 2.5*   GFR: Estimated Creatinine Clearance: 69.7 mL/min (by C-G formula based on SCr of 0.85 mg/dL). Liver Function Tests: Recent Labs  Lab 09/01/18 2128 09/02/18 0455 09/03/18 0449 09/04/18 0357  AST 20 13* 15 19  ALT 13 10 12 11   ALKPHOS 64 50 50 50  BILITOT 1.2 0.9 0.8 0.7  PROT 6.9 5.5* 5.4* 5.4*  ALBUMIN 3.2* 2.6* 2.4* 2.3*   No results for input(s): LIPASE, AMYLASE in the last 168 hours. No results for input(s): AMMONIA in the last 168 hours. Coagulation Profile: Recent Labs  Lab 09/01/18 2128  INR 1.1   Cardiac Enzymes: No results for input(s): CKTOTAL, CKMB, CKMBINDEX, TROPONINI in the last 168 hours. BNP (last 3 results) No results for input(s): PROBNP in the last 8760 hours. HbA1C: No results for input(s): HGBA1C in the last 72 hours. CBG: Recent Labs  Lab 09/01/18 2131  GLUCAP 94   Lipid Profile: No results for input(s): CHOL, HDL, LDLCALC, TRIG, CHOLHDL, LDLDIRECT in the last 72 hours. Thyroid Function Tests: No results for input(s):  TSH, T4TOTAL, FREET4, T3FREE, THYROIDAB in the last 72 hours. Anemia Panel: No results for input(s): VITAMINB12, FOLATE, FERRITIN, TIBC, IRON, RETICCTPCT in the last 72 hours. Sepsis Labs: Recent Labs  Lab 09/01/18 2128 09/02/18 1507 09/03/18 0449 09/04/18 0357  PROCALCITON  --  0.75 0.61 0.46  LATICACIDVEN 1.7  --   --   --     Recent Results (from the past 240 hour(s))  Blood Culture (routine x 2)     Status: Abnormal   Collection Time: 09/01/18  9:29 PM   Specimen: BLOOD  Result Value Ref Range Status   Specimen Description   Final    BLOOD LEFT ANTECUBITAL Performed at [facility] [address] [place], [place] [address]    Special Requests   Final    BOTTLES DRAWN AEROBIC AND ANAEROBIC Blood Culture results may not be optimal due to an excessive volume of blood received in culture bottles Performed at [facility] [address] [place], [place] [address]    Culture  Setup Time   Final    GRAM POSITIVE COCCI IN CLUSTERS AEROBIC BOTTLE ONLY CRITICAL RESULT CALLED TO, READ BACK BY AND VERIFIED WITH: J GRIMSLEY,PHARMD AT 2240 09/02/2018 BY L BENFIELD    Culture (Hina Gupta)  Final    STAPHYLOCOCCUS SPECIES (COAGULASE NEGATIVE) THE SIGNIFICANCE OF ISOLATING THIS ORGANISM FROM Keyasia Jolliff SINGLE SET OF BLOOD CULTURES WHEN MULTIPLE SETS ARE DRAWN IS UNCERTAIN. PLEASE NOTIFY THE MICROBIOLOGY DEPARTMENT WITHIN ONE WEEK IF SPECIATION AND SENSITIVITIES ARE REQUIRED. Performed at [facility], [place] [address], [place], [place] [address]    Report Status 09/04/2018 FINAL  Final  Urine culture     Status: None   Collection Time: 09/01/18  9:29 PM   Specimen: In/Out Cath Urine  Result Value Ref Range Status   Specimen Description   Final    IN/OUT CATH URINE Performed at [facility] [address], [place], [place] [address]    Special Requests   Final    NONE Performed at [facility], [place] [address]  [place], [place] [address]    Culture   Final    NO GROWTH Performed at [facility], [place] [address] [place], [place] [address]    Report Status 09/03/2018 FINAL  Final  Blood Culture ID Panel (Reflexed)     Status: Abnormal   Collection Time: 09/01/18  9:29 PM  Result Value Ref Range Status   Enterococcus species NOT DETECTED NOT DETECTED Final   Listeria monocytogenes NOT DETECTED NOT DETECTED Final   Staphylococcus species DETECTED (Myra Weng) NOT DETECTED Final    Comment: Methicillin (oxacillin) resistant coagulase negative staphylococcus. Possible blood culture contaminant (unless isolated from more than one blood culture draw or clinical case suggests pathogenicity). No antibiotic treatment is indicated for blood  culture contaminants. CRITICAL RESULT CALLED TO, READ BACK BY AND VERIFIED WITH: J GRIMSLEY,PHARMD AT 2040 09/02/2018 BY L BENFIELD    Staphylococcus aureus (BCID) NOT DETECTED NOT DETECTED Final   Methicillin resistance DETECTED (Jahmia Berrett) NOT DETECTED Final    Comment: CRITICAL RESULT CALLED TO, READ BACK BY AND VERIFIED WITH: J GRIMSLEY,PHARMD AT 2040 09/02/2018 BY L BENFIELD    Streptococcus species NOT DETECTED NOT DETECTED Final   Streptococcus agalactiae NOT DETECTED NOT DETECTED Final   Streptococcus pneumoniae NOT DETECTED NOT DETECTED Final   Streptococcus pyogenes NOT DETECTED NOT DETECTED Final   Acinetobacter baumannii NOT DETECTED NOT DETECTED Final   Enterobacteriaceae species NOT DETECTED NOT DETECTED Final   Enterobacter cloacae complex NOT DETECTED NOT DETECTED Final   Escherichia coli NOT DETECTED NOT DETECTED Final   Klebsiella oxytoca NOT DETECTED NOT DETECTED Final   Klebsiella pneumoniae NOT DETECTED NOT DETECTED Final   Proteus species NOT DETECTED NOT DETECTED Final   Serratia marcescens NOT DETECTED NOT DETECTED Final   Haemophilus influenzae NOT DETECTED NOT DETECTED Final   Neisseria meningitidis NOT DETECTED NOT DETECTED Final   Pseudomonas aeruginosa  NOT DETECTED NOT DETECTED Final   Candida albicans NOT DETECTED NOT DETECTED Final   Candida glabrata NOT DETECTED NOT DETECTED Final   Candida krusei NOT DETECTED NOT DETECTED Final   Candida parapsilosis NOT DETECTED NOT DETECTED Final   Candida tropicalis NOT DETECTED NOT DETECTED Final    Comment: Performed at [facility], [place] [address], [place], [place] [address]  Blood Culture (routine x 2)     Status: None (Preliminary result)   Collection Time: 09/01/18  9:33 PM   Specimen: BLOOD RIGHT FOREARM  Result Value Ref Range Status   Specimen Description   Final    BLOOD RIGHT FOREARM Performed at [facility], [place] [address] [place], [place] [address]    Special Requests   Final    BOTTLES DRAWN AEROBIC AND ANAEROBIC Blood Culture results may not be optimal due to an excessive volume of blood received in culture bottles Performed at [facility] [address], [place], [place] [address]    Culture   Final    NO GROWTH 3 DAYS Performed at [facility], [place] [address], [place], [place] [address]    Report Status PENDING  Incomplete  SARS Coronavirus 2 [facility] order, Performed in [facility] hospital lab) Nasopharyngeal Nasopharyngeal Swab     Status: None   Collection Time: 09/01/18  9:33 PM   Specimen: Nasopharyngeal Swab  Result Value Ref Range Status   SARS Coronavirus 2 NEGATIVE NEGATIVE Final    Comment: (NOTE) If result is NEGATIVE SARS-CoV-2 target nucleic acids are NOT  DETECTED. The SARS-CoV-2 RNA is generally detectable in upper and lower  respiratory specimens during the acute phase of infection. The lowest  concentration of SARS-CoV-2 viral copies this assay can detect is 250  copies / mL. Liliann File negative result does not preclude SARS-CoV-2 infection  and should not be used as the sole basis for treatment or other  patient management decisions.  Pearle Wandler negative result may occur with  improper specimen collection / handling, submission  of specimen other  than nasopharyngeal swab, presence of viral mutation(s) within the  areas targeted by this assay, and inadequate number of viral copies  (<250 copies / mL). Shamiyah Ngu negative result must be combined with clinical  observations, patient history, and epidemiological information. If result is POSITIVE SARS-CoV-2 target nucleic acids are DETECTED. The SARS-CoV-2 RNA is generally detectable in upper and lower  respiratory specimens dur ing the acute phase of infection.  Positive  results are indicative of active infection with SARS-CoV-2.  Clinical  correlation with patient history and other diagnostic information is  necessary to determine patient infection status.  Positive results do  not rule out bacterial infection or co-infection with other viruses. If result is PRESUMPTIVE POSTIVE SARS-CoV-2 nucleic acids MAY BE PRESENT.   Dominic Rhome presumptive positive result was obtained on the submitted specimen  and confirmed on repeat testing.  While 2019 novel coronavirus  (SARS-CoV-2) nucleic acids may be present in the submitted sample  additional confirmatory testing may be necessary for epidemiological  and / or clinical management purposes  to differentiate between  SARS-CoV-2 and other Sarbecovirus currently known to infect humans.  If clinically indicated additional testing with an alternate test  methodology [phone]) is advised. The SARS-CoV-2 RNA is generally  detectable in upper and lower respiratory sp ecimens during the acute  phase of infection. The expected result is Negative. Fact Sheet for Patients:  StrictlyIdeas.no Fact Sheet for Healthcare Providers: BankingDealers.co.za This test is not yet approved or cleared by the [place] FDA and has been authorized for detection and/or diagnosis of SARS-CoV-2 by FDA under an Emergency Use Authorization (EUA).  This EUA will remain in effect (meaning this test can be used) for  the duration of the COVID-19 declaration under Section 564(b)(1) of the Act, 21 U.S.C. section 360bbb-3(b)(1), unless the authorization is terminated or revoked sooner. Performed at [facility], [place] [address], [place], [place] [address]   Respiratory Panel by PCR     Status: None   Collection Time: 09/02/18  2:45 PM   Specimen: Nasopharyngeal Swab; Respiratory  Result Value Ref Range Status   Adenovirus NOT DETECTED NOT DETECTED Final   Coronavirus 229E NOT DETECTED NOT DETECTED Final    Comment: (NOTE) The Coronavirus on the Respiratory Panel, DOES NOT test for the novel  Coronavirus (2019 nCoV)    Coronavirus HKU1 NOT DETECTED NOT DETECTED Final   Coronavirus NL63 NOT DETECTED NOT DETECTED Final   Coronavirus OC43 NOT DETECTED NOT DETECTED Final   Metapneumovirus NOT DETECTED NOT DETECTED Final   Rhinovirus / Enterovirus NOT DETECTED NOT DETECTED Final   Influenza Keyston Ardolino NOT DETECTED NOT DETECTED Final   Influenza B NOT DETECTED NOT DETECTED Final   Parainfluenza Virus 1 NOT DETECTED NOT DETECTED Final   Parainfluenza Virus 2 NOT DETECTED NOT DETECTED Final   Parainfluenza Virus 3 NOT DETECTED NOT DETECTED Final   Parainfluenza Virus 4 NOT DETECTED NOT DETECTED Final   Respiratory Syncytial Virus NOT DETECTED NOT DETECTED Final   Bordetella pertussis NOT DETECTED  NOT DETECTED Final   Chlamydophila pneumoniae NOT DETECTED NOT DETECTED Final   Mycoplasma pneumoniae NOT DETECTED NOT DETECTED Final    Comment: Performed at [facility], [place] [address], [place], [place] [address]  MRSA PCR Screening     Status: None   Collection Time: 09/02/18  2:45 PM   Specimen: Nasopharyngeal  Result Value Ref Range Status   MRSA by PCR NEGATIVE NEGATIVE Final    Comment:        The GeneXpert MRSA Assay (FDA approved for NASAL specimens only), is one component of Nikkolas Coomes comprehensive MRSA colonization surveillance program. It is not intended to diagnose MRSA infection nor  to guide or monitor treatment for MRSA infections. Performed at [facility], [place] [address] [place], [place] [address]   SARS Coronavirus 2 [facility] order, Performed in [facility] hospital lab)     Status: None   Collection Time: 09/02/18  6:04 PM  Result Value Ref Range Status   SARS Coronavirus 2 NEGATIVE NEGATIVE Final    Comment: (NOTE) If result is NEGATIVE SARS-CoV-2 target nucleic acids are NOT DETECTED. The SARS-CoV-2 RNA is generally detectable in upper and lower  respiratory specimens during the acute phase of infection. The lowest  concentration of SARS-CoV-2 viral copies this assay can detect is 250  copies / mL. Akina Maish negative result does not preclude SARS-CoV-2 infection  and should not be used as the sole basis for treatment or other  patient management decisions.  Xian Alves negative result may occur with  improper specimen collection / handling, submission of specimen other  than nasopharyngeal swab, presence of viral mutation(s) within the  areas targeted by this assay, and inadequate number of viral copies  (<250 copies / mL). Wilfrido Luedke negative result must be combined with clinical  observations, patient history, and epidemiological information. If result is POSITIVE SARS-CoV-2 target nucleic acids are DETECTED. The SARS-CoV-2 RNA is generally detectable in upper and lower  respiratory specimens dur ing the acute phase of infection.  Positive  results are indicative of active infection with SARS-CoV-2.  Clinical  correlation with patient history and other diagnostic information is  necessary to determine patient infection status.  Positive results do  not rule out bacterial infection or co-infection with other viruses. If result is PRESUMPTIVE POSTIVE SARS-CoV-2 nucleic acids MAY BE PRESENT.   Charlann Wayne presumptive positive result was obtained on the submitted specimen  and confirmed on repeat testing.  While 2019 novel coronavirus  (SARS-CoV-2) nucleic acids may be  present in the submitted sample  additional confirmatory testing may be necessary for epidemiological  and / or clinical management purposes  to differentiate between  SARS-CoV-2 and other Sarbecovirus currently known to infect humans.  If clinically indicated additional testing with an alternate test  methodology [phone]) is advised. The SARS-CoV-2 RNA is generally  detectable in upper and lower respiratory sp ecimens during the acute  phase of infection. The expected result is Negative. Fact Sheet for Patients:  StrictlyIdeas.no Fact Sheet for Healthcare Providers: BankingDealers.co.za This test is not yet approved or cleared by the [place] FDA and has been authorized for detection and/or diagnosis of SARS-CoV-2 by FDA under an Emergency Use Authorization (EUA).  This EUA will remain in effect (meaning this test can be used) for the duration of the COVID-19 declaration under Section 564(b)(1) of the Act, 21 U.S.C. section 360bbb-3(b)(1), unless the authorization is terminated or revoked sooner. Performed at [facility], [place] [address] [place], [place]          Radiology Studies: No results found.      Scheduled Meds:  aspirin EC  81 mg Oral Daily   doxycycline  100 mg Oral Q12H   enoxaparin (LOVENOX) injection  40 mg Subcutaneous Q24H   Melatonin  3 mg Oral QHS   metoprolol succinate  12.5 mg Oral Daily   pantoprazole  40 mg Oral Daily   risperiDONE  0.25 mg Oral BID   Continuous Infusions:  sodium chloride 1,000 mL (09/02/18 1501)   cefTRIAXone (ROCEPHIN)  IV 1 g (09/03/18 2219)     LOS: 2 days    Time spent: over 30 min    Fayrene Helper, MD [facility] Pager AMION  If 7PM-7AM, please contact night-coverage [email or web address] Password [email or web address] 09/04/2018, 5:13 PM

## 2018-09-05 LAB — CBC
HCT: 32.4 % — ABNORMAL LOW (ref 39.0–52.0)
Hemoglobin: 10.8 g/dL — ABNORMAL LOW (ref 13.0–17.0)
MCH: 32.8 pg (ref 26.0–34.0)
MCHC: 33.3 g/dL (ref 30.0–36.0)
MCV: 98.5 fL (ref 80.0–100.0)
Platelets: 235 10*3/uL (ref 150–400)
RBC: 3.29 MIL/uL — ABNORMAL LOW (ref 4.22–5.81)
RDW: 12.3 % (ref 11.5–15.5)
WBC: 9.2 10*3/uL (ref 4.0–10.5)
nRBC: 0 % (ref 0.0–0.2)

## 2018-09-05 LAB — COMPREHENSIVE METABOLIC PANEL
ALT: 15 U/L (ref 0–44)
AST: 26 U/L (ref 15–41)
Albumin: 2.5 g/dL — ABNORMAL LOW (ref 3.5–5.0)
Alkaline Phosphatase: 60 U/L (ref 38–126)
Anion gap: 12 (ref 5–15)
BUN: 25 mg/dL — ABNORMAL HIGH (ref 8–23)
CO2: 22 mmol/L (ref 22–32)
Calcium: 8 mg/dL — ABNORMAL LOW (ref 8.9–10.3)
Chloride: 105 mmol/L (ref 98–111)
Creatinine, Ser: 0.81 mg/dL (ref 0.61–1.24)
GFR calc Af Amer: >60 mL/min (ref >60)
GFR calc non Af Amer: >60 mL/min (ref >60)
Glucose, Bld: 106 mg/dL — ABNORMAL HIGH (ref 70–99)
Potassium: 4.2 mmol/L (ref 3.5–5.1)
Sodium: 139 mmol/L (ref 135–145)
Total Bilirubin: 1.2 mg/dL (ref 0.3–1.2)
Total Protein: 6.1 g/dL — ABNORMAL LOW (ref 6.5–8.1)

## 2018-09-05 LAB — LEGIONELLA PNEUMOPHILA SEROGP 1 UR AG: L. pneumophila Serogp 1 Ur Ag: NEGATIVE

## 2018-09-05 LAB — MAGNESIUM: Magnesium: 2.6 mg/dL — ABNORMAL HIGH (ref 1.7–2.4)

## 2018-09-05 MED ORDER — METOPROLOL SUCCINATE ER 25 MG PO TB24: 25.0000 mg | ORAL_TABLET | Freq: Every day | ORAL | 0 refills | Status: DC

## 2018-09-05 MED ORDER — PANTOPRAZOLE SODIUM 40 MG PO TBEC: 40.0000 mg | DELAYED_RELEASE_TABLET | Freq: Every day | ORAL | 0 refills | Status: AC

## 2018-09-05 MED ORDER — METOPROLOL SUCCINATE ER 25 MG PO TB24
25.0000 mg | ORAL_TABLET | Freq: Every day | ORAL | Status: DC
  Administered 2018-09-06: 25 mg via ORAL
  Filled 2018-09-05: qty 1

## 2018-09-05 MED ORDER — METOPROLOL SUCCINATE ER 25 MG PO TB24
12.5000 mg | ORAL_TABLET | Freq: Once | ORAL | Status: AC
  Administered 2018-09-05: 12.5 mg via ORAL

## 2018-09-05 MED ORDER — DOXYCYCLINE HYCLATE 100 MG PO TABS: 100.0000 mg | ORAL_TABLET | Freq: Two times a day (BID) | ORAL | 0 refills | Status: AC

## 2018-09-05 NOTE — Evaluation (Signed)
Physical Therapy Evaluation Patient Details Name: Marvin Wagner MRN: [id number] DOB: [birth year] Today's Date: 09/05/2018   History of Present Illness  85 year old man admitted for sepsis.  PMH:  dementia, CAD, CABG, dementia and aortic valve replacement  Clinical Impression  Patient was supine in bed at PT arrival. Patient was alert but non-verbal throughout evaluation. Patient admitted from memory care at [facility], and per OT conversation with pt's POA pt was walking with +1 to no assist with RW and participated in ADLs, fed himself and was assisted with showering until ~ 3 weeks ago. He has had a major decline over the lats 3 weeks ago. He is currently limited by functional impairments below (see PT problem list) and currently requires total A for all mobility and is unable to maintain balance independently in seated position. Acute PT will continue to follow and progress mobility as able.     Follow Up Recommendations SNF;LTACH;Supervision/Assistance - 24 hour(memory care vs SNF depending on assistance available at memory care facility)    Equipment Recommendations  None recommended by PT    Recommendations for Other Services       Precautions / Restrictions Precautions Precautions: Fall Precaution Comments: Per OT conversation with pt's guardian pt has had big decline since 3 weeks PTA.  Language issues Restrictions Weight Bearing Restrictions: No      Mobility  Bed Mobility Overal bed mobility: Needs Assistance Bed Mobility: Supine to Sit     Supine to sit: Max assist;HOB elevated     General bed mobility comments: pt unable to follow simple commands for supine to sit transfer, extra time required for visual cues to move supine to sit EOB, pt not assisting; pt unable to maintain seated balance at EOB without max assist from therapist  Transfers                 General transfer comment: NT for safety concerns  Ambulation/Gait                Stairs            Wheelchair Mobility    Modified Rankin (Stroke Patients Only)       Balance Overall balance assessment: Needs assistance Sitting-balance support: Feet unsupported;Bilateral upper extremity supported Sitting balance-Leahy Scale: Zero       Standing balance-Leahy Scale: Zero Standing balance comment: NT due to safety concerns                             Pertinent Vitals/Pain Pain Assessment: Faces Faces Pain Scale: Hurts even more Pain Location: pt unable to state but grimacing and guarding LE's during bed mobility Pain Descriptors / Indicators: Grimacing;Guarding Pain Intervention(s): Limited activity within patient's tolerance;Monitored during session    Home Living Family/patient expects to be discharged to:: (from memory care at [facility])                      Prior Function Level of Independence: Needs assistance   Gait / Transfers Assistance Needed: pt needed assistance form 1 person for mobility and was using RW reluctantly ~ 3 weeks ago prior to major decline  ADL's / Homemaking Assistance Needed: staff assists pt with ADL's such as bathing  Comments: OT spoke to [place].  Pt was feeding himself and getting up and walking with +1 assist vs no assist up until 3 weeks ago. Staff helped him shower. POA reports ~3 weeks  ago, patient had big change in language/function     Hand Dominance        Extremity/Trunk Assessment   Upper Extremity Assessment Upper Extremity Assessment: Defer to OT evaluation RUE Deficits / Details: used R hand with HOH assist for a sip of beverage. Held spoon but pushed it away.  Held arm against gravity but HOH given to avoid spillage LUE Deficits / Details: fisted inside of mitt--unable to remove hand from mitt. PROM wfls    Lower Extremity Assessment Lower Extremity Assessment: Generalized weakness;RLE deficits/detail;LLE deficits/detail RLE Deficits / Details: pt grimacing and guardign LE's wiht  mobility RLE: Unable to fully assess due to pain LLE Deficits / Details: pt grimacing and guardign LE's wiht mobility LLE: Unable to fully assess due to pain    Cervical / Trunk Assessment Cervical / Trunk Assessment: Normal  Communication   Communication: Receptive difficulties;Expressive difficulties  Cognition Arousal/Alertness: Awake/alert Behavior During Therapy: WFL for tasks assessed/performed Overall Cognitive Status: Impaired/Different from baseline Area of Impairment: Following commands                               General Comments: pt unable to follow simple one step commands, pt taking extra time for processing with simple visual cues, pt with history of dementia; worsening speech and understanding per POA.      General Comments      Exercises     Assessment/Plan    PT Assessment Patient needs continued PT services  PT Problem List Decreased strength;Decreased balance;Decreased mobility;Decreased coordination;Decreased activity tolerance;Decreased cognition       PT Treatment Interventions      PT Goals (Current goals can be found in the Care Plan section)  Acute Rehab PT Goals Patient Stated Goal: get back to PLOF PT Goal Formulation: Patient unable to participate in goal setting Time For Goal Achievement: 09/26/2018 Potential to Achieve Goals: Fair    Frequency Min 2X/week   Barriers to discharge        Co-evaluation               AM-PAC PT "6 Clicks" Mobility  Outcome Measure Help needed turning from your back to your side while in a flat bed without using bedrails?: Total Help needed moving from lying on your back to sitting on the side of a flat bed without using bedrails?: Total Help needed moving to and from a bed to a chair (including a wheelchair)?: Total Help needed standing up from a chair using your arms (e.g., wheelchair or bedside chair)?: Total Help needed to walk in hospital room?: Total Help needed climbing 3-5  steps with a railing? : Total 6 Click Score: 6    End of Session   Activity Tolerance: Patient tolerated treatment well Patient left: in bed;with call bell/phone within reach;with restraints reapplied(pt with hand mits in place at PT arrival and reapplied at EOS, bed rails up) Nurse Communication: Mobility status PT Visit Diagnosis: Muscle weakness (generalized) (M62.81);Other abnormalities of gait and mobility (R26.89);Difficulty in walking, not elsewhere classified (R26.2)    Time: 4401-0272 PT Time Calculation (min) (ACUTE ONLY): 24 min   Charges:   PT Evaluation $PT Eval Moderate Complexity: 1 Mod          Kipp Brood, PT, DPT, [facility] Physical Therapist with [facility]  09/05/2018 4:52 PM

## 2018-09-05 NOTE — Plan of Care (Signed)
  Problem: Acute Rehab PT Goals(only PT should resolve) Goal: Pt Will Go Supine/Side To Sit Outcome: Progressing Flowsheets (Taken 09/05/2018 1652) Pt will go Supine/Side to Sit: with minimal assist Goal: Pt Will Go Sit To Supine/Side Outcome: Progressing Flowsheets (Taken 09/05/2018 1652) Pt will go Sit to Supine/Side: with minimal assist Goal: Patient Will Transfer Sit To/From Stand Outcome: Progressing Flowsheets (Taken 09/05/2018 1652) Patient will transfer sit to/from stand: with moderate assist Goal: Pt Will Transfer Bed To Chair/Chair To Bed Outcome: Progressing Flowsheets (Taken 09/05/2018 1652) Pt will Transfer Bed to Chair/Chair to Bed: with mod assist

## 2018-09-05 NOTE — Discharge Instructions (Signed)

## 2018-09-05 NOTE — Progress Notes (Signed)
PROGRESS NOTE  Marvin Wagner YIR:[id number] DOB: 12-11-1930 DOA: 09/01/2018 PCP: Marvin Post, MD  HPI/Recap of past 24 hours: Marvin Wagner y.o.male,w CAD s/p CABG, h/o Aortic stenosis s/p AVR, Dementia, apparently presents with fever x2 days, Pt is unable to give any meaningful history. Pt apparently fell sometime last week. Right wrist slightly swollen.   In ED,  T 100.5, P 129 R 23, Bp 144/87 Pox 91% on RA Wt 77.1, kg  R wrist IMPRESSION: 1. No acute osseous abnormality. 2. Mild diffuse soft tissue swelling about the visualized right hand and wrist. 3. Moderate degenerative osteoarthritic changes, most notable at the first [facility] joint.  CXR IMPRESSION: No active disease.  Xray Hip/ pelvis IMPRESSION: 1. No acute osseous abnormality. 2. Mild osteoarthritic changes about the hips bilaterally. 3. Large volume retained stool impacted within the rectal vault, suggesting constipation.  CT brain IMPRESSION: 1. No acute intracranial abnormality. Advanced chronic small vessel ischemic disease and brain atrophy. 2. No evidence for cervical spine fracture. Multi level cervical spondylosis noted.  covid -19 negative  Urinalysis rbc 11-20, wbc 0-5  Na 133, K 4.0, Bun 20, Creatinine 0.86 Ast 20, Alt 13 Wbc 15.2, Hgb 13.8, Plt 184  INR 1.1  Admitted for sepsis 2/2 pneumonia vs cellulitis of R wrist.  Now improved on antibiotics.     09/05/18: Patient was seen and examined at his bedside.  He is alert but confused in a state of advanced dementia.  Unable to provide reliable history.   Assessment/Plan: Principal Problem:   Sepsis ([place]) Active Problems:   Hx of CABG   Dementia (HCC)   Cellulitis   Sepsis secondary to HCAP versus right wrist cellulitis Presented with leukocytosis and fever Right wrist cellulitis is resolving Sepsis physiology is resolving Afebrile with no leukocytosis O2 saturation 96% on room air Completed 3 days of  Rocephin, switch to oral cefdinir Continue p.o. doxycycline  Sinus tachycardia No anginal symptoms Obtain twelve-lead EKG  Coagulase-negative bacteremia, likely a contaminant Isolated 1 out of 2 bottles  History of COVID-19 infection Repeated COVID-19 testing negative x2 Afebrile in the last 24 hours  Coronary artery disease status Wagner CABG, aortic stenosis status Wagner AVR Continue aspirin 81 mg daily Resume Toprol-XL 25 mg daily  DVT prophylaxis: lovenox subcu daily Code Status: DNR Family Communication:  Will call family to update.  Disposition Plan:  Possible discharge to memory care tomorrow pending clinical improvement.   Consultants:   none  Procedures:   none   Objective: Vitals:   09/04/18 0652 09/04/18 2102 09/05/18 0507 09/05/18 0538  BP:  132/65 (!) 159/80   Pulse:  (!) 101 85   Resp:  18 18   Temp:  98.7 F (37.1 C) 98 F (36.7 C)   TempSrc:  Axillary    SpO2:  96%    Weight: 78.4 kg   77 kg  Height:        Intake/Output Summary (Last 24 hours) at 09/05/2018 1149 Last data filed at 09/05/2018 0900 Gross per 24 hour  Intake 100 ml  Output 200 ml  Net -100 ml   Filed Weights   09/03/18 0624 09/04/18 0652 09/05/18 0538  Weight: 78.7 kg 78.4 kg 77 kg    Exam:   General: 85 y.o. year-old male well developed well nourished in no acute distress.  Alert and confused in the setting of advanced dementia.  Cardiovascular: Regular rate and rhythm with no rubs or gallops.  No thyromegaly or JVD noted.  Respiratory: Clear to auscultation with no wheezes or rales. Good inspiratory effort.  Abdomen: Soft nontender nondistended with normal bowel sounds x4 quadrants.  Musculoskeletal: Trace lower extremity edema. 2/4 pulses in all 4 extremities.  Psychiatry: Mood is appropriate for condition and setting   Data Reviewed: CBC: Recent Labs  Lab 09/01/18 2128 09/02/18 0455 09/03/18 0449 09/04/18 0357 09/05/18 0416  WBC 15.2* 12.4* 10.4 8.6  9.2  NEUTROABS 11.9*  --   --   --   --   HGB 13.8 11.1* 10.8* 10.2* 10.8*  HCT 40.9 33.9* 32.6* 30.5* 32.4*  MCV 98.6 99.1 98.5 99.0 98.5  PLT 184 195 209 201 578   Basic Metabolic Panel: Recent Labs  Lab 09/01/18 2128 09/02/18 0455 09/03/18 0449 09/04/18 0357 09/05/18 0416  NA 133* 137 138 138 139  K 4.0 3.6 3.2* 3.8 4.2  CL 100 105 106 107 105  CO2 21* 22 23 23 22   GLUCOSE 111* 103* 110* 107* 106*  BUN 20 19 17 22  25*  CREATININE 0.86 0.79 0.83 0.85 0.81  CALCIUM 8.6* 7.7* 7.8* 7.8* 8.0*  MG  --   --  2.2 2.5* 2.6*   GFR: Estimated Creatinine Clearance: 72.6 mL/min (by C-G formula based on SCr of 0.81 mg/dL). Liver Function Tests: Recent Labs  Lab 09/01/18 2128 09/02/18 0455 09/03/18 0449 09/04/18 0357 09/05/18 0416  AST 20 13* 15 19 26   ALT 13 10 12 11 15   ALKPHOS 64 50 50 50 60  BILITOT 1.2 0.9 0.8 0.7 1.2  PROT 6.9 5.5* 5.4* 5.4* 6.1*  ALBUMIN 3.2* 2.6* 2.4* 2.3* 2.5*   No results for input(s): LIPASE, AMYLASE in the last 168 hours. No results for input(s): AMMONIA in the last 168 hours. Coagulation Profile: Recent Labs  Lab 09/01/18 2128  INR 1.1   Cardiac Enzymes: No results for input(s): CKTOTAL, CKMB, CKMBINDEX, TROPONINI in the last 168 hours. BNP (last 3 results) No results for input(s): PROBNP in the last 8760 hours. HbA1C: No results for input(s): HGBA1C in the last 72 hours. CBG: Recent Labs  Lab 09/01/18 2131  GLUCAP 94   Lipid Profile: No results for input(s): CHOL, HDL, LDLCALC, TRIG, CHOLHDL, LDLDIRECT in the last 72 hours. Thyroid Function Tests: No results for input(s): TSH, T4TOTAL, FREET4, T3FREE, THYROIDAB in the last 72 hours. Anemia Panel: No results for input(s): VITAMINB12, FOLATE, FERRITIN, TIBC, IRON, RETICCTPCT in the last 72 hours. Urine analysis:    Component Value Date/Time   COLORURINE AMBER (A) 09/01/2018 2129   APPEARANCEUR HAZY (A) 09/01/2018 2129   LABSPEC 1.027 09/01/2018 2129   PHURINE 5.0 09/01/2018  2129   GLUCOSEU NEGATIVE 09/01/2018 2129   HGBUR MODERATE (A) 09/01/2018 2129   BILIRUBINUR NEGATIVE 09/01/2018 2129   BILIRUBINUR neg 02/08/2017 1149   KETONESUR 20 (A) 09/01/2018 2129   PROTEINUR 30 (A) 09/01/2018 2129   UROBILINOGEN 0.2 02/08/2017 1149   UROBILINOGEN 0.2 12/15/2008 1405   NITRITE NEGATIVE 09/01/2018 2129   LEUKOCYTESUR NEGATIVE 09/01/2018 2129   Sepsis Labs: @LABRCNTIP (procalcitonin:4,lacticidven:4)  ) Recent Results (from the past 240 hour(s))  Blood Culture (routine x 2)     Status: Abnormal   Collection Time: 09/01/18  9:29 PM   Specimen: BLOOD  Result Value Ref Range Status   Specimen Description   Final    BLOOD LEFT ANTECUBITAL Performed at [facility], [place] [address], [place], [place] [address]    Special Requests   Final    BOTTLES DRAWN AEROBIC AND ANAEROBIC Blood  Culture results may not be optimal due to an excessive volume of blood received in culture bottles Performed at [place] [address] [place], [place] [address]    Culture  Setup Time   Final    GRAM POSITIVE COCCI IN CLUSTERS AEROBIC BOTTLE ONLY CRITICAL RESULT CALLED TO, READ BACK BY AND VERIFIED WITH: J GRIMSLEY,PHARMD AT 2240 09/02/2018 BY L BENFIELD    Culture (A)  Final    STAPHYLOCOCCUS SPECIES (COAGULASE NEGATIVE) THE SIGNIFICANCE OF ISOLATING THIS ORGANISM FROM A SINGLE SET OF BLOOD CULTURES WHEN MULTIPLE SETS ARE DRAWN IS UNCERTAIN. PLEASE NOTIFY THE MICROBIOLOGY DEPARTMENT WITHIN ONE WEEK IF SPECIATION AND SENSITIVITIES ARE REQUIRED. Performed at [facility], [place] [address], [place], [place] [address]    Report Status 09/04/2018 FINAL  Final  Urine culture     Status: None   Collection Time: 09/01/18  9:29 PM   Specimen: In/Out Cath Urine  Result Value Ref Range Status   Specimen Description   Final    IN/OUT CATH URINE Performed at [place] [address] [place], [place] [address]    Special  Requests   Final    NONE Performed at [facility], [place] [address], [place], [place] [address]    Culture   Final    NO GROWTH Performed at [facility], [place] [address] [place], [place] [address]    Report Status 09/03/2018 FINAL  Final  Blood Culture ID Panel (Reflexed)     Status: Abnormal   Collection Time: 09/01/18  9:29 PM  Result Value Ref Range Status   Enterococcus species NOT DETECTED NOT DETECTED Final   Listeria monocytogenes NOT DETECTED NOT DETECTED Final   Staphylococcus species DETECTED (A) NOT DETECTED Final    Comment: Methicillin (oxacillin) resistant coagulase negative staphylococcus. Possible blood culture contaminant (unless isolated from more than one blood culture draw or clinical case suggests pathogenicity). No antibiotic treatment is indicated for blood  culture contaminants. CRITICAL RESULT CALLED TO, READ BACK BY AND VERIFIED WITH: J GRIMSLEY,PHARMD AT 2040 09/02/2018 BY L BENFIELD    Staphylococcus aureus (BCID) NOT DETECTED NOT DETECTED Final   Methicillin resistance DETECTED (A) NOT DETECTED Final    Comment: CRITICAL RESULT CALLED TO, READ BACK BY AND VERIFIED WITH: J GRIMSLEY,PHARMD AT 2040 09/02/2018 BY L BENFIELD    Streptococcus species NOT DETECTED NOT DETECTED Final   Streptococcus agalactiae NOT DETECTED NOT DETECTED Final   Streptococcus pneumoniae NOT DETECTED NOT DETECTED Final   Streptococcus pyogenes NOT DETECTED NOT DETECTED Final   Acinetobacter baumannii NOT DETECTED NOT DETECTED Final   Enterobacteriaceae species NOT DETECTED NOT DETECTED Final   Enterobacter cloacae complex NOT DETECTED NOT DETECTED Final   Escherichia coli NOT DETECTED NOT DETECTED Final   Klebsiella oxytoca NOT DETECTED NOT DETECTED Final   Klebsiella pneumoniae NOT DETECTED NOT DETECTED Final   Proteus species NOT DETECTED NOT DETECTED Final   Serratia marcescens NOT DETECTED NOT DETECTED Final   Haemophilus influenzae NOT DETECTED NOT  DETECTED Final   Neisseria meningitidis NOT DETECTED NOT DETECTED Final   Pseudomonas aeruginosa NOT DETECTED NOT DETECTED Final   Candida albicans NOT DETECTED NOT DETECTED Final   Candida glabrata NOT DETECTED NOT DETECTED Final   Candida krusei NOT DETECTED NOT DETECTED Final   Candida parapsilosis NOT DETECTED NOT DETECTED Final   Candida tropicalis NOT DETECTED NOT DETECTED Final    Comment: Performed at [facility], [place].  [address], [place], [place] [address]  Blood Culture (routine x 2)     Status: None (Preliminary result)   Collection Time: 09/01/18  9:33 PM   Specimen: BLOOD RIGHT FOREARM  Result Value Ref Range Status   Specimen Description   Final    BLOOD RIGHT FOREARM Performed at [facility], [place] [address], [place], [place] [address]    Special Requests   Final    BOTTLES DRAWN AEROBIC AND ANAEROBIC Blood Culture results may not be optimal due to an excessive volume of blood received in culture bottles Performed at [place] [address], [place], [place] [address]    Culture   Final    NO GROWTH 3 DAYS Performed at [facility], [place] [address], [place], [place] [address]    Report Status PENDING  Incomplete  SARS Coronavirus 2 [facility] order, Performed in [facility] hospital lab) Nasopharyngeal Nasopharyngeal Swab     Status: None   Collection Time: 09/01/18  9:33 PM   Specimen: Nasopharyngeal Swab  Result Value Ref Range Status   SARS Coronavirus 2 NEGATIVE NEGATIVE Final    Comment: (NOTE) If result is NEGATIVE SARS-CoV-2 target nucleic acids are NOT DETECTED. The SARS-CoV-2 RNA is generally detectable in upper and lower  respiratory specimens during the acute phase of infection. The lowest  concentration of SARS-CoV-2 viral copies this assay can detect is 250  copies / mL. A negative result does not preclude SARS-CoV-2 infection  and should not be used as the sole basis for treatment or other  patient management  decisions.  A negative result may occur with  improper specimen collection / handling, submission of specimen other  than nasopharyngeal swab, presence of viral mutation(s) within the  areas targeted by this assay, and inadequate number of viral copies  (<250 copies / mL). A negative result must be combined with clinical  observations, patient history, and epidemiological information. If result is POSITIVE SARS-CoV-2 target nucleic acids are DETECTED. The SARS-CoV-2 RNA is generally detectable in upper and lower  respiratory specimens dur ing the acute phase of infection.  Positive  results are indicative of active infection with SARS-CoV-2.  Clinical  correlation with patient history and other diagnostic information is  necessary to determine patient infection status.  Positive results do  not rule out bacterial infection or co-infection with other viruses. If result is PRESUMPTIVE POSTIVE SARS-CoV-2 nucleic acids MAY BE PRESENT.   A presumptive positive result was obtained on the submitted specimen  and confirmed on repeat testing.  While 2019 novel coronavirus  (SARS-CoV-2) nucleic acids may be present in the submitted sample  additional confirmatory testing may be necessary for epidemiological  and / or clinical management purposes  to differentiate between  SARS-CoV-2 and other Sarbecovirus currently known to infect humans.  If clinically indicated additional testing with an alternate test  methodology [phone]) is advised. The SARS-CoV-2 RNA is generally  detectable in upper and lower respiratory sp ecimens during the acute  phase of infection. The expected result is Negative. Fact Sheet for Patients:  StrictlyIdeas.no Fact Sheet for Healthcare Providers: BankingDealers.co.za This test is not yet approved or cleared by the [place] FDA and has been authorized for detection and/or diagnosis of SARS-CoV-2 by FDA under an  Emergency Use Authorization (EUA).  This EUA will remain in effect (meaning this test can be used) for the duration of the COVID-19 declaration under Section 564(b)(1) of the Act, 21 U.S.C. section 360bbb-3(b)(1), unless  the authorization is terminated or revoked sooner. Performed at [facility], [place] [address], [place], [place] [address]   Respiratory Panel by PCR     Status: None   Collection Time: 09/02/18  2:45 PM   Specimen: Nasopharyngeal Swab; Respiratory  Result Value Ref Range Status   Adenovirus NOT DETECTED NOT DETECTED Final   Coronavirus 229E NOT DETECTED NOT DETECTED Final    Comment: (NOTE) The Coronavirus on the Respiratory Panel, DOES NOT test for the novel  Coronavirus (2019 nCoV)    Coronavirus HKU1 NOT DETECTED NOT DETECTED Final   Coronavirus NL63 NOT DETECTED NOT DETECTED Final   Coronavirus OC43 NOT DETECTED NOT DETECTED Final   Metapneumovirus NOT DETECTED NOT DETECTED Final   Rhinovirus / Enterovirus NOT DETECTED NOT DETECTED Final   Influenza A NOT DETECTED NOT DETECTED Final   Influenza B NOT DETECTED NOT DETECTED Final   Parainfluenza Virus 1 NOT DETECTED NOT DETECTED Final   Parainfluenza Virus 2 NOT DETECTED NOT DETECTED Final   Parainfluenza Virus 3 NOT DETECTED NOT DETECTED Final   Parainfluenza Virus 4 NOT DETECTED NOT DETECTED Final   Respiratory Syncytial Virus NOT DETECTED NOT DETECTED Final   Bordetella pertussis NOT DETECTED NOT DETECTED Final   Chlamydophila pneumoniae NOT DETECTED NOT DETECTED Final   Mycoplasma pneumoniae NOT DETECTED NOT DETECTED Final    Comment: Performed at [facility], [place]. [address] [place], [place] [address]  MRSA PCR Screening     Status: None   Collection Time: 09/02/18  2:45 PM   Specimen: Nasopharyngeal  Result Value Ref Range Status   MRSA by PCR NEGATIVE NEGATIVE Final    Comment:        The GeneXpert MRSA Assay (FDA approved for NASAL specimens only), is one component of  a comprehensive MRSA colonization surveillance program. It is not intended to diagnose MRSA infection nor to guide or monitor treatment for MRSA infections. Performed at [facility], [place] [address], [place], [place] [address]   SARS Coronavirus 2 [facility] order, Performed in [facility] hospital lab)     Status: None   Collection Time: 09/02/18  6:04 PM  Result Value Ref Range Status   SARS Coronavirus 2 NEGATIVE NEGATIVE Final    Comment: (NOTE) If result is NEGATIVE SARS-CoV-2 target nucleic acids are NOT DETECTED. The SARS-CoV-2 RNA is generally detectable in upper and lower  respiratory specimens during the acute phase of infection. The lowest  concentration of SARS-CoV-2 viral copies this assay can detect is 250  copies / mL. A negative result does not preclude SARS-CoV-2 infection  and should not be used as the sole basis for treatment or other  patient management decisions.  A negative result may occur with  improper specimen collection / handling, submission of specimen other  than nasopharyngeal swab, presence of viral mutation(s) within the  areas targeted by this assay, and inadequate number of viral copies  (<250 copies / mL). A negative result must be combined with clinical  observations, patient history, and epidemiological information. If result is POSITIVE SARS-CoV-2 target nucleic acids are DETECTED. The SARS-CoV-2 RNA is generally detectable in upper and lower  respiratory specimens dur ing the acute phase of infection.  Positive  results are indicative of active infection with SARS-CoV-2.  Clinical  correlation with patient history and other diagnostic information is  necessary to determine patient infection status.  Positive results do  not rule out bacterial infection or co-infection with other viruses.  If result is PRESUMPTIVE POSTIVE SARS-CoV-2 nucleic acids MAY BE PRESENT.   A presumptive positive result was obtained on the submitted  specimen  and confirmed on repeat testing.  While 2019 novel coronavirus  (SARS-CoV-2) nucleic acids may be present in the submitted sample  additional confirmatory testing may be necessary for epidemiological  and / or clinical management purposes  to differentiate between  SARS-CoV-2 and other Sarbecovirus currently known to infect humans.  If clinically indicated additional testing with an alternate test  methodology [phone]) is advised. The SARS-CoV-2 RNA is generally  detectable in upper and lower respiratory sp ecimens during the acute  phase of infection. The expected result is Negative. Fact Sheet for Patients:  StrictlyIdeas.no Fact Sheet for Healthcare Providers: BankingDealers.co.za This test is not yet approved or cleared by the [place] FDA and has been authorized for detection and/or diagnosis of SARS-CoV-2 by FDA under an Emergency Use Authorization (EUA).  This EUA will remain in effect (meaning this test can be used) for the duration of the COVID-19 declaration under Section 564(b)(1) of the Act, 21 U.S.C. section 360bbb-3(b)(1), unless the authorization is terminated or revoked sooner. Performed at [facility], [place] [address], [place], [place] [address]       Studies: No results found.  Scheduled Meds:  aspirin EC  81 mg Oral Daily   doxycycline  100 mg Oral Q12H   enoxaparin (LOVENOX) injection  40 mg Subcutaneous Q24H   Melatonin  3 mg Oral QHS   metoprolol succinate  12.5 mg Oral Daily   pantoprazole  40 mg Oral Daily   risperiDONE  0.25 mg Oral BID    Continuous Infusions:  sodium chloride 1,000 mL (09/02/18 1501)   cefTRIAXone (ROCEPHIN)  IV 1 g (09/04/18 2209)     LOS: 3 days     Kayleen Memos, MD [facility] Pager [phone]  If 7PM-7AM, please contact night-coverage [email or web address] Password [email or web address] 09/05/2018, 11:49 AM

## 2018-09-05 NOTE — Evaluation (Signed)
Occupational Therapy Evaluation Patient Details Name: Marvin Wagner MRN: [id number] DOB: 06-12-1930 Today's Date: 09/05/2018    History of Present Illness 85 year old man admitted for sepsis.  PMH:  dementia, CAD, CABG, dementia and aortic valve replacement. Also, had fall and had R wrist swelling   Clinical Impression   Spoke to POA about pt's PLOF. He was at Memory care at [facility], walked with +1 to no assist, participated in ADLs, fed himself and was assisted with showering. He had a decline 3 weeks ago.   Pt currently needs mostly total A for adls at this time. Will follow in acute setting with the goals listed below.     Follow Up Recommendations  (memory care vs snf)    Equipment Recommendations  None recommended by OT    Recommendations for Other Services       Precautions / Restrictions Precautions Precautions: Fall Precaution Comments: Big decline since 3 weeks PTA.  Language issues Restrictions Weight Bearing Restrictions: No      Mobility Bed Mobility               General bed mobility comments: pt did not assist with rolling  Transfers                 General transfer comment: not tested    Balance                                           ADL either performed or assessed with clinical judgement   ADL Overall ADL's : Needs assistance/impaired Eating/Feeding: Maximal assistance   Grooming: Total assistance Grooming Details (indicate cue type and reason): attempted hand over hand to wash face; pt resistance                               General ADL Comments: total A for all ADLs except for self feeding, +2 needed for mobilization beyond EOB     Vision         Perception     Praxis      Pertinent Vitals/Pain Pain Assessment: Faces Faces Pain Scale: Hurts whole lot Pain Location: ? groin/legs Pain Descriptors / Indicators: Grimacing(jumped ) Pain Intervention(s): Limited activity within  patient's tolerance;Monitored during session     Hand Dominance     Extremity/Trunk Assessment Upper Extremity Assessment Upper Extremity Assessment: RUE deficits/detail;LUE deficits/detail RUE Deficits / Details: used R hand with HOH assist for a sip of beverage. Held spoon but pushed it away.  Held arm against gravity but HOH given to avoid spillage LUE Deficits / Details: fisted inside of mitt--unable to remove hand from mitt. PROM wfls           Communication Communication Communication: Receptive difficulties;Expressive difficulties   Cognition Arousal/Alertness: Awake/alert Behavior During Therapy: WFL for tasks assessed/performed Overall Cognitive Status: Impaired/Different from baseline                                 General Comments: pt with history of dementia; worsening speech and understanding per POA.     General Comments       Exercises     Shoulder Instructions      Home Living Family/patient expects to be discharged to:: (from memory [facility] at [facility])                                        Prior Functioning/Environment          Comments: spoke to [place].  Pt was feeding himself and getting up and walking with +1 assist vs no assist up until 3 weeks ago. Staff helped him shower.  3 weeks ago, had big change in language/function        OT Problem List: Decreased strength;Decreased activity tolerance;Decreased cognition;Decreased safety awareness;Pain(balance NT)      OT Treatment/Interventions: Self-care/ADL training;DME and/or AE instruction;Therapeutic activities;Cognitive remediation/compensation;Patient/family education(balance PRN)    OT Goals(Current goals can be found in the care plan section) Acute Rehab OT Goals Patient Stated Goal: get back to PLOF OT Goal Formulation: (POA) Time For Goal Achievement: 09/20/2018 Potential to Achieve Goals: Fair ADL Goals Pt Will Perform Eating: with mod  assist;sitting;bed level Pt Will Perform Grooming: with mod assist;sitting Pt Will Transfer to Toilet: with mod assist;with +2 assist;bedside commode;stand pivot transfer Additional ADL Goal #1: pt will perform bed mobility with mod A in preparation for toileting/ADLs Additional ADL Goal #2: pt will follow basic commands in context 50% of time  OT Frequency: Min 2X/week   Barriers to D/C:            Co-evaluation              AM-PAC OT "6 Clicks" Daily Activity     Outcome Measure Help from another person eating meals?: A Lot Help from another person taking care of personal grooming?: Total Help from another person toileting, which includes using toliet, bedpan, or urinal?: Total Help from another person bathing (including washing, rinsing, drying)?: Total Help from another person to put on and taking off regular upper body clothing?: Total Help from another person to put on and taking off regular lower body clothing?: Total 6 Click Score: 7   End of Session Nurse Communication: (pt appeared to have spasm)  Activity Tolerance: Patient limited by fatigue Patient left: in bed;with call bell/phone within reach;with bed alarm set  OT Visit Diagnosis: Muscle weakness (generalized) (M62.81);Cognitive communication deficit (R41.841)                Time: 1610-9604 OT Time Calculation (min): 12 min Charges:  OT General Charges $OT Visit: 1 Visit OT Evaluation $OT Eval Low Complexity: [place], OTR/L Acute Rehabilitation Services [phone] [facility] pager [phone] office 09/05/2018  [place] 09/05/2018, 3:31 PM

## 2018-09-05 NOTE — Progress Notes (Signed)
  Speech Language Pathology Treatment: Dysphagia  Patient Details Name: Marvin Wagner MRN: [id number] DOB: 1930-08-22 Today's Date: 09/05/2018 Time: 1545-1610 SLP Time Calculation (min) (ACUTE ONLY): 25 min  Assessment / Plan / Recommendation Clinical Impression  Pt seen at bedside for assessment of diet tolerance and education. RN reports no difficulty swallowing, however, pt has had poor po intake today. SLP provided vanilla magic cup, which pt tolerated well. Pt was fed the entire 4oz cup. No obvious oral issues or overt s/s aspiration. Safe swallow precautions at [facility].   HPI HPI: 85 yo male admitted with right arm cellulitis, fevers, recent fall.  PMH + for chronic pons and white matter ischemia,dementia, CAD s/p CABG, h/o Aortic stenosis s/p AVR, COVID + in May 2020. Concern present for constipation and possibly HCAP - ground glass opacities noted.  Swallow evaluation ordered.      SLP Plan  Continue with current plan of care       Recommendations  Diet recommendations: Regular;Thin liquid Liquids provided via: Cup;Straw Medication Administration: Whole meds with puree Supervision: Full supervision/cueing for compensatory strategies;Staff to assist with self feeding Compensations: Minimize environmental distractions;Slow rate;Small sips/bites Postural Changes and/or Swallow Maneuvers: Seated upright 90 degrees;Upright 30-60 min after meal                Oral Care Recommendations: Oral care BID Follow up Recommendations: 24 hour supervision/assistance SLP Visit Diagnosis: Dysphagia, unspecified (R13.10) Plan: Continue with current plan of care       GO              Sareen Randon B. Quentin Ore [facility], CCC-SLP Speech Language Pathologist [phone]  Shonna Chock 09/05/2018, 4:13 PM

## 2018-09-06 LAB — PROCALCITONIN: Procalcitonin: 0.1 ng/mL

## 2018-09-06 LAB — CULTURE, BLOOD (ROUTINE X 2): Culture: NO GROWTH

## 2018-09-06 NOTE — Progress Notes (Signed)
PROGRESS NOTE  Marvin Wagner WPV:[id number] DOB: [birth year] DOA: 09/01/2018 PCP: Marvin Post, MD  HPI/Recap of past 24 hours: Marvin Wagner y.o.male,w CAD s/p CABG, h/o Aortic stenosis s/p AVR, Dementia, apparently presents with fever x2 days, Pt is unable to give any meaningful history. Pt apparently fell sometime last week. Right wrist slightly swollen.   In ED,  T 100.5, P 129 R 23, Bp 144/87 Pox 91% on RA Wt 77.1, kg  R wrist IMPRESSION: 1. No acute osseous abnormality. 2. Mild diffuse soft tissue swelling about the visualized right hand and wrist. 3. Moderate degenerative osteoarthritic changes, most notable at the [facility].  CXR IMPRESSION: No active disease.  Xray Hip/ pelvis IMPRESSION: 1. No acute osseous abnormality. 2. Mild osteoarthritic changes about the hips bilaterally. 3. Large volume retained stool impacted within the rectal vault, suggesting constipation.  CT brain IMPRESSION: 1. No acute intracranial abnormality. Advanced chronic small vessel ischemic disease and brain atrophy. 2. No evidence for cervical spine fracture. Multi level cervical spondylosis noted.  covid -19 negative  Urinalysis rbc 11-20, wbc 0-5  Na 133, K 4.0, Bun 20, Creatinine 0.86 Ast 20, Alt 13 Wbc 15.2, Hgb 13.8, Plt 184  INR 1.1  Admitted for sepsis 2/2 pneumonia vs cellulitis of R wrist.  Now improved on antibiotics.     09/06/18: Patient was seen and examined at his bedside.  Alert but confused in a state of advanced dementia.  Unable to obtain a reliable review of systems due to advanced dementia.   Assessment/Plan: Principal Problem:   Sepsis ([place]) Active Problems:   Hx of CABG   Dementia (HCC)   Cellulitis   Sepsis secondary to HCAP versus right wrist cellulitis Presented with leukocytosis and fever Right wrist cellulitis is resolving Sepsis physiology is resolving Afebrile with no leukocytosis O2 saturation 96% on  room air Day number 4 out of 5 of Rocephin Continue p.o. doxycycline Procalcitonin improved 0.10 on 09/06/2018 from 0.46 on 09/04/18  Intermittent sinus tachycardia No anginal symptoms Continue to monitor on telemetry  Coagulase-negative bacteremia, likely a contaminant Isolated 1 out of 2 bottles  History of COVID-19 infection Repeated COVID-19 testing negative x2 Afebrile in the last 24 hours  Coronary artery disease status Wagner CABG, aortic stenosis status Wagner AVR Continue aspirin 81 mg daily Resume Toprol-XL 25 mg daily  Physical debility PT assessed and recommended SNF CSW consulted for placement  DVT prophylaxis: lovenox subcu daily Code Status: DNR Family Communication:  Will call family to update.  Disposition Plan:  Possible discharge to memory care tomorrow 09/07/2018 pending bed placement.  Consultants:   none  Procedures:   none   Objective: Vitals:   09/05/18 0538 09/05/18 1317 09/05/18 2000 09/06/18 0457  BP:  (!) 141/86 (!) 143/76 135/88  Pulse:  (!) 101 100 100  Resp:  (!) 21 20 20   Temp:  97.9 F (36.6 C) 98.8 F (37.1 C) 98.2 F (36.8 C)  TempSrc:  Oral Oral Oral  SpO2:   97% 97%  Weight: 77 kg   75.8 kg  Height:        Intake/Output Summary (Last 24 hours) at 09/06/2018 1315 Last data filed at 09/06/2018 0600 Gross per 24 hour  Intake 225 ml  Output 300 ml  Net -75 ml   Filed Weights   09/04/18 0652 09/05/18 0538 09/06/18 0457  Weight: 78.4 kg 77 kg 75.8 kg    Exam:  . General: 85 y.o. year-old male well-developed well-nourished no  acute distress.  Alert and confused in a state of advanced dementia.  Cardiovascular: Regular rate and rhythm no rubs or gallops no JVD or thyromegaly noted.   Marland Kitchen Respiratory: Clear to auscultation with no wheezes or rales.  Poor inspiratory effort.   . Abdomen: Soft nontender nondistended normal bowel sounds present.  . Musculoskeletal: 1+ pitting edema in lower extremities bilaterally. \ . Psychiatry:  Mood is appropriate for condition and setting.  Data Reviewed: CBC: Recent Labs  Lab 09/01/18 2128 09/02/18 0455 09/03/18 0449 09/04/18 0357 09/05/18 0416  WBC 15.2* 12.4* 10.4 8.6 9.2  NEUTROABS 11.9*  --   --   --   --   HGB 13.8 11.1* 10.8* 10.2* 10.8*  HCT 40.9 33.9* 32.6* 30.5* 32.4*  MCV 98.6 99.1 98.5 99.0 98.5  PLT 184 195 209 201 193   Basic Metabolic Panel: Recent Labs  Lab 09/01/18 2128 09/02/18 0455 09/03/18 0449 09/04/18 0357 09/05/18 0416  NA 133* 137 138 138 139  K 4.0 3.6 3.2* 3.8 4.2  CL 100 105 106 107 105  CO2 21* 22 23 23 22   GLUCOSE 111* 103* 110* 107* 106*  BUN 20 19 17 22  25*  CREATININE 0.86 0.79 0.83 0.85 0.81  CALCIUM 8.6* 7.7* 7.8* 7.8* 8.0*  MG  --   --  2.2 2.5* 2.6*   GFR: Estimated Creatinine Clearance: 71.5 mL/min (by C-G formula based on SCr of 0.81 mg/dL). Liver Function Tests: Recent Labs  Lab 09/01/18 2128 09/02/18 0455 09/03/18 0449 09/04/18 0357 09/05/18 0416  AST 20 13* 15 19 26   ALT 13 10 12 11 15   ALKPHOS 64 50 50 50 60  BILITOT 1.2 0.9 0.8 0.7 1.2  PROT 6.9 5.5* 5.4* 5.4* 6.1*  ALBUMIN 3.2* 2.6* 2.4* 2.3* 2.5*   No results for input(s): LIPASE, AMYLASE in the last 168 hours. No results for input(s): AMMONIA in the last 168 hours. Coagulation Profile: Recent Labs  Lab 09/01/18 2128  INR 1.1   Cardiac Enzymes: No results for input(s): CKTOTAL, CKMB, CKMBINDEX, TROPONINI in the last 168 hours. BNP (last 3 results) No results for input(s): PROBNP in the last 8760 hours. HbA1C: No results for input(s): HGBA1C in the last 72 hours. CBG: Recent Labs  Lab 09/01/18 2131  GLUCAP 94   Lipid Profile: No results for input(s): CHOL, HDL, LDLCALC, TRIG, CHOLHDL, LDLDIRECT in the last 72 hours. Thyroid Function Tests: No results for input(s): TSH, T4TOTAL, FREET4, T3FREE, THYROIDAB in the last 72 hours. Anemia Panel: No results for input(s): VITAMINB12, FOLATE, FERRITIN, TIBC, IRON, RETICCTPCT in the last 72  hours. Urine analysis:    Component Value Date/Time   COLORURINE AMBER (A) 09/01/2018 2129   APPEARANCEUR HAZY (A) 09/01/2018 2129   LABSPEC 1.027 09/01/2018 2129   PHURINE 5.0 09/01/2018 2129   GLUCOSEU NEGATIVE 09/01/2018 2129   HGBUR MODERATE (A) 09/01/2018 2129   BILIRUBINUR NEGATIVE 09/01/2018 2129   BILIRUBINUR neg 02/08/2017 1149   KETONESUR 20 (A) 09/01/2018 2129   PROTEINUR 30 (A) 09/01/2018 2129   UROBILINOGEN 0.2 02/08/2017 1149   UROBILINOGEN 0.2 12/15/2008 1405   NITRITE NEGATIVE 09/01/2018 2129   LEUKOCYTESUR NEGATIVE 09/01/2018 2129   Sepsis Labs: @LABRCNTIP (procalcitonin:4,lacticidven:4)  ) Recent Results (from the past 240 hour(s))  Blood Culture (routine x 2)     Status: Abnormal   Collection Time: 09/01/18  9:29 PM   Specimen: BLOOD  Result Value Ref Range Status   Specimen Description   Final    BLOOD LEFT ANTECUBITAL Performed  at [facility], [place] [address], [place], [place] [address]    Special Requests   Final    BOTTLES DRAWN AEROBIC AND ANAEROBIC Blood Culture results may not be optimal due to an excessive volume of blood received in culture bottles Performed at [facility] [address], [place], [place] [address]    Culture  Setup Time   Final    GRAM POSITIVE COCCI IN CLUSTERS AEROBIC BOTTLE ONLY CRITICAL RESULT CALLED TO, READ BACK BY AND VERIFIED WITH: J GRIMSLEY,PHARMD AT 2240 09/02/2018 BY L BENFIELD    Culture (A)  Final    STAPHYLOCOCCUS SPECIES (COAGULASE NEGATIVE) THE SIGNIFICANCE OF ISOLATING THIS ORGANISM FROM A SINGLE SET OF BLOOD CULTURES WHEN MULTIPLE SETS ARE DRAWN IS UNCERTAIN. PLEASE NOTIFY THE MICROBIOLOGY DEPARTMENT WITHIN ONE WEEK IF SPECIATION AND SENSITIVITIES ARE REQUIRED. Performed at [facility], [place] [address] [place], [place] [address]    Report Status 09/04/2018 FINAL  Final  Urine culture     Status: None   Collection Time: 09/01/18  9:29 PM   Specimen: In/Out Cath  Urine  Result Value Ref Range Status   Specimen Description   Final    IN/OUT CATH URINE Performed at [facility] [address] [place], [place] [address]    Special Requests   Final    NONE Performed at [facility], [place] [address] [place], [place] [address]    Culture   Final    NO GROWTH Performed at [facility], [place] [address] [place], [place] [address]    Report Status 09/03/2018 FINAL  Final  Blood Culture ID Panel (Reflexed)     Status: Abnormal   Collection Time: 09/01/18  9:29 PM  Result Value Ref Range Status   Enterococcus species NOT DETECTED NOT DETECTED Final   Listeria monocytogenes NOT DETECTED NOT DETECTED Final   Staphylococcus species DETECTED (A) NOT DETECTED Final    Comment: Methicillin (oxacillin) resistant coagulase negative staphylococcus. Possible blood culture contaminant (unless isolated from more than one blood culture draw or clinical case suggests pathogenicity). No antibiotic treatment is indicated for blood  culture contaminants. CRITICAL RESULT CALLED TO, READ BACK BY AND VERIFIED WITH: J GRIMSLEY,PHARMD AT 2040 09/02/2018 BY L BENFIELD    Staphylococcus aureus (BCID) NOT DETECTED NOT DETECTED Final   Methicillin resistance DETECTED (A) NOT DETECTED Final    Comment: CRITICAL RESULT CALLED TO, READ BACK BY AND VERIFIED WITH: J GRIMSLEY,PHARMD AT 2040 09/02/2018 BY L BENFIELD    Streptococcus species NOT DETECTED NOT DETECTED Final   Streptococcus agalactiae NOT DETECTED NOT DETECTED Final   Streptococcus pneumoniae NOT DETECTED NOT DETECTED Final   Streptococcus pyogenes NOT DETECTED NOT DETECTED Final   Acinetobacter baumannii NOT DETECTED NOT DETECTED Final   Enterobacteriaceae species NOT DETECTED NOT DETECTED Final   Enterobacter cloacae complex NOT DETECTED NOT DETECTED Final   Escherichia coli NOT DETECTED NOT DETECTED Final   Klebsiella oxytoca NOT DETECTED NOT DETECTED Final   Klebsiella  pneumoniae NOT DETECTED NOT DETECTED Final   Proteus species NOT DETECTED NOT DETECTED Final   Serratia marcescens NOT DETECTED NOT DETECTED Final   Haemophilus influenzae NOT DETECTED NOT DETECTED Final   Neisseria meningitidis NOT DETECTED NOT DETECTED Final   Pseudomonas aeruginosa NOT DETECTED NOT DETECTED Final   Candida albicans NOT DETECTED NOT DETECTED Final   Candida glabrata NOT DETECTED NOT DETECTED Final   Candida krusei NOT DETECTED NOT DETECTED Final  Candida parapsilosis NOT DETECTED NOT DETECTED Final   Candida tropicalis NOT DETECTED NOT DETECTED Final    Comment: Performed at [facility], [place] [address], [place], [place] [address]  Blood Culture (routine x 2)     Status: None (Preliminary result)   Collection Time: 09/01/18  9:33 PM   Specimen: BLOOD RIGHT FOREARM  Result Value Ref Range Status   Specimen Description   Final    BLOOD RIGHT FOREARM Performed at [facility], [place] [address], [place], [place] [address]    Special Requests   Final    BOTTLES DRAWN AEROBIC AND ANAEROBIC Blood Culture results may not be optimal due to an excessive volume of blood received in culture bottles Performed at [facility] [address], [place], [place] [address]    Culture   Final    NO GROWTH 4 DAYS Performed at [facility], [place] [address], [place], [place] [address]    Report Status PENDING  Incomplete  SARS Coronavirus 2 [facility] order, Performed in [facility] hospital lab) Nasopharyngeal Nasopharyngeal Swab     Status: None   Collection Time: 09/01/18  9:33 PM   Specimen: Nasopharyngeal Swab  Result Value Ref Range Status   SARS Coronavirus 2 NEGATIVE NEGATIVE Final    Comment: (NOTE) If result is NEGATIVE SARS-CoV-2 target nucleic acids are NOT DETECTED. The SARS-CoV-2 RNA is generally detectable in upper and lower  respiratory specimens during the acute phase of infection. The lowest  concentration of SARS-CoV-2 viral  copies this assay can detect is 250  copies / mL. A negative result does not preclude SARS-CoV-2 infection  and should not be used as the sole basis for treatment or other  patient management decisions.  A negative result may occur with  improper specimen collection / handling, submission of specimen other  than nasopharyngeal swab, presence of viral mutation(s) within the  areas targeted by this assay, and inadequate number of viral copies  (<250 copies / mL). A negative result must be combined with clinical  observations, patient history, and epidemiological information. If result is POSITIVE SARS-CoV-2 target nucleic acids are DETECTED. The SARS-CoV-2 RNA is generally detectable in upper and lower  respiratory specimens dur ing the acute phase of infection.  Positive  results are indicative of active infection with SARS-CoV-2.  Clinical  correlation with patient history and other diagnostic information is  necessary to determine patient infection status.  Positive results do  not rule out bacterial infection or co-infection with other viruses. If result is PRESUMPTIVE POSTIVE SARS-CoV-2 nucleic acids MAY BE PRESENT.   A presumptive positive result was obtained on the submitted specimen  and confirmed on repeat testing.  While 2019 novel coronavirus  (SARS-CoV-2) nucleic acids may be present in the submitted sample  additional confirmatory testing may be necessary for epidemiological  and / or clinical management purposes  to differentiate between  SARS-CoV-2 and other Sarbecovirus currently known to infect humans.  If clinically indicated additional testing with an alternate test  methodology [phone]) is advised. The SARS-CoV-2 RNA is generally  detectable in upper and lower respiratory sp ecimens during the acute  phase of infection. The expected result is Negative. Fact Sheet for Patients:  StrictlyIdeas.no Fact Sheet for Healthcare Providers:  BankingDealers.co.za This test is not yet approved or cleared by the [place] FDA and has been authorized for detection and/or diagnosis of SARS-CoV-2 by FDA under an Emergency Use Authorization (EUA).  This EUA  will remain in effect (meaning this test can be used) for the duration of the COVID-19 declaration under Section 564(b)(1) of the Act, 21 U.S.C. section 360bbb-3(b)(1), unless the authorization is terminated or revoked sooner. Performed at [facility], [place] [address], [place], [place] [address]   Respiratory Panel by PCR     Status: None   Collection Time: 09/02/18  2:45 PM   Specimen: Nasopharyngeal Swab; Respiratory  Result Value Ref Range Status   Adenovirus NOT DETECTED NOT DETECTED Final   Coronavirus 229E NOT DETECTED NOT DETECTED Final    Comment: (NOTE) The Coronavirus on the Respiratory Panel, DOES NOT test for the novel  Coronavirus (2019 nCoV)    Coronavirus HKU1 NOT DETECTED NOT DETECTED Final   Coronavirus NL63 NOT DETECTED NOT DETECTED Final   Coronavirus OC43 NOT DETECTED NOT DETECTED Final   Metapneumovirus NOT DETECTED NOT DETECTED Final   Rhinovirus / Enterovirus NOT DETECTED NOT DETECTED Final   Influenza A NOT DETECTED NOT DETECTED Final   Influenza B NOT DETECTED NOT DETECTED Final   Parainfluenza Virus 1 NOT DETECTED NOT DETECTED Final   Parainfluenza Virus 2 NOT DETECTED NOT DETECTED Final   Parainfluenza Virus 3 NOT DETECTED NOT DETECTED Final   Parainfluenza Virus 4 NOT DETECTED NOT DETECTED Final   Respiratory Syncytial Virus NOT DETECTED NOT DETECTED Final   Bordetella pertussis NOT DETECTED NOT DETECTED Final   Chlamydophila pneumoniae NOT DETECTED NOT DETECTED Final   Mycoplasma pneumoniae NOT DETECTED NOT DETECTED Final    Comment: Performed at [facility], [place] [address], [place], [place] [address]  MRSA PCR Screening     Status: None   Collection Time: 09/02/18  2:45 PM   Specimen:  Nasopharyngeal  Result Value Ref Range Status   MRSA by PCR NEGATIVE NEGATIVE Final    Comment:        The GeneXpert MRSA Assay (FDA approved for NASAL specimens only), is one component of a comprehensive MRSA colonization surveillance program. It is not intended to diagnose MRSA infection nor to guide or monitor treatment for MRSA infections. Performed at [facility], [place] [address], [place],  [address]   SARS Coronavirus 2 [facility] order, Performed in [facility] hospital lab)     Status: None   Collection Time: 09/02/18  6:04 PM  Result Value Ref Range Status   SARS Coronavirus 2 NEGATIVE NEGATIVE Final    Comment: (NOTE) If result is NEGATIVE SARS-CoV-2 target nucleic acids are NOT DETECTED. The SARS-CoV-2 RNA is generally detectable in upper and lower  respiratory specimens during the acute phase of infection. The lowest  concentration of SARS-CoV-2 viral copies this assay can detect is 250  copies / mL. A negative result does not preclude SARS-CoV-2 infection  and should not be used as the sole basis for treatment or other  patient management decisions.  A negative result may occur with  improper specimen collection / handling, submission of specimen other  than nasopharyngeal swab, presence of viral mutation(s) within the  areas targeted by this assay, and inadequate number of viral copies  (<250 copies / mL). A negative result must be combined with clinical  observations, patient history, and epidemiological information. If result is POSITIVE SARS-CoV-2 target nucleic acids are DETECTED. The SARS-CoV-2 RNA is generally detectable in upper and lower  respiratory specimens dur ing the acute phase of infection.  Positive  results are indicative of active infection with SARS-CoV-2.  Clinical  correlation with patient  history and other diagnostic information is  necessary to determine patient infection status.  Positive results do  not rule out  bacterial infection or co-infection with other viruses. If result is PRESUMPTIVE POSTIVE SARS-CoV-2 nucleic acids MAY BE PRESENT.   A presumptive positive result was obtained on the submitted specimen  and confirmed on repeat testing.  While 2019 novel coronavirus  (SARS-CoV-2) nucleic acids may be present in the submitted sample  additional confirmatory testing may be necessary for epidemiological  and / or clinical management purposes  to differentiate between  SARS-CoV-2 and other Sarbecovirus currently known to infect humans.  If clinically indicated additional testing with an alternate test  methodology [phone]) is advised. The SARS-CoV-2 RNA is generally  detectable in upper and lower respiratory sp ecimens during the acute  phase of infection. The expected result is Negative. Fact Sheet for Patients:  StrictlyIdeas.no Fact Sheet for Healthcare Providers: BankingDealers.co.za This test is not yet approved or cleared by the [place] FDA and has been authorized for detection and/or diagnosis of SARS-CoV-2 by FDA under an Emergency Use Authorization (EUA).  This EUA will remain in effect (meaning this test can be used) for the duration of the COVID-19 declaration under Section 564(b)(1) of the Act, 21 U.S.C. section 360bbb-3(b)(1), unless the authorization is terminated or revoked sooner. Performed at [facility], [place] [address], [place],  [address]       Studies: No results found.  Scheduled Meds: . aspirin EC  81 mg Oral Daily  . doxycycline  100 mg Oral Q12H  . enoxaparin (LOVENOX) injection  40 mg Subcutaneous Q24H  . Melatonin  3 mg Oral QHS  . metoprolol succinate  25 mg Oral Daily  . pantoprazole  40 mg Oral Daily  . risperiDONE  0.25 mg Oral BID    Continuous Infusions: . sodium chloride 1,000 mL (09/02/18 1501)  . cefTRIAXone (ROCEPHIN)  IV Stopped (09/06/18 0000)     LOS: 4  days     Kayleen Memos, MD [facility] Pager [phone]  If 7PM-7AM, please contact night-coverage [email or web address] Password [email or web address] 09/06/2018, 1:15 PM

## 2018-09-06 NOTE — Progress Notes (Signed)
Physical Therapy Treatment Patient Details Name: Marvin Wagner MRN: [id number] DOB: November 06, 1930 Today's Date: 09/06/2018    History of Present Illness 85 year old man admitted for sepsis.  PMH:  dementia, CAD, CABG, dementia and aortic valve replacement    PT Comments    Patient remains greatly limited by confusion and difficulty following directions. He continued to require max +2/total assist for supine<>sit tranfers. He was able to maintain his balance with bil UE support on bed and no assistance this date. Attempted to stand with patient 3x however pt resisting transfer each time and discontinued attempts for stand. He will continue to benefit from skilled PT to progress mobility as able and from continued care at memory care facility or SNF depending on assistance and supervision available. Acute will follow.   Follow Up Recommendations  SNF;Supervision/Assistance - 24 hour(memory care vs SNF depending on assistance available at memory care facility)     Equipment Recommendations  None recommended by PT    Recommendations for Other Services       Precautions / Restrictions Precautions Precautions: Fall Restrictions Weight Bearing Restrictions: No    Mobility  Bed Mobility Overal bed mobility: Needs Assistance Bed Mobility: Supine to Sit     Supine to sit: Max assist;HOB elevated;+2 for physical assistance;+2 for safety/equipment     General bed mobility comments: pt continues to be unable to follow commands consistently during bed mobility, followed simple command with verbal/tactile cue to scoot Bil LE's towards EOB, max assist need to pivot pt to sit EOB.  Transfers Overall transfer level: Needs assistance Equipment used: 2 person hand held assist Transfers: Sit to/from Stand Sit to Stand: +2 physical assistance;+2 safety/equipment;Total assist;From elevated surface         General transfer comment: attempted sit<>stand 3x however patient resistant to stand and  discontinued for safety concerns  Ambulation/Gait                 Stairs             Wheelchair Mobility    Modified Rankin (Stroke Patients Only)       Balance Overall balance assessment: Needs assistance Sitting-balance support: Feet unsupported;Bilateral upper extremity supported Sitting balance-Leahy Scale: Poor Sitting balance - Comments: pt able to support himself at EOB with feet on floor and bil UE support     Standing balance-Leahy Scale: Zero Standing balance comment: attempted sit<>stand, pt unable to ge to standing                 Cognition Arousal/Alertness: Awake/alert Behavior During Therapy: WFL for tasks assessed/performed Overall Cognitive Status: Impaired/Different from baseline Area of Impairment: Following commands              General Comments: pt unable to follow simple one step commands, pt taking extra time for processing with simple visual cues, pt with history of dementia; worsening speech and understanding per POA.             Pertinent Vitals/Pain Pain Assessment: Faces Faces Pain Scale: Hurts little more Pain Location: pt unable to state but grimacing and guarding LE's during bed mobility Pain Descriptors / Indicators: Grimacing;Guarding Pain Intervention(s): Limited activity within patient's tolerance;Monitored during session;Repositioned           PT Goals (current goals can now be found in the care plan section) Acute Rehab PT Goals Patient Stated Goal: get back to PLOF PT Goal Formulation: Patient unable to participate in goal setting Time For Goal Achievement:  09/09/2018 Potential to Achieve Goals: Fair Progress towards PT goals: Progressing toward goals    Frequency    Min 2X/week      PT Plan Current plan remains appropriate       AM-PAC PT "6 Clicks" Mobility   Outcome Measure  Help needed turning from your back to your side while in a flat bed without using bedrails?: Total Help needed  moving from lying on your back to sitting on the side of a flat bed without using bedrails?: Total Help needed moving to and from a bed to a chair (including a wheelchair)?: Total Help needed standing up from a chair using your arms (e.g., wheelchair or bedside chair)?: Total Help needed to walk in hospital room?: Total Help needed climbing 3-5 steps with a railing? : Total 6 Click Score: 6    End of Session Equipment Utilized During Treatment: Gait belt Activity Tolerance: Patient tolerated treatment well Patient left: in bed;with call bell/phone within reach;with restraints reapplied;with bed alarm set(pt with hand mits in place at PT arrival and reapplied at EOS, bed rails up) Nurse Communication: Mobility status PT Visit Diagnosis: Muscle weakness (generalized) (M62.81);Other abnormalities of gait and mobility (R26.89);Difficulty in walking, not elsewhere classified (R26.2)     Time: 2297-9892 PT Time Calculation (min) (ACUTE ONLY): 36 min  Charges:  $Therapeutic Activity: 23-37 mins                     Kipp Brood, PT, DPT, [facility] Physical Therapist with [facility]  09/06/2018 4:52 PM

## 2018-09-06 NOTE — Care Management Important Message (Signed)
Important Message  Patient Details IM Letter given to Cookie McGibboney RN to present to the Patient Name: Marvin Wagner MRN: [id number] Date of Birth: May 21, 1930   Medicare Important Message Given:  Yes     Kerin Salen 09/06/2018, 10:22 AM

## 2018-09-07 LAB — BASIC METABOLIC PANEL
Anion gap: 9 (ref 5–15)
BUN: 24 mg/dL — ABNORMAL HIGH (ref 8–23)
CO2: 25 mmol/L (ref 22–32)
Calcium: 8.4 mg/dL — ABNORMAL LOW (ref 8.9–10.3)
Chloride: 107 mmol/L (ref 98–111)
Creatinine, Ser: 0.78 mg/dL (ref 0.61–1.24)
GFR calc Af Amer: >60 mL/min (ref >60)
GFR calc non Af Amer: >60 mL/min (ref >60)
Glucose, Bld: 117 mg/dL — ABNORMAL HIGH (ref 70–99)
Potassium: 3.6 mmol/L (ref 3.5–5.1)
Sodium: 141 mmol/L (ref 135–145)

## 2018-09-07 MED ORDER — METOPROLOL SUCCINATE ER 25 MG PO TB24: 37.5000 mg | ORAL_TABLET | Freq: Every day | ORAL | Status: DC

## 2018-09-07 MED ORDER — METOPROLOL SUCCINATE ER 25 MG PO TB24
37.5000 mg | ORAL_TABLET | Freq: Every day | ORAL | Status: DC
  Administered 2018-09-07 – 2018-09-10 (×3): 37.5 mg via ORAL
  Filled 2018-09-07 (×4): qty 2

## 2018-09-07 MED ORDER — METOPROLOL SUCCINATE ER 25 MG PO TB24: 25.0000 mg | ORAL_TABLET | Freq: Every day | ORAL | Status: DC

## 2018-09-07 NOTE — Plan of Care (Signed)
  Problem: Health Behavior/Discharge Planning: Goal: Ability to manage health-related needs will improve Outcome: Progressing   Problem: Clinical Measurements: Goal: Ability to maintain clinical measurements within normal limits will improve Outcome: Progressing Goal: Diagnostic test results will improve Outcome: Progressing Goal: Cardiovascular complication will be avoided Outcome: Progressing

## 2018-09-07 NOTE — Progress Notes (Signed)
PROGRESS NOTE  Marvin Wagner CNO:[id number] DOB: 07/16/1930 DOA: 09/01/2018 PCP: Marvin Post, MD  HPI/Recap of past 24 hours: Marvin Wagner y.o.male,w CAD s/p CABG, h/o Aortic stenosis s/p AVR, Dementia, apparently presents with fever x2 days, Pt is unable to give any meaningful history. Pt apparently fell sometime last week. Right wrist slightly swollen.   In ED,  T 100.5, P 129 R 23, Bp 144/87 Pox 91% on RA Wt 77.1, kg  R wrist IMPRESSION: 1. No acute osseous abnormality. 2. Mild diffuse soft tissue swelling about the visualized right hand and wrist. 3. Moderate degenerative osteoarthritic changes, most notable at the first [facility] joint.  CXR IMPRESSION: No active disease.  Xray Hip/ pelvis IMPRESSION: 1. No acute osseous abnormality. 2. Mild osteoarthritic changes about the hips bilaterally. 3. Large volume retained stool impacted within the rectal vault, suggesting constipation.  CT brain IMPRESSION: 1. No acute intracranial abnormality. Advanced chronic small vessel ischemic disease and brain atrophy. 2. No evidence for cervical spine fracture. Multi level cervical spondylosis noted.  covid -19 negative  Urinalysis rbc 11-20, wbc 0-5  Na 133, K 4.0, Bun 20, Creatinine 0.86 Ast 20, Alt 13 Wbc 15.2, Hgb 13.8, Plt 184  INR 1.1  Admitted for sepsis 2/2 pneumonia vs cellulitis of R wrist.  Now improved on antibiotics.     09/07/18: Patient was seen and examined at his bedside this morning.  A. fib with RVR on telemetry confirmed with twelve-lead EKG.  Also hypertensive, will increase his dose of Marvin [place] from 25 mg to 50 mg daily and closely monitor his vital signs.  He is asymptomatic.  He denies chest pain, palpitations or dyspnea.  Difficult to obtain reliable review of systems due to advanced dementia.    Assessment/Plan: Principal Problem:   Sepsis ([place]) Active Problems:   Hx of CABG   Dementia (HCC)   Cellulitis    Resolving sepsis secondary to HCAP versus right wrist cellulitis Presented with leukocytosis and fever Right wrist cellulitis is resolving Sepsis physiology is resolving Afebrile with no leukocytosis O2 saturation 96% on room air Day 5 out of 5 of Rocephin and p.o. doxycycline Procalcitonin improved 0.10 on 09/06/2018 from 0.46 on 09/04/18  Paroxysmal A. fib with RVR Asymptomatic, denies anginal symptoms or palpitations History of a flutter and paroxysmal A. fib previously on warfarin Per review of record anticoagulation stopped due to poor candidate On Marvin Wagner 25 mg daily, will increase the dose to 37.5 mg daily Continue to closely monitor on telemetry Per record last seen by cardiology in 2017 then managed by his primary care provider.  Prolonged QTC Personally reviewed twelve-lead EKG done this morning which showed QTC 518 Avoid QTC prolonging agents Repeat twelve-lead EKG in the morning  Coagulase-negative bacteremia, likely a contaminant Isolated 1 out of 2 bottles  History of COVID-19 infection Repeated COVID-19 testing negative x2 Afebrile in the last 24 hours O2 sat 96% on room air  Coronary artery disease status Wagner CABG, aortic stenosis status Wagner AVR Continue aspirin 81 mg daily Resume Marvin Wagner 25 mg daily  Physical debility PT assessed and recommended SNF CSW consulted for placement  DVT prophylaxis: lovenox subcu daily Code Status: DNR Family Communication:  Will call family to update.  Disposition Plan:  Possible discharge to memory care tomorrow 09/08/2018 once heart rate is better controlled.  Consultants:   none  Procedures:   none   Objective: Vitals:   09/06/18 0457 09/06/18 1406 09/06/18 2047 09/07/18 0530  BP: 135/88 (!) 150/72 Marland Kitchen)  159/80 (!) 162/70  Pulse: 100 (!) 103 (!) 105 (!) 101  Resp: 20 15 16 16   Temp: 98.2 F (36.8 C) 99.2 F (37.3 C) 99 F (37.2 C) 98.4 F (36.9 C)  TempSrc: Oral Axillary Oral Oral  SpO2: 97% 100% 94%  96%  Weight: 75.8 kg   75.2 kg  Height:        Intake/Output Summary (Last 24 hours) at 09/07/2018 0915 Last data filed at 09/07/2018 0600 Gross per 24 hour  Intake 280.47 ml  Output 125 ml  Net 155.47 ml   Filed Weights   09/05/18 0538 09/06/18 0457 09/07/18 0530  Weight: 77 kg 75.8 kg 75.2 kg    Exam:  . General: 85 y.o. year-old male well-developed well-nourished no acute distress.  Alert and confused in the setting of advanced dementia.  . Cardiovascular: Irregular rate and rhythm no rubs or gallops no JVD or thyromegaly.   Marland Kitchen Respiratory: Clear to auscultation no wheezes or rales.  Poor inspiratory effort.   . Abdomen: Soft nontender nondistended normal bowel sounds present. . Musculoskeletal: 1+ pitting edema from ankle down bilaterally.  2 out of 4 pulses in all 4 extremities. Marland Kitchen Psychiatry: Mood is appropriate for condition and setting.  Data Reviewed: CBC: Recent Labs  Lab 09/01/18 2128 09/02/18 0455 09/03/18 0449 09/04/18 0357 09/05/18 0416  WBC 15.2* 12.4* 10.4 8.6 9.2  NEUTROABS 11.9*  --   --   --   --   HGB 13.8 11.1* 10.8* 10.2* 10.8*  HCT 40.9 33.9* 32.6* 30.5* 32.4*  MCV 98.6 99.1 98.5 99.0 98.5  PLT 184 195 209 201 025   Basic Metabolic Panel: Recent Labs  Lab 09/02/18 0455 09/03/18 0449 09/04/18 0357 09/05/18 0416 09/07/18 0611  NA 137 138 138 139 141  K 3.6 3.2* 3.8 4.2 3.6  CL 105 106 107 105 107  CO2 22 23 23 22 25   GLUCOSE 103* 110* 107* 106* 117*  BUN 19 17 22  25* 24*  CREATININE 0.79 0.83 0.85 0.81 0.78  CALCIUM 7.7* 7.8* 7.8* 8.0* 8.4*  MG  --  2.2 2.5* 2.6*  --    GFR: Estimated Creatinine Clearance: 71.8 mL/min (by C-G formula based on SCr of 0.78 mg/dL). Liver Function Tests: Recent Labs  Lab 09/01/18 2128 09/02/18 0455 09/03/18 0449 09/04/18 0357 09/05/18 0416  AST 20 13* 15 19 26   ALT 13 10 12 11 15   ALKPHOS 64 50 50 50 60  BILITOT 1.2 0.9 0.8 0.7 1.2  PROT 6.9 5.5* 5.4* 5.4* 6.1*  ALBUMIN 3.2* 2.6* 2.4* 2.3* 2.5*    No results for input(s): LIPASE, AMYLASE in the last 168 hours. No results for input(s): AMMONIA in the last 168 hours. Coagulation Profile: Recent Labs  Lab 09/01/18 2128  INR 1.1   Cardiac Enzymes: No results for input(s): CKTOTAL, CKMB, CKMBINDEX, TROPONINI in the last 168 hours. BNP (last 3 results) No results for input(s): PROBNP in the last 8760 hours. HbA1C: No results for input(s): HGBA1C in the last 72 hours. CBG: Recent Labs  Lab 09/01/18 2131  GLUCAP 94   Lipid Profile: No results for input(s): CHOL, HDL, LDLCALC, TRIG, CHOLHDL, LDLDIRECT in the last 72 hours. Thyroid Function Tests: No results for input(s): TSH, T4TOTAL, FREET4, T3FREE, THYROIDAB in the last 72 hours. Anemia Panel: No results for input(s): VITAMINB12, FOLATE, FERRITIN, TIBC, IRON, RETICCTPCT in the last 72 hours. Urine analysis:    Component Value Date/Time   COLORURINE AMBER (A) 09/01/2018 2129   APPEARANCEUR HAZY (  A) 09/01/2018 2129   LABSPEC 1.027 09/01/2018 2129   PHURINE 5.0 09/01/2018 2129   GLUCOSEU NEGATIVE 09/01/2018 2129   HGBUR MODERATE (A) 09/01/2018 2129   BILIRUBINUR NEGATIVE 09/01/2018 2129   BILIRUBINUR neg 02/08/2017 1149   KETONESUR 20 (A) 09/01/2018 2129   PROTEINUR 30 (A) 09/01/2018 2129   UROBILINOGEN 0.2 02/08/2017 1149   UROBILINOGEN 0.2 12/15/2008 1405   NITRITE NEGATIVE 09/01/2018 2129   LEUKOCYTESUR NEGATIVE 09/01/2018 2129   Sepsis Labs: @LABRCNTIP (procalcitonin:4,lacticidven:4)  ) Recent Results (from the past 240 hour(s))  Blood Culture (routine x 2)     Status: Abnormal   Collection Time: 09/01/18  9:29 PM   Specimen: BLOOD  Result Value Ref Range Status   Specimen Description   Final    BLOOD LEFT ANTECUBITAL Performed at [facility], [place] [address], [place], [place] [address]    Special Requests   Final    BOTTLES DRAWN AEROBIC AND ANAEROBIC Blood Culture results may not be optimal due to an excessive volume of blood received in  culture bottles Performed at [place] [address], [place], [place] [address]    Culture  Setup Time   Final    GRAM POSITIVE COCCI IN CLUSTERS AEROBIC BOTTLE ONLY CRITICAL RESULT CALLED TO, READ BACK BY AND VERIFIED WITH: J GRIMSLEY,PHARMD AT 2240 09/02/2018 BY L BENFIELD    Culture (A)  Final    STAPHYLOCOCCUS SPECIES (COAGULASE NEGATIVE) THE SIGNIFICANCE OF ISOLATING THIS ORGANISM FROM A SINGLE SET OF BLOOD CULTURES WHEN MULTIPLE SETS ARE DRAWN IS UNCERTAIN. PLEASE NOTIFY THE MICROBIOLOGY DEPARTMENT WITHIN ONE WEEK IF SPECIATION AND SENSITIVITIES ARE REQUIRED. Performed at [facility], [place] [address] [place], [place] [address]    Report Status 09/04/2018 FINAL  Final  Urine culture     Status: None   Collection Time: 09/01/18  9:29 PM   Specimen: In/Out Cath Urine  Result Value Ref Range Status   Specimen Description   Final    IN/OUT CATH URINE Performed at [place] [address] [place], [place] [address]    Special Requests   Final    NONE Performed at [facility], [place] [address], [place], [place] [address]    Culture   Final    NO GROWTH Performed at [facility], [place] [address], [place], [place] [address]    Report Status 09/03/2018 FINAL  Final  Blood Culture ID Panel (Reflexed)     Status: Abnormal   Collection Time: 09/01/18  9:29 PM  Result Value Ref Range Status   Enterococcus species NOT DETECTED NOT DETECTED Final   Listeria monocytogenes NOT DETECTED NOT DETECTED Final   Staphylococcus species DETECTED (A) NOT DETECTED Final    Comment: Methicillin (oxacillin) resistant coagulase negative staphylococcus. Possible blood culture contaminant (unless isolated from more than one blood culture draw or clinical case suggests pathogenicity). No antibiotic treatment is indicated for blood  culture contaminants. CRITICAL RESULT CALLED TO, READ BACK BY AND VERIFIED WITH: J GRIMSLEY,PHARMD  AT 2040 09/02/2018 BY L BENFIELD    Staphylococcus aureus (BCID) NOT DETECTED NOT DETECTED Final   Methicillin resistance DETECTED (A) NOT DETECTED Final    Comment: CRITICAL RESULT CALLED TO, READ BACK BY AND VERIFIED WITH: J GRIMSLEY,PHARMD AT 2040 09/02/2018 BY L BENFIELD    Streptococcus species NOT DETECTED NOT DETECTED Final   Streptococcus agalactiae NOT DETECTED NOT DETECTED Final   Streptococcus pneumoniae NOT DETECTED NOT DETECTED Final  Streptococcus pyogenes NOT DETECTED NOT DETECTED Final   Acinetobacter baumannii NOT DETECTED NOT DETECTED Final   Enterobacteriaceae species NOT DETECTED NOT DETECTED Final   Enterobacter cloacae complex NOT DETECTED NOT DETECTED Final   Escherichia coli NOT DETECTED NOT DETECTED Final   Klebsiella oxytoca NOT DETECTED NOT DETECTED Final   Klebsiella pneumoniae NOT DETECTED NOT DETECTED Final   Proteus species NOT DETECTED NOT DETECTED Final   Serratia marcescens NOT DETECTED NOT DETECTED Final   Haemophilus influenzae NOT DETECTED NOT DETECTED Final   Neisseria meningitidis NOT DETECTED NOT DETECTED Final   Pseudomonas aeruginosa NOT DETECTED NOT DETECTED Final   Candida albicans NOT DETECTED NOT DETECTED Final   Candida glabrata NOT DETECTED NOT DETECTED Final   Candida krusei NOT DETECTED NOT DETECTED Final   Candida parapsilosis NOT DETECTED NOT DETECTED Final   Candida tropicalis NOT DETECTED NOT DETECTED Final    Comment: Performed at [facility], [place] [address] [place], [place] [address]  Blood Culture (routine x 2)     Status: None   Collection Time: 09/01/18  9:33 PM   Specimen: BLOOD RIGHT FOREARM  Result Value Ref Range Status   Specimen Description   Final    BLOOD RIGHT FOREARM Performed at [facility], [place] [address] [place], [place] [address]    Special Requests   Final    BOTTLES DRAWN AEROBIC AND ANAEROBIC Blood Culture results may not be optimal due to an excessive volume of blood received in culture  bottles Performed at [place] [address] [place], [place] [address]    Culture   Final    NO GROWTH 5 DAYS Performed at [facility], [place] [address] [place], [place] [address]    Report Status 09/06/2018 FINAL  Final  SARS Coronavirus 2 [facility] order, Performed in [facility] hospital lab) Nasopharyngeal Nasopharyngeal Swab     Status: None   Collection Time: 09/01/18  9:33 PM   Specimen: Nasopharyngeal Swab  Result Value Ref Range Status   SARS Coronavirus 2 NEGATIVE NEGATIVE Final    Comment: (NOTE) If result is NEGATIVE SARS-CoV-2 target nucleic acids are NOT DETECTED. The SARS-CoV-2 RNA is generally detectable in upper and lower  respiratory specimens during the acute phase of infection. The lowest  concentration of SARS-CoV-2 viral copies this assay can detect is 250  copies / mL. A negative result does not preclude SARS-CoV-2 infection  and should not be used as the sole basis for treatment or other  patient management decisions.  A negative result may occur with  improper specimen collection / handling, submission of specimen other  than nasopharyngeal swab, presence of viral mutation(s) within the  areas targeted by this assay, and inadequate number of viral copies  (<250 copies / mL). A negative result must be combined with clinical  observations, patient history, and epidemiological information. If result is POSITIVE SARS-CoV-2 target nucleic acids are DETECTED. The SARS-CoV-2 RNA is generally detectable in upper and lower  respiratory specimens dur ing the acute phase of infection.  Positive  results are indicative of active infection with SARS-CoV-2.  Clinical  correlation with patient history and other diagnostic information is  necessary to determine patient infection status.  Positive results do  not rule out bacterial infection or co-infection with other viruses. If result is PRESUMPTIVE POSTIVE SARS-CoV-2 nucleic acids MAY BE  PRESENT.   A presumptive positive result was obtained on the submitted specimen  and confirmed on  repeat testing.  While 2019 novel coronavirus  (SARS-CoV-2) nucleic acids may be present in the submitted sample  additional confirmatory testing may be necessary for epidemiological  and / or clinical management purposes  to differentiate between  SARS-CoV-2 and other Sarbecovirus currently known to infect humans.  If clinically indicated additional testing with an alternate test  methodology [id number]) is advised. The SARS-CoV-2 RNA is generally  detectable in upper and lower respiratory sp ecimens during the acute  phase of infection. The expected result is Negative. Fact Sheet for Patients:  StrictlyIdeas.no Fact Sheet for Healthcare Providers: BankingDealers.co.za This test is not yet approved or cleared by the [place] FDA and has been authorized for detection and/or diagnosis of SARS-CoV-2 by FDA under an Emergency Use Authorization (EUA).  This EUA will remain in effect (meaning this test can be used) for the duration of the COVID-19 declaration under Section 564(b)(1) of the Act, 21 U.S.C. section 360bbb-3(b)(1), unless the authorization is terminated or revoked sooner. Performed at [facility], [place] [address], [place], [place] [address]   Respiratory Panel by PCR     Status: None   Collection Time: 09/02/18  2:45 PM   Specimen: Nasopharyngeal Swab; Respiratory  Result Value Ref Range Status   Adenovirus NOT DETECTED NOT DETECTED Final   Coronavirus 229E NOT DETECTED NOT DETECTED Final    Comment: (NOTE) The Coronavirus on the Respiratory Panel, DOES NOT test for the novel  Coronavirus (2019 nCoV)    Coronavirus HKU1 NOT DETECTED NOT DETECTED Final   Coronavirus NL63 NOT DETECTED NOT DETECTED Final   Coronavirus OC43 NOT DETECTED NOT DETECTED Final   Metapneumovirus NOT DETECTED NOT DETECTED Final    Rhinovirus / Enterovirus NOT DETECTED NOT DETECTED Final   Influenza A NOT DETECTED NOT DETECTED Final   Influenza B NOT DETECTED NOT DETECTED Final   Parainfluenza Virus 1 NOT DETECTED NOT DETECTED Final   Parainfluenza Virus 2 NOT DETECTED NOT DETECTED Final   Parainfluenza Virus 3 NOT DETECTED NOT DETECTED Final   Parainfluenza Virus 4 NOT DETECTED NOT DETECTED Final   Respiratory Syncytial Virus NOT DETECTED NOT DETECTED Final   Bordetella pertussis NOT DETECTED NOT DETECTED Final   Chlamydophila pneumoniae NOT DETECTED NOT DETECTED Final   Mycoplasma pneumoniae NOT DETECTED NOT DETECTED Final    Comment: Performed at [facility], [place]. [address] [place], [place] [address]  MRSA PCR Screening     Status: None   Collection Time: 09/02/18  2:45 PM   Specimen: Nasopharyngeal  Result Value Ref Range Status   MRSA by PCR NEGATIVE NEGATIVE Final    Comment:        The GeneXpert MRSA Assay (FDA approved for NASAL specimens only), is one component of a comprehensive MRSA colonization surveillance program. It is not intended to diagnose MRSA infection nor to guide or monitor treatment for MRSA infections. Performed at [facility], [place] [address] [place], [place] [address]   SARS Coronavirus 2 [facility] order, Performed in [facility] hospital lab)     Status: None   Collection Time: 09/02/18  6:04 PM  Result Value Ref Range Status   SARS Coronavirus 2 NEGATIVE NEGATIVE Final    Comment: (NOTE) If result is NEGATIVE SARS-CoV-2 target nucleic acids are NOT DETECTED. The SARS-CoV-2 RNA is generally detectable in upper and lower  respiratory specimens during the acute phase of infection. The lowest  concentration of SARS-CoV-2 viral copies this assay can detect is 250  copies / mL. A negative result does not preclude SARS-CoV-2 infection  and should not be used as the sole basis for treatment or other  patient management decisions.  A negative result may  occur with  improper specimen collection / handling, submission of specimen other  than nasopharyngeal swab, presence of viral mutation(s) within the  areas targeted by this assay, and inadequate number of viral copies  (<250 copies / mL). A negative result must be combined with clinical  observations, patient history, and epidemiological information. If result is POSITIVE SARS-CoV-2 target nucleic acids are DETECTED. The SARS-CoV-2 RNA is generally detectable in upper and lower  respiratory specimens dur ing the acute phase of infection.  Positive  results are indicative of active infection with SARS-CoV-2.  Clinical  correlation with patient history and other diagnostic information is  necessary to determine patient infection status.  Positive results do  not rule out bacterial infection or co-infection with other viruses. If result is PRESUMPTIVE POSTIVE SARS-CoV-2 nucleic acids MAY BE PRESENT.   A presumptive positive result was obtained on the submitted specimen  and confirmed on repeat testing.  While 2019 novel coronavirus  (SARS-CoV-2) nucleic acids may be present in the submitted sample  additional confirmatory testing may be necessary for epidemiological  and / or clinical management purposes  to differentiate between  SARS-CoV-2 and other Sarbecovirus currently known to infect humans.  If clinically indicated additional testing with an alternate test  methodology [phone]) is advised. The SARS-CoV-2 RNA is generally  detectable in upper and lower respiratory sp ecimens during the acute  phase of infection. The expected result is Negative. Fact Sheet for Patients:  StrictlyIdeas.no Fact Sheet for Healthcare Providers: BankingDealers.co.za This test is not yet approved or cleared by the [place] FDA and has been authorized for detection and/or diagnosis of SARS-CoV-2 by FDA under an Emergency Use Authorization (EUA).  This  EUA will remain in effect (meaning this test can be used) for the duration of the COVID-19 declaration under Section 564(b)(1) of the Act, 21 U.S.C. section 360bbb-3(b)(1), unless the authorization is terminated or revoked sooner. Performed at [facility], [place] [address], [place], [place] [address]       Studies: No results found.  Scheduled Meds: . aspirin EC  81 mg Oral Daily  . doxycycline  100 mg Oral Q12H  . enoxaparin (LOVENOX) injection  40 mg Subcutaneous Q24H  . Melatonin  3 mg Oral QHS  . metoprolol succinate  25 mg Oral Daily  . pantoprazole  40 mg Oral Daily  . risperiDONE  0.25 mg Oral BID    Continuous Infusions: . sodium chloride 1,000 mL (09/02/18 1501)  . cefTRIAXone (ROCEPHIN)  IV 1 g (09/06/18 2145)     LOS: 5 days     Kayleen Memos, MD [facility] Pager [phone]  If 7PM-7AM, please contact night-coverage [email or web address] Password [email or web address] 09/07/2018, 9:15 AM

## 2018-09-08 LAB — CBC WITH DIFFERENTIAL/PLATELET
Abs Immature Granulocytes: 0.06 10*3/uL (ref 0.00–0.07)
Basophils Absolute: 0.1 10*3/uL (ref 0.0–0.1)
Basophils Relative: 1 %
Eosinophils Absolute: 0.1 10*3/uL (ref 0.0–0.5)
Eosinophils Relative: 2 %
HCT: 34 % — ABNORMAL LOW (ref 39.0–52.0)
Hemoglobin: 10.8 g/dL — ABNORMAL LOW (ref 13.0–17.0)
Immature Granulocytes: 1 %
Lymphocytes Relative: 19 %
Lymphs Abs: 1.5 10*3/uL (ref 0.7–4.0)
MCH: 32 pg (ref 26.0–34.0)
MCHC: 31.8 g/dL (ref 30.0–36.0)
MCV: 100.6 fL — ABNORMAL HIGH (ref 80.0–100.0)
Monocytes Absolute: 1 10*3/uL (ref 0.1–1.0)
Monocytes Relative: 12 %
Neutro Abs: 5.5 10*3/uL (ref 1.7–7.7)
Neutrophils Relative %: 65 %
Platelets: 296 10*3/uL (ref 150–400)
RBC: 3.38 MIL/uL — ABNORMAL LOW (ref 4.22–5.81)
RDW: 12.4 % (ref 11.5–15.5)
WBC: 8.3 10*3/uL (ref 4.0–10.5)
nRBC: 0 % (ref 0.0–0.2)

## 2018-09-08 LAB — PROCALCITONIN: Procalcitonin: <0.1 ng/mL

## 2018-09-08 NOTE — Plan of Care (Signed)

## 2018-09-08 NOTE — Progress Notes (Signed)
PROGRESS NOTE  Marvin Wagner TKW:[id number] DOB: Sep 11, 1930 DOA: 09/01/2018 PCP: Marvin Post, MD  HPI/Recap of past 24 hours: Marvin Wagner y.o.male,w CAD s/p CABG, h/o Aortic stenosis s/p AVR, Dementia, apparently presents with fever x2 days, Pt is unable to give any meaningful history. Pt apparently fell sometime last week. Right wrist slightly swollen.   In ED,  T 100.5, P 129 R 23, Bp 144/87 Pox 91% on RA Wt 77.1, kg  R wrist IMPRESSION: 1. No acute osseous abnormality. 2. Mild diffuse soft tissue swelling about the visualized right hand and wrist. 3. Moderate degenerative osteoarthritic changes, most notable at the first [facility] joint.  CXR IMPRESSION: No active disease.  Xray Hip/ pelvis IMPRESSION: 1. No acute osseous abnormality. 2. Mild osteoarthritic changes about the hips bilaterally. 3. Large volume retained stool impacted within the rectal vault, suggesting constipation.  CT brain IMPRESSION: 1. No acute intracranial abnormality. Advanced chronic small vessel ischemic disease and brain atrophy. 2. No evidence for cervical spine fracture. Multi level cervical spondylosis noted.  covid -19 negative  Urinalysis rbc 11-20, wbc 0-5  Na 133, K 4.0, Bun 20, Creatinine 0.86 Ast 20, Alt 13 Wbc 15.2, Hgb 13.8, Plt 184  INR 1.1  Admitted for sepsis 2/2 pneumonia vs cellulitis of R wrist.  Now improved on antibiotics.     09/07/18: Patient seen and examined at bedside.  Fever overnight with T-max of 100.9.  Unclear etiology.  Procalcitonin less than 0.10.  CBC with differential was unremarkable no leukocytosis or elevation in other counts.   Assessment/Plan: Principal Problem:   Sepsis ([place]) Active Problems:   Hx of CABG   Dementia (HCC)   Cellulitis   Sepsis secondary to HCAP versus right wrist cellulitis Presented with leukocytosis and fever Right wrist cellulitis is resolving T-max 100.9 overnight, unclear etiology No  leukocytosis, CBC with differential unremarkable on 09/08/2018 Procalcitonin less than 0.1 on 09/08/2018 Completed 5 days of Rocephin and p.o. doxycycline  Paroxysmal A. fib with RVR Asymptomatic, denies anginal symptoms or palpitations History of a flutter and paroxysmal A. fib previously on warfarin Per review of record anticoagulation stopped due to poor candidate On Toprol-XL 25 mg daily, will increase the dose to 37.5 mg daily Rate is controlled on current management, Toprol-XL 37.5 mg daily. Per record last seen by cardiology in 2017 then managed by his primary care provider.  Prolonged QTC, improving Personally reviewed twelve-lead EKG done this morning which showed QTC 518 Avoid QTC prolonging agents PT twelve-lead EKG done on 09/08/2018 QTC 472  Coagulase-negative bacteremia, likely a contaminant Isolated 1 out of 2 bottles  History of COVID-19 infection Repeated COVID-19 testing negative x2 Afebrile in the last 24 hours O2 sat 96% on room air  Coronary artery disease status Wagner CABG, aortic stenosis status Wagner AVR Continue aspirin 81 mg daily Resume Toprol-XL 37.5 mg daily  Physical debility PT assessed and recommended SNF CSW consulted for placement  DVT prophylaxis: lovenox subcu daily Code Status: DNR Family Communication:  Will call family to update.  Disposition Plan:  Possible discharge to memory care tomorrow 09/09/2018 if no events overnight.  Consultants:   none  Procedures:   none   Objective: Vitals:   09/07/18 2121 09/08/18 0500 09/08/18 0600 09/08/18 1354  BP: 128/64  (!) 153/54 114/67  Pulse: 95  91 79  Resp: 20  18 18   Temp: (!) 100.9 F (38.3 C)  (!) 100.4 F (38 C) 98 F (36.7 C)  TempSrc: Oral  Oral  Oral  SpO2: 93%  95% 97%  Weight:  75.2 kg    Height:        Intake/Output Summary (Last 24 hours) at 09/08/2018 1440 Last data filed at 09/07/2018 2008 Gross per 24 hour  Intake 60 ml  Output 625 ml  Net -565 ml   Filed Weights    09/06/18 0457 09/07/18 0530 09/08/18 0500  Weight: 75.8 kg 75.2 kg 75.2 kg    Exam:  . General: 85 y.o. year-old male well-developed well-nourished.  In no acute distress.  Alert and confused in a state of advanced dementia.  . Cardiovascular: Irregular rate and rhythm no rubs or gallops no JVD thyromegaly noted.   Marland Kitchen Respiratory: Clear to auscultation no wheezes or rales. Poor inspiratory effort.  . Abdomen: Nontender nondistended normal bowel sounds present. . Musculoskeletal: 1+ pitting edema in lower extremities from ankle down.  Marland Kitchen Psychiatry: Mood is appropriate for condition and setting..  Data Reviewed: CBC: Recent Labs  Lab 09/01/18 2128 09/02/18 0455 09/03/18 0449 09/04/18 0357 09/05/18 0416 09/08/18 0622  WBC 15.2* 12.4* 10.4 8.6 9.2 8.3  NEUTROABS 11.9*  --   --   --   --  5.5  HGB 13.8 11.1* 10.8* 10.2* 10.8* 10.8*  HCT 40.9 33.9* 32.6* 30.5* 32.4* 34.0*  MCV 98.6 99.1 98.5 99.0 98.5 100.6*  PLT 184 195 209 201 235 824   Basic Metabolic Panel: Recent Labs  Lab 09/02/18 0455 09/03/18 0449 09/04/18 0357 09/05/18 0416 09/07/18 0611  NA 137 138 138 139 141  K 3.6 3.2* 3.8 4.2 3.6  CL 105 106 107 105 107  CO2 22 23 23 22 25   GLUCOSE 103* 110* 107* 106* 117*  BUN 19 17 22  25* 24*  CREATININE 0.79 0.83 0.85 0.81 0.78  CALCIUM 7.7* 7.8* 7.8* 8.0* 8.4*  MG  --  2.2 2.5* 2.6*  --    GFR: Estimated Creatinine Clearance: 71.8 mL/min (by C-G formula based on SCr of 0.78 mg/dL). Liver Function Tests: Recent Labs  Lab 09/01/18 2128 09/02/18 0455 09/03/18 0449 09/04/18 0357 09/05/18 0416  AST 20 13* 15 19 26   ALT 13 10 12 11 15   ALKPHOS 64 50 50 50 60  BILITOT 1.2 0.9 0.8 0.7 1.2  PROT 6.9 5.5* 5.4* 5.4* 6.1*  ALBUMIN 3.2* 2.6* 2.4* 2.3* 2.5*   No results for input(s): LIPASE, AMYLASE in the last 168 hours. No results for input(s): AMMONIA in the last 168 hours. Coagulation Profile: Recent Labs  Lab 09/01/18 2128  INR 1.1   Cardiac Enzymes: No  results for input(s): CKTOTAL, CKMB, CKMBINDEX, TROPONINI in the last 168 hours. BNP (last 3 results) No results for input(s): PROBNP in the last 8760 hours. HbA1C: No results for input(s): HGBA1C in the last 72 hours. CBG: Recent Labs  Lab 09/01/18 2131  GLUCAP 94   Lipid Profile: No results for input(s): CHOL, HDL, LDLCALC, TRIG, CHOLHDL, LDLDIRECT in the last 72 hours. Thyroid Function Tests: No results for input(s): TSH, T4TOTAL, FREET4, T3FREE, THYROIDAB in the last 72 hours. Anemia Panel: No results for input(s): VITAMINB12, FOLATE, FERRITIN, TIBC, IRON, RETICCTPCT in the last 72 hours. Urine analysis:    Component Value Date/Time   COLORURINE AMBER (A) 09/01/2018 2129   APPEARANCEUR HAZY (A) 09/01/2018 2129   LABSPEC 1.027 09/01/2018 2129   PHURINE 5.0 09/01/2018 2129   GLUCOSEU NEGATIVE 09/01/2018 2129   HGBUR MODERATE (A) 09/01/2018 2129   BILIRUBINUR NEGATIVE 09/01/2018 2129   BILIRUBINUR neg 02/08/2017 1149  KETONESUR 20 (A) 09/01/2018 2129   PROTEINUR 30 (A) 09/01/2018 2129   UROBILINOGEN 0.2 02/08/2017 1149   UROBILINOGEN 0.2 12/15/2008 1405   NITRITE NEGATIVE 09/01/2018 2129   LEUKOCYTESUR NEGATIVE 09/01/2018 2129   Sepsis Labs: @LABRCNTIP (procalcitonin:4,lacticidven:4)  ) Recent Results (from the past 240 hour(s))  Blood Culture (routine x 2)     Status: Abnormal   Collection Time: 09/01/18  9:29 PM   Specimen: BLOOD  Result Value Ref Range Status   Specimen Description   Final    BLOOD LEFT ANTECUBITAL Performed at [place] [address] [place], [place] [address]    Special Requests   Final    BOTTLES DRAWN AEROBIC AND ANAEROBIC Blood Culture results may not be optimal due to an excessive volume of blood received in culture bottles Performed at [place] [address] [place], [place] [address]    Culture  Setup Time   Final    GRAM POSITIVE COCCI IN CLUSTERS AEROBIC BOTTLE ONLY CRITICAL RESULT  CALLED TO, READ BACK BY AND VERIFIED WITH: J GRIMSLEY,PHARMD AT 2240 09/02/2018 BY L BENFIELD    Culture (A)  Final    STAPHYLOCOCCUS SPECIES (COAGULASE NEGATIVE) THE SIGNIFICANCE OF ISOLATING THIS ORGANISM FROM A SINGLE SET OF BLOOD CULTURES WHEN MULTIPLE SETS ARE DRAWN IS UNCERTAIN. PLEASE NOTIFY THE MICROBIOLOGY DEPARTMENT WITHIN ONE WEEK IF SPECIATION AND SENSITIVITIES ARE REQUIRED. Performed at [facility], [place] [address], [place], [place] [address]    Report Status 09/04/2018 FINAL  Final  Urine culture     Status: None   Collection Time: 09/01/18  9:29 PM   Specimen: In/Out Cath Urine  Result Value Ref Range Status   Specimen Description   Final    IN/OUT CATH URINE Performed at [place] [address] [place], [place] [address]    Special Requests   Final    NONE Performed at [facility], [place] [address], [place], [place] [address]    Culture   Final    NO GROWTH Performed at [facility], [place] [address], [place],  [address]    Report Status 09/03/2018 FINAL  Final  Blood Culture ID Panel (Reflexed)     Status: Abnormal   Collection Time: 09/01/18  9:29 PM  Result Value Ref Range Status   Enterococcus species NOT DETECTED NOT DETECTED Final   Listeria monocytogenes NOT DETECTED NOT DETECTED Final   Staphylococcus species DETECTED (A) NOT DETECTED Final    Comment: Methicillin (oxacillin) resistant coagulase negative staphylococcus. Possible blood culture contaminant (unless isolated from more than one blood culture draw or clinical case suggests pathogenicity). No antibiotic treatment is indicated for blood  culture contaminants. CRITICAL RESULT CALLED TO, READ BACK BY AND VERIFIED WITH: J GRIMSLEY,PHARMD AT 2040 09/02/2018 BY L BENFIELD    Staphylococcus aureus (BCID) NOT DETECTED NOT DETECTED Final   Methicillin resistance DETECTED (A) NOT DETECTED Final    Comment: CRITICAL RESULT CALLED TO, READ BACK BY AND  VERIFIED WITH: J GRIMSLEY,PHARMD AT 2040 09/02/2018 BY L BENFIELD    Streptococcus species NOT DETECTED NOT DETECTED Final   Streptococcus agalactiae NOT DETECTED NOT DETECTED Final   Streptococcus pneumoniae NOT DETECTED NOT DETECTED Final   Streptococcus pyogenes NOT DETECTED NOT DETECTED Final   Acinetobacter baumannii NOT DETECTED NOT DETECTED Final   Enterobacteriaceae species NOT DETECTED NOT DETECTED Final   Enterobacter cloacae complex NOT DETECTED NOT DETECTED Final   Escherichia coli NOT DETECTED  NOT DETECTED Final   Klebsiella oxytoca NOT DETECTED NOT DETECTED Final   Klebsiella pneumoniae NOT DETECTED NOT DETECTED Final   Proteus species NOT DETECTED NOT DETECTED Final   Serratia marcescens NOT DETECTED NOT DETECTED Final   Haemophilus influenzae NOT DETECTED NOT DETECTED Final   Neisseria meningitidis NOT DETECTED NOT DETECTED Final   Pseudomonas aeruginosa NOT DETECTED NOT DETECTED Final   Candida albicans NOT DETECTED NOT DETECTED Final   Candida glabrata NOT DETECTED NOT DETECTED Final   Candida krusei NOT DETECTED NOT DETECTED Final   Candida parapsilosis NOT DETECTED NOT DETECTED Final   Candida tropicalis NOT DETECTED NOT DETECTED Final    Comment: Performed at [facility], [place] [address] [place], [place] [address]  Blood Culture (routine x 2)     Status: None   Collection Time: 09/01/18  9:33 PM   Specimen: BLOOD RIGHT FOREARM  Result Value Ref Range Status   Specimen Description   Final    BLOOD RIGHT FOREARM Performed at [facility], [place] [address], [place], [place] [address]    Special Requests   Final    BOTTLES DRAWN AEROBIC AND ANAEROBIC Blood Culture results may not be optimal due to an excessive volume of blood received in culture bottles Performed at [facility] [address] [place], [place] [address]    Culture   Final    NO GROWTH 5 DAYS Performed at [facility], [place] [address] [place], [place] [address]     Report Status 09/06/2018 FINAL  Final  SARS Coronavirus 2 [facility] order, Performed in [facility] hospital lab) Nasopharyngeal Nasopharyngeal Swab     Status: None   Collection Time: 09/01/18  9:33 PM   Specimen: Nasopharyngeal Swab  Result Value Ref Range Status   SARS Coronavirus 2 NEGATIVE NEGATIVE Final    Comment: (NOTE) If result is NEGATIVE SARS-CoV-2 target nucleic acids are NOT DETECTED. The SARS-CoV-2 RNA is generally detectable in upper and lower  respiratory specimens during the acute phase of infection. The lowest  concentration of SARS-CoV-2 viral copies this assay can detect is 250  copies / mL. A negative result does not preclude SARS-CoV-2 infection  and should not be used as the sole basis for treatment or other  patient management decisions.  A negative result may occur with  improper specimen collection / handling, submission of specimen other  than nasopharyngeal swab, presence of viral mutation(s) within the  areas targeted by this assay, and inadequate number of viral copies  (<250 copies / mL). A negative result must be combined with clinical  observations, patient history, and epidemiological information. If result is POSITIVE SARS-CoV-2 target nucleic acids are DETECTED. The SARS-CoV-2 RNA is generally detectable in upper and lower  respiratory specimens dur ing the acute phase of infection.  Positive  results are indicative of active infection with SARS-CoV-2.  Clinical  correlation with patient history and other diagnostic information is  necessary to determine patient infection status.  Positive results do  not rule out bacterial infection or co-infection with other viruses. If result is PRESUMPTIVE POSTIVE SARS-CoV-2 nucleic acids MAY BE PRESENT.   A presumptive positive result was obtained on the submitted specimen  and confirmed on repeat testing.  While 2019 novel coronavirus  (SARS-CoV-2) nucleic acids may be present in the submitted sample   additional confirmatory testing may be necessary for epidemiological  and / or clinical management purposes  to differentiate between  SARS-CoV-2 and  other Sarbecovirus currently known to infect humans.  If clinically indicated additional testing with an alternate test  methodology [id number]) is advised. The SARS-CoV-2 RNA is generally  detectable in upper and lower respiratory sp ecimens during the acute  phase of infection. The expected result is Negative. Fact Sheet for Patients:  StrictlyIdeas.no Fact Sheet for Healthcare Providers: BankingDealers.co.za This test is not yet approved or cleared by the [place] FDA and has been authorized for detection and/or diagnosis of SARS-CoV-2 by FDA under an Emergency Use Authorization (EUA).  This EUA will remain in effect (meaning this test can be used) for the duration of the COVID-19 declaration under Section 564(b)(1) of the Act, 21 U.S.C. section 360bbb-3(b)(1), unless the authorization is terminated or revoked sooner. Performed at [facility], [place] [address], [place], [place] [address]   Respiratory Panel by PCR     Status: None   Collection Time: 09/02/18  2:45 PM   Specimen: Nasopharyngeal Swab; Respiratory  Result Value Ref Range Status   Adenovirus NOT DETECTED NOT DETECTED Final   Coronavirus 229E NOT DETECTED NOT DETECTED Final    Comment: (NOTE) The Coronavirus on the Respiratory Panel, DOES NOT test for the novel  Coronavirus (2019 nCoV)    Coronavirus HKU1 NOT DETECTED NOT DETECTED Final   Coronavirus NL63 NOT DETECTED NOT DETECTED Final   Coronavirus OC43 NOT DETECTED NOT DETECTED Final   Metapneumovirus NOT DETECTED NOT DETECTED Final   Rhinovirus / Enterovirus NOT DETECTED NOT DETECTED Final   Influenza A NOT DETECTED NOT DETECTED Final   Influenza B NOT DETECTED NOT DETECTED Final   Parainfluenza Virus 1 NOT DETECTED NOT DETECTED Final    Parainfluenza Virus 2 NOT DETECTED NOT DETECTED Final   Parainfluenza Virus 3 NOT DETECTED NOT DETECTED Final   Parainfluenza Virus 4 NOT DETECTED NOT DETECTED Final   Respiratory Syncytial Virus NOT DETECTED NOT DETECTED Final   Bordetella pertussis NOT DETECTED NOT DETECTED Final   Chlamydophila pneumoniae NOT DETECTED NOT DETECTED Final   Mycoplasma pneumoniae NOT DETECTED NOT DETECTED Final    Comment: Performed at [facility], [place]. [address] [place], [place] [address]  MRSA PCR Screening     Status: None   Collection Time: 09/02/18  2:45 PM   Specimen: Nasopharyngeal  Result Value Ref Range Status   MRSA by PCR NEGATIVE NEGATIVE Final    Comment:        The GeneXpert MRSA Assay (FDA approved for NASAL specimens only), is one component of a comprehensive MRSA colonization surveillance program. It is not intended to diagnose MRSA infection nor to guide or monitor treatment for MRSA infections. Performed at [facility], [place] [address], [place], [place] [address]   SARS Coronavirus 2 [facility] order, Performed in [facility] hospital lab)     Status: None   Collection Time: 09/02/18  6:04 PM  Result Value Ref Range Status   SARS Coronavirus 2 NEGATIVE NEGATIVE Final    Comment: (NOTE) If result is NEGATIVE SARS-CoV-2 target nucleic acids are NOT DETECTED. The SARS-CoV-2 RNA is generally detectable in upper and lower  respiratory specimens during the acute phase of infection. The lowest  concentration of SARS-CoV-2 viral copies this assay can detect is 250  copies / mL. A negative result does not preclude SARS-CoV-2 infection  and should not be used as the sole basis for treatment or other  patient management decisions.  A negative result may occur with  improper specimen collection /  handling, submission of specimen other  than nasopharyngeal swab, presence of viral mutation(s) within the  areas targeted by this assay, and inadequate number of  viral copies  (<250 copies / mL). A negative result must be combined with clinical  observations, patient history, and epidemiological information. If result is POSITIVE SARS-CoV-2 target nucleic acids are DETECTED. The SARS-CoV-2 RNA is generally detectable in upper and lower  respiratory specimens dur ing the acute phase of infection.  Positive  results are indicative of active infection with SARS-CoV-2.  Clinical  correlation with patient history and other diagnostic information is  necessary to determine patient infection status.  Positive results do  not rule out bacterial infection or co-infection with other viruses. If result is PRESUMPTIVE POSTIVE SARS-CoV-2 nucleic acids MAY BE PRESENT.   A presumptive positive result was obtained on the submitted specimen  and confirmed on repeat testing.  While 2019 novel coronavirus  (SARS-CoV-2) nucleic acids may be present in the submitted sample  additional confirmatory testing may be necessary for epidemiological  and / or clinical management purposes  to differentiate between  SARS-CoV-2 and other Sarbecovirus currently known to infect humans.  If clinically indicated additional testing with an alternate test  methodology [phone]) is advised. The SARS-CoV-2 RNA is generally  detectable in upper and lower respiratory sp ecimens during the acute  phase of infection. The expected result is Negative. Fact Sheet for Patients:  StrictlyIdeas.no Fact Sheet for Healthcare Providers: BankingDealers.co.za This test is not yet approved or cleared by the [place] FDA and has been authorized for detection and/or diagnosis of SARS-CoV-2 by FDA under an Emergency Use Authorization (EUA).  This EUA will remain in effect (meaning this test can be used) for the duration of the COVID-19 declaration under Section 564(b)(1) of the Act, 21 U.S.C. section 360bbb-3(b)(1), unless the authorization is  terminated or revoked sooner. Performed at [facility], [place] [address], [place], [place] [address]       Studies: No results found.  Scheduled Meds: . aspirin EC  81 mg Oral Daily  . enoxaparin (LOVENOX) injection  40 mg Subcutaneous Q24H  . Melatonin  3 mg Oral QHS  . metoprolol succinate  37.5 mg Oral Daily  . pantoprazole  40 mg Oral Daily  . risperiDONE  0.25 mg Oral BID    Continuous Infusions: . sodium chloride 1,000 mL (09/02/18 1501)     LOS: 6 days     Kayleen Memos, MD [facility] Pager [phone]  If 7PM-7AM, please contact night-coverage [email or web address] Password [email or web address] 09/08/2018, 2:40 PM

## 2018-09-08 NOTE — TOC Progression Note (Signed)
Transition of Care [facility]) - Progression Note    Patient Details  Name: Marvin Wagner MRN: [id number] Date of Birth: 1930-12-09  [facility] CM/SW Contact  Servando Snare, [place] Phone Number: 09/08/2018, 1:31 PM  Clinical Narrative:   LCSW spoke with patients legal guardian, Lelon Frohlich. Ann prefers for patient to return to facility with 24/hr supervision. Guardian is able to pay for additional care.     Expected Discharge Plan: Memory Care Barriers to Discharge: Continued Medical Work up  Expected Discharge Plan and Services Expected Discharge Plan: Memory Care   Discharge Planning Services: CM Consult   Living arrangements for the past 2 months: Assisted Living Facility                 DME Arranged: N/A DME Agency: NA       HH Arranged: NA HH Agency: NA         Social Determinants of Health (SDOH) Interventions    Readmission Risk Interventions No flowsheet data found.

## 2018-09-08 NOTE — Progress Notes (Signed)
This nurse received pt from [place], [place]. Agree with previous assessment. Will continue to monitor.

## 2018-09-09 LAB — CREATININE, SERUM
Creatinine, Ser: 0.78 mg/dL (ref 0.61–1.24)
GFR calc Af Amer: >60 mL/min (ref >60)
GFR calc non Af Amer: >60 mL/min (ref >60)

## 2018-09-09 MED ORDER — METOPROLOL SUCCINATE ER 25 MG PO TB24: 37.5000 mg | ORAL_TABLET | Freq: Every day | ORAL | 0 refills | Status: AC

## 2018-09-09 NOTE — Discharge Summary (Signed)
Discharge Summary  Marvin Wagner VWU:[id number] DOB: May 04, 1930  PCP: Eulas Post, MD  Admit date: 09/01/2018 Discharge date: 09/09/2018  Time spent: 35 minutes  Recommendations for Outpatient Follow-up:  1. Follow-up with your primary care provider 2. Take your medications as prescribed 3. Continue physical therapy as tolerated 4. Fall precautions  Discharge Diagnoses:  Active Hospital Problems   Diagnosis Date Noted   Sepsis ([place]) 09/02/2018   Cellulitis 09/02/2018   Dementia ([place]) 03/04/2015   Hx of CABG 07/01/2010    Resolved Hospital Problems  No resolved problems to display.    Discharge Condition: Stable  Diet recommendation: Resume previous diet  Vitals:   09/08/18 2155 09/09/18 0523  BP:  140/88  Pulse: 83 78  Resp:  20  Temp:  97.8 F (36.6 C)  SpO2:  99%    History of present illness:  Record a87 y.o.male,w CAD s/p CABG, h/o Aortic stenosis s/p AVR, chronic A. fib not on anticoagulation due to fall risk,dementia, who presented from memory care facility with fever x2 days.  Apparently fell sometimes last week. Right wrist slightly swollen no fracture per x-ray report.   In ED,  T 100.5, P 129 R 23, Bp 144/87 Pox 91% on RA Wt 77.1, kg  R wrist IMPRESSION: 1. No acute osseous abnormality. 2. Mild diffuse soft tissue swelling about the visualized right hand and wrist. 3. Moderate degenerative osteoarthritic changes, most notable at the first [facility] joint.  CXR IMPRESSION: No active disease.  Xray Hip/ pelvis IMPRESSION: 1. No acute osseous abnormality. 2. Mild osteoarthritic changes about the hips bilaterally. 3. Large volume retained stool impacted within the rectal vault, suggesting constipation.  CT brain IMPRESSION: 1. No acute intracranial abnormality. Advanced chronic small vessel ischemic disease and brain atrophy. 2. No evidence for cervical spine fracture. Multi level cervical spondylosis  noted.  covid -19 negative  Urinalysis rbc 11-20, wbc 0-5  Na 133, K 4.0, Bun 20, Creatinine 0.86 Ast 20, Alt 13 Wbc 15.2, Hgb 13.8, Plt 184  INR 1.1  Admitted for sepsis 2/2 pneumonia vs cellulitis of R wrist. Improved on antibiotics. Cellulitis and pneumonia resolved.  09/09/18: Patient was seen and examined at his bedside this morning no acute events overnight.  Afebrile in the last 24 hours.  Procalcitonin less than 0.10, CBC with differential unremarkable, no sign of active infective process.  On the day of discharge, the patient was hemodynamically stable.  He will need to follow-up with his primary care provider posthospitalization.  He will also need to abide by fall precautions.    Hospital Course:  Principal Problem:   Sepsis ([place]) Active Problems:   Hx of CABG   Dementia ([facility])   Cellulitis  Resolved sepsis secondary to HCAP versus right wrist cellulitis Presented with leukocytosis and fever Right wrist cellulitis is resolved Afebrile in the last 24 hours. CBC with differential unremarkable, no sign of active infective process. Procalcitonin less than 0.1 on 09/08/2018. Completed 5 days of IV Rocephin P.o. doxycycline to end on 09/10/18 Follow-up with your PCP  Paroxysmal A. fib with RVR, RVR has resolved Currently rate controlled on Toprol-XL Vital signs on 09/09/2018: Blood pressure 140/88, heart rate 78, respiration rate 20, O2 sat 99% on room air. Not on anticoagulation due to fall risk  Resolved prolonged QTC Last twelve-lead EKG done on 09/08/2018 QTC 472 Continue to avoid QTC prolonging agents  Coagulase-negative bacteremia, likely a contaminant Isolated 1 out of 2 bottles, likely contaminant Vital signs and labs reviewed and  no sign of active infective process Afebrile, no leukocytosis Procalcitonin less than 0.10  History of COVID-19 infection Repeated COVID-19 testing negative x2 Afebrile in the last 24 hours O2 saturation 99% on room  air on 09/09/2018  Coronary artery disease status post CABG, aortic stenosis status post AVR Continue aspirin 81 mg daily Continue Toprol-XL 37.5 mg daily  Physical debility PT assessed and recommended SNF CSW consulted for placement Continue physical therapy as tolerated Fall precautions.   Code Status:DNR  Consultants:  none  Procedures:  none    Discharge Exam: BP 140/88 (BP Location: Right Arm)    Pulse 78    Temp 97.8 F (36.6 C) (Axillary)    Resp 20    Ht 6' (1.829 m)    Wt 75.3 kg    SpO2 99%    BMI 22.51 kg/m   General: 85 y.o. year-old male well developed well nourished in no acute distress.  Alert and pleasantly confused in a state of advanced dementia.  Cardiovascular: Regular rate and rhythm with no rubs or gallops.  No thyromegaly or JVD noted.    Respiratory: Clear to auscultation with no wheezes or rales. Good inspiratory effort.  Abdomen: Soft nontender nondistended with normal bowel sounds x4 quadrants.  Musculoskeletal: No lower extremity edema. 2/4 pulses in all 4 extremities.  Psychiatry: Mood is appropriate for condition and setting  Discharge Instructions You were cared for by a hospitalist during your hospital stay. If you have any questions about your discharge medications or the care you received while you were in the hospital after you are discharged, you can call the unit and asked to speak with the hospitalist on call if the hospitalist that took care of you is not available. Once you are discharged, your primary care physician will handle any further medical issues. Please note that NO REFILLS for any discharge medications will be authorized once you are discharged, as it is imperative that you return to your primary care physician (or establish a relationship with a primary care physician if you do not have one) for your aftercare needs so that they can reassess your need for medications and monitor your lab values.   Allergies as of  09/09/2018      Reactions   Ativan [lorazepam]    Penicillins Other (See Comments)   Has patient had a PCN reaction causing immediate rash, facial/tongue/throat swelling, SOB or lightheadedness with hypotension: Unknown Has patient had a PCN reaction causing severe rash involving mucus membranes or skin necrosis: Unknown Has patient had a PCN reaction that required hospitalization: Unknown Has patient had a PCN reaction occurring within the last 10 years: Unknown If all of the above answers are "NO", then may proceed with Cephalosporin use.      Medication List    STOP taking these medications   acetaminophen 325 MG tablet Commonly known as: TYLENOL   atorvastatin 40 MG tablet Commonly known as: LIPITOR   donepezil 10 MG tablet Commonly known as: ARICEPT   memantine 10 MG tablet Commonly known as: NAMENDA   metoprolol tartrate 25 MG tablet Commonly known as: LOPRESSOR   ondansetron 8 MG disintegrating tablet Commonly known as: Zofran ODT     TAKE these medications   aspirin EC 81 MG tablet Take 81 mg by mouth daily.   [facility] Tussin DM 10-100 MG/5ML liquid Generic drug: dextromethorphan-guaiFENesin Take 10 mLs by mouth every 6 (six) hours as needed for cough or congestion.   doxycycline 100 MG tablet Commonly known  as: VIBRA-TABS Take 1 tablet (100 mg total) by mouth every 12 (twelve) hours for 5 days.   Melatonin 3 MG Tabs Take 3 mg by mouth at bedtime.   metoprolol succinate 25 MG 24 hr tablet Commonly known as: TOPROL-XL Take 1.5 tablets (37.5 mg total) by mouth daily. Start taking on: September 10, 2018 What changed: how much to take   pantoprazole 40 MG tablet Commonly known as: PROTONIX Take 1 tablet (40 mg total) by mouth daily.   risperiDONE 0.25 MG tablet Commonly known as: RisperDAL Take 1 tablet (0.25 mg total) by mouth 2 (two) times daily as needed (anxiety). What changed: when to take this      Allergies  Allergen Reactions   Ativan [Lorazepam]     Penicillins Other (See Comments)    Has patient had a PCN reaction causing immediate rash, facial/tongue/throat swelling, SOB or lightheadedness with hypotension: Unknown Has patient had a PCN reaction causing severe rash involving mucus membranes or skin necrosis: Unknown Has patient had a PCN reaction that required hospitalization: Unknown Has patient had a PCN reaction occurring within the last 10 years: Unknown If all of the above answers are "NO", then may proceed with Cephalosporin use.    Follow-up Information    Eulas Post, MD. Call in 1 day(s).   Specialty: Family Medicine Why: Please call for a post hospital follow-up appointment. Contact information: [place] [address] [phone]            The results of significant diagnostics from this hospitalization (including imaging, microbiology, ancillary and laboratory) are listed below for reference.    Significant Diagnostic Studies: Dg Wrist Complete Right  Result Date: 09/01/2018 CLINICAL DATA:  Initial evaluation for acute warmth and swelling about the right wrist. History of recent fall. EXAM: RIGHT WRIST - COMPLETE 3+ VIEW COMPARISON:  None. FINDINGS: No acute fracture or dislocation. Normal radiocarpal and distal radioulnar articulations maintained. Mild degenerative changes with chondrocalcinosis noted about the wrist. Prominent osteoarthritic changes noted at the first [facility] joint. Mild diffuse soft tissue swelling about the visualized right hand and wrist. IMPRESSION: 1. No acute osseous abnormality. 2. Mild diffuse soft tissue swelling about the visualized right hand and wrist. 3. Moderate degenerative osteoarthritic changes, most notable at the first [facility] joint. Electronically Signed   By: Jeannine Boga M.D.   On: 09/01/2018 22:57   Ct Head Wo Contrast  Result Date: 09/01/2018 CLINICAL DATA:  Fevers.  Dementia.  Head trauma. EXAM: CT HEAD WITHOUT CONTRAST CT CERVICAL SPINE  WITHOUT CONTRAST TECHNIQUE: Multidetector CT imaging of the head and cervical spine was performed following the standard protocol without intravenous contrast. Multiplanar CT image reconstructions of the cervical spine were also generated. COMPARISON:  06/18/2018 FINDINGS: CT HEAD FINDINGS Brain: No evidence of acute infarction, hemorrhage, hydrocephalus, extra-axial collection or mass lesion/mass effect. There is mild diffuse low-attenuation within the subcortical and periventricular white matter compatible with chronic microvascular disease. Prominence of sulci and ventricles compatible with brain atrophy. Vascular: No hyperdense vessel or unexpected calcification. Skull: Normal. Negative for fracture or focal lesion. Sinuses/Orbits: No acute finding. Other: None CT CERVICAL SPINE FINDINGS Alignment: Normal. Skull base and vertebrae: No acute fracture. No primary bone lesion or focal pathologic process. Soft tissues and spinal canal: No prevertebral fluid or swelling. No visible canal hematoma. Disc levels: Disc space narrowing in endplate spurring identified at C5-6, C6-7 and C7-T1. Upper chest: Negative. Other: None IMPRESSION: 1. No acute intracranial abnormality. Advanced chronic small vessel  ischemic disease and brain atrophy. 2. No evidence for cervical spine fracture. Multi level cervical spondylosis noted. Electronically Signed   By: Kerby Moors M.D.   On: 09/01/2018 23:19   Ct Angio Chest Pe W Or Wo Contrast  Result Date: 09/02/2018 CLINICAL DATA:  Fever, dementia, mental status change, recent fall, history of pneumonia and COVID-19 EXAM: CT ANGIOGRAPHY CHEST WITH CONTRAST TECHNIQUE: Multidetector CT imaging of the chest was performed using the standard protocol during bolus administration of intravenous contrast. Multiplanar CT image reconstructions and MIPs were obtained to evaluate the vascular anatomy. CONTRAST:  172mL OMNIPAQUE IOHEXOL 350 MG/ML SOLN COMPARISON:  02/24/2016 FINDINGS:  Cardiovascular: Heart size normal. No pericardial effusion. Mildly dilated right pulmonary artery. Satisfactory opacification of pulmonary arteries noted, and there is no evidence of pulmonary emboli. Previous AVR. Scattered coronary calcifications, post CABG. Adequate contrast opacification of the thoracic aorta with no evidence of dissection, aneurysm, or stenosis. There is classic 3-vessel brachiocephalic arch anatomy without proximal stenosis. Coarse moderate atheromatous calcifications in the distal arch and descending thoracic segment. Mild atheromatous change in the proximal visualized abdominal aorta without dilatation. Mediastinum/Nodes: Small hiatal hernia. No pleural or pericardial effusion. Lungs/Pleura: Trace pleural effusions left greater than right. No pneumothorax. Coarse linear opacities peripherally in both upper lobes. Poorly marginated ground-glass opacity centrally in both upper lobes, and left perihilar region. No airspace consolidation. Upper Abdomen: No acute findings. Musculoskeletal: Chest wall hematoma seen previously has resolved. Sternotomy wires. No acute fracture or worrisome bone lesion. Review of the MIP images confirms the above findings. IMPRESSION: 1. Negative for acute PE or thoracic aortic dissection. 2. Trace bilateral pleural effusions left greater than right. 3. Patchy ground-glass opacities in both upper lobes and left perihilar region, possibly infectious/inflammatory. 4. Coronary and Aortic Atherosclerosis (ICD10-I70.0), post AVR and CABG. Electronically Signed   By: Lucrezia Europe M.D.   On: 09/02/2018 14:13   Ct Cervical Spine Wo Contrast  Result Date: 09/01/2018 CLINICAL DATA:  Fevers.  Dementia.  Head trauma. EXAM: CT HEAD WITHOUT CONTRAST CT CERVICAL SPINE WITHOUT CONTRAST TECHNIQUE: Multidetector CT imaging of the head and cervical spine was performed following the standard protocol without intravenous contrast. Multiplanar CT image reconstructions of the cervical  spine were also generated. COMPARISON:  06/18/2018 FINDINGS: CT HEAD FINDINGS Brain: No evidence of acute infarction, hemorrhage, hydrocephalus, extra-axial collection or mass lesion/mass effect. There is mild diffuse low-attenuation within the subcortical and periventricular white matter compatible with chronic microvascular disease. Prominence of sulci and ventricles compatible with brain atrophy. Vascular: No hyperdense vessel or unexpected calcification. Skull: Normal. Negative for fracture or focal lesion. Sinuses/Orbits: No acute finding. Other: None CT CERVICAL SPINE FINDINGS Alignment: Normal. Skull base and vertebrae: No acute fracture. No primary bone lesion or focal pathologic process. Soft tissues and spinal canal: No prevertebral fluid or swelling. No visible canal hematoma. Disc levels: Disc space narrowing in endplate spurring identified at C5-6, C6-7 and C7-T1. Upper chest: Negative. Other: None IMPRESSION: 1. No acute intracranial abnormality. Advanced chronic small vessel ischemic disease and brain atrophy. 2. No evidence for cervical spine fracture. Multi level cervical spondylosis noted. Electronically Signed   By: Kerby Moors M.D.   On: 09/01/2018 23:19   Ct Wrist Right W Contrast  Result Date: 09/02/2018 CLINICAL DATA:  [place].  Wrist pain. EXAM: CT OF THE UPPER RIGHT EXTREMITY WITH CONTRAST TECHNIQUE: Multidetector CT imaging of the upper right extremity was performed according to the standard protocol following intravenous contrast administration. COMPARISON:  Wrist radiographs 09/01/2018 CONTRAST:  158mL OMNIPAQUE IOHEXOL 350 MG/ML SOLN FINDINGS: The joint spaces are fairly well maintained for the patient's age. Mild degenerative changes. No acute wrist fractures identified. Carpal alignment is normal. Advanced degenerative changes at the [facility] joint of the thumb but no acute fracture. The visualized metacarpal bones are intact. No fracture of the distal radius or ulna. Slight widening of  the scapholunate joint space could suggest a ligament tear. Diffuse subcutaneous soft tissue swelling/edema could be posttraumatic or cellulitis. IMPRESSION: 1. No definite acute wrist fracture. 2. Advanced degenerative changes at the [facility] joint of the thumb. 3. Widening of the scapholunate joint space could suggest a ligament tear or ligament insufficiency. 4. Diffuse subcutaneous soft tissue swelling. Electronically Signed   By: Marijo Sanes M.D.   On: 09/02/2018 13:49   Dg Chest Port 1 View  Result Date: 09/01/2018 CLINICAL DATA:  Shortness of breath EXAM: PORTABLE CHEST 1 VIEW COMPARISON:  06/18/2018 FINDINGS: Prior median sternotomy and valve replacement. Mild cardiomegaly. Aortic atherosclerosis. No confluent opacity, effusion or edema. No acute bony abnormality. IMPRESSION: No active disease. Electronically Signed   By: Rolm Baptise M.D.   On: 09/01/2018 22:54   Dg Hips Bilat W Or Wo Pelvis 3-4 Views  Result Date: 09/01/2018 CLINICAL DATA:  Initial evaluation for recent trauma, fall. EXAM: DG HIP (WITH OR WITHOUT PELVIS) 3-4V BILAT COMPARISON:  None available. FINDINGS: No acute fracture dislocation. Femoral heads in normal alignment within the acetabula. Femoral head heights maintained. Bony pelvis intact. Mild osteoarthritic changes present about the hips bilaterally. Degenerative changes noted within the lower lumbar spine. No definite acute soft tissue abnormality. Large volume retained stool seen impacted within the rectal vault. IMPRESSION: 1. No acute osseous abnormality. 2. Mild osteoarthritic changes about the hips bilaterally. 3. Large volume retained stool impacted within the rectal vault, suggesting constipation. Electronically Signed   By: Jeannine Boga M.D.   On: 09/01/2018 23:00    Microbiology: Recent Results (from the past 240 hour(s))  Blood Culture (routine x 2)     Status: Abnormal   Collection Time: 09/01/18  9:29 PM   Specimen: BLOOD  Result Value Ref Range Status    Specimen Description   Final    BLOOD LEFT ANTECUBITAL Performed at [facility] [address] [place], [place] [address]    Special Requests   Final    BOTTLES DRAWN AEROBIC AND ANAEROBIC Blood Culture results may not be optimal due to an excessive volume of blood received in culture bottles Performed at [facility] [address], [place], [place] [address]    Culture  Setup Time   Final    GRAM POSITIVE COCCI IN CLUSTERS AEROBIC BOTTLE ONLY CRITICAL RESULT CALLED TO, READ BACK BY AND VERIFIED WITH: J GRIMSLEY,PHARMD AT 2240 09/02/2018 BY L BENFIELD    Culture (A)  Final    STAPHYLOCOCCUS SPECIES (COAGULASE NEGATIVE) THE SIGNIFICANCE OF ISOLATING THIS ORGANISM FROM A SINGLE SET OF BLOOD CULTURES WHEN MULTIPLE SETS ARE DRAWN IS UNCERTAIN. PLEASE NOTIFY THE MICROBIOLOGY DEPARTMENT WITHIN ONE WEEK IF SPECIATION AND SENSITIVITIES ARE REQUIRED. Performed at [facility], [place] [address], [place], [place] [address]    Report Status 09/04/2018 FINAL  Final  Urine culture     Status: None   Collection Time: 09/01/18  9:29 PM   Specimen: In/Out Cath Urine  Result Value Ref Range Status   Specimen Description   Final    IN/OUT CATH URINE Performed at [facility], [address] [place], [place] [address]    Special Requests   Final    NONE Performed at [facility], [place] [address], [place], [place] [address]    Culture   Final    NO GROWTH Performed at [facility], [place] [address], [place], [place] [address]    Report Status 09/03/2018 FINAL  Final  Blood Culture ID Panel (Reflexed)     Status: Abnormal   Collection Time: 09/01/18  9:29 PM  Result Value Ref Range Status   Enterococcus species NOT DETECTED NOT DETECTED Final   Listeria monocytogenes NOT DETECTED NOT DETECTED Final   Staphylococcus species DETECTED (A) NOT DETECTED Final    Comment: Methicillin (oxacillin) resistant coagulase  negative staphylococcus. Possible blood culture contaminant (unless isolated from more than one blood culture draw or clinical case suggests pathogenicity). No antibiotic treatment is indicated for blood  culture contaminants. CRITICAL RESULT CALLED TO, READ BACK BY AND VERIFIED WITH: J GRIMSLEY,PHARMD AT 2040 09/02/2018 BY L BENFIELD    Staphylococcus aureus (BCID) NOT DETECTED NOT DETECTED Final   Methicillin resistance DETECTED (A) NOT DETECTED Final    Comment: CRITICAL RESULT CALLED TO, READ BACK BY AND VERIFIED WITH: J GRIMSLEY,PHARMD AT 2040 09/02/2018 BY L BENFIELD    Streptococcus species NOT DETECTED NOT DETECTED Final   Streptococcus agalactiae NOT DETECTED NOT DETECTED Final   Streptococcus pneumoniae NOT DETECTED NOT DETECTED Final   Streptococcus pyogenes NOT DETECTED NOT DETECTED Final   Acinetobacter baumannii NOT DETECTED NOT DETECTED Final   Enterobacteriaceae species NOT DETECTED NOT DETECTED Final   Enterobacter cloacae complex NOT DETECTED NOT DETECTED Final   Escherichia coli NOT DETECTED NOT DETECTED Final   Klebsiella oxytoca NOT DETECTED NOT DETECTED Final   Klebsiella pneumoniae NOT DETECTED NOT DETECTED Final   Proteus species NOT DETECTED NOT DETECTED Final   Serratia marcescens NOT DETECTED NOT DETECTED Final   Haemophilus influenzae NOT DETECTED NOT DETECTED Final   Neisseria meningitidis NOT DETECTED NOT DETECTED Final   Pseudomonas aeruginosa NOT DETECTED NOT DETECTED Final   Candida albicans NOT DETECTED NOT DETECTED Final   Candida glabrata NOT DETECTED NOT DETECTED Final   Candida krusei NOT DETECTED NOT DETECTED Final   Candida parapsilosis NOT DETECTED NOT DETECTED Final   Candida tropicalis NOT DETECTED NOT DETECTED Final    Comment: Performed at [facility], [place]. [address], [place], [place] [address]  Blood Culture (routine x 2)     Status: None   Collection Time: 09/01/18  9:33 PM   Specimen: BLOOD RIGHT FOREARM  Result Value Ref Range  Status   Specimen Description   Final    BLOOD RIGHT FOREARM Performed at [facility], [place] [address] [place], [place] [address]    Special Requests   Final    BOTTLES DRAWN AEROBIC AND ANAEROBIC Blood Culture results may not be optimal due to an excessive volume of blood received in culture bottles Performed at [facility] [address] [place], [place] [address]    Culture   Final    NO GROWTH 5 DAYS Performed at [facility], [place] [address] [place], [place] [address]    Report Status 09/06/2018 FINAL  Final  SARS Coronavirus 2 [facility] order, Performed in [facility] hospital lab) Nasopharyngeal Nasopharyngeal Swab     Status: None   Collection Time: 09/01/18  9:33 PM   Specimen: Nasopharyngeal Swab  Result Value Ref Range Status  SARS Coronavirus 2 NEGATIVE NEGATIVE Final    Comment: (NOTE) If result is NEGATIVE SARS-CoV-2 target nucleic acids are NOT DETECTED. The SARS-CoV-2 RNA is generally detectable in upper and lower  respiratory specimens during the acute phase of infection. The lowest  concentration of SARS-CoV-2 viral copies this assay can detect is 250  copies / mL. A negative result does not preclude SARS-CoV-2 infection  and should not be used as the sole basis for treatment or other  patient management decisions.  A negative result may occur with  improper specimen collection / handling, submission of specimen other  than nasopharyngeal swab, presence of viral mutation(s) within the  areas targeted by this assay, and inadequate number of viral copies  (<250 copies / mL). A negative result must be combined with clinical  observations, patient history, and epidemiological information. If result is POSITIVE SARS-CoV-2 target nucleic acids are DETECTED. The SARS-CoV-2 RNA is generally detectable in upper and lower  respiratory specimens dur ing the acute phase of infection.  Positive  results are indicative of active infection  with SARS-CoV-2.  Clinical  correlation with patient history and other diagnostic information is  necessary to determine patient infection status.  Positive results do  not rule out bacterial infection or co-infection with other viruses. If result is PRESUMPTIVE POSTIVE SARS-CoV-2 nucleic acids MAY BE PRESENT.   A presumptive positive result was obtained on the submitted specimen  and confirmed on repeat testing.  While 2019 novel coronavirus  (SARS-CoV-2) nucleic acids may be present in the submitted sample  additional confirmatory testing may be necessary for epidemiological  and / or clinical management purposes  to differentiate between  SARS-CoV-2 and other Sarbecovirus currently known to infect humans.  If clinically indicated additional testing with an alternate test  methodology [phone]) is advised. The SARS-CoV-2 RNA is generally  detectable in upper and lower respiratory sp ecimens during the acute  phase of infection. The expected result is Negative. Fact Sheet for Patients:  StrictlyIdeas.no Fact Sheet for Healthcare Providers: BankingDealers.co.za This test is not yet approved or cleared by the [place] FDA and has been authorized for detection and/or diagnosis of SARS-CoV-2 by FDA under an Emergency Use Authorization (EUA).  This EUA will remain in effect (meaning this test can be used) for the duration of the COVID-19 declaration under Section 564(b)(1) of the Act, 21 U.S.C. section 360bbb-3(b)(1), unless the authorization is terminated or revoked sooner. Performed at [facility], [place] [address] [place], [place] [address]   Respiratory Panel by PCR     Status: None   Collection Time: 09/02/18  2:45 PM   Specimen: Nasopharyngeal Swab; Respiratory  Result Value Ref Range Status   Adenovirus NOT DETECTED NOT DETECTED Final   Coronavirus 229E NOT DETECTED NOT DETECTED Final    Comment: (NOTE) The  Coronavirus on the Respiratory Panel, DOES NOT test for the novel  Coronavirus (2019 nCoV)    Coronavirus HKU1 NOT DETECTED NOT DETECTED Final   Coronavirus NL63 NOT DETECTED NOT DETECTED Final   Coronavirus OC43 NOT DETECTED NOT DETECTED Final   Metapneumovirus NOT DETECTED NOT DETECTED Final   Rhinovirus / Enterovirus NOT DETECTED NOT DETECTED Final   Influenza A NOT DETECTED NOT DETECTED Final   Influenza B NOT DETECTED NOT DETECTED Final   Parainfluenza Virus 1 NOT DETECTED NOT DETECTED Final   Parainfluenza Virus 2 NOT DETECTED NOT DETECTED Final   Parainfluenza Virus 3 NOT DETECTED NOT DETECTED Final   Parainfluenza Virus 4  NOT DETECTED NOT DETECTED Final   Respiratory Syncytial Virus NOT DETECTED NOT DETECTED Final   Bordetella pertussis NOT DETECTED NOT DETECTED Final   Chlamydophila pneumoniae NOT DETECTED NOT DETECTED Final   Mycoplasma pneumoniae NOT DETECTED NOT DETECTED Final    Comment: Performed at [facility], [place] [address] [place], [place] [address]  MRSA PCR Screening     Status: None   Collection Time: 09/02/18  2:45 PM   Specimen: Nasopharyngeal  Result Value Ref Range Status   MRSA by PCR NEGATIVE NEGATIVE Final    Comment:        The GeneXpert MRSA Assay (FDA approved for NASAL specimens only), is one component of a comprehensive MRSA colonization surveillance program. It is not intended to diagnose MRSA infection nor to guide or monitor treatment for MRSA infections. Performed at [facility], [place] [address] [place], [place] [address]   SARS Coronavirus 2 [facility] order, Performed in [facility] hospital lab)     Status: None   Collection Time: 09/02/18  6:04 PM  Result Value Ref Range Status   SARS Coronavirus 2 NEGATIVE NEGATIVE Final    Comment: (NOTE) If result is NEGATIVE SARS-CoV-2 target nucleic acids are NOT DETECTED. The SARS-CoV-2 RNA is generally detectable in upper and lower  respiratory specimens during the  acute phase of infection. The lowest  concentration of SARS-CoV-2 viral copies this assay can detect is 250  copies / mL. A negative result does not preclude SARS-CoV-2 infection  and should not be used as the sole basis for treatment or other  patient management decisions.  A negative result may occur with  improper specimen collection / handling, submission of specimen other  than nasopharyngeal swab, presence of viral mutation(s) within the  areas targeted by this assay, and inadequate number of viral copies  (<250 copies / mL). A negative result must be combined with clinical  observations, patient history, and epidemiological information. If result is POSITIVE SARS-CoV-2 target nucleic acids are DETECTED. The SARS-CoV-2 RNA is generally detectable in upper and lower  respiratory specimens dur ing the acute phase of infection.  Positive  results are indicative of active infection with SARS-CoV-2.  Clinical  correlation with patient history and other diagnostic information is  necessary to determine patient infection status.  Positive results do  not rule out bacterial infection or co-infection with other viruses. If result is PRESUMPTIVE POSTIVE SARS-CoV-2 nucleic acids MAY BE PRESENT.   A presumptive positive result was obtained on the submitted specimen  and confirmed on repeat testing.  While 2019 novel coronavirus  (SARS-CoV-2) nucleic acids may be present in the submitted sample  additional confirmatory testing may be necessary for epidemiological  and / or clinical management purposes  to differentiate between  SARS-CoV-2 and other Sarbecovirus currently known to infect humans.  If clinically indicated additional testing with an alternate test  methodology [phone]) is advised. The SARS-CoV-2 RNA is generally  detectable in upper and lower respiratory sp ecimens during the acute  phase of infection. The expected result is Negative. Fact Sheet for Patients:   StrictlyIdeas.no Fact Sheet for Healthcare Providers: BankingDealers.co.za This test is not yet approved or cleared by the [place] FDA and has been authorized for detection and/or diagnosis of SARS-CoV-2 by FDA under an Emergency Use Authorization (EUA).  This EUA will remain in effect (meaning this test can be used) for the duration of the COVID-19 declaration under Section 564(b)(1) of the Act, 21 U.S.C. section  360bbb-3(b)(1), unless the authorization is terminated or revoked sooner. Performed at [facility], [place] [address] [place], [place] [address]      Labs: Basic Metabolic Panel: Recent Labs  Lab 09/03/18 [id number] 09/04/18 0357 09/05/18 0416 09/07/18 0611 09/09/18 0211  NA 138 138 139 141  --   K 3.2* 3.8 4.2 3.6  --   CL 106 107 105 107  --   CO2 23 23 22 25   --   GLUCOSE 110* 107* 106* 117*  --   BUN 17 22 25* 24*  --   CREATININE 0.83 0.85 0.81 0.78 0.78  CALCIUM 7.8* 7.8* 8.0* 8.4*  --   MG 2.2 2.5* 2.6*  --   --    Liver Function Tests: Recent Labs  Lab 09/03/18 0449 09/04/18 0357 09/05/18 0416  AST 15 19 26   ALT 12 11 15   ALKPHOS 50 50 60  BILITOT 0.8 0.7 1.2  PROT 5.4* 5.4* 6.1*  ALBUMIN 2.4* 2.3* 2.5*   No results for input(s): LIPASE, AMYLASE in the last 168 hours. No results for input(s): AMMONIA in the last 168 hours. CBC: Recent Labs  Lab 09/03/18 0449 09/04/18 0357 09/05/18 0416 09/08/18 0622  WBC 10.4 8.6 9.2 8.3  NEUTROABS  --   --   --  5.5  HGB 10.8* 10.2* 10.8* 10.8*  HCT 32.6* 30.5* 32.4* 34.0*  MCV 98.5 99.0 98.5 100.6*  PLT 209 201 235 296   Cardiac Enzymes: No results for input(s): CKTOTAL, CKMB, CKMBINDEX, TROPONINI in the last 168 hours. BNP: BNP (last 3 results) No results for input(s): BNP in the last 8760 hours.  ProBNP (last 3 results) No results for input(s): PROBNP in the last 8760 hours.  CBG: No results for input(s): GLUCAP in the last  168 hours.     Signed:  Kayleen Memos, MD [facility] 09/09/2018, 10:49 AM

## 2018-09-09 NOTE — Progress Notes (Signed)
Called legal guardian, Ms. Judi Cong, via phone to give update.  No answer left a voicemail.

## 2018-09-09 NOTE — TOC Progression Note (Signed)
Transition of Care [facility]) - Progression Note    Patient Details  Name: Marvin Wagner MRN: [id number] Date of Birth: 1930-08-06  Transition of Care [facility]) CM/SW [place], LCSW Phone Number: 09/09/2018, 11:54 AM  Clinical Narrative:  Per facility patient is unable to discharge today. Facility stated they will need to assess patient before he can return. Covid test has been re ordered for patient.     Expected Discharge Plan: Memory Care Barriers to Discharge: Continued Medical Work up  Expected Discharge Plan and Services Expected Discharge Plan: Memory Care   Discharge Planning Services: CM Consult   Living arrangements for the past 2 months: [place] Expected Discharge Date: 09/09/18               DME Arranged: N/A DME Agency: NA       HH Arranged: NA HH Agency: NA         Social Determinants of Health (SDOH) Interventions    Readmission Risk Interventions No flowsheet data found.

## 2018-09-09 NOTE — NC FL2 (Signed)
[place] LEVEL OF CARE SCREENING TOOL     IDENTIFICATION  Patient Name: Marvin Wagner Birthdate: [birth year] Sex: male Admission Date (Current Location): 09/01/2018  [facility] and [place] Number:  Herbalist and Address:  [facility],  [place], [place]      Provider Number: [id number]  Attending Physician Name and Address:  Kayleen Memos, DO  Relative Name and Phone Number:  Dorian KGURKYHCW,[phone]    Current Level of Care: Hospital Recommended Level of Care: Assisted Living Facility(return back to memory care) Prior Approval Number:    Date Approved/Denied:   PASRR Number:    Discharge Plan: Home(back to [facility])    Current Diagnoses: Patient Active Problem List   Diagnosis Date Noted  . Sepsis ([place]) 09/02/2018  . Cellulitis 09/02/2018  . Community acquired pneumonia 06/19/2018  . Acute encephalopathy 06/19/2018  . CAP (community acquired pneumonia) 06/19/2018  . Acute blood loss anemia 02/27/2016  . UTI (urinary tract infection) 02/27/2016  . Pressure injury of skin 02/25/2016  . Hematoma of chest wall, left, initial encounter 02/24/2016  . Fever 02/24/2016  . Supratherapeutic INR 02/24/2016  . Dementia ([place]) 03/04/2015  . Hypotension 12/03/2014  . Weight loss, unintentional 10/17/2013  . History of elevated PSA 04/25/2013  . Encounter for therapeutic drug monitoring 02/26/2013  . Long term current use of anticoagulant therapy 12/19/2012  . Other vitamin B12 deficiency anemia 02/29/2012  . Memory disorder 02/14/2012  . Hx of CABG 07/01/2010  . Hyperlipidemia 07/01/2010  . Benign hypertensive heart disease without heart failure 07/01/2010    Orientation RESPIRATION BLADDER Height & Weight     (not oriented)  Normal External catheter, Incontinent Weight: 166 lb 0.1 oz (75.3 kg) Height:  6' (182.9 cm)  BEHAVIORAL SYMPTOMS/MOOD NEUROLOGICAL BOWEL NUTRITION STATUS   Incontinent Diet(heart healthy)  AMBULATORY STATUS COMMUNICATION OF NEEDS Skin   Extensive Assist Verbally(incomprehensible)                         Personal Care Assistance Level of Assistance  Bathing, Feeding, Dressing Bathing Assistance: Maximum assistance Feeding assistance: Limited assistance Dressing Assistance: Maximum assistance     Functional Limitations Info  Sight, Hearing, Speech Sight Info: Impaired(wera glasses) Hearing Info: Impaired Speech Info: Adequate    SPECIAL CARE FACTORS FREQUENCY  PT (By licensed PT), OT (By licensed OT)     PT Frequency: 3x wk OT Frequency: 3x wk            Contractures Contractures Info: Not present    Additional Factors Info  Code Status, Allergies Code Status Info: DNR Allergies Info: Ativan Lorazepam Penicillins           Current Medications (09/09/2018):  This is the current hospital active medication list Current Facility-Administered Medications  Medication Dose Route Frequency Provider Last Rate Last Dose  . 0.9 %  sodium chloride infusion   Intravenous PRN Elodia Florence., MD 10 mL/hr at 09/02/18 1501 1,000 mL at 09/02/18 1501  . acetaminophen (TYLENOL) tablet 650 mg  650 mg Oral Q6H PRN Jani Gravel, MD   650 mg at 09/08/18 1037   Or  . acetaminophen (TYLENOL) suppository 650 mg  650 mg Rectal Q6H PRN Jani Gravel, MD   650 mg at 09/02/18 0134  . aspirin EC tablet 81 mg  81 mg Oral Daily Elodia Florence., MD   81 mg at 09/08/18 1037  . enoxaparin (  LOVENOX) injection 40 mg  40 mg Subcutaneous Q24H Jani Gravel, MD   40 mg at 09/09/18 1036  . Melatonin TABS 3 mg  3 mg Oral QHS Elodia Florence., MD   3 mg at 09/08/18 2156  . metoprolol succinate (TOPROL-XL) 24 hr tablet 37.5 mg  37.5 mg Oral Daily Hall, Carole N, DO   37.5 mg at 09/08/18 1038  . pantoprazole (PROTONIX) EC tablet 40 mg  40 mg Oral Daily Elodia Florence., MD   40 mg at 09/08/18 1038  . risperiDONE (RISPERDAL) tablet 0.25 mg  0.25  mg Oral BID Elodia Florence., MD   0.25 mg at 09/08/18 2156     Discharge Medications: Please see discharge summary for a list of discharge medications.  Relevant Imaging Results:  Relevant Lab Results:   Additional Information SS#: [id number]  Wende Neighbors, LCSW

## 2018-09-10 LAB — SARS CORONAVIRUS 2 (TAT 6-24 HRS): SARS Coronavirus 2: NEGATIVE

## 2018-09-10 NOTE — Progress Notes (Signed)
Report called to Alfredo Bach, [place], LPN. Pt prepared for transfer to [facility].

## 2018-09-10 NOTE — Care Management Important Message (Signed)
Important Message  Patient Details  Name: Marvin Wagner MRN: [id number] Date of Birth: 1930/07/05   Medicare Important Message Given:  Yes. CMA printed out IM for the Case Management Nurse or CSW to give to the patient.      Jovonta Levit 09/10/2018, 11:42 AM

## 2018-09-10 NOTE — Progress Notes (Signed)
Physical Therapy Treatment Patient Details Name: Marvin Wagner MRN: [id number] DOB: [birth year] Today's Date: 09/10/2018    History of Present Illness 85 year old man admitted for sepsis.  PMH:  dementia, CAD, CABG, dementia and aortic valve replacement    PT Comments    Patient making limited progress with therapy secondary to cognitive impairments. He continues to be resistant to mobilizing and requires max/total assist for supine to sit and is unable to follow commands. Pt resistant to attempting sit<>stand transfer and discontinued for safety concerns again. Pt returned to supine in bed. Will continue to progress as able throughout his stay. He will benefit from ongoing skilled care at below venue.    Follow Up Recommendations  SNF;Supervision/Assistance - 24 hour(memory care vs SNF depending on assistance available at memory care facility)     Equipment Recommendations  None recommended by PT    Recommendations for Other Services       Precautions / Restrictions Precautions Precautions: Fall Restrictions Weight Bearing Restrictions: No    Mobility  Bed Mobility Overal bed mobility: Needs Assistance Bed Mobility: Supine to Sit     Supine to sit: Max assist;HOB elevated;+2 for physical assistance;+2 for safety/equipment     General bed mobility comments: pt continues to be unable to follow commands consistently during bed mobility, did not participate in LE scooting this session, max assist need to pivot pt to sit EOB.  Transfers Overall transfer level: Needs assistance Equipment used: 2 person hand held assist Transfers: Sit to/from Stand Sit to Stand: +2 physical assistance;+2 safety/equipment;Total assist;From elevated surface         General transfer comment: attempted sit<>stand 2x however patient with significant resistance to stand and discontinued for safety concerns(pt unable to perform)  Ambulation/Gait                 Stairs              Wheelchair Mobility    Modified Rankin (Stroke Patients Only)       Balance Overall balance assessment: Needs assistance Sitting-balance support: Feet unsupported;Bilateral upper extremity supported Sitting balance-Leahy Scale: Poor Sitting balance - Comments: pt able to support himself at EOB with feet on floor and bil UE support     Standing balance-Leahy Scale: Zero Standing balance comment: attempted sit<>stand, pt unable to get to standing               Cognition Arousal/Alertness: Awake/alert Behavior During Therapy: WFL for tasks assessed/performed Overall Cognitive Status: Impaired/Different from baseline Area of Impairment: Following commands        General Comments: pt unable to follow simple one step commands, pt taking extra time for processing with simple visual cues, pt with history of dementia; worsening speech and understanding per POA.             Pertinent Vitals/Pain Pain Assessment: Faces Faces Pain Scale: Hurts little more Pain Location: pt unable to state but grimacing and guarding LE's during bed mobility Pain Descriptors / Indicators: Grimacing;Guarding Pain Intervention(s): Limited activity within patient's tolerance;Monitored during session;Repositioned           PT Goals (current goals can now be found in the care plan section) Acute Rehab PT Goals Patient Stated Goal: get back to PLOF PT Goal Formulation: Patient unable to participate in goal setting Time For Goal Achievement: 09/04/2018 Potential to Achieve Goals: Fair Progress towards PT goals: Not progressing toward goals - comment(pt limited by cognitive impairments)    Frequency  Min 2X/week      PT Plan Current plan remains appropriate       AM-PAC PT "6 Clicks" Mobility   Outcome Measure  Help needed turning from your back to your side while in a flat bed without using bedrails?: Total Help needed moving from lying on your back to sitting on the side of a  flat bed without using bedrails?: Total Help needed moving to and from a bed to a chair (including a wheelchair)?: Total Help needed standing up from a chair using your arms (e.g., wheelchair or bedside chair)?: Total Help needed to walk in hospital room?: Total Help needed climbing 3-5 steps with a railing? : Total 6 Click Score: 6    End of Session Equipment Utilized During Treatment: Gait belt Activity Tolerance: Patient tolerated treatment well Patient left: in bed;with call bell/phone within reach;with restraints reapplied;with bed alarm set(hand mittens in place) Nurse Communication: Mobility status PT Visit Diagnosis: Muscle weakness (generalized) (M62.81);Other abnormalities of gait and mobility (R26.89);Difficulty in walking, not elsewhere classified (R26.2)     Time: 1235-1300 PT Time Calculation (min) (ACUTE ONLY): 25 min  Charges:  $Therapeutic Activity: 8-22 mins                     Kipp Brood, PT, DPT, [facility] Physical Therapist with [facility]  09/10/2018 2:54 PM

## 2018-09-10 NOTE — Progress Notes (Signed)
Pt discharged to facility, EMS arrived to transport pt. Pt stable. SRP, RN

## 2018-09-10 NOTE — Discharge Summary (Addendum)
Discharge Summary  Marvin Wagner:[id number] DOB: Apr 07, 1930  PCP: Eulas Post, MD  Admit date: 09/01/2018 Discharge date: 09/10/2018  Time spent: 35 minutes  Recommendations for Outpatient Follow-up:  1. Follow-up with your primary care provider 2. Take your medications as prescribed 3. Continue physical therapy as tolerated 4. Fall precautions  Discharge Diagnoses:  Active Hospital Problems   Diagnosis Date Noted   Sepsis (Marvin Wagner) 09/02/2018   Cellulitis 09/02/2018   Dementia (Marvin Wagner) 03/04/2015   Hx of CABG 07/01/2010    Resolved Hospital Problems  No resolved problems to display.    Discharge Condition: Stable  Diet recommendation: Resume previous diet  Vitals:   09/09/18 2105 09/10/18 0500  BP: (!) 132/59 (!) 112/58  Pulse: 89 84  Resp: 18 18  Temp: 98 F (36.7 C) 97.9 F (36.6 C)  SpO2: 99% 100%    History of present illness:  Marvin Wagner a87 y.o.male,w CAD s/p CABG, h/o Aortic stenosis s/p AVR, chronic A. fib not on anticoagulation due to fall risk,dementia, who presented from memory care facility with fever x2 days.  Apparently fell sometimes last week. Right wrist slightly swollen no fracture per x-ray report.   In ED,  T 100.5, P 129 R 23, Bp 144/87 Pox 91% on RA Wt 77.1, kg  R wrist IMPRESSION: 1. No acute osseous abnormality. 2. Mild diffuse soft tissue swelling about the visualized right hand and wrist. 3. Moderate degenerative osteoarthritic changes, most notable at the first [facility] joint.  CXR IMPRESSION: No active disease.  Xray Hip/ pelvis IMPRESSION: 1. No acute osseous abnormality. 2. Mild osteoarthritic changes about the hips bilaterally. 3. Large volume retained stool impacted within the rectal vault, suggesting constipation.  CT brain IMPRESSION: 1. No acute intracranial abnormality. Advanced chronic small vessel ischemic disease and brain atrophy. 2. No evidence for cervical spine fracture. Multi level  cervical spondylosis noted.  covid -19 negative  Urinalysis rbc 11-20, wbc 0-5  Na 133, K 4.0, Bun 20, Creatinine 0.86 Ast 20, Alt 13 Wbc 15.2, Hgb 13.8, Plt 184  INR 1.1  Admitted for sepsis 2/2 pneumonia vs cellulitis of R wrist. Improved on antibiotics. Cellulitis and pneumonia resolved.  09/09/18: Patient was seen and examined at his bedside this morning no acute events overnight.  Afebrile in the last 24 hours.  Procalcitonin less than 0.10, CBC with differential unremarkable, no sign of active infective process.  09/10/18: Patient was seen and examined at his bedside this morning.  No acute events overnight.  Patient has no new complaints.  He is alert in the setting of advanced dementia.  Vital signs and labs reviewed and are stable.  Patient is medically stable for discharge to SNF.    Hospital Course:  Principal Problem:   Sepsis ([place]) Active Problems:   Hx of CABG   Dementia ([facility])   Cellulitis  Resolved sepsis secondary to HCAP versus right wrist cellulitis Presented with leukocytosis and fever Right wrist cellulitis is resolved Afebrile in the last 24 hours. CBC with differential unremarkable, no sign of active infective process. Procalcitonin less than 0.1 on 09/08/2018. Completed 5 days of IV Rocephin P.o. doxycycline to end on 09/10/18, completed course. Follow-up with your PCP  Resolved acute metabolic encephalopathy, likely in the setting of sepsis. Patient has a baseline advanced dementia Currently no behavioral disturbance  Paroxysmal A. fib with RVR, RVR has resolved Currently rate controlled on Toprol-XL Vital signs on 09/09/2018: Blood pressure 140/88, heart rate 78, respiration rate 20, O2 sat 99% on room  air. Not on anticoagulation due to fall risk  Resolved prolonged QTC Last twelve-lead EKG done on 09/08/2018 QTC 472 Continue to avoid QTC prolonging agents  Coagulase-negative bacteremia, likely a contaminant Isolated 1 out of 2  bottles, likely contaminant Vital signs and labs reviewed and no sign of active infective process Afebrile, no leukocytosis Procalcitonin less than 0.10  History of COVID-19 infection Repeated COVID-19 testing negative x2 Afebrile in the last 24 hours O2 saturation 99% on room air on 09/09/2018  Coronary artery disease status post CABG, aortic stenosis status post AVR Continue aspirin 81 mg daily Continue Toprol-XL 37.5 mg daily  Physical debility PT assessed and recommended SNF CSW consulted for placement Continue physical therapy as tolerated Fall precautions.   Code Status:DNR  Consultants:  none  Procedures:  none    Discharge Exam: BP (!) 112/58 (BP Location: Left Arm)    Pulse 84    Temp 97.9 F (36.6 C) (Axillary)    Resp 18    Ht 6' (1.829 m)    Wt 74.9 kg    SpO2 100%    BMI 22.39 kg/m   General: 85 y.o. year-old male well-developed well-nourished in no acute distress.  Alert in the setting of advanced dementia.  Cardiovascular: Regular rate and rhythm no rubs or gallops.  No JVD or thyromegaly noted.  Respiratory: Clear to auscultation.  No wheezes or rales.  Poor inspiratory effort.  Abdomen: Soft nontender nondistended with normal bowel sounds present.   Musculoskeletal: Trace lower extremity edema from ankle down bilaterally.  2 out of 4 pulses in all 4 extremities.    Psychiatry: Mood is appropriate for condition and setting.  Discharge Instructions You were cared for by a hospitalist during your hospital stay. If you have any questions about your discharge medications or the care you received while you were in the hospital after you are discharged, you can call the unit and asked to speak with the hospitalist on call if the hospitalist that took care of you is not available. Once you are discharged, your primary care physician will handle any further medical issues. Please note that NO REFILLS for any discharge medications will be authorized  once you are discharged, as it is imperative that you return to your primary care physician (or establish a relationship with a primary care physician if you do not have one) for your aftercare needs so that they can reassess your need for medications and monitor your lab values.   Allergies as of 09/10/2018      Reactions   Ativan [lorazepam]    Penicillins Other (See Comments)   Has patient had a PCN reaction causing immediate rash, facial/tongue/throat swelling, SOB or lightheadedness with hypotension: Unknown Has patient had a PCN reaction causing severe rash involving mucus membranes or skin necrosis: Unknown Has patient had a PCN reaction that required hospitalization: Unknown Has patient had a PCN reaction occurring within the last 10 years: Unknown If all of the above answers are "NO", then may proceed with Cephalosporin use.      Medication List    STOP taking these medications   acetaminophen 325 MG tablet Commonly known as: TYLENOL   atorvastatin 40 MG tablet Commonly known as: LIPITOR   donepezil 10 MG tablet Commonly known as: ARICEPT   memantine 10 MG tablet Commonly known as: NAMENDA   metoprolol tartrate 25 MG tablet Commonly known as: LOPRESSOR   ondansetron 8 MG disintegrating tablet Commonly known as: Zofran ODT  TAKE these medications   aspirin EC 81 MG tablet Take 81 mg by mouth daily.   [employer] Tussin DM 10-100 MG/5ML liquid Generic drug: dextromethorphan-guaiFENesin Take 10 mLs by mouth every 6 (six) hours as needed for cough or congestion.   doxycycline 100 MG tablet Commonly known as: VIBRA-TABS Take 1 tablet (100 mg total) by mouth every 12 (twelve) hours for 5 days.   Melatonin 3 MG Tabs Take 3 mg by mouth at bedtime.   metoprolol succinate 25 MG 24 hr tablet Commonly known as: TOPROL-XL Take 1.5 tablets (37.5 mg total) by mouth daily. What changed: how much to take   pantoprazole 40 MG tablet Commonly known as: PROTONIX Take 1 tablet  (40 mg total) by mouth daily.   risperiDONE 0.25 MG tablet Commonly known as: RisperDAL Take 1 tablet (0.25 mg total) by mouth 2 (two) times daily as needed (anxiety). What changed: when to take this      Allergies  Allergen Reactions   Ativan [Lorazepam]    Penicillins Other (See Comments)    Has patient had a PCN reaction causing immediate rash, facial/tongue/throat swelling, SOB or lightheadedness with hypotension: Unknown Has patient had a PCN reaction causing severe rash involving mucus membranes or skin necrosis: Unknown Has patient had a PCN reaction that required hospitalization: Unknown Has patient had a PCN reaction occurring within the last 10 years: Unknown If all of the above answers are "NO", then may proceed with Cephalosporin use.    Follow-up Information    Eulas Post, MD. Call in 1 day(s).   Specialty: Family Medicine Why: Please call for a post hospital follow-up appointment. Contact information: [place]  [id number] [phone]            The results of significant diagnostics from this hospitalization (including imaging, microbiology, ancillary and laboratory) are listed below for reference.    Significant Diagnostic Studies: Dg Wrist Complete Right  Result Date: 09/01/2018 CLINICAL DATA:  Initial evaluation for acute warmth and swelling about the right wrist. History of recent fall. EXAM: RIGHT WRIST - COMPLETE 3+ VIEW COMPARISON:  None. FINDINGS: No acute fracture or dislocation. Normal radiocarpal and distal radioulnar articulations maintained. Mild degenerative changes with chondrocalcinosis noted about the wrist. Prominent osteoarthritic changes noted at the first [facility] joint. Mild diffuse soft tissue swelling about the visualized right hand and wrist. IMPRESSION: 1. No acute osseous abnormality. 2. Mild diffuse soft tissue swelling about the visualized right hand and wrist. 3. Moderate degenerative osteoarthritic  changes, most notable at the first [facility] joint. Electronically Signed   By: Jeannine Boga M.D.   On: 09/01/2018 22:57   Ct Head Wo Contrast  Result Date: 09/01/2018 CLINICAL DATA:  Fevers.  Dementia.  Head trauma. EXAM: CT HEAD WITHOUT CONTRAST CT CERVICAL SPINE WITHOUT CONTRAST TECHNIQUE: Multidetector CT imaging of the head and cervical spine was performed following the standard protocol without intravenous contrast. Multiplanar CT image reconstructions of the cervical spine were also generated. COMPARISON:  06/18/2018 FINDINGS: CT HEAD FINDINGS Brain: No evidence of acute infarction, hemorrhage, hydrocephalus, extra-axial collection or mass lesion/mass effect. There is mild diffuse low-attenuation within the subcortical and periventricular white matter compatible with chronic microvascular disease. Prominence of sulci and ventricles compatible with brain atrophy. Vascular: No hyperdense vessel or unexpected calcification. Skull: Normal. Negative for fracture or focal lesion. Sinuses/Orbits: No acute finding. Other: None CT CERVICAL SPINE FINDINGS Alignment: Normal. Skull base and vertebrae: No acute fracture. No primary bone lesion or focal pathologic  process. Soft tissues and spinal canal: No prevertebral fluid or swelling. No visible canal hematoma. Disc levels: Disc space narrowing in endplate spurring identified at C5-6, C6-7 and C7-T1. Upper chest: Negative. Other: None IMPRESSION: 1. No acute intracranial abnormality. Advanced chronic small vessel ischemic disease and brain atrophy. 2. No evidence for cervical spine fracture. Multi level cervical spondylosis noted. Electronically Signed   By: Kerby Moors M.D.   On: 09/01/2018 23:19   Ct Angio Chest Pe W Or Wo Contrast  Result Date: 09/02/2018 CLINICAL DATA:  Fever, dementia, mental status change, recent fall, history of pneumonia and COVID-19 EXAM: CT ANGIOGRAPHY CHEST WITH CONTRAST TECHNIQUE: Multidetector CT imaging of the chest was  performed using the standard protocol during bolus administration of intravenous contrast. Multiplanar CT image reconstructions and MIPs were obtained to evaluate the vascular anatomy. CONTRAST:  135mL OMNIPAQUE IOHEXOL 350 MG/ML SOLN COMPARISON:  02/24/2016 FINDINGS: Cardiovascular: Heart size normal. No pericardial effusion. Mildly dilated right pulmonary artery. Satisfactory opacification of pulmonary arteries noted, and there is no evidence of pulmonary emboli. Previous AVR. Scattered coronary calcifications, post CABG. Adequate contrast opacification of the thoracic aorta with no evidence of dissection, aneurysm, or stenosis. There is classic 3-vessel brachiocephalic arch anatomy without proximal stenosis. Coarse moderate atheromatous calcifications in the distal arch and descending thoracic segment. Mild atheromatous change in the proximal visualized abdominal aorta without dilatation. Mediastinum/Nodes: Small hiatal hernia. No pleural or pericardial effusion. Lungs/Pleura: Trace pleural effusions left greater than right. No pneumothorax. Coarse linear opacities peripherally in both upper lobes. Poorly marginated ground-glass opacity centrally in both upper lobes, and left perihilar region. No airspace consolidation. Upper Abdomen: No acute findings. Musculoskeletal: Chest wall hematoma seen previously has resolved. Sternotomy wires. No acute fracture or worrisome bone lesion. Review of the MIP images confirms the above findings. IMPRESSION: 1. Negative for acute PE or thoracic aortic dissection. 2. Trace bilateral pleural effusions left greater than right. 3. Patchy ground-glass opacities in both upper lobes and left perihilar region, possibly infectious/inflammatory. 4. Coronary and Aortic Atherosclerosis (ICD10-I70.0), post AVR and CABG. Electronically Signed   By: Lucrezia Europe M.D.   On: 09/02/2018 14:13   Ct Cervical Spine Wo Contrast  Result Date: 09/01/2018 CLINICAL DATA:  Fevers.  Dementia.  Head  trauma. EXAM: CT HEAD WITHOUT CONTRAST CT CERVICAL SPINE WITHOUT CONTRAST TECHNIQUE: Multidetector CT imaging of the head and cervical spine was performed following the standard protocol without intravenous contrast. Multiplanar CT image reconstructions of the cervical spine were also generated. COMPARISON:  06/18/2018 FINDINGS: CT HEAD FINDINGS Brain: No evidence of acute infarction, hemorrhage, hydrocephalus, extra-axial collection or mass lesion/mass effect. There is mild diffuse low-attenuation within the subcortical and periventricular white matter compatible with chronic microvascular disease. Prominence of sulci and ventricles compatible with brain atrophy. Vascular: No hyperdense vessel or unexpected calcification. Skull: Normal. Negative for fracture or focal lesion. Sinuses/Orbits: No acute finding. Other: None CT CERVICAL SPINE FINDINGS Alignment: Normal. Skull base and vertebrae: No acute fracture. No primary bone lesion or focal pathologic process. Soft tissues and spinal canal: No prevertebral fluid or swelling. No visible canal hematoma. Disc levels: Disc space narrowing in endplate spurring identified at C5-6, C6-7 and C7-T1. Upper chest: Negative. Other: None IMPRESSION: 1. No acute intracranial abnormality. Advanced chronic small vessel ischemic disease and brain atrophy. 2. No evidence for cervical spine fracture. Multi level cervical spondylosis noted. Electronically Signed   By: Kerby Moors M.D.   On: 09/01/2018 23:19   Ct Wrist Right W Contrast  Result  Date: 09/02/2018 CLINICAL DATA:  [place].  Wrist pain. EXAM: CT OF THE UPPER RIGHT EXTREMITY WITH CONTRAST TECHNIQUE: Multidetector CT imaging of the upper right extremity was performed according to the standard protocol following intravenous contrast administration. COMPARISON:  Wrist radiographs 09/01/2018 CONTRAST:  137mL OMNIPAQUE IOHEXOL 350 MG/ML SOLN FINDINGS: The joint spaces are fairly well maintained for the patient's age. Mild  degenerative changes. No acute wrist fractures identified. Carpal alignment is normal. Advanced degenerative changes at the [facility] joint of the thumb but no acute fracture. The visualized metacarpal bones are intact. No fracture of the distal radius or ulna. Slight widening of the scapholunate joint space could suggest a ligament tear. Diffuse subcutaneous soft tissue swelling/edema could be posttraumatic or cellulitis. IMPRESSION: 1. No definite acute wrist fracture. 2. Advanced degenerative changes at the [facility] joint of the thumb. 3. Widening of the scapholunate joint space could suggest a ligament tear or ligament insufficiency. 4. Diffuse subcutaneous soft tissue swelling. Electronically Signed   By: Marijo Sanes M.D.   On: 09/02/2018 13:49   Dg Chest Port 1 View  Result Date: 09/01/2018 CLINICAL DATA:  Shortness of breath EXAM: PORTABLE CHEST 1 VIEW COMPARISON:  06/18/2018 FINDINGS: Prior median sternotomy and valve replacement. Mild cardiomegaly. Aortic atherosclerosis. No confluent opacity, effusion or edema. No acute bony abnormality. IMPRESSION: No active disease. Electronically Signed   By: Rolm Baptise M.D.   On: 09/01/2018 22:54   Dg Hips Bilat W Or Wo Pelvis 3-4 Views  Result Date: 09/01/2018 CLINICAL DATA:  Initial evaluation for recent trauma, fall. EXAM: DG HIP (WITH OR WITHOUT PELVIS) 3-4V BILAT COMPARISON:  None available. FINDINGS: No acute fracture dislocation. Femoral heads in normal alignment within the acetabula. Femoral head heights maintained. Bony pelvis intact. Mild osteoarthritic changes present about the hips bilaterally. Degenerative changes noted within the lower lumbar spine. No definite acute soft tissue abnormality. Large volume retained stool seen impacted within the rectal vault. IMPRESSION: 1. No acute osseous abnormality. 2. Mild osteoarthritic changes about the hips bilaterally. 3. Large volume retained stool impacted within the rectal vault, suggesting constipation.  Electronically Signed   By: Jeannine Boga M.D.   On: 09/01/2018 23:00    Microbiology: Recent Results (from the past 240 hour(s))  Blood Culture (routine x 2)     Status: Abnormal   Collection Time: 09/01/18  9:29 PM   Specimen: BLOOD  Result Value Ref Range Status   Specimen Description   Final    BLOOD LEFT ANTECUBITAL Performed at [place] [address] [place], [place] [address]    Special Requests   Final    BOTTLES DRAWN AEROBIC AND ANAEROBIC Blood Culture results may not be optimal due to an excessive volume of blood received in culture bottles Performed at [place] [address] [place], [place] [address]    Culture  Setup Time   Final    GRAM POSITIVE COCCI IN CLUSTERS AEROBIC BOTTLE ONLY CRITICAL RESULT CALLED TO, READ BACK BY AND VERIFIED WITH: J GRIMSLEY,PHARMD AT 2240 09/02/2018 BY L BENFIELD    Culture (A)  Final    STAPHYLOCOCCUS SPECIES (COAGULASE NEGATIVE) THE SIGNIFICANCE OF ISOLATING THIS ORGANISM FROM A SINGLE SET OF BLOOD CULTURES WHEN MULTIPLE SETS ARE DRAWN IS UNCERTAIN. PLEASE NOTIFY THE MICROBIOLOGY DEPARTMENT WITHIN ONE WEEK IF SPECIATION AND SENSITIVITIES ARE REQUIRED. Performed at [facility], [place] [address] [place], [place] [address]    Report Status 09/04/2018 FINAL  Final  Urine culture  Status: None   Collection Time: 09/01/18  9:29 PM   Specimen: In/Out Cath Urine  Result Value Ref Range Status   Specimen Description   Final    IN/OUT CATH URINE Performed at [facility], [place] [address] [place], [place] [address]    Special Requests   Final    NONE Performed at [facility], [place] [address] [place], [place] [address]    Culture   Final    NO GROWTH Performed at [facility], [place] [address] [place], [place] [address]    Report Status 09/03/2018 FINAL  Final  Blood Culture ID Panel (Reflexed)     Status: Abnormal   Collection  Time: 09/01/18  9:29 PM  Result Value Ref Range Status   Enterococcus species NOT DETECTED NOT DETECTED Final   Listeria monocytogenes NOT DETECTED NOT DETECTED Final   Staphylococcus species DETECTED (A) NOT DETECTED Final    Comment: Methicillin (oxacillin) resistant coagulase negative staphylococcus. Possible blood culture contaminant (unless isolated from more than one blood culture draw or clinical case suggests pathogenicity). No antibiotic treatment is indicated for blood  culture contaminants. CRITICAL RESULT CALLED TO, READ BACK BY AND VERIFIED WITH: J GRIMSLEY,PHARMD AT 2040 09/02/2018 BY L BENFIELD    Staphylococcus aureus (BCID) NOT DETECTED NOT DETECTED Final   Methicillin resistance DETECTED (A) NOT DETECTED Final    Comment: CRITICAL RESULT CALLED TO, READ BACK BY AND VERIFIED WITH: J GRIMSLEY,PHARMD AT 2040 09/02/2018 BY L BENFIELD    Streptococcus species NOT DETECTED NOT DETECTED Final   Streptococcus agalactiae NOT DETECTED NOT DETECTED Final   Streptococcus pneumoniae NOT DETECTED NOT DETECTED Final   Streptococcus pyogenes NOT DETECTED NOT DETECTED Final   Acinetobacter baumannii NOT DETECTED NOT DETECTED Final   Enterobacteriaceae species NOT DETECTED NOT DETECTED Final   Enterobacter cloacae complex NOT DETECTED NOT DETECTED Final   Escherichia coli NOT DETECTED NOT DETECTED Final   Klebsiella oxytoca NOT DETECTED NOT DETECTED Final   Klebsiella pneumoniae NOT DETECTED NOT DETECTED Final   Proteus species NOT DETECTED NOT DETECTED Final   Serratia marcescens NOT DETECTED NOT DETECTED Final   Haemophilus influenzae NOT DETECTED NOT DETECTED Final   Neisseria meningitidis NOT DETECTED NOT DETECTED Final   Pseudomonas aeruginosa NOT DETECTED NOT DETECTED Final   Candida albicans NOT DETECTED NOT DETECTED Final   Candida glabrata NOT DETECTED NOT DETECTED Final   Candida krusei NOT DETECTED NOT DETECTED Final   Candida parapsilosis NOT DETECTED NOT DETECTED Final    Candida tropicalis NOT DETECTED NOT DETECTED Final    Comment: Performed at [facility], [place]. [address] [place], [place] [address]  Blood Culture (routine x 2)     Status: None   Collection Time: 09/01/18  9:33 PM   Specimen: BLOOD RIGHT FOREARM  Result Value Ref Range Status   Specimen Description   Final    BLOOD RIGHT FOREARM Performed at [facility], [place] [address] [place], [place] [address]    Special Requests   Final    BOTTLES DRAWN AEROBIC AND ANAEROBIC Blood Culture results may not be optimal due to an excessive volume of blood received in culture bottles Performed at [place] [address] [place], [place] [address]    Culture   Final    NO GROWTH 5 DAYS Performed at [facility], [place] [address] [place],  [address]    Report Status 09/06/2018 FINAL  Final  SARS Coronavirus 2 [facility] order, Performed in [facility] hospital lab) Nasopharyngeal Nasopharyngeal Swab     Status: None   Collection Time: 09/01/18  9:33 PM   Specimen: Nasopharyngeal Swab  Result Value Ref Range Status   SARS Coronavirus 2 NEGATIVE NEGATIVE Final    Comment: (NOTE) If result is NEGATIVE SARS-CoV-2 target nucleic acids are NOT DETECTED. The SARS-CoV-2 RNA is generally detectable in upper and lower  respiratory specimens during the acute phase of infection. The lowest  concentration of SARS-CoV-2 viral copies this assay can detect is 250  copies / mL. A negative result does not preclude SARS-CoV-2 infection  and should not be used as the sole basis for treatment or other  patient management decisions.  A negative result may occur with  improper specimen collection / handling, submission of specimen other  than nasopharyngeal swab, presence of viral mutation(s) within the  areas targeted by this assay, and inadequate number of viral copies  (<250 copies / mL). A negative result must be combined with clinical  observations, patient history,  and epidemiological information. If result is POSITIVE SARS-CoV-2 target nucleic acids are DETECTED. The SARS-CoV-2 RNA is generally detectable in upper and lower  respiratory specimens dur ing the acute phase of infection.  Positive  results are indicative of active infection with SARS-CoV-2.  Clinical  correlation with patient history and other diagnostic information is  necessary to determine patient infection status.  Positive results do  not rule out bacterial infection or co-infection with other viruses. If result is PRESUMPTIVE POSTIVE SARS-CoV-2 nucleic acids MAY BE PRESENT.   A presumptive positive result was obtained on the submitted specimen  and confirmed on repeat testing.  While 2019 novel coronavirus  (SARS-CoV-2) nucleic acids may be present in the submitted sample  additional confirmatory testing may be necessary for epidemiological  and / or clinical management purposes  to differentiate between  SARS-CoV-2 and other Sarbecovirus currently known to infect humans.  If clinically indicated additional testing with an alternate test  methodology [phone]) is advised. The SARS-CoV-2 RNA is generally  detectable in upper and lower respiratory sp ecimens during the acute  phase of infection. The expected result is Negative. Fact Sheet for Patients:  StrictlyIdeas.no Fact Sheet for Healthcare Providers: BankingDealers.co.za This test is not yet approved or cleared by the [place] FDA and has been authorized for detection and/or diagnosis of SARS-CoV-2 by FDA under an Emergency Use Authorization (EUA).  This EUA will remain in effect (meaning this test can be used) for the duration of the COVID-19 declaration under Section 564(b)(1) of the Act, 21 U.S.C. section 360bbb-3(b)(1), unless the authorization is terminated or revoked sooner. Performed at [facility], [place] [address] [place], [place]  [address]   Respiratory Panel by PCR     Status: None   Collection Time: 09/02/18  2:45 PM   Specimen: Nasopharyngeal Swab; Respiratory  Result Value Ref Range Status   Adenovirus NOT DETECTED NOT DETECTED Final   Coronavirus 229E NOT DETECTED NOT DETECTED Final    Comment: (NOTE) The Coronavirus on the Respiratory Panel, DOES NOT test for the novel  Coronavirus (2019 nCoV)    Coronavirus HKU1 NOT DETECTED NOT DETECTED Final   Coronavirus NL63 NOT DETECTED NOT DETECTED Final   Coronavirus OC43 NOT DETECTED NOT DETECTED Final   Metapneumovirus NOT DETECTED NOT DETECTED Final   Rhinovirus / Enterovirus NOT DETECTED NOT DETECTED Final   Influenza A NOT DETECTED NOT DETECTED Final   Influenza  B NOT DETECTED NOT DETECTED Final   Parainfluenza Virus 1 NOT DETECTED NOT DETECTED Final   Parainfluenza Virus 2 NOT DETECTED NOT DETECTED Final   Parainfluenza Virus 3 NOT DETECTED NOT DETECTED Final   Parainfluenza Virus 4 NOT DETECTED NOT DETECTED Final   Respiratory Syncytial Virus NOT DETECTED NOT DETECTED Final   Bordetella pertussis NOT DETECTED NOT DETECTED Final   Chlamydophila pneumoniae NOT DETECTED NOT DETECTED Final   Mycoplasma pneumoniae NOT DETECTED NOT DETECTED Final    Comment: Performed at [facility], [place] [address], [place], [place] [address]  MRSA PCR Screening     Status: None   Collection Time: 09/02/18  2:45 PM   Specimen: Nasopharyngeal  Result Value Ref Range Status   MRSA by PCR NEGATIVE NEGATIVE Final    Comment:        The GeneXpert MRSA Assay (FDA approved for NASAL specimens only), is one component of a comprehensive MRSA colonization surveillance program. It is not intended to diagnose MRSA infection nor to guide or monitor treatment for MRSA infections. Performed at [facility], [place] [address] [place], [place] [address]   SARS Coronavirus 2 [facility] order, Performed in [facility] hospital lab)     Status: None   Collection  Time: 09/02/18  6:04 PM  Result Value Ref Range Status   SARS Coronavirus 2 NEGATIVE NEGATIVE Final    Comment: (NOTE) If result is NEGATIVE SARS-CoV-2 target nucleic acids are NOT DETECTED. The SARS-CoV-2 RNA is generally detectable in upper and lower  respiratory specimens during the acute phase of infection. The lowest  concentration of SARS-CoV-2 viral copies this assay can detect is 250  copies / mL. A negative result does not preclude SARS-CoV-2 infection  and should not be used as the sole basis for treatment or other  patient management decisions.  A negative result may occur with  improper specimen collection / handling, submission of specimen other  than nasopharyngeal swab, presence of viral mutation(s) within the  areas targeted by this assay, and inadequate number of viral copies  (<250 copies / mL). A negative result must be combined with clinical  observations, patient history, and epidemiological information. If result is POSITIVE SARS-CoV-2 target nucleic acids are DETECTED. The SARS-CoV-2 RNA is generally detectable in upper and lower  respiratory specimens dur ing the acute phase of infection.  Positive  results are indicative of active infection with SARS-CoV-2.  Clinical  correlation with patient history and other diagnostic information is  necessary to determine patient infection status.  Positive results do  not rule out bacterial infection or co-infection with other viruses. If result is PRESUMPTIVE POSTIVE SARS-CoV-2 nucleic acids MAY BE PRESENT.   A presumptive positive result was obtained on the submitted specimen  and confirmed on repeat testing.  While 2019 novel coronavirus  (SARS-CoV-2) nucleic acids may be present in the submitted sample  additional confirmatory testing may be necessary for epidemiological  and / or clinical management purposes  to differentiate between  SARS-CoV-2 and other Sarbecovirus currently known to infect humans.  If  clinically indicated additional testing with an alternate test  methodology [phone]) is advised. The SARS-CoV-2 RNA is generally  detectable in upper and lower respiratory sp ecimens during the acute  phase of infection. The expected result is Negative. Fact Sheet for Patients:  StrictlyIdeas.no Fact Sheet for Healthcare Providers: BankingDealers.co.za This test is not yet approved or cleared by the [place] FDA and has been authorized for detection  and/or diagnosis of SARS-CoV-2 by FDA under an Emergency Use Authorization (EUA).  This EUA will remain in effect (meaning this test can be used) for the duration of the COVID-19 declaration under Section 564(b)(1) of the Act, 21 U.S.C. section 360bbb-3(b)(1), unless the authorization is terminated or revoked sooner. Performed at [facility], [place] [address], [place], [place] [address]   SARS CORONAVIRUS 2     Status: None   Collection Time: 09/09/18 12:28 PM  Result Value Ref Range Status   SARS Coronavirus 2 NEGATIVE NEGATIVE Final    Comment: (NOTE) SARS-CoV-2 target nucleic acids are NOT DETECTED. The SARS-CoV-2 RNA is generally detectable in upper and lower respiratory specimens during the acute phase of infection. Negative results do not preclude SARS-CoV-2 infection, do not rule out co-infections with other pathogens, and should not be used as the sole basis for treatment or other patient management decisions. Negative results must be combined with clinical observations, patient history, and epidemiological information. The expected result is Negative. Fact Sheet for Patients: SugarRoll.be Fact Sheet for Healthcare Providers: https://www.woods-mathews.com/ This test is not yet approved or cleared by the [place] FDA and  has been authorized for detection and/or diagnosis of SARS-CoV-2 by FDA under an Emergency  Use Authorization (EUA). This EUA will remain  in effect (meaning this test can be used) for the duration of the COVID-19 declaration under Section 56 4(b)(1) of the Act, 21 U.S.C. section 360bbb-3(b)(1), unless the authorization is terminated or revoked sooner. Performed at [facility], [place] [address] [place], [place] [address]      Labs: Basic Metabolic Panel: Recent Labs  Lab 09/04/18 0357 09/05/18 0416 09/07/18 0611 09/09/18 0211  NA 138 139 141  --   K 3.8 4.2 3.6  --   CL 107 105 107  --   CO2 23 22 25   --   GLUCOSE 107* 106* 117*  --   BUN 22 25* 24*  --   CREATININE 0.85 0.81 0.78 0.78  CALCIUM 7.8* 8.0* 8.4*  --   MG 2.5* 2.6*  --   --    Liver Function Tests: Recent Labs  Lab 09/04/18 0357 09/05/18 0416  AST 19 26  ALT 11 15  ALKPHOS 50 60  BILITOT 0.7 1.2  PROT 5.4* 6.1*  ALBUMIN 2.3* 2.5*   No results for input(s): LIPASE, AMYLASE in the last 168 hours. No results for input(s): AMMONIA in the last 168 hours. CBC: Recent Labs  Lab 09/04/18 0357 09/05/18 0416 09/08/18 0622  WBC 8.6 9.2 8.3  NEUTROABS  --   --  5.5  HGB 10.2* 10.8* 10.8*  HCT 30.5* 32.4* 34.0*  MCV 99.0 98.5 100.6*  PLT 201 235 296   Cardiac Enzymes: No results for input(s): CKTOTAL, CKMB, CKMBINDEX, TROPONINI in the last 168 hours. BNP: BNP (last 3 results) No results for input(s): BNP in the last 8760 hours.  ProBNP (last 3 results) No results for input(s): PROBNP in the last 8760 hours.  CBG: No results for input(s): GLUCAP in the last 168 hours.     Signed:  Kayleen Memos, MD [facility] 09/10/2018, 11:13 AM

## 2018-09-11 DIAGNOSIS — R2681 Unsteadiness on feet: Secondary | ICD-10-CM | POA: Diagnosis not present

## 2018-09-11 DIAGNOSIS — R0602 Shortness of breath: Secondary | ICD-10-CM | POA: Diagnosis not present

## 2018-09-11 DIAGNOSIS — M6281 Muscle weakness (generalized): Secondary | ICD-10-CM | POA: Diagnosis not present

## 2018-09-11 DIAGNOSIS — R488 Other symbolic dysfunctions: Secondary | ICD-10-CM | POA: Diagnosis not present

## 2018-09-11 DIAGNOSIS — R262 Difficulty in walking, not elsewhere classified: Secondary | ICD-10-CM | POA: Diagnosis not present

## 2018-09-12 DIAGNOSIS — R488 Other symbolic dysfunctions: Secondary | ICD-10-CM | POA: Diagnosis not present

## 2018-09-12 DIAGNOSIS — R2681 Unsteadiness on feet: Secondary | ICD-10-CM | POA: Diagnosis not present

## 2018-09-12 DIAGNOSIS — M6281 Muscle weakness (generalized): Secondary | ICD-10-CM | POA: Diagnosis not present

## 2018-09-12 DIAGNOSIS — R0602 Shortness of breath: Secondary | ICD-10-CM | POA: Diagnosis not present

## 2018-09-12 DIAGNOSIS — R262 Difficulty in walking, not elsewhere classified: Secondary | ICD-10-CM | POA: Diagnosis not present

## 2018-09-13 DIAGNOSIS — R262 Difficulty in walking, not elsewhere classified: Secondary | ICD-10-CM | POA: Diagnosis not present

## 2018-09-13 DIAGNOSIS — R2681 Unsteadiness on feet: Secondary | ICD-10-CM | POA: Diagnosis not present

## 2018-09-13 DIAGNOSIS — M6281 Muscle weakness (generalized): Secondary | ICD-10-CM | POA: Diagnosis not present

## 2018-09-13 DIAGNOSIS — R488 Other symbolic dysfunctions: Secondary | ICD-10-CM | POA: Diagnosis not present

## 2018-09-13 DIAGNOSIS — R0602 Shortness of breath: Secondary | ICD-10-CM | POA: Diagnosis not present

## 2018-09-14 DIAGNOSIS — I2510 Atherosclerotic heart disease of native coronary artery without angina pectoris: Secondary | ICD-10-CM | POA: Diagnosis not present

## 2018-09-14 DIAGNOSIS — K219 Gastro-esophageal reflux disease without esophagitis: Secondary | ICD-10-CM | POA: Diagnosis not present

## 2018-09-14 DIAGNOSIS — I4891 Unspecified atrial fibrillation: Secondary | ICD-10-CM | POA: Diagnosis not present

## 2018-09-14 DIAGNOSIS — R0602 Shortness of breath: Secondary | ICD-10-CM | POA: Diagnosis not present

## 2018-09-14 DIAGNOSIS — D649 Anemia, unspecified: Secondary | ICD-10-CM | POA: Diagnosis not present

## 2018-09-14 DIAGNOSIS — I358 Other nonrheumatic aortic valve disorders: Secondary | ICD-10-CM | POA: Diagnosis not present

## 2018-09-14 DIAGNOSIS — R262 Difficulty in walking, not elsewhere classified: Secondary | ICD-10-CM | POA: Diagnosis not present

## 2018-09-14 DIAGNOSIS — R488 Other symbolic dysfunctions: Secondary | ICD-10-CM | POA: Diagnosis not present

## 2018-09-14 DIAGNOSIS — G309 Alzheimer's disease, unspecified: Secondary | ICD-10-CM | POA: Diagnosis not present

## 2018-09-14 DIAGNOSIS — Z8619 Personal history of other infectious and parasitic diseases: Secondary | ICD-10-CM | POA: Diagnosis not present

## 2018-09-14 DIAGNOSIS — L03113 Cellulitis of right upper limb: Secondary | ICD-10-CM | POA: Diagnosis not present

## 2018-09-14 DIAGNOSIS — E785 Hyperlipidemia, unspecified: Secondary | ICD-10-CM | POA: Diagnosis not present

## 2018-09-14 DIAGNOSIS — E46 Unspecified protein-calorie malnutrition: Secondary | ICD-10-CM | POA: Diagnosis not present

## 2018-09-14 DIAGNOSIS — F0281 Dementia in other diseases classified elsewhere with behavioral disturbance: Secondary | ICD-10-CM | POA: Diagnosis not present

## 2018-09-14 DIAGNOSIS — M6281 Muscle weakness (generalized): Secondary | ICD-10-CM | POA: Diagnosis not present

## 2018-09-14 DIAGNOSIS — R2681 Unsteadiness on feet: Secondary | ICD-10-CM | POA: Diagnosis not present

## 2018-09-15 DIAGNOSIS — F0281 Dementia in other diseases classified elsewhere with behavioral disturbance: Secondary | ICD-10-CM | POA: Diagnosis not present

## 2018-09-15 DIAGNOSIS — I358 Other nonrheumatic aortic valve disorders: Secondary | ICD-10-CM | POA: Diagnosis not present

## 2018-09-15 DIAGNOSIS — G309 Alzheimer's disease, unspecified: Secondary | ICD-10-CM | POA: Diagnosis not present

## 2018-09-15 DIAGNOSIS — E785 Hyperlipidemia, unspecified: Secondary | ICD-10-CM | POA: Diagnosis not present

## 2018-09-15 DIAGNOSIS — I2510 Atherosclerotic heart disease of native coronary artery without angina pectoris: Secondary | ICD-10-CM | POA: Diagnosis not present

## 2018-09-15 DIAGNOSIS — L03113 Cellulitis of right upper limb: Secondary | ICD-10-CM | POA: Diagnosis not present

## 2018-09-16 DIAGNOSIS — I2510 Atherosclerotic heart disease of native coronary artery without angina pectoris: Secondary | ICD-10-CM | POA: Diagnosis not present

## 2018-09-16 DIAGNOSIS — G309 Alzheimer's disease, unspecified: Secondary | ICD-10-CM | POA: Diagnosis not present

## 2018-09-16 DIAGNOSIS — I358 Other nonrheumatic aortic valve disorders: Secondary | ICD-10-CM | POA: Diagnosis not present

## 2018-09-16 DIAGNOSIS — E785 Hyperlipidemia, unspecified: Secondary | ICD-10-CM | POA: Diagnosis not present

## 2018-09-16 DIAGNOSIS — L03113 Cellulitis of right upper limb: Secondary | ICD-10-CM | POA: Diagnosis not present

## 2018-09-16 DIAGNOSIS — F0281 Dementia in other diseases classified elsewhere with behavioral disturbance: Secondary | ICD-10-CM | POA: Diagnosis not present

## 2018-09-18 DIAGNOSIS — E785 Hyperlipidemia, unspecified: Secondary | ICD-10-CM | POA: Diagnosis not present

## 2018-09-18 DIAGNOSIS — I2510 Atherosclerotic heart disease of native coronary artery without angina pectoris: Secondary | ICD-10-CM | POA: Diagnosis not present

## 2018-09-18 DIAGNOSIS — G309 Alzheimer's disease, unspecified: Secondary | ICD-10-CM | POA: Diagnosis not present

## 2018-09-18 DIAGNOSIS — F0281 Dementia in other diseases classified elsewhere with behavioral disturbance: Secondary | ICD-10-CM | POA: Diagnosis not present

## 2018-09-18 DIAGNOSIS — L03113 Cellulitis of right upper limb: Secondary | ICD-10-CM | POA: Diagnosis not present

## 2018-09-18 DIAGNOSIS — I358 Other nonrheumatic aortic valve disorders: Secondary | ICD-10-CM | POA: Diagnosis not present

## 2018-09-20 DIAGNOSIS — G309 Alzheimer's disease, unspecified: Secondary | ICD-10-CM | POA: Diagnosis not present

## 2018-09-20 DIAGNOSIS — E785 Hyperlipidemia, unspecified: Secondary | ICD-10-CM | POA: Diagnosis not present

## 2018-09-20 DIAGNOSIS — F0281 Dementia in other diseases classified elsewhere with behavioral disturbance: Secondary | ICD-10-CM | POA: Diagnosis not present

## 2018-09-20 DIAGNOSIS — I2510 Atherosclerotic heart disease of native coronary artery without angina pectoris: Secondary | ICD-10-CM | POA: Diagnosis not present

## 2018-09-20 DIAGNOSIS — L03113 Cellulitis of right upper limb: Secondary | ICD-10-CM | POA: Diagnosis not present

## 2018-09-20 DIAGNOSIS — I358 Other nonrheumatic aortic valve disorders: Secondary | ICD-10-CM | POA: Diagnosis not present

## 2018-09-24 DIAGNOSIS — L03113 Cellulitis of right upper limb: Secondary | ICD-10-CM | POA: Diagnosis not present

## 2018-09-24 DIAGNOSIS — F0281 Dementia in other diseases classified elsewhere with behavioral disturbance: Secondary | ICD-10-CM | POA: Diagnosis not present

## 2018-09-24 DIAGNOSIS — E785 Hyperlipidemia, unspecified: Secondary | ICD-10-CM | POA: Diagnosis not present

## 2018-09-24 DIAGNOSIS — G309 Alzheimer's disease, unspecified: Secondary | ICD-10-CM | POA: Diagnosis not present

## 2018-09-24 DIAGNOSIS — I2510 Atherosclerotic heart disease of native coronary artery without angina pectoris: Secondary | ICD-10-CM | POA: Diagnosis not present

## 2018-09-24 DIAGNOSIS — I358 Other nonrheumatic aortic valve disorders: Secondary | ICD-10-CM | POA: Diagnosis not present

## 2018-10-02 DEATH — deceased

## 2019-05-24 IMAGING — CT CT HEAD W/O CM
4 series · 17 of 47 positions shown, 19 images · non-contrast
Comparison: Head CT 02/24/2016

CLINICAL DATA: Altered mental status

EXAM:
CT HEAD WITHOUT CONTRAST
TECHNIQUE: Contiguous axial images were obtained from the base of the skull
through the vertex without intravenous contrast.

[Series 3: head without · axial · non-contrast · 0.40mm/px · z∈[+1186,+1306]mm · 7 of 34 slices shown, 9 images]
[im 5/34  brain]
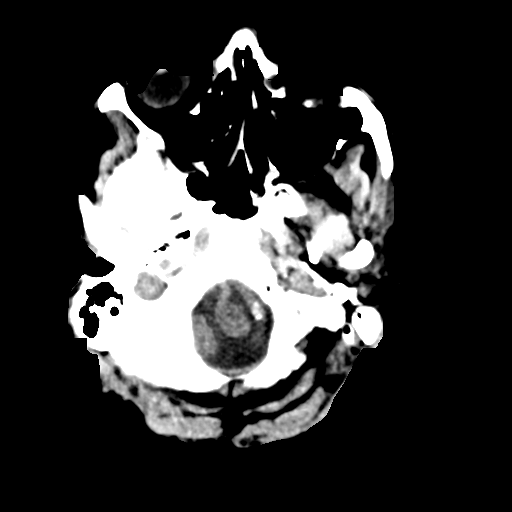
[im 5/34  bone]
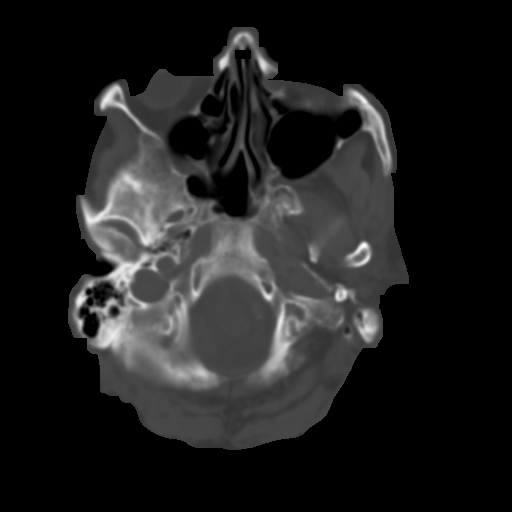
[im 9/34  brain]
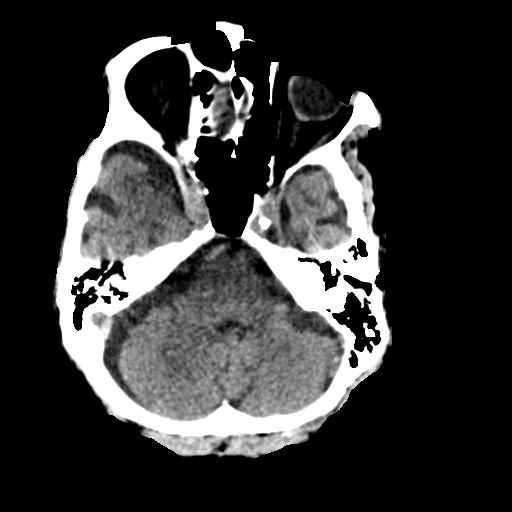
[im 13/34  brain]
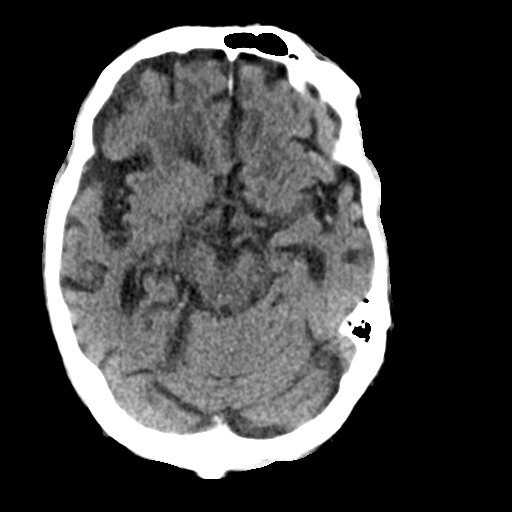
[im 17/34  brain]
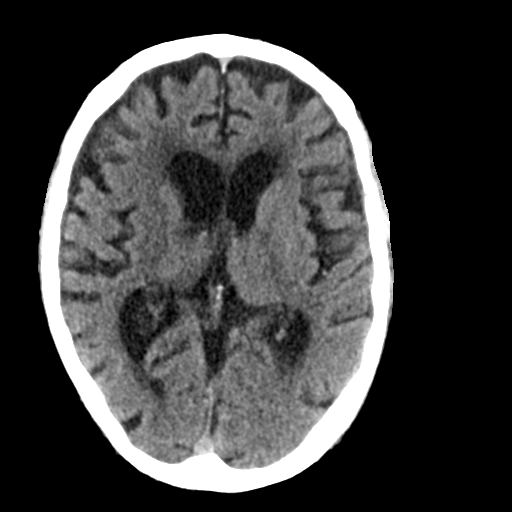
[im 21/34  brain]
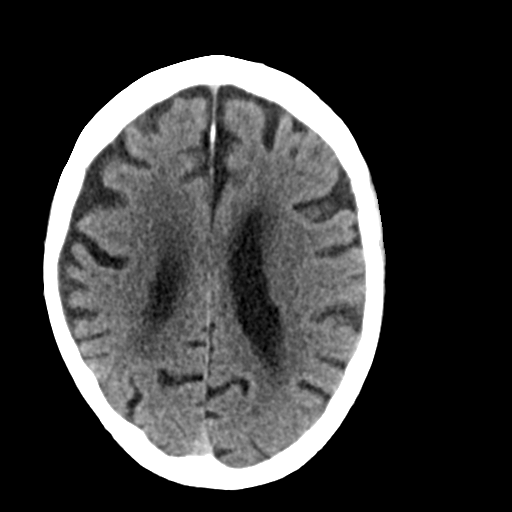
[im 21/34  bone]
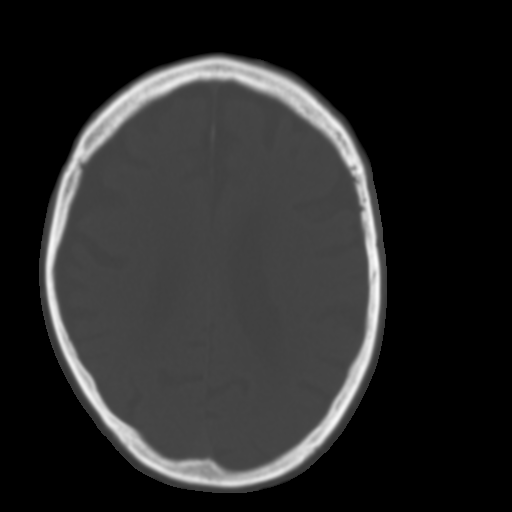
[im 25/34  brain]
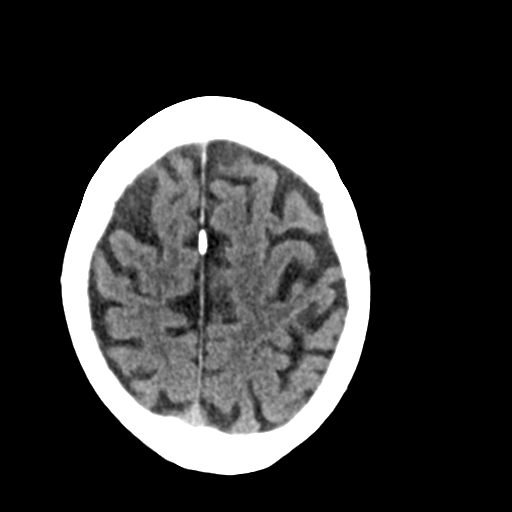
[im 29/34  brain]
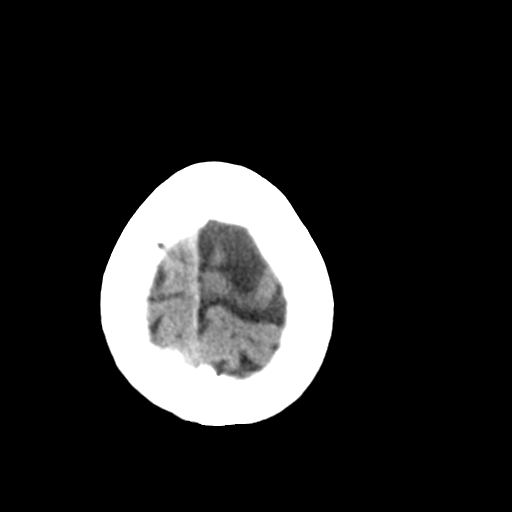

[Series 4: head bone · axial · 0.40mm/px · z∈[+1182,+1240]mm · 4 of 85 slices shown]
[im 9/85  bone]
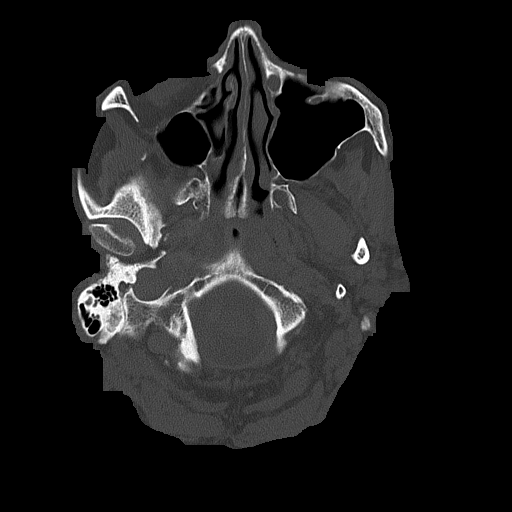
[im 17/85  bone]
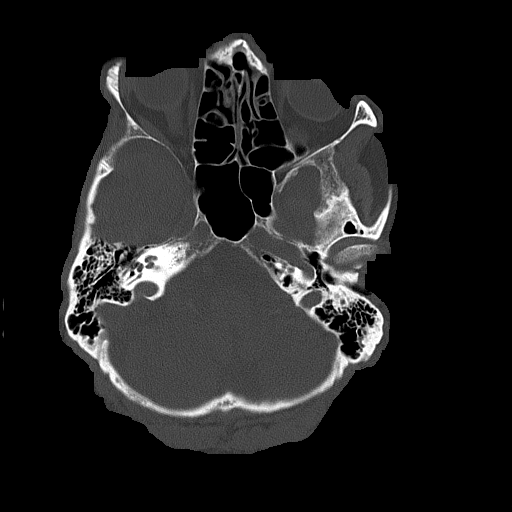
[im 26/85  bone]
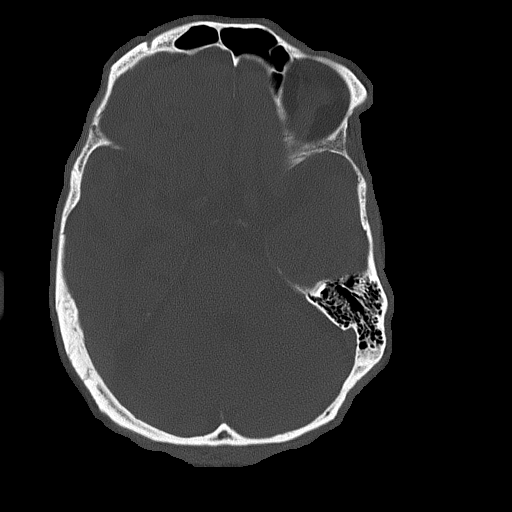
[im 38/85  bone]
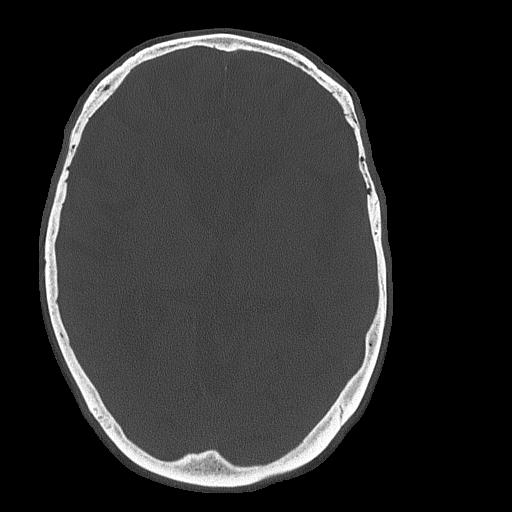

[Series 5: head without cor · coronal · non-contrast · 0.32mm/px · 3 of 68 slices shown]
[im 23/68  brain]
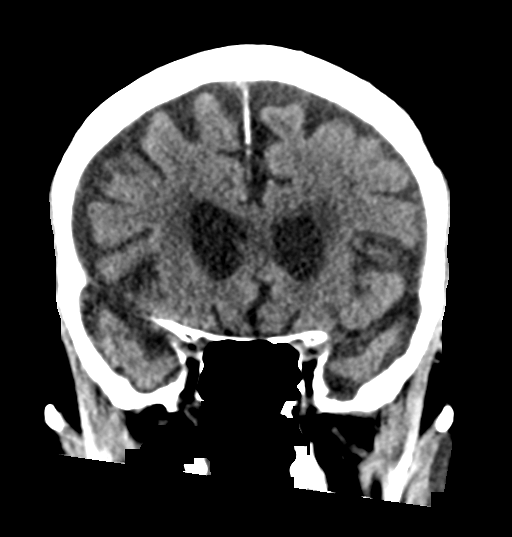
[im 30/68  brain]
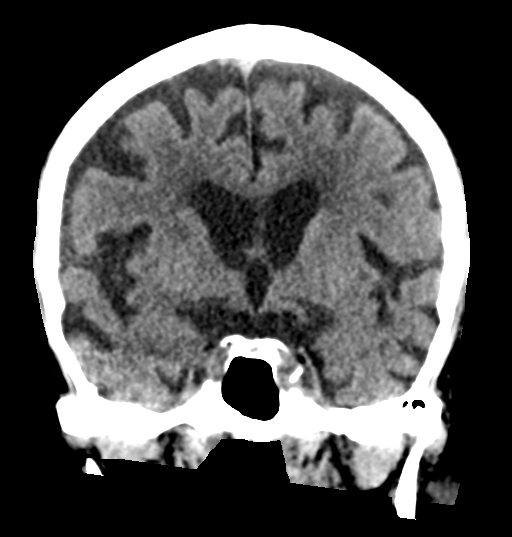
[im 38/68  brain]
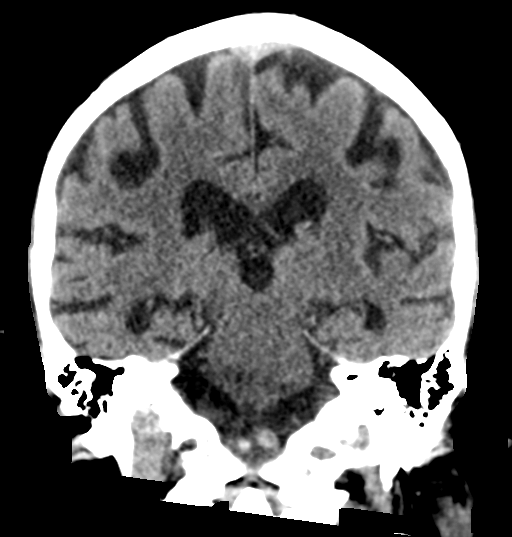

[Series 6: head without sag · sagittal · non-contrast · 0.33mm/px · 3 of 54 slices shown]
[im 19/54  brain]
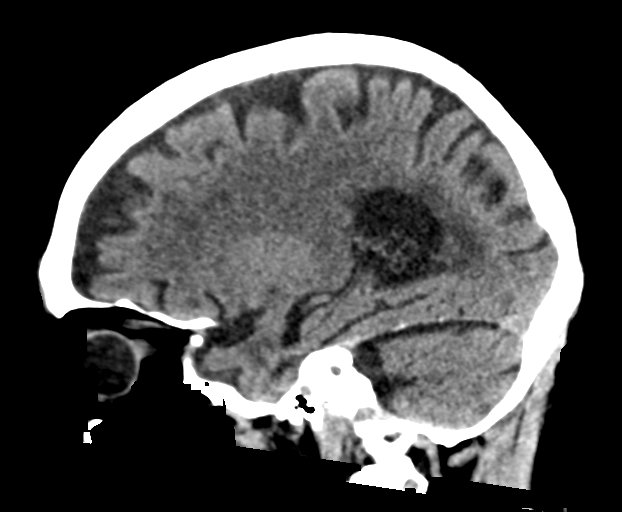
[im 28/54  brain]
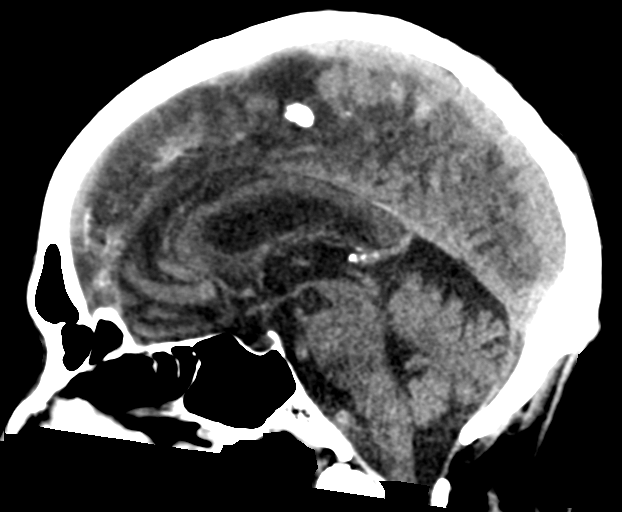
[im 36/54  brain]
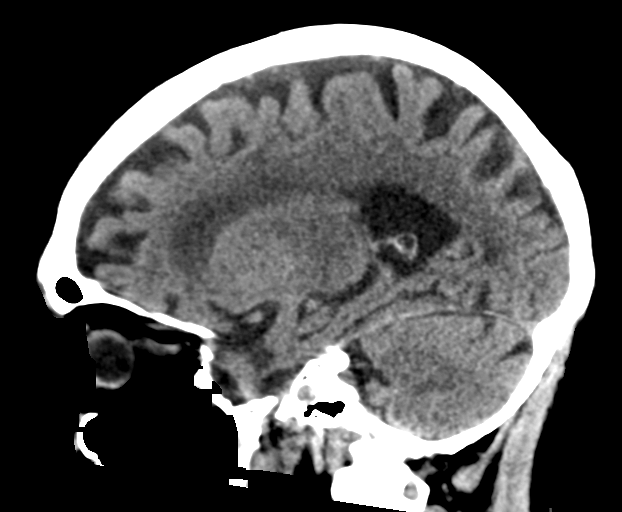

[17 of 47 positions shown; findings below may reference images not displayed]

FINDINGS: Brain: No mass lesion, intraparenchymal hemorrhage or extra-axial
collection. No evidence of acute cortical infarct. There is
periventricular hypoattenuation compatible with chronic
microvascular disease. Diffuse moderate atrophy.

Vascular: No hyperdense vessel or unexpected calcification.

Skull: Normal visualized skull base, calvarium and extracranial soft
tissues.

Sinuses/Orbits: No sinus fluid levels or advanced mucosal
thickening. No mastoid effusion. Normal orbits.
IMPRESSION: Moderate atrophy and sequelae of chronic ischemic microangiopathy
without acute abnormality.
